# Patient Record
Sex: Female | Born: 1980 | Race: White | Hispanic: No | Smoking: Never smoker
Health system: Southern US, Community
[De-identification: ages and names within clinical notes are randomized; demographics above are authoritative.]

## PROBLEM LIST (undated history)

## (undated) DIAGNOSIS — M5136 Other intervertebral disc degeneration, lumbar region: Secondary | ICD-10-CM

## (undated) DIAGNOSIS — M51369 Other intervertebral disc degeneration, lumbar region without mention of lumbar back pain or lower extremity pain: Secondary | ICD-10-CM

## (undated) DIAGNOSIS — C539 Malignant neoplasm of cervix uteri, unspecified: Secondary | ICD-10-CM

## (undated) DIAGNOSIS — Z90711 Acquired absence of uterus with remaining cervical stump: Secondary | ICD-10-CM

## (undated) DIAGNOSIS — F319 Bipolar disorder, unspecified: Secondary | ICD-10-CM

## (undated) DIAGNOSIS — J449 Chronic obstructive pulmonary disease, unspecified: Secondary | ICD-10-CM

## (undated) DIAGNOSIS — N83299 Other ovarian cyst, unspecified side: Secondary | ICD-10-CM

## (undated) DIAGNOSIS — G5601 Carpal tunnel syndrome, right upper limb: Secondary | ICD-10-CM

## (undated) DIAGNOSIS — F4310 Post-traumatic stress disorder, unspecified: Secondary | ICD-10-CM

## (undated) DIAGNOSIS — T7840XA Allergy, unspecified, initial encounter: Secondary | ICD-10-CM

## (undated) DIAGNOSIS — N189 Chronic kidney disease, unspecified: Secondary | ICD-10-CM

## (undated) DIAGNOSIS — F329 Major depressive disorder, single episode, unspecified: Secondary | ICD-10-CM

## (undated) DIAGNOSIS — F419 Anxiety disorder, unspecified: Secondary | ICD-10-CM

## (undated) DIAGNOSIS — M722 Plantar fascial fibromatosis: Secondary | ICD-10-CM

## (undated) DIAGNOSIS — F32A Depression, unspecified: Secondary | ICD-10-CM

## (undated) DIAGNOSIS — G35 Multiple sclerosis: Secondary | ICD-10-CM

## (undated) DIAGNOSIS — M069 Rheumatoid arthritis, unspecified: Secondary | ICD-10-CM

## (undated) DIAGNOSIS — G8929 Other chronic pain: Secondary | ICD-10-CM

## (undated) DIAGNOSIS — M5126 Other intervertebral disc displacement, lumbar region: Secondary | ICD-10-CM

## (undated) DIAGNOSIS — I10 Essential (primary) hypertension: Secondary | ICD-10-CM

## (undated) DIAGNOSIS — M549 Dorsalgia, unspecified: Secondary | ICD-10-CM

## (undated) DIAGNOSIS — G43909 Migraine, unspecified, not intractable, without status migrainosus: Secondary | ICD-10-CM

## (undated) DIAGNOSIS — N289 Disorder of kidney and ureter, unspecified: Secondary | ICD-10-CM

## (undated) DIAGNOSIS — R87629 Unspecified abnormal cytological findings in specimens from vagina: Secondary | ICD-10-CM

## (undated) DIAGNOSIS — N760 Acute vaginitis: Principal | ICD-10-CM

## (undated) DIAGNOSIS — B9689 Other specified bacterial agents as the cause of diseases classified elsewhere: Secondary | ICD-10-CM

## (undated) HISTORY — DX: Allergy, unspecified, initial encounter: T78.40XA

## (undated) HISTORY — PX: EYE SURGERY: SHX253

## (undated) HISTORY — DX: Unspecified abnormal cytological findings in specimens from vagina: R87.629

## (undated) HISTORY — DX: Chronic kidney disease, unspecified: N18.9

## (undated) HISTORY — DX: Multiple sclerosis: G35

## (undated) HISTORY — DX: Other intervertebral disc degeneration, lumbar region without mention of lumbar back pain or lower extremity pain: M51.369

## (undated) HISTORY — DX: Acute vaginitis: N76.0

## (undated) HISTORY — DX: Disorder of kidney and ureter, unspecified: N28.9

## (undated) HISTORY — DX: Carpal tunnel syndrome, right upper limb: G56.01

## (undated) HISTORY — PX: ABDOMINAL HYSTERECTOMY: SHX81

## (undated) HISTORY — DX: Other intervertebral disc displacement, lumbar region: M51.26

## (undated) HISTORY — PX: OTHER SURGICAL HISTORY: SHX169

## (undated) HISTORY — DX: Other specified bacterial agents as the cause of diseases classified elsewhere: B96.89

## (undated) HISTORY — DX: Other intervertebral disc degeneration, lumbar region: M51.36

---

## 2001-03-02 ENCOUNTER — Emergency Department (HOSPITAL_COMMUNITY): Admission: EM | Admit: 2001-03-02 | Discharge: 2001-03-02 | Payer: Self-pay | Admitting: Emergency Medicine

## 2001-09-11 ENCOUNTER — Emergency Department (HOSPITAL_COMMUNITY): Admission: EM | Admit: 2001-09-11 | Discharge: 2001-09-12 | Payer: Self-pay | Admitting: Internal Medicine

## 2002-01-09 ENCOUNTER — Emergency Department (HOSPITAL_COMMUNITY): Admission: EM | Admit: 2002-01-09 | Discharge: 2002-01-09 | Payer: Self-pay | Admitting: Emergency Medicine

## 2002-07-24 ENCOUNTER — Emergency Department (HOSPITAL_COMMUNITY): Admission: EM | Admit: 2002-07-24 | Discharge: 2002-07-24 | Payer: Self-pay | Admitting: Emergency Medicine

## 2002-08-28 ENCOUNTER — Ambulatory Visit (HOSPITAL_COMMUNITY): Admission: AD | Admit: 2002-08-28 | Discharge: 2002-08-28 | Payer: Self-pay | Admitting: Obstetrics and Gynecology

## 2002-09-07 ENCOUNTER — Ambulatory Visit (HOSPITAL_COMMUNITY): Admission: AD | Admit: 2002-09-07 | Discharge: 2002-09-07 | Payer: Self-pay | Admitting: Obstetrics and Gynecology

## 2002-09-08 ENCOUNTER — Ambulatory Visit (HOSPITAL_COMMUNITY): Admission: AD | Admit: 2002-09-08 | Discharge: 2002-09-08 | Payer: Self-pay | Admitting: Obstetrics and Gynecology

## 2002-09-11 ENCOUNTER — Ambulatory Visit (HOSPITAL_COMMUNITY): Admission: AD | Admit: 2002-09-11 | Discharge: 2002-09-11 | Payer: Self-pay | Admitting: Obstetrics and Gynecology

## 2002-09-17 ENCOUNTER — Ambulatory Visit (HOSPITAL_COMMUNITY): Admission: RE | Admit: 2002-09-17 | Discharge: 2002-09-17 | Payer: Self-pay | Admitting: Obstetrics and Gynecology

## 2002-09-23 ENCOUNTER — Ambulatory Visit (HOSPITAL_COMMUNITY): Admission: AD | Admit: 2002-09-23 | Discharge: 2002-09-23 | Payer: Self-pay | Admitting: Obstetrics and Gynecology

## 2002-09-26 ENCOUNTER — Ambulatory Visit (HOSPITAL_COMMUNITY): Admission: AD | Admit: 2002-09-26 | Discharge: 2002-09-26 | Payer: Self-pay | Admitting: Obstetrics and Gynecology

## 2002-09-30 ENCOUNTER — Ambulatory Visit (HOSPITAL_COMMUNITY): Admission: AD | Admit: 2002-09-30 | Discharge: 2002-09-30 | Payer: Self-pay | Admitting: Obstetrics and Gynecology

## 2002-10-13 ENCOUNTER — Observation Stay (HOSPITAL_COMMUNITY): Admission: AD | Admit: 2002-10-13 | Discharge: 2002-10-14 | Payer: Self-pay | Admitting: Obstetrics and Gynecology

## 2002-10-14 ENCOUNTER — Ambulatory Visit (HOSPITAL_COMMUNITY): Admission: AD | Admit: 2002-10-14 | Discharge: 2002-10-15 | Payer: Self-pay | Admitting: Obstetrics and Gynecology

## 2002-10-17 ENCOUNTER — Ambulatory Visit (HOSPITAL_COMMUNITY): Admission: AD | Admit: 2002-10-17 | Discharge: 2002-10-17 | Payer: Self-pay | Admitting: Obstetrics and Gynecology

## 2002-10-20 ENCOUNTER — Inpatient Hospital Stay (HOSPITAL_COMMUNITY): Admission: AD | Admit: 2002-10-20 | Discharge: 2002-10-22 | Payer: Self-pay | Admitting: Obstetrics and Gynecology

## 2003-06-17 ENCOUNTER — Emergency Department (HOSPITAL_COMMUNITY): Admission: EM | Admit: 2003-06-17 | Discharge: 2003-06-17 | Payer: Self-pay | Admitting: Emergency Medicine

## 2003-07-18 ENCOUNTER — Emergency Department (HOSPITAL_COMMUNITY): Admission: EM | Admit: 2003-07-18 | Discharge: 2003-07-18 | Payer: Self-pay | Admitting: *Deleted

## 2003-11-17 ENCOUNTER — Emergency Department (HOSPITAL_COMMUNITY): Admission: EM | Admit: 2003-11-17 | Discharge: 2003-11-17 | Payer: Self-pay | Admitting: *Deleted

## 2004-01-31 ENCOUNTER — Observation Stay (HOSPITAL_COMMUNITY): Admission: AD | Admit: 2004-01-31 | Discharge: 2004-02-01 | Payer: Self-pay | Admitting: Obstetrics and Gynecology

## 2004-02-15 ENCOUNTER — Inpatient Hospital Stay (HOSPITAL_COMMUNITY): Admission: AD | Admit: 2004-02-15 | Discharge: 2004-02-17 | Payer: Self-pay | Admitting: Obstetrics and Gynecology

## 2004-03-28 ENCOUNTER — Ambulatory Visit (HOSPITAL_COMMUNITY): Admission: RE | Admit: 2004-03-28 | Discharge: 2004-03-28 | Payer: Self-pay | Admitting: Obstetrics and Gynecology

## 2004-03-28 ENCOUNTER — Emergency Department (HOSPITAL_COMMUNITY): Admission: EM | Admit: 2004-03-28 | Discharge: 2004-03-28 | Payer: Self-pay | Admitting: Emergency Medicine

## 2004-05-05 ENCOUNTER — Emergency Department (HOSPITAL_COMMUNITY): Admission: EM | Admit: 2004-05-05 | Discharge: 2004-05-05 | Payer: Self-pay | Admitting: Emergency Medicine

## 2005-06-06 ENCOUNTER — Other Ambulatory Visit: Admission: RE | Admit: 2005-06-06 | Discharge: 2005-06-06 | Payer: Self-pay

## 2006-10-03 ENCOUNTER — Ambulatory Visit (HOSPITAL_COMMUNITY): Admission: RE | Admit: 2006-10-03 | Discharge: 2006-10-03 | Payer: Self-pay | Admitting: Family Medicine

## 2006-10-31 ENCOUNTER — Emergency Department (HOSPITAL_COMMUNITY): Admission: EM | Admit: 2006-10-31 | Discharge: 2006-10-31 | Payer: Self-pay | Admitting: Emergency Medicine

## 2008-07-15 ENCOUNTER — Emergency Department (HOSPITAL_COMMUNITY): Admission: EM | Admit: 2008-07-15 | Discharge: 2008-07-15 | Payer: Self-pay | Admitting: Emergency Medicine

## 2008-09-16 ENCOUNTER — Emergency Department (HOSPITAL_COMMUNITY): Admission: EM | Admit: 2008-09-16 | Discharge: 2008-09-16 | Payer: Self-pay | Admitting: Emergency Medicine

## 2009-10-11 ENCOUNTER — Emergency Department (HOSPITAL_COMMUNITY): Admission: EM | Admit: 2009-10-11 | Discharge: 2009-10-12 | Payer: Self-pay | Admitting: Emergency Medicine

## 2009-11-15 ENCOUNTER — Ambulatory Visit: Admission: RE | Admit: 2009-11-15 | Discharge: 2009-11-15 | Payer: Self-pay | Admitting: Family Medicine

## 2009-11-23 DIAGNOSIS — J45909 Unspecified asthma, uncomplicated: Secondary | ICD-10-CM | POA: Insufficient documentation

## 2009-11-26 ENCOUNTER — Ambulatory Visit: Payer: Self-pay | Admitting: Cardiovascular Disease

## 2009-11-26 DIAGNOSIS — R0602 Shortness of breath: Secondary | ICD-10-CM | POA: Insufficient documentation

## 2009-11-26 DIAGNOSIS — R0609 Other forms of dyspnea: Secondary | ICD-10-CM | POA: Insufficient documentation

## 2009-11-30 ENCOUNTER — Encounter: Payer: Self-pay | Admitting: Cardiology

## 2009-12-06 ENCOUNTER — Ambulatory Visit (HOSPITAL_COMMUNITY): Admission: RE | Admit: 2009-12-06 | Discharge: 2009-12-06 | Payer: Self-pay | Admitting: Cardiovascular Disease

## 2009-12-06 ENCOUNTER — Telehealth (INDEPENDENT_AMBULATORY_CARE_PROVIDER_SITE_OTHER): Payer: Self-pay | Admitting: *Deleted

## 2009-12-09 ENCOUNTER — Ambulatory Visit: Payer: Self-pay | Admitting: Cardiology

## 2009-12-09 ENCOUNTER — Encounter (HOSPITAL_COMMUNITY): Admission: RE | Admit: 2009-12-09 | Discharge: 2010-01-08 | Payer: Self-pay | Admitting: Cardiovascular Disease

## 2009-12-13 ENCOUNTER — Encounter: Payer: Self-pay | Admitting: Cardiovascular Disease

## 2010-09-20 NOTE — Letter (Signed)
Summary: [facility]  [facility]   Imported By: Faythe Ghee 11/30/2009 13:20:37  _____________________________________________________________________  External Attachment:    Type:   Image     Comment:   External Document

## 2010-09-20 NOTE — Letter (Signed)
Summary: [facility] PROGRESS NOTE  [facility] PROGRESS NOTE   Imported By: Faythe Ghee 11/30/2009 13:19:27  _____________________________________________________________________  External Attachment:    Type:   Image     Comment:   External Document

## 2010-09-20 NOTE — Letter (Signed)
Summary: [place] Results Engineer, agricultural at [facility]  [address] [place] [address]   Phone: [phone]  Fax: [phone]      December 13, 2009 MRN: [id number]   Mary [name] [address] [place], [place]  [address]   Dear [name],  Your test ordered by Selena Batten has been reviewed by your physician (or physician assistant) and was found to be normal or stable. Your physician (or physician assistant) felt no changes were needed at this time.  ____ Echocardiogram  __X__ Cardiac Stress Test  ____ Lab Work  ____ Peripheral vascular study of arms, legs or neck  ____ CT scan or X-ray  ____ Lung or Breathing test  ____ Other: Please continue on current medical treatment.  Thank you.   Charlton Haws, MD, F.A.C.C

## 2010-09-20 NOTE — Progress Notes (Signed)
Summary: Test results  Phone Note Call from Patient   Caller: Patient Reason for Call: Talk to Nurse Action Taken: Phone Call Completed Details for Reason: calling to get results from test this morning. Details of Action Taken: sent note to [facility], LPN Summary of Call: Mary [name] called in to get resuts from test that was completed this morning. Initial call taken by: Alexis Goodell     Appended Document: Test results    Phone Note Outgoing Call   Call placed by: Larita Fife Via LPN,  December 07, 2009 9:12 AM Summary of Call: Pt. advised and test results sent to Dr. Christell Constant.

## 2010-09-20 NOTE — Letter (Signed)
Summary: [place] [facility] (Nuc Med Stress)  [facility]  [address] [place] [address]   Phone: [phone]  Fax: [phone]    Nuclear Medicine 1-Day Stress Test Information Sheet  Re:     Mary [name]   DOB:     [birth year] MRN:     [id number] Weight:  Appointment Date: Register at: Appointment Time: Referring MD:  _X__Exercise Stress  __Adenosine   __Dobutamine  __Lexiscan  __Persantine   __Thallium  Urgency: ____1 (next day)   ____2 (one week)    ____3 (PRN)  Patient will receive Follow Up call with results: Patient needs follow-up appointment:  Instructions regarding medication:  How to prepare for your stress test: 1. DO NOT eat or dring 6 hours prior to your arrival time. This includes no caffeine (coffee, tea, sodas, chocolate) if you were instructed to take your medications, drink water with it. 2. DO NOT use any tobacco products for at leaset 8 hours prior to arrival. 3. DO NOT wear dresses or any clothing that may have metal clasps or buttons. 4. Wear short sleeve shirts, loose clothing, and comfortalbe walking shoes. 5. DO NOT use lotions, oils or powder on your chest before the test. 6. The test will take approximately 3-4 hours from the time you arrive until completion. 7. To register the day of the test, go to the Short Stay entrance at [facility]. 8. If you must cancel your test, call [phone] as soon as you are aware.  After you arrive for test:   When you arrive at [facility], you will go to Short Stay to be registered. They will then send you to Radiology to check in. The Nuclear Medicine Tech will get you and start an IV in your arm or hand. A small amount of a radioactive tracer will then be injected into your IV. This tracer will then have to circulate for 30-45 minutes. During this time you will wait in the waiting room and you will be able to drink something without caffeine. A series of pictures will be  taken of your heart follwoing this waiting period. After the 1st set of pictures you will go to the stress lab to get ready for your stress test. During the stress test, another small amount of a radioactive tracer will be injected through your IV. When the stress test is complete, there is a short rest period while your heart rate and blood pressure will be monitored. When this monitoring period is complete you will have another set of pictrues taken. (The same as the 1st set of pictures). These pictures are taken between 15 minutes and 1 hour after the stress test. The time depends on the type of stress test you had. Your doctor will inform you of your test results within 7 days after test.    The possibilities of certain changes are possible during the test. They include abnormal blood pressure and disorders of the heart. Side effects of persantine or adenosine can include flushing, chest pain, shortness of breath, stomach tightness, headache and light-headedness. These side effects usually do not last long and are self-resolving. Every effort will be made to keep you comfortable and to minimize complications by obtaining a medical history and by close observation during the test. Emergency equipment, medications, and trained personnel are available to deal with any unusual situation which may arise.  Please notify office at least 48 hours in advance if you are unable to keep  this appt.

## 2010-09-20 NOTE — Assessment & Plan Note (Signed)
Summary: **np3 chest pain   Visit Type:  Initial Consult Primary Provider:  Dr.donald moore  CC:  chest pain.  History of Present Illness: Mary [name] is seen today at the request of Dr Buel Ream office.  She has had atypical SSCP for a few months.  The pain is sharp in center of her chest, occurs at rest and not necessarily walking.  She had the pain prior to her hysterectomy 4 weeks ago at [facility].  It is persistant since surgery except the last 3 days.  She has longstanding asthma which is not necessarily worse with the chest pain.  She indicates that her ECG was abnormal at Dr Buel Ream office but she had her surgery anyway.  ECG in the office today shows NSR with nonspecific ST/T wave changes.    ECG:  NSR 65 Nonspecific ST/T wave changes Abnormal ECG  Current Problems (verified): 1)  Dyspnea  (ICD-786.05) 2)  R/O Chest Pain Unspecified  (ICD-786.50) 3)  Asthma  (ICD-493.90)  Current Medications (verified): 1)  Advair Diskus 250-50 Mcg/dose Aepb (Fluticasone-Salmeterol) .Marland Kitchen.. 1 Puff Two Times A Day 2)  Ventolin Hfa 108 (90 Base) Mcg/act Aers (Albuterol Sulfate) .... 2 Puffs Qid 3)  Singulair 10 Mg Tabs (Montelukast Sodium) .... Take 1 Tab Daily 4)  Alprazolam 1 Mg Tabs (Alprazolam) .... Take 1/2 Tab Two Times A Day 5)  Vitamin D3 400 Unit Tabs (Cholecalciferol) .... Take 1 Tab Daily 6)  Bupropion Hcl 150 Mg Xr12h-Tab (Bupropion Hcl) .... Take 1 Tab Two Times A Day 7)  Clarinex 5 Mg Tabs (Desloratadine) .... Take 1 Tab Daily 8)  Proair Hfa 108 (90 Base) Mcg/act Aers (Albuterol Sulfate) .... Prn  Allergies (verified): 1)  ! Penicillin  Past History:  Past Medical History: Last updated: 11/23/2009 Current Problems:  ASTHMA (ICD-493.90)  Past Surgical History: Last updated: 11/23/2009 Artificial Right Eye 2 normal deliveries 1 boy 1 girl  Family History: Last updated: 11/26/2009 non-contributory  Social History: Last updated:  11/26/2009 Sedentary Nonsmoker Nondrinker Single  Family History: non-contributory  Social History: Sedentary Nonsmoker Nondrinker Single  Review of Systems       Denies fever, malais, weight loss, blurry vision, decreased visual acuity, cough, sputum, SOB, hemoptysis, pleuritic pain, palpitaitons, heartburn, abdominal pain, melena, lower extremity edema, claudication, or rash.   Vital Signs:  Patient profile:   30 year old female Height:      67 inches Weight:      274 pounds BMI:     43.07 Pulse rate:   66 / minute BP sitting:   126 / 87  (right arm)  Vitals Entered By: Dreama Saa, CNA (November 26, 2009 3:52 PM)  Physical Exam  General:  Affect appropriate Healthy:  appears stated age HEENT: normal Neck supple with no adenopathy JVP normal no bruits no thyromegaly Lungs clear with no wheezing and good diaphragmatic motion Heart:  S1/S2 no murmur,rub, gallop or click PMI normal Abdomen: benighn, BS positve, no tenderness, no AAA no bruit.  No HSM or HJR Distal pulses intact with no bruits No edema Neuro non-focal Skin warm and dry Right eye enucleatoin with prosthesis   Impression & Recommendations:  Problem # 1:  DYSPNEA (ICD-786.05)  Obesity with asthma.  Check CT R/O PE post Ob/Gyn surgery with SSCP and SOB  Orders: Nuclear Stress Test (Nuc Stress Test)  Problem # 2:  R/O CHEST PAIN UNSPECIFIED (ICD-786.50)  Atypical with abnormal ECG.  Stess Myovue  Orders: Nuclear Stress Test (Nuc Stress Test)  Other Orders:  CT Scan  (CT Scan)  Patient Instructions: 1)  Your physician recommends that you schedule a follow-up appointment in: post test 2)  Your physician has requested that you have an exercise stress myoview.  For further information please visit [email or web address]  Please follow instruction sheet, as given. 3)  Your physician has requested that you have a cardiac CT.  Cardiac computed tomography (CT) is a painless test that uses an x-ray  machine to take clear, detailed pictures of your heart.  For further information please visit [email or web address]  Please follow instruction sheet as given.

## 2010-09-20 NOTE — Letter (Signed)
Summary: [place] Results Engineer, agricultural at [facility]  [address], [place] [address]   Phone: [phone]  Fax: [phone]      December 13, 2009 MRN: [id number]   Mary [name] [address] [place], [place]  [address]   Dear Ms. Moultry,  Your test ordered by Selena Batten has been reviewed by your physician (or physician assistant) and was found to be normal or stable. Your physician (or physician assistant) felt no changes were needed at this time.  ____ Echocardiogram  __X__ Cardiac Stress Test  ____ Lab Work  ____ Peripheral vascular study of arms, legs or neck  ____ CT scan or X-ray  ____ Lung or Breathing test  ____ Other: Please continue with current medical treatment.   Thank you.   Charlton Haws, MD, F.A.C.C

## 2010-09-20 NOTE — Letter (Signed)
Summary: [facility]S [facility].11 [facility]  [facility]   Imported By: Faythe Ghee 11/30/2009 13:20:08  _____________________________________________________________________  External Attachment:    Type:   Image     Comment:   External Document

## 2010-10-25 ENCOUNTER — Other Ambulatory Visit (HOSPITAL_COMMUNITY): Payer: Self-pay | Admitting: *Deleted

## 2010-10-25 DIAGNOSIS — N6452 Nipple discharge: Secondary | ICD-10-CM

## 2010-10-25 DIAGNOSIS — R52 Pain, unspecified: Secondary | ICD-10-CM

## 2010-10-26 ENCOUNTER — Ambulatory Visit (HOSPITAL_COMMUNITY)
Admission: RE | Admit: 2010-10-26 | Discharge: 2010-10-26 | Disposition: A | Discharge status: Home / Self Care | Payer: PRIVATE HEALTH INSURANCE | Source: Ambulatory Visit | Attending: Family Medicine | Admitting: Family Medicine

## 2010-10-26 ENCOUNTER — Encounter (HOSPITAL_COMMUNITY): Payer: PRIVATE HEALTH INSURANCE

## 2010-10-26 ENCOUNTER — Other Ambulatory Visit (HOSPITAL_COMMUNITY): Payer: Self-pay | Admitting: *Deleted

## 2010-10-26 ENCOUNTER — Ambulatory Visit (HOSPITAL_COMMUNITY)
Admission: RE | Admit: 2010-10-26 | Discharge: 2010-10-26 | Disposition: A | Discharge status: Home / Self Care | Payer: PRIVATE HEALTH INSURANCE | Source: Ambulatory Visit | Attending: *Deleted | Admitting: *Deleted

## 2010-10-26 DIAGNOSIS — R52 Pain, unspecified: Secondary | ICD-10-CM

## 2010-10-26 DIAGNOSIS — N644 Mastodynia: Secondary | ICD-10-CM | POA: Insufficient documentation

## 2010-10-26 DIAGNOSIS — N6459 Other signs and symptoms in breast: Secondary | ICD-10-CM | POA: Insufficient documentation

## 2010-10-26 DIAGNOSIS — N6452 Nipple discharge: Secondary | ICD-10-CM

## 2010-10-31 ENCOUNTER — Other Ambulatory Visit: Payer: Self-pay

## 2010-10-31 DIAGNOSIS — E049 Nontoxic goiter, unspecified: Secondary | ICD-10-CM

## 2010-11-03 ENCOUNTER — Ambulatory Visit (HOSPITAL_COMMUNITY)
Admission: RE | Admit: 2010-11-03 | Discharge: 2010-11-03 | Disposition: A | Discharge status: Home / Self Care | Payer: PRIVATE HEALTH INSURANCE | Source: Ambulatory Visit | Attending: Family Medicine | Admitting: Family Medicine

## 2010-11-03 DIAGNOSIS — Z808 Family history of malignant neoplasm of other organs or systems: Secondary | ICD-10-CM | POA: Insufficient documentation

## 2010-11-03 DIAGNOSIS — R599 Enlarged lymph nodes, unspecified: Secondary | ICD-10-CM | POA: Insufficient documentation

## 2010-11-03 DIAGNOSIS — E049 Nontoxic goiter, unspecified: Secondary | ICD-10-CM | POA: Insufficient documentation

## 2011-01-06 NOTE — Op Note (Signed)
NAME:  Mary [name], Mary [name]                      ACCOUNT NO.:  [id number]   MEDICAL RECORD NO.:  [id number]                   PATIENT TYPE:  INP   LOCATION:  LDR1                                 FACILITY:  [facility]   PHYSICIAN:  Tilda Burrow, M.D.              DATE OF BIRTH:  1980-09-20   DATE OF PROCEDURE:  DATE OF DISCHARGE:                                 OPERATIVE REPORT   LABOR COURSE:  Mary [name] had approximately six minutes of bradycardia with a  fetal heart rate down in the 60s which responded slowly to hands, knee-chest  position.  She had O2 via face mask and IV bolus running plus an  amnioinfusion that had been placed a few minutes prior by Dr. Despina Hidden.  The  fetal heart rate responded to vigorous scalp stimulation and manipulation of  the baby externally.  However, she did continue to have deep repetitive  decelerations.  She was progressing very rapidly with fast descent, and the  cervix was pushed over the baby's head when she was 8 cm dilated probably  less than 5 minutes after arrival to the labor and delivery unit.  Over two  contractions in a knee-chest position, she delivered a viable female infant  at 10:26.  There was a nuchal cord which was reduced.  Apgars were 9 and 9.  Weight 6 pounds 3 ounces.  Pitocin, 20 units, diluted in 1000 cc of lactated  Ringer's was infused rapidly IV.  The placenta separated spontaneously and  was delivered via controlled cord traction at 10:35.  It was inspected and  appeared to be intact with a three-vessel cord.  I forgot to draw blood from  the cord; therefore, cord ABGs will not be available.  The vagina was then  inspected, and no lacerations were found.  Estimated blood loss was 500 cc.  The patient tolerated the delivery very well.     ________________________________________  ___________________________________________  Jacklyn Shell, C.N.M.           Tilda Burrow, M.D.   FC/MEDQ  D:  02/16/2004  T:  02/16/2004   Job:  [id number]   cc:   [facility]

## 2011-01-06 NOTE — H&P (Signed)
NAME:  Mary [name], Mary [name]                      ACCOUNT NO.:  [id number]   MEDICAL RECORD NO.:  [id number]                  PATIENT TYPE:   LOCATION:                                       FACILITY:  [facility]   PHYSICIAN:  Tilda Burrow, M.D.              DATE OF BIRTH:  1981/07/12   DATE OF ADMISSION:  02/15/2004  DATE OF DISCHARGE:                                HISTORY & PHYSICAL   CHIEF COMPLAINT:  Elective induction of labor at term due to extreme  maternal discomfort and fatigue.   HISTORY OF PRESENT ILLNESS:  Mary [name] is a 30 year old gravida 3, para 2 with  an EDC of February 22, 2004, based on her last menstrual period and June 28 and  26, based on first and second trimester ultrasounds, placing her at 61-[redacted]  weeks gestational age.  She began prenatal care early in the first trimester  and has had regular visits.  Total weight gain has been a loss of 2 pounds;  however, she has had appropriate fundal height growth, and she has always  lost some weight during her pregnancies.  Weight today was 243.   Blood pressures 100s-120s/60s-70s.  Blood type A-.  Rubella immune.  HSB,  SAG, HIV, RPR, gonorrhea and Chlamydia are all negative.  GBS was also  negative.  She had a normal one-hour GTT at 74.   Prenatal course has basically been uneventful.  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  We will recheck that three months postpartum.   PAST MEDICAL HISTORY:  1. Has an artificial right eye.  2. Asthma.  Uses inhaler occasionally.   Negative surgical history.   ALLERGIES:  PENICILLIN.   FAMILY HISTORY:  Positive for diabetes, Bell's palsy.   OB HISTORY:  Had a vaginal delivery in 1995 at 40 weeks with an 8 pound, 1  ounce female with an epidural.  In March, 2004 had a vaginal delivery without  an epidural of an 8 pound, 1 ounce female.   PHYSICAL EXAMINATION:  HEENT:  Artificial right eye.  LUNGS:  Clear respirations.  HEART:  Regular rate and rhythm.  ABDOMEN:  Soft and  nontender.  Fundal height 37 cm.  Estimated fetal weight  7-1/2 to 8 pounds.  CERVICAL:  Cervix is 1-2.  Soft.  A -2 station with vertex presentation.  EXTREMITIES:  Legs are trace edema.   IMPRESSION:  Intrauterine pregnancy at 39-40 weeks.  Selective induction of  labor secondary to extreme maternal discomfort.   PLAN:  We will admit to the birthing center on Monday, June 27, for Foley,  preinduction, and planned artificial rupture of membranes of Pitocin in the  morning.  The patient does not plan on epidural at this time.     _____________________________________  ___________________________________________  Jacklyn Shell, C.N.M.        Tilda Burrow, M.D.   FC/MEDQ  D:  02/12/2004  T:  02/12/2004  Job:  [id number]

## 2011-01-06 NOTE — Op Note (Signed)
NAME:  Mary [name], Mary [name]                      ACCOUNT NO.:  [id number]   MEDICAL RECORD NO.:  [id number]                   PATIENT TYPE:  AMB   LOCATION:  DAY                                  FACILITY:  [facility]   PHYSICIAN:  Tilda Burrow, M.D.              DATE OF BIRTH:  Jul 06, 1981   DATE OF PROCEDURE:  03/28/2004  DATE OF DISCHARGE:  03/28/2004                                 OPERATIVE REPORT   PREOPERATIVE DIAGNOSIS:  Elective sterilization.   POSTOPERATIVE DIAGNOSIS:  Elective sterilization.   PROCEDURE:  Laparoscopic tubal sterilization with Falope rings.   SURGEON:  Christin Bach, M.D.   ASSISTANT:  None.   ANESTHESIA:  General.   COMPLICATIONS:  None.   ESTIMATED BLOOD LOSS:  Minimal.   DETAILS OF PROCEDURE:  The patient was taken to the operating room, prepped  and draped in the usual standard fashion with legs in low lithotomy leg  supports after general anesthesia was introduced without difficulty.  The  bladder was in-and-out catheterized and Hulka tenaculum attached to the  cervix for uterine  manipulation.  An infraumbilical, vertical, 1-cm, skin  incision was made as well as a transverse suprapubic 1-cm incision. A Veress  needle was used to achieve pneumoperitoneum through the umbilical incision  while being careful to orient the needle toward the pelvis while elevating  the abdominal wall by manual elevation. Water droplet test was used to  confirm intraperitoneal placement.   Pneumoperitoneum was achieved easily under 8-to-10 mm of intra-abdominal  pressure; and the laparoscopic trocar was introduced, a 5-mm blunt tipped  trocar, under direct visualization using the video camera.  Peritoneal  cavity was entered without difficulty.  Inspection of the anterior surfaces  of the abdominal contents showed no evidence of injury or bleeding.  Attention was directed to the pelvis.  Findings were as described above.   Attention was first directed to the left  fallopian tube which was elevated,  identified to its fimbriated end and grasped in its midportion with Falope  ring applier.  Falope ring applied and then the tube infiltrated with  Marcaine solution 0.25% using a 22-gauge spinal needle percutaneously  applied.   Attention was then directed to the right fallopian tube where a similar  procedure was performed.  Photo documentation of the ring placements was  performed; 120 cc of saline was instilled into the abdomen; deflation of  CO2 performed; instruments removed and subcuticular 4-0 Dexon closure of  skin incisions performed.  The rest of the surgical instruments were  removed; Steri-Strips placed.   The patient was allowed to awaken and go to recovery room in standard  fashion.      ___________________________________________  Tilda Burrow, M.D.   JVF/MEDQ  D:  04/14/2004  T:  04/15/2004  Job:  [id number]

## 2011-01-06 NOTE — H&P (Signed)
[name], [name]                        ACCOUNT NO.:  [id number]   MEDICAL RECORD NO.:  [id number]                  PATIENT TYPE:   LOCATION:                                       FACILITY:   PHYSICIAN:  Jacklyn Shell, C.N.M.    DATE OF BIRTH:   DATE OF ADMISSION:  10/20/2002  DATE OF DISCHARGE:                                HISTORY & PHYSICAL   CHIEF COMPLAINT:  Induction of labor secondary to extreme maternal  discomfort.   HISTORY OF PRESENT ILLNESS:  The patient is a 30 year old gravida 2, para 1,  with an EDC of 10/18/2002 based on first and second trimester ultrasound,  placing her at 40 weeks and 1 day gestational age.  She began prenatal care  at approximately 13 weeks and has have very regular visits throughout.  Blood pressure has ranged 100 to 130s/60s to 80s.  Total weight gain 20  pounds with appropriate fundal height growth.   PRENATAL LABORATORY DATA:  Blood type A negative.  Rubella is immune. RPR,  HBsAg, HIV are all negative. Group B strep is negative.  One-hour GTT was  normal at 67.   The prenatal course has been essentially uneventful except for multiple  visits with complaints of aches, pains, occasional contractions beginning at  approximately 27 weeks without cervical change or need for tocolysis.   HISTORY OF PRESENT ILLNESS:  Positive for headaches, positive for right eye  enucleated as a child with a prosthesis.   PAST SURGICAL HISTORY:  No history of blood transfusion or anesthesia  complications.   PAST OBSTETRICAL HISTORY:  Spontaneous vaginal delivery of 8 pound 11 ounce  female in 1999 at [facility] by Dr. Emelda Fear.   FAMILY HISTORY:  Positive for hypertension, coronary artery disease, and  diabetes.   SOCIAL HISTORY:  Denies smoking, drugs, or alcohol use.  The patient is  unemployed.  The father of the baby is not involved at this time.   PHYSICAL EXAMINATION:  HEENT:  Right eye as stated before.  HEART:  Regular rate  and rhythm.  LUNGS:  Clear.  ABDOMEN:  Soft, nontender.  Fundal height approximately 40 cm.  PELVIC:  Vertex presentation.  Cervix is a type 2 posterior, 60% effaced, -2  station.  EXTREMITIES:  There is 2 to 3+ pretibial edema with 1+ DTRs.   IMPRESSION:  1. Intrauterine pregnancy at 40 weeks.  2. Elective induction of labor due to maternal request because of extreme     maternal discomfort.   Risks and benefits of induction have been explained at length to the patient  including the same risks involved in induction as in spontaneous labor.  She  accepts these risks.   PLAN:  Follow on 10/20/2002 with Pitocin augmentation in the morning.  Jacklyn Shell, C.N.M.    FC/MEDQ  D:  10/20/2002  T:  10/20/2002  Job:  [id number]   cc:   [facility]

## 2011-01-06 NOTE — Op Note (Signed)
   NAME:  CAMBRYN, CHARTERS                      ACCOUNT NO.:  [id number]   MEDICAL RECORD NO.:  [id number]                   PATIENT TYPE:  INP   LOCATION:  LDR2                                 FACILITY:  [facility]   PHYSICIAN:  Jacklyn Shell, C.N.M.    DATE OF BIRTH:  1981-01-31   DATE OF PROCEDURE:  DATE OF DISCHARGE:                                 OPERATIVE REPORT   PROCEDURE:  Delivery.   DESCRIPTION OF PROCEDURE:  Clarissia progressed nicely through labor and had a  sudden irresistible urge to push at approximately 9 cm. As I arrived on  Labor and Delivery, she had an anterior lip which was easily reduced over  one push. After a five minute second stage, she delivered a viable female  infant at 15:38.  A nuchal cord was reduced, the shoulders were somewhat  tight coming out so some axilla traction on the left (anterior) arm was used  rotating the baby into an oblique position which expedited delivery. The  baby was very limp and apneic at delivery so the cord was doubly clamped and  cut in a rapid fashion and taken to the warmer. Apgar are 4 & 9. Please see  resuscitation note on the infants chart. Weight 8 pounds, 2 ounces. 20 units  of Pitocin dilated in 1000 mL of lactated Ringer's was then infused rapidly  IV. The placenta separated spontaneously and was delivered via maternal  pushing effort and controlled cord traction at 15:50. It was inspected and  appears to be intact with a 3 vessel cord. The fundus was firm and bleeding  was minimalized after some fundal massage. Estimated blood loss 400 mL. The  vagina was then inspected and is intact. The patient bonded well and did a  very nice job.                                               Jacklyn Shell, C.N.M.    FC/MEDQ  D:  10/21/2002  T:  10/21/2002  Job:  [id number]

## 2011-02-20 ENCOUNTER — Emergency Department (HOSPITAL_COMMUNITY): Payer: Self-pay

## 2011-02-20 ENCOUNTER — Emergency Department (HOSPITAL_COMMUNITY)
Admission: EM | Admit: 2011-02-20 | Discharge: 2011-02-20 | Disposition: A | Discharge status: Home / Self Care | Payer: Self-pay | Attending: Emergency Medicine | Admitting: Emergency Medicine

## 2011-02-20 DIAGNOSIS — G43909 Migraine, unspecified, not intractable, without status migrainosus: Secondary | ICD-10-CM | POA: Insufficient documentation

## 2011-02-20 DIAGNOSIS — Z79899 Other long term (current) drug therapy: Secondary | ICD-10-CM | POA: Insufficient documentation

## 2011-02-20 DIAGNOSIS — Z970 Presence of artificial eye: Secondary | ICD-10-CM | POA: Insufficient documentation

## 2011-02-20 DIAGNOSIS — F3289 Other specified depressive episodes: Secondary | ICD-10-CM | POA: Insufficient documentation

## 2011-02-20 DIAGNOSIS — Z8541 Personal history of malignant neoplasm of cervix uteri: Secondary | ICD-10-CM | POA: Insufficient documentation

## 2011-02-20 DIAGNOSIS — F329 Major depressive disorder, single episode, unspecified: Secondary | ICD-10-CM | POA: Insufficient documentation

## 2011-02-20 DIAGNOSIS — J4489 Other specified chronic obstructive pulmonary disease: Secondary | ICD-10-CM | POA: Insufficient documentation

## 2011-02-20 DIAGNOSIS — J449 Chronic obstructive pulmonary disease, unspecified: Secondary | ICD-10-CM | POA: Insufficient documentation

## 2011-05-10 ENCOUNTER — Emergency Department (HOSPITAL_COMMUNITY): Payer: PRIVATE HEALTH INSURANCE

## 2011-05-10 ENCOUNTER — Encounter: Payer: Self-pay | Admitting: *Deleted

## 2011-05-10 ENCOUNTER — Emergency Department (HOSPITAL_COMMUNITY)
Admission: EM | Admit: 2011-05-10 | Discharge: 2011-05-11 | Disposition: A | Discharge status: Home / Self Care | Payer: PRIVATE HEALTH INSURANCE | Attending: Emergency Medicine | Admitting: Emergency Medicine

## 2011-05-10 DIAGNOSIS — Z880 Allergy status to penicillin: Secondary | ICD-10-CM | POA: Insufficient documentation

## 2011-05-10 DIAGNOSIS — R109 Unspecified abdominal pain: Secondary | ICD-10-CM | POA: Insufficient documentation

## 2011-05-10 DIAGNOSIS — R10811 Right upper quadrant abdominal tenderness: Secondary | ICD-10-CM | POA: Insufficient documentation

## 2011-05-10 DIAGNOSIS — Z8541 Personal history of malignant neoplasm of cervix uteri: Secondary | ICD-10-CM | POA: Insufficient documentation

## 2011-05-10 HISTORY — DX: Acquired absence of uterus with remaining cervical stump: Z90.711

## 2011-05-10 HISTORY — DX: Malignant neoplasm of cervix uteri, unspecified: C53.9

## 2011-05-10 LAB — BASIC METABOLIC PANEL
BUN: 8 mg/dL (ref 6–23)
CO2: 31 mEq/L (ref 19–32)
Calcium: 9.9 mg/dL (ref 8.4–10.5)
Chloride: 102 mEq/L (ref 96–112)
Creatinine, Ser: 0.78 mg/dL (ref 0.50–1.10)
GFR calc Af Amer: >60 mL/min (ref >60)
GFR calc non Af Amer: >60 mL/min (ref >60)
Glucose, Bld: 88 mg/dL (ref 70–99)
Potassium: 3.9 mEq/L (ref 3.5–5.1)
Sodium: 139 mEq/L (ref 135–145)

## 2011-05-10 LAB — CBC
HCT: 37.8 % (ref 36.0–46.0)
Hemoglobin: 12.9 g/dL (ref 12.0–15.0)
MCH: 30.1 pg (ref 26.0–34.0)
MCHC: 34.1 g/dL (ref 30.0–36.0)
MCV: 88.1 fL (ref 78.0–100.0)
Platelets: 205 10*3/uL (ref 150–400)
RBC: 4.29 MIL/uL (ref 3.87–5.11)
RDW: 13.4 % (ref 11.5–15.5)
WBC: 10 10*3/uL (ref 4.0–10.5)

## 2011-05-10 LAB — HEPATIC FUNCTION PANEL
ALT: 16 U/L (ref 0–35)
AST: 15 U/L (ref 0–37)
Albumin: 4.2 g/dL (ref 3.5–5.2)
Alkaline Phosphatase: 99 U/L (ref 39–117)
Bilirubin, Direct: 0.2 mg/dL (ref 0.0–0.3)
Indirect Bilirubin: 0.5 mg/dL (ref 0.3–0.9)
Total Bilirubin: 0.7 mg/dL (ref 0.3–1.2)
Total Protein: 7.5 g/dL (ref 6.0–8.3)

## 2011-05-10 LAB — DIFFERENTIAL
Basophils Absolute: 0 10*3/uL (ref 0.0–0.1)
Basophils Relative: 0 % (ref 0–1)
Eosinophils Absolute: 0.1 10*3/uL (ref 0.0–0.7)
Eosinophils Relative: 1 % (ref 0–5)
Lymphocytes Relative: 23 % (ref 12–46)
Lymphs Abs: 2.3 10*3/uL (ref 0.7–4.0)
Monocytes Absolute: 0.5 10*3/uL (ref 0.1–1.0)
Monocytes Relative: 5 % (ref 3–12)
Neutro Abs: 7 10*3/uL (ref 1.7–7.7)
Neutrophils Relative %: 70 % (ref 43–77)

## 2011-05-10 LAB — URINALYSIS, ROUTINE W REFLEX MICROSCOPIC
Bilirubin Urine: NEGATIVE
Glucose, UA: NEGATIVE mg/dL
Hgb urine dipstick: NEGATIVE
Ketones, ur: NEGATIVE mg/dL
Leukocytes, UA: NEGATIVE
Nitrite: NEGATIVE
Protein, ur: NEGATIVE mg/dL
Specific Gravity, Urine: 1.02 (ref 1.005–1.030)
Urobilinogen, UA: 0.2 mg/dL (ref 0.0–1.0)
pH: 7 (ref 5.0–8.0)

## 2011-05-10 LAB — LIPASE, BLOOD: Lipase: 55 U/L (ref 11–59)

## 2011-05-10 MED ORDER — DIPHENHYDRAMINE HCL 25 MG PO CAPS
25.0000 mg | ORAL_CAPSULE | Freq: Once | ORAL | Status: AC
  Administered 2011-05-10: 25 mg via ORAL

## 2011-05-10 MED ORDER — HYDROCODONE-ACETAMINOPHEN 5-500 MG PO TABS: 1.0000 | ORAL_TABLET | Freq: Four times a day (QID) | ORAL | Status: AC | PRN

## 2011-05-10 MED ORDER — MORPHINE SULFATE 10 MG/ML IJ SOLN
8.0000 mg | Freq: Once | INTRAMUSCULAR | Status: AC
  Administered 2011-05-10: 8 mg via INTRAMUSCULAR
  Filled 2011-05-10: qty 1

## 2011-05-10 MED ORDER — DIPHENHYDRAMINE HCL 25 MG PO CAPS
ORAL_CAPSULE | ORAL | Status: AC
  Administered 2011-05-10: 25 mg via ORAL
  Filled 2011-05-10: qty 1

## 2011-05-10 NOTE — ED Provider Notes (Signed)
History     CSN: [id number] Arrival date & time: 05/10/2011  8:56 PM Scribed for Geoffery Lyons, MD, the patient was seen in room APA19/APA19. This chart was scribed by Katha Cabal. This patient's care was started at 10:24 PM.      Chief Complaint  Patient presents with  . Flank Pain      HPI Mary [name] is a 30 y.o. female who presents to the Emergency Department complaining of gradual worsening of intermittent stabbing sharp right flank and right groin pain that radiates up right back since partial hysterectomy in March 2010.  Patient states in last three weeks pain has been constant.  Denies dysuria, urinary and bowel incontinence.   Pain is aggravated by sitting upright and deep inspiration.  Patient has history of COPD, anxiety, and depression.    PCP- [facility]    PAST MEDICAL HISTORY:  Past Medical History  Diagnosis Date  . S/P partial hysterectomy   . Cancer of cervix     PAST SURGICAL HISTORY:  Past Surgical History  Procedure Date  . Abdominal hysterectomy     FAMILY HISTORY:  History reviewed. No pertinent family history.   SOCIAL HISTORY: History   Social History  . Marital Status: Divorced    Spouse Name: N/A    Number of Children: N/A  . Years of Education: N/A   Social History Main Topics  . Smoking status: Never Smoker   . Smokeless tobacco: None  . Alcohol Use: No  . Drug Use: No  . Sexually Active:    Other Topics Concern  . None   Social History Narrative  . None    Review of Systems 10 Systems reviewed and are negative for acute change except as noted in the HPI.  Allergies  Penicillins  Home Medications   Current Outpatient Rx  Name Route Sig Dispense Refill  . VITAMIN D 1000 UNITS PO TABS Oral Take 4,000 Units by mouth daily.      Marland Kitchen FLUTICASONE-SALMETEROL 250-50 MCG/DOSE IN AEPB Inhalation Inhale 1 puff into the lungs every 12 (twelve) hours.      . IBUPROFEN 200 MG PO TABS Oral Take 200 mg by mouth as  needed. For pain       Physical Exam    BP 124/70  Pulse 67  Temp(Src) 98.7 F (37.1 C) (Oral)  Resp 16  Ht 5\' 9"  (1.753 m)  Wt 256 lb 4.8 oz (116.257 kg)  BMI 37.85 kg/m2  SpO2 100%  Physical Exam  Nursing note and vitals reviewed. Constitutional: She is oriented to person, place, and time. She appears well-developed and well-nourished.  HENT:  Head: Normocephalic and atraumatic.  Eyes: Pupils are equal, round, and reactive to light.  Neck: Neck supple.  Cardiovascular: Normal rate, regular rhythm and normal heart sounds.   No murmur heard. Pulmonary/Chest: Effort normal and breath sounds normal. No respiratory distress. She has no wheezes. She has no rales.  Abdominal: Soft. Bowel sounds are normal. There is tenderness. There is no rebound and no guarding.       RUQ tenderness   Musculoskeletal: Normal range of motion.       Right flank tenderness with palpation, right proximal thigh tenderness with palpation   Neurological: She is alert and oriented to person, place, and time. No sensory deficit.  Skin: Skin is warm and dry. No rash noted.  Psychiatric: She has a normal mood and affect. Her behavior is normal.    ED Course  Procedures  OTHER DATA REVIEWED: Nursing notes, vital signs, and past medical records reviewed.   DIAGNOSTIC STUDIES: Oxygen Saturation is 100% on room air, normal by my interpretation.     LABS / RADIOLOGY:  Results for orders placed during the hospital encounter of 05/10/11  URINALYSIS, ROUTINE W REFLEX MICROSCOPIC      Component Value Range   Color, Urine YELLOW  YELLOW    Appearance CLEAR  CLEAR    Specific Gravity, Urine 1.020  1.005 - 1.030    pH 7.0  5.0 - 8.0    Glucose, UA NEGATIVE  NEGATIVE (mg/dL)   Hgb urine dipstick NEGATIVE  NEGATIVE    Bilirubin Urine NEGATIVE  NEGATIVE    Ketones, ur NEGATIVE  NEGATIVE (mg/dL)   Protein, ur NEGATIVE  NEGATIVE (mg/dL)   Urobilinogen, UA 0.2  0.0 - 1.0 (mg/dL)   Nitrite NEGATIVE  NEGATIVE     Leukocytes, UA NEGATIVE  NEGATIVE   CBC      Component Value Range   WBC 10.0  4.0 - 10.5 (K/uL)   RBC 4.29  3.87 - 5.11 (MIL/uL)   Hemoglobin 12.9  12.0 - 15.0 (g/dL)   HCT 16.1  09.6 - 04.5 (%)   MCV 88.1  78.0 - 100.0 (fL)   MCH 30.1  26.0 - 34.0 (pg)   MCHC 34.1  30.0 - 36.0 (g/dL)   RDW 40.9  81.1 - 91.4 (%)   Platelets 205  150 - 400 (K/uL)  DIFFERENTIAL      Component Value Range   Neutrophils Relative 70  43 - 77 (%)   Neutro Abs 7.0  1.7 - 7.7 (K/uL)   Lymphocytes Relative 23  12 - 46 (%)   Lymphs Abs 2.3  0.7 - 4.0 (K/uL)   Monocytes Relative 5  3 - 12 (%)   Monocytes Absolute 0.5  0.1 - 1.0 (K/uL)   Eosinophils Relative 1  0 - 5 (%)   Eosinophils Absolute 0.1  0.0 - 0.7 (K/uL)   Basophils Relative 0  0 - 1 (%)   Basophils Absolute 0.0  0.0 - 0.1 (K/uL)  BASIC METABOLIC PANEL      Component Value Range   Sodium 139  135 - 145 (mEq/L)   Potassium 3.9  3.5 - 5.1 (mEq/L)   Chloride 102  96 - 112 (mEq/L)   CO2 31  19 - 32 (mEq/L)   Glucose, Bld 88  70 - 99 (mg/dL)   BUN 8  6 - 23 (mg/dL)   Creatinine, Ser 7.82  0.50 - 1.10 (mg/dL)   Calcium 9.9  8.4 - 95.6 (mg/dL)   GFR calc non Af Amer >60  >60 (mL/min)   GFR calc Af Amer >60  >60 (mL/min)  HEPATIC FUNCTION PANEL      Component Value Range   Total Protein 7.5  6.0 - 8.3 (g/dL)   Albumin 4.2  3.5 - 5.2 (g/dL)   AST 15  0 - 37 (U/L)   ALT 16  0 - 35 (U/L)   Alkaline Phosphatase 99  39 - 117 (U/L)   Total Bilirubin 0.7  0.3 - 1.2 (mg/dL)   Bilirubin, Direct 0.2  0.0 - 0.3 (mg/dL)   Indirect Bilirubin 0.5  0.3 - 0.9 (mg/dL)  LIPASE, BLOOD      Component Value Range   Lipase 55  11 - 59 (U/L)     No results found.    ED COURSE / COORDINATION OF CARE: 10:30 PM  Physical Exam complete.  Will order CT Pelvis.   Discussed UA results with patient.  UA did not indicate infection.    Orders Placed This Encounter  Procedures  . CT Abdomen Pelvis Wo Contrast  . Urinalysis with microscopic  . CBC  .  Differential  . Basic metabolic panel  . Hepatic function panel  . Lipase, blood    MDM: CT, labs all normal.  I am unsure as to why patient has this pain.  It appears superficial as she is exquisitely ttp over the right flank with light palpation.  I will treat with pain medication and follow up with pcp.     IMPRESSION: Diagnoses that have been ruled out:  Diagnoses that are still under consideration:  Final diagnoses:     MEDICATIONS GIVEN IN THE E.D. Scheduled Meds:    . diphenhydrAMINE  25 mg Oral Once  . morphine  8 mg Intramuscular Once   Continuous Infusions:     DISCHARGE MEDICATIONS: New Prescriptions   No medications on file     I personally performed the services described in this documentation, which was scribed in my presence. The recorded information has been reviewed and considered. Geoffery Lyons, MD           Geoffery Lyons, MD 05/10/11 [phone]

## 2011-05-10 NOTE — ED Notes (Signed)
Pt reassessed.  Reports that itching and burning at morphine injection site has improved.  No respiratory distress noted.  Pt resting comfortably. Denies needs at present.

## 2011-05-10 NOTE — ED Notes (Signed)
Patient with right flank pain for 3 weeks, denies N/v, denies vaginal discharge, denies burning on urination

## 2011-05-10 NOTE — ED Notes (Signed)
2255  Called to room by family.  Pt reports that the injection site of the morphine is "burning and itching real bad".  Pt had previously denied allergy to morphine when drug was given.  However, per pt family, pt does have an allergy and pt wasn't aware of it. Per family, the reaction pt has to morphine is itching and burning.   No redness, rash or airway compromise noted.  Discussed with Dr Judd Lien and order received for p.o benadryl.  Allergies updated on chart.

## 2011-08-02 ENCOUNTER — Encounter: Payer: Self-pay | Admitting: Cardiovascular Disease

## 2013-09-15 ENCOUNTER — Emergency Department: Payer: Self-pay | Admitting: Emergency Medicine

## 2013-09-15 LAB — URINALYSIS, COMPLETE
Bilirubin,UR: NEGATIVE
Blood: NEGATIVE
Glucose,UR: NEGATIVE mg/dL (ref 0–75)
Leukocyte Esterase: NEGATIVE
Nitrite: NEGATIVE
Ph: 7 (ref 4.5–8.0)
Protein: NEGATIVE
RBC,UR: 3 /HPF (ref 0–5)
Specific Gravity: 1.005 (ref 1.003–1.030)
Squamous Epithelial: 5
WBC UR: 1 /HPF (ref 0–5)

## 2013-09-15 LAB — COMPREHENSIVE METABOLIC PANEL
Albumin: 4.4 g/dL (ref 3.4–5.0)
Alkaline Phosphatase: 97 U/L
Anion Gap: 3 — ABNORMAL LOW (ref 7–16)
BUN: 7 mg/dL (ref 7–18)
Bilirubin,Total: 0.7 mg/dL (ref 0.2–1.0)
Calcium, Total: 9.6 mg/dL (ref 8.5–10.1)
Chloride: 103 mmol/L (ref 98–107)
Co2: 29 mmol/L (ref 21–32)
Creatinine: 0.85 mg/dL (ref 0.60–1.30)
EGFR (African American): >60
EGFR (Non-African Amer.): >60
Glucose: 80 mg/dL (ref 65–99)
Osmolality: 267 (ref 275–301)
Potassium: 4.1 mmol/L (ref 3.5–5.1)
SGOT(AST): 28 U/L (ref 15–37)
SGPT (ALT): 26 U/L (ref 12–78)
Sodium: 135 mmol/L — ABNORMAL LOW (ref 136–145)
Total Protein: 8.2 g/dL (ref 6.4–8.2)

## 2013-09-15 LAB — CBC WITH DIFFERENTIAL/PLATELET
Basophil #: 0 10*3/uL (ref 0.0–0.1)
Basophil %: 0.5 %
Eosinophil #: 0.1 10*3/uL (ref 0.0–0.7)
Eosinophil %: 0.8 %
HCT: 38.1 % (ref 35.0–47.0)
HGB: 13.3 g/dL (ref 12.0–16.0)
Lymphocyte #: 1.3 10*3/uL (ref 1.0–3.6)
Lymphocyte %: 14.1 %
MCH: 30.7 pg (ref 26.0–34.0)
MCHC: 34.8 g/dL (ref 32.0–36.0)
MCV: 88 fL (ref 80–100)
Monocyte #: 0.4 x10 3/mm (ref 0.2–0.9)
Monocyte %: 4.1 %
Neutrophil #: 7.2 10*3/uL — ABNORMAL HIGH (ref 1.4–6.5)
Neutrophil %: 80.5 %
Platelet: 211 10*3/uL (ref 150–440)
RBC: 4.32 10*6/uL (ref 3.80–5.20)
RDW: 13.4 % (ref 11.5–14.5)
WBC: 8.9 10*3/uL (ref 3.6–11.0)

## 2013-09-15 LAB — LIPASE, BLOOD: Lipase: 192 U/L (ref 73–393)

## 2014-01-02 ENCOUNTER — Emergency Department (HOSPITAL_COMMUNITY)
Admission: EM | Admit: 2014-01-02 | Discharge: 2014-01-02 | Disposition: A | Discharge status: Home / Self Care | Payer: Medicaid Other | Attending: Emergency Medicine | Admitting: Emergency Medicine

## 2014-01-02 ENCOUNTER — Encounter (HOSPITAL_COMMUNITY): Payer: Self-pay | Admitting: Emergency Medicine

## 2014-01-02 DIAGNOSIS — M79609 Pain in unspecified limb: Secondary | ICD-10-CM | POA: Insufficient documentation

## 2014-01-02 DIAGNOSIS — M549 Dorsalgia, unspecified: Secondary | ICD-10-CM

## 2014-01-02 DIAGNOSIS — M545 Low back pain, unspecified: Secondary | ICD-10-CM | POA: Insufficient documentation

## 2014-01-02 DIAGNOSIS — R5383 Other fatigue: Secondary | ICD-10-CM

## 2014-01-02 DIAGNOSIS — R5381 Other malaise: Secondary | ICD-10-CM | POA: Insufficient documentation

## 2014-01-02 DIAGNOSIS — E669 Obesity, unspecified: Secondary | ICD-10-CM | POA: Insufficient documentation

## 2014-01-02 DIAGNOSIS — Z90711 Acquired absence of uterus with remaining cervical stump: Secondary | ICD-10-CM | POA: Insufficient documentation

## 2014-01-02 DIAGNOSIS — Z79899 Other long term (current) drug therapy: Secondary | ICD-10-CM | POA: Insufficient documentation

## 2014-01-02 DIAGNOSIS — Z880 Allergy status to penicillin: Secondary | ICD-10-CM | POA: Insufficient documentation

## 2014-01-02 DIAGNOSIS — Z8541 Personal history of malignant neoplasm of cervix uteri: Secondary | ICD-10-CM | POA: Insufficient documentation

## 2014-01-02 DIAGNOSIS — J45909 Unspecified asthma, uncomplicated: Secondary | ICD-10-CM | POA: Insufficient documentation

## 2014-01-02 MED ORDER — KETOROLAC TROMETHAMINE 30 MG/ML IJ SOLN: 30.0000 mg | Freq: Once | INTRAMUSCULAR | Status: DC

## 2014-01-02 MED ORDER — IBUPROFEN 800 MG PO TABS
800.0000 mg | ORAL_TABLET | Freq: Three times a day (TID) | ORAL | Status: DC
Start: 2014-01-02 — End: 2014-12-06

## 2014-01-02 MED ORDER — DIAZEPAM 5 MG PO TABS: 5.0000 mg | ORAL_TABLET | Freq: Two times a day (BID) | ORAL | Status: DC

## 2014-01-02 MED ORDER — PREDNISONE 20 MG PO TABS
60.0000 mg | ORAL_TABLET | ORAL | Status: AC
  Administered 2014-01-02: 60 mg via ORAL
  Filled 2014-01-02: qty 3

## 2014-01-02 MED ORDER — DIAZEPAM 5 MG PO TABS
5.0000 mg | ORAL_TABLET | Freq: Once | ORAL | Status: AC
  Administered 2014-01-02: 5 mg via ORAL
  Filled 2014-01-02: qty 1

## 2014-01-02 MED ORDER — PREDNISONE 20 MG PO TABS: 60.0000 mg | ORAL_TABLET | ORAL | Status: DC

## 2014-01-02 MED ORDER — OXYCODONE-ACETAMINOPHEN 5-325 MG PO TABS
2.0000 | ORAL_TABLET | Freq: Once | ORAL | Status: AC
  Administered 2014-01-02: 2 via ORAL
  Filled 2014-01-02: qty 2

## 2014-01-02 MED ORDER — KETOROLAC TROMETHAMINE 60 MG/2ML IM SOLN
60.0000 mg | Freq: Once | INTRAMUSCULAR | Status: AC
  Administered 2014-01-02: 60 mg via INTRAMUSCULAR
  Filled 2014-01-02: qty 2

## 2014-01-02 MED ORDER — OXYCODONE-ACETAMINOPHEN 10-325 MG PO TABS: 1.0000 | ORAL_TABLET | Freq: Three times a day (TID) | ORAL | Status: DC | PRN

## 2014-01-02 MED ORDER — PREDNISONE 20 MG PO TABS: 60.0000 mg | ORAL_TABLET | Freq: Every day | ORAL | Status: DC

## 2014-01-02 NOTE — Discharge Instructions (Signed)
As discussed, your evaluation here is largely reassuring, but with your low back pain, and concern for sciatica it is important to follow up with primary care physician and an orthopedic specialist to consider physical therapy and additional therapeutic options.   Return here for concerning changes in your condition.

## 2014-01-02 NOTE — ED Notes (Signed)
Dr. Vanita Panda at [facility], family at [facility]. Pt alert, NAD, calm, interactive, skin W&D, resps e/u, speaking in clear complete sentences.  C/o back pain, BLE numbness and weakness (legs gave out), pt participatory and cooperative.

## 2014-01-02 NOTE — ED Provider Notes (Signed)
CSN: [id number]     Arrival date & time 01/02/14  1651 History   First MD Initiated Contact with Patient 01/02/14 1711     Chief Complaint  Patient presents with  . Numbness     (Consider location/radiation/quality/duration/timing/severity/associated sxs/prior Treatment) HPI Patient presents with back pain and weakness. Patient has long history of low back pain with radiation down both legs. She has intermittent weakness, severe pain.  Today the pain became worse, and she felt weak.  She fell.  Pain is worse with walking.  The pain is minimally improved with Advil.  There was no incontinence either of bowel or bladder continence. Patient denies head trauma, loss of consciousness, fever, chills, vomiting, diarrhea.   Patient has not seen ortho or PT throughout her illness. Past Medical History  Diagnosis Date  . S/P partial hysterectomy   . Cancer of cervix   . Asthma    Past Surgical History  Procedure Laterality Date  . Abdominal hysterectomy    . Eye surgery      Artificial right eye   History reviewed. No pertinent family history. History  Substance Use Topics  . Smoking status: Never Smoker   . Smokeless tobacco: Not on file  . Alcohol Use: No   OB History   Grav Para Term Preterm Abortions TAB SAB Ect Mult Living                 Review of Systems  Constitutional:       Per HPI, otherwise negative  HENT:       Per HPI, otherwise negative  Respiratory:       Per HPI, otherwise negative  Cardiovascular:       Per HPI, otherwise negative  Gastrointestinal: Negative for vomiting.  Endocrine:       Negative aside from HPI  Genitourinary:       Neg aside from HPI   Musculoskeletal:       Per HPI, otherwise negative  Skin: Negative for wound.  Neurological: Positive for weakness. Negative for dizziness and syncope.      Allergies  Morphine and related; Penicillins; and Vicodin  Home Medications   Prior to Admission medications   Medication Sig Start  Date End Date Taking? Authorizing Provider  albuterol (PROVENTIL HFA;VENTOLIN HFA) 108 (90 BASE) MCG/ACT inhaler Inhale 2 puffs into the lungs as needed.     Yes Historical Provider, MD  ibuprofen (ADVIL,MOTRIN) 200 MG tablet Take 800 mg by mouth every 8 (eight) hours as needed for moderate pain. For pain   Yes Historical Provider, MD   BP 138/78  Pulse 55  Temp(Src) 98.2 F (36.8 C) (Oral)  Resp 22  SpO2 99% Physical Exam  Nursing note and vitals reviewed. Constitutional: She is oriented to person, place, and time. She appears well-developed and well-nourished. No distress.  Obese young F, resting in bed, moving both legs spontaneously.   HENT:  Head: Normocephalic and atraumatic.  Eyes: Conjunctivae and EOM are normal.  Cardiovascular: Normal rate and regular rhythm.   Pulmonary/Chest: Effort normal and breath sounds normal. No stridor. No respiratory distress.  Abdominal: She exhibits no distension.  Musculoskeletal: She exhibits no edema.  Though the patient describes numbness in both legs, she moves both legs independently, sensation throughout both legs  Neurological: She is alert and oriented to person, place, and time. No cranial nerve deficit.  Skin: Skin is warm and dry. She is not diaphoretic.  Psychiatric: She has a normal mood and affect.  ED Course  Procedures (including critical care time)   9:48 PM Patient states that she feels substantially better, has no ongoing complaints.  She'll follow up with orthopedics and physical therapy.   MDM  Patient presents with progressive lower back pain, numbness and pain extending both lower extremities.  Patient is awake, alert, neurologically intact and hemodynamically stable, and has diminished pain while here.  With no evidence of neurologic compromise she was discharged to follow up with orthopedics, physical therapy.   [name], MD 01/02/14 2149

## 2014-01-02 NOTE — ED Notes (Signed)
She states her sciatic pain is worse today. She states it hurts to walk. She denies bowel/bladder changes

## 2014-01-08 ENCOUNTER — Other Ambulatory Visit: Payer: Self-pay | Admitting: Orthopaedic Surgery

## 2014-01-08 DIAGNOSIS — M545 Low back pain, unspecified: Secondary | ICD-10-CM

## 2014-01-09 ENCOUNTER — Ambulatory Visit
Admission: RE | Admit: 2014-01-09 | Discharge: 2014-01-09 | Disposition: A | Discharge status: Home / Self Care | Payer: Medicaid Other | Source: Ambulatory Visit | Attending: Orthopaedic Surgery | Admitting: Orthopaedic Surgery

## 2014-01-09 DIAGNOSIS — M545 Low back pain, unspecified: Secondary | ICD-10-CM

## 2014-01-17 ENCOUNTER — Other Ambulatory Visit: Payer: Medicaid Other

## 2014-02-03 DIAGNOSIS — M545 Low back pain, unspecified: Secondary | ICD-10-CM | POA: Insufficient documentation

## 2014-02-03 DIAGNOSIS — G8929 Other chronic pain: Secondary | ICD-10-CM | POA: Insufficient documentation

## 2014-02-03 DIAGNOSIS — H5440 Blindness, one eye, unspecified eye: Secondary | ICD-10-CM | POA: Insufficient documentation

## 2014-02-03 DIAGNOSIS — J449 Chronic obstructive pulmonary disease, unspecified: Secondary | ICD-10-CM | POA: Insufficient documentation

## 2014-02-03 DIAGNOSIS — F4310 Post-traumatic stress disorder, unspecified: Secondary | ICD-10-CM | POA: Insufficient documentation

## 2014-02-03 DIAGNOSIS — F411 Generalized anxiety disorder: Secondary | ICD-10-CM | POA: Insufficient documentation

## 2014-03-15 ENCOUNTER — Emergency Department (HOSPITAL_COMMUNITY)
Admission: EM | Admit: 2014-03-15 | Discharge: 2014-03-15 | Discharge status: Against Medical Advice | Payer: Medicaid Other | Attending: Emergency Medicine | Admitting: Emergency Medicine

## 2014-03-15 ENCOUNTER — Encounter (HOSPITAL_COMMUNITY): Payer: Self-pay | Admitting: Emergency Medicine

## 2014-03-15 DIAGNOSIS — R1031 Right lower quadrant pain: Secondary | ICD-10-CM | POA: Insufficient documentation

## 2014-03-15 DIAGNOSIS — J449 Chronic obstructive pulmonary disease, unspecified: Secondary | ICD-10-CM | POA: Diagnosis not present

## 2014-03-15 DIAGNOSIS — R112 Nausea with vomiting, unspecified: Secondary | ICD-10-CM | POA: Diagnosis not present

## 2014-03-15 DIAGNOSIS — J4489 Other specified chronic obstructive pulmonary disease: Secondary | ICD-10-CM | POA: Insufficient documentation

## 2014-03-15 HISTORY — DX: Chronic obstructive pulmonary disease, unspecified: J44.9

## 2014-03-15 HISTORY — DX: Other ovarian cyst, unspecified side: N83.299

## 2014-03-15 NOTE — ED Notes (Addendum)
Pain in R lower quadrant abdomen began end of last week.  Seen at [facility] and dx w/UTI and R ovarian cyst via CT scan.  Was given antibx and Percocet 5/325 which has not been helping today.  States she began having more severe pain with burning sensation in RLQ.  Hx of cervical CA and L ovarian cyst for which had partial hysterctomy and L oophrectomy.  Nausea and vomiting today.  Has appointment w/pain management this coming week for spinal issues.

## 2014-03-15 NOTE — ED Notes (Signed)
Unable to locate pt in all waiting areas x 3 

## 2014-03-17 DIAGNOSIS — M5432 Sciatica, left side: Secondary | ICD-10-CM | POA: Insufficient documentation

## 2014-03-17 DIAGNOSIS — M5431 Sciatica, right side: Secondary | ICD-10-CM | POA: Insufficient documentation

## 2014-03-17 DIAGNOSIS — R29898 Other symptoms and signs involving the musculoskeletal system: Secondary | ICD-10-CM | POA: Insufficient documentation

## 2014-03-18 ENCOUNTER — Emergency Department (HOSPITAL_COMMUNITY): Payer: Medicaid Other

## 2014-03-18 ENCOUNTER — Encounter (HOSPITAL_COMMUNITY): Payer: Self-pay | Admitting: Emergency Medicine

## 2014-03-18 ENCOUNTER — Emergency Department (HOSPITAL_COMMUNITY)
Admission: EM | Admit: 2014-03-18 | Discharge: 2014-03-18 | Disposition: A | Discharge status: Home / Self Care | Payer: Medicaid Other | Attending: Emergency Medicine | Admitting: Emergency Medicine

## 2014-03-18 DIAGNOSIS — R4701 Aphasia: Secondary | ICD-10-CM | POA: Insufficient documentation

## 2014-03-18 DIAGNOSIS — R5381 Other malaise: Secondary | ICD-10-CM | POA: Insufficient documentation

## 2014-03-18 DIAGNOSIS — Z8742 Personal history of other diseases of the female genital tract: Secondary | ICD-10-CM | POA: Insufficient documentation

## 2014-03-18 DIAGNOSIS — R42 Dizziness and giddiness: Secondary | ICD-10-CM | POA: Diagnosis not present

## 2014-03-18 DIAGNOSIS — Z880 Allergy status to penicillin: Secondary | ICD-10-CM | POA: Insufficient documentation

## 2014-03-18 DIAGNOSIS — J4489 Other specified chronic obstructive pulmonary disease: Secondary | ICD-10-CM | POA: Insufficient documentation

## 2014-03-18 DIAGNOSIS — R5383 Other fatigue: Principal | ICD-10-CM

## 2014-03-18 DIAGNOSIS — J449 Chronic obstructive pulmonary disease, unspecified: Secondary | ICD-10-CM | POA: Diagnosis not present

## 2014-03-18 DIAGNOSIS — E663 Overweight: Secondary | ICD-10-CM | POA: Insufficient documentation

## 2014-03-18 DIAGNOSIS — Z8541 Personal history of malignant neoplasm of cervix uteri: Secondary | ICD-10-CM | POA: Insufficient documentation

## 2014-03-18 DIAGNOSIS — R531 Weakness: Secondary | ICD-10-CM

## 2014-03-18 DIAGNOSIS — Z90711 Acquired absence of uterus with remaining cervical stump: Secondary | ICD-10-CM | POA: Diagnosis not present

## 2014-03-18 DIAGNOSIS — Z79899 Other long term (current) drug therapy: Secondary | ICD-10-CM | POA: Insufficient documentation

## 2014-03-18 LAB — CBC WITH DIFFERENTIAL/PLATELET
Basophils Absolute: 0 10*3/uL (ref 0.0–0.1)
Basophils Relative: 0 % (ref 0–1)
Eosinophils Absolute: 0.1 10*3/uL (ref 0.0–0.7)
Eosinophils Relative: 1 % (ref 0–5)
HCT: 35.4 % — ABNORMAL LOW (ref 36.0–46.0)
Hemoglobin: 12 g/dL (ref 12.0–15.0)
Lymphocytes Relative: 21 % (ref 12–46)
Lymphs Abs: 1.2 10*3/uL (ref 0.7–4.0)
MCH: 29.4 pg (ref 26.0–34.0)
MCHC: 33.9 g/dL (ref 30.0–36.0)
MCV: 86.8 fL (ref 78.0–100.0)
Monocytes Absolute: 0.3 10*3/uL (ref 0.1–1.0)
Monocytes Relative: 5 % (ref 3–12)
Neutro Abs: 4.5 10*3/uL (ref 1.7–7.7)
Neutrophils Relative %: 73 % (ref 43–77)
Platelets: 175 10*3/uL (ref 150–400)
RBC: 4.08 MIL/uL (ref 3.87–5.11)
RDW: 13.2 % (ref 11.5–15.5)
WBC: 6.1 10*3/uL (ref 4.0–10.5)

## 2014-03-18 LAB — URINALYSIS, ROUTINE W REFLEX MICROSCOPIC
Bilirubin Urine: NEGATIVE
Glucose, UA: NEGATIVE mg/dL
Hgb urine dipstick: NEGATIVE
Ketones, ur: NEGATIVE mg/dL
Nitrite: NEGATIVE
Protein, ur: NEGATIVE mg/dL
Specific Gravity, Urine: 1.005 (ref 1.005–1.030)
Urobilinogen, UA: 0.2 mg/dL (ref 0.0–1.0)
pH: 7.5 (ref 5.0–8.0)

## 2014-03-18 LAB — BASIC METABOLIC PANEL
Anion gap: 12 (ref 5–15)
BUN: 10 mg/dL (ref 6–23)
CO2: 26 mEq/L (ref 19–32)
Calcium: 9.2 mg/dL (ref 8.4–10.5)
Chloride: 103 mEq/L (ref 96–112)
Creatinine, Ser: 0.88 mg/dL (ref 0.50–1.10)
GFR calc Af Amer: >90 mL/min (ref >90)
GFR calc non Af Amer: 85 mL/min — ABNORMAL LOW (ref >90)
Glucose, Bld: 92 mg/dL (ref 70–99)
Potassium: 3.9 mEq/L (ref 3.7–5.3)
Sodium: 141 mEq/L (ref 137–147)

## 2014-03-18 LAB — URINE MICROSCOPIC-ADD ON

## 2014-03-18 LAB — TROPONIN I: Troponin I: <0.3 ng/mL (ref <0.30)

## 2014-03-18 NOTE — ED Notes (Signed)
MD at bedside. 

## 2014-03-18 NOTE — ED Notes (Signed)
33 yo female via [place] c/o aphasia around 0730 this morning and Right Arm weakness yesterday around 0800. Pt reports weakness beginning post pain mg clinic appt. No hx of TIA but states "my mom had them so I know what they are." Pt is able to move all extremities and ambulates w/o difficulty. Speech is clear. Vitals stable A/O X3.

## 2014-03-18 NOTE — ED Notes (Signed)
Pt has returned from being out of the department; pt placed back on monitor, continuous pulse oximetry and blood pressure cuff; husband at bedside

## 2014-03-18 NOTE — ED Notes (Signed)
Reports dx of bladder infection days ago and rx antibiotics.

## 2014-03-18 NOTE — ED Notes (Signed)
Brandi, RN undressed pt, placed on monitor, continuous pulse oximetry and blood pressure cuff

## 2014-03-18 NOTE — ED Notes (Signed)
Horton, MD at bedside.  

## 2014-03-18 NOTE — ED Provider Notes (Signed)
CSN: [id number]     Arrival date & time 03/18/14  1022 History   First MD Initiated Contact with Patient 03/18/14 1024     Chief Complaint  Patient presents with  . Aphasia  . Extremity Weakness    right sided     (Consider location/radiation/quality/duration/timing/severity/associated sxs/prior Treatment) HPI  This is a 33 year old female with history of partial hysterectomy, asthma, COPD who presents with reported speech difficulties and weakness. Patient states that she was driving this morning and did not feel well. She states that she felt dizzy. She states she felt off balance but there was no room spinning dizziness. She went home and laid down thinking that it was likely related to her blood pressure. When she woke up she felt that she had difficulty speaking.  She also reports that she felt like her right hand was weak. Symptoms lasted <10 min.  There were no family members or friends to observe these. Patient currently states that she is back to her baseline. She denies any headache, chest pain, shortness of breath, syncope. She does report recent diagnosis of UTI and is currently on antibiotics. No history of stroke, hypertension, hyperlipidemia. She's not a smoker. No history of atrial fibrillation.  Past Medical History  Diagnosis Date  . S/P partial hysterectomy   . Cancer of cervix   . Asthma   . COPD (chronic obstructive pulmonary disease)   . Physiological ovarian cysts    Past Surgical History  Procedure Laterality Date  . Abdominal hysterectomy    . Eye surgery      Artificial right eye  . L ovarian removal Left    No family history on file. History  Substance Use Topics  . Smoking status: Never Smoker   . Smokeless tobacco: Not on file  . Alcohol Use: No   OB History   Grav Para Term Preterm Abortions TAB SAB Ect Mult Living                 Review of Systems  Constitutional: Negative for fever.  Respiratory: Negative for chest tightness and shortness  of breath.   Cardiovascular: Negative for chest pain.  Gastrointestinal: Negative for nausea, vomiting and abdominal pain.  Genitourinary: Negative for dysuria.  Neurological: Positive for dizziness, speech difficulty, weakness and light-headedness. Negative for syncope, numbness and headaches.  Psychiatric/Behavioral: Negative for confusion.  All other systems reviewed and are negative.     Allergies  Morphine and related; Penicillins; and Vicodin  Home Medications   Prior to Admission medications   Medication Sig Start Date End Date Taking? Authorizing Provider  albuterol (PROVENTIL HFA;VENTOLIN HFA) 108 (90 BASE) MCG/ACT inhaler Inhale 2 puffs into the lungs as needed for wheezing or shortness of breath.    Yes Historical Provider, MD  gabapentin (NEURONTIN) 300 MG capsule Take 300 mg by mouth 3 (three) times daily.   Yes Historical Provider, MD  ibuprofen (ADVIL,MOTRIN) 800 MG tablet Take 1 tablet (800 mg total) by mouth 3 (three) times daily. For pain 01/02/14  Yes [name], MD  oxyCODONE-acetaminophen (PERCOCET/ROXICET) 5-325 MG per tablet Take 1-2 tablets by mouth every 4 (four) hours as needed for moderate pain or severe pain.   Yes Historical Provider, MD   BP 139/86  Pulse 112  Temp(Src) 98.6 F (37 C) (Oral)  Resp 17  SpO2 100% Physical Exam  Nursing note and vitals reviewed. Constitutional: She is oriented to person, place, and time. No distress.  Overweight, no acute distress  HENT:  Head: Normocephalic and atraumatic.  Eyes:  Left pupil 5 mm and reactive, glass eye on the right  Neck: Neck supple.  Cardiovascular: Normal rate, regular rhythm and normal heart sounds.   No murmur heard. Pulmonary/Chest: Effort normal and breath sounds normal. No respiratory distress. She has no wheezes.  Abdominal: Soft. Bowel sounds are normal. There is no tenderness. There is no rebound and no guarding.  Musculoskeletal: She exhibits no edema.  Neurological: She is alert  and oriented to person, place, and time.  Cranial nerves II through XII intact, no dysmetria to finger-nose-finger, 5 out of 5 strength in all 4 extremities, normal gait  Skin: Skin is warm and dry.  Psychiatric: She has a normal mood and affect.    ED Course  Procedures (including critical care time) Labs Review Labs Reviewed  CBC WITH DIFFERENTIAL - Abnormal; Notable for the following:    HCT 35.4 (*)    All other components within normal limits  BASIC METABOLIC PANEL - Abnormal; Notable for the following:    GFR calc non Af Amer 85 (*)    All other components within normal limits  URINALYSIS, ROUTINE W REFLEX MICROSCOPIC - Abnormal; Notable for the following:    Leukocytes, UA TRACE (*)    All other components within normal limits  URINE MICROSCOPIC-ADD ON - Abnormal; Notable for the following:    Squamous Epithelial / LPF FEW (*)    All other components within normal limits  TROPONIN I    Imaging Review Mr Brain Wo Contrast  03/18/2014   CLINICAL DATA:  Aphasia.  Right arm weakness  EXAM: MRI HEAD WITHOUT CONTRAST  TECHNIQUE: Multiplanar, multiecho pulse sequences of the brain and surrounding structures were obtained without intravenous contrast.  COMPARISON:  CT head 02/20/2011  FINDINGS: Ventricle size is normal. Negative for Chiari malformation. Pituitary normal in size.  Negative for acute infarct.  No significant chronic ischemia.  Negative for demyelinating disease. Cerebral white matter is normal. Brainstem and basal ganglia normal.  Negative for hemorrhage or mass.  Right eye prosthesis.  Mild mucosal edema in the paranasal sinuses.  IMPRESSION: Normal MRI of the brain without contrast.   Electronically Signed   By: Franchot Gallo M.D.   On: 03/18/2014 13:23     EKG Interpretation   Date/Time:  Wednesday March 18 2014 10:47:27 EDT Ventricular Rate:  61 PR Interval:  155 QRS Duration: 95 QT Interval:  442 QTC Calculation: 445 R Axis:   26 Text Interpretation:  Sinus  rhythm Low voltage, precordial leads Artifact  Confirmed by Stacey Maura  MD, Mattalyn Anderegg ([id number]) on 03/18/2014 11:18:03 AM      MDM   Final diagnoses:  Weakness    Patient reports weakness and difficulty speaking earlier today associated with dizziness.  She is currently at her baseline. Exam is reassuring and she is nonfocal. Basic labwork shows no evidence of electrolyte abnormality. Regarding patient's dizziness she is not orthostatic and EKG is without arrhythmia. Troponin is negative. Patient is concerned that she may be having "a TIA because my mother had them."  Patient is low risk for stroke given her medical history; however, given reported symptoms earlier today we'll obtain MRI. MRI is negative.  ABCD2 score is 2, low risk.  Patient to followup with PCP.  7:35 AM Patient noted to have Trichomonas in urine after discharge. Will call patient in [place].    Merryl Hacker, MD 03/19/14 [phone]

## 2014-03-18 NOTE — ED Notes (Signed)
Pt up ambulatory to the bathroom at this time with no assistance, distress or difficulty; husband at bedside

## 2014-03-18 NOTE — ED Notes (Signed)
Cup of coffee given to pt's husband with another cup of creamer, sugars, sweetners and a spoon

## 2014-03-18 NOTE — Discharge Instructions (Signed)

## 2014-03-19 ENCOUNTER — Ambulatory Visit (INDEPENDENT_AMBULATORY_CARE_PROVIDER_SITE_OTHER): Payer: Medicaid Other | Admitting: Advanced Practice Midwife

## 2014-03-19 ENCOUNTER — Encounter: Payer: Self-pay | Admitting: Advanced Practice Midwife

## 2014-03-19 DIAGNOSIS — N83209 Unspecified ovarian cyst, unspecified side: Secondary | ICD-10-CM

## 2014-03-19 DIAGNOSIS — N83201 Unspecified ovarian cyst, right side: Secondary | ICD-10-CM | POA: Insufficient documentation

## 2014-03-19 DIAGNOSIS — A599 Trichomoniasis, unspecified: Secondary | ICD-10-CM

## 2014-03-19 MED ORDER — METRONIDAZOLE 500 MG PO TABS: 2000.0000 mg | ORAL_TABLET | Freq: Once | ORAL | Status: DC

## 2014-03-19 NOTE — Progress Notes (Signed)
[facility] Visit  Patient name: Mary [name] MRN [id number]  Date of birth: 1981/06/14  CC & HPI:  Mary [name] is a 33 y.o. Caucasian female presenting today for Right sided pain.  She was seen at [facility] yesterday for right sided weakness/dizziness/aphasia.  Her MRI was normal, sx resolved but still has slurred speech. She was noted to have trich in her urine and Flagyl was called in.  Seen at [facility] 4 days ago and dx with right simple ovarian cyst.  She has had a hysterectomy and L ooperectomy. Here today for f/u for ovarian cyst   Pertinent History Reviewed:  Medical & Surgical Hx:   Past Medical History  Diagnosis Date  . S/P partial hysterectomy   . Cancer of cervix   . Asthma   . COPD (chronic obstructive pulmonary disease)   . Physiological ovarian cysts    Past Surgical History  Procedure Laterality Date  . Abdominal hysterectomy    . Eye surgery      Artificial right eye  . L ovarian removal Left    Medications: Reviewed & Updated - see associated section Social History: Reviewed -  reports that she has never smoked. She has never used smokeless tobacco.  Objective Findings:  Vitals: There were no vitals taken for this visit.  Physical Examination: General appearance - alert, well appearing, and in no distress Mental status - alert, oriented to person, place, and time, speech slightly slurred Abdomen:  Tender in RLQ Pelvic - examination not indicated;  US shows simple cyst. Also says "L ovary normal" but pt states her L ovary has been removed. Urine + trich.  Not treated. Extremities - no weakness  Results for orders placed during the hospital encounter of 03/18/14 (from the past 24 hour(s))  URINALYSIS, ROUTINE W REFLEX MICROSCOPIC   Collection Time    03/18/14 10:58 AM      Result Value Ref Range   Color, Urine YELLOW  YELLOW   APPearance CLEAR  CLEAR   Specific Gravity, Urine 1.005  1.005 - 1.030   pH 7.5  5.0 - 8.0   Glucose, UA NEGATIVE   NEGATIVE mg/dL   Hgb urine dipstick NEGATIVE  NEGATIVE   Bilirubin Urine NEGATIVE  NEGATIVE   Ketones, ur NEGATIVE  NEGATIVE mg/dL   Protein, ur NEGATIVE  NEGATIVE mg/dL   Urobilinogen, UA 0.2  0.0 - 1.0 mg/dL   Nitrite NEGATIVE  NEGATIVE   Leukocytes, UA TRACE (*) NEGATIVE  URINE MICROSCOPIC-ADD ON   Collection Time    03/18/14 10:58 AM      Result Value Ref Range   Squamous Epithelial / LPF FEW (*) RARE   WBC, UA 3-6  <3 WBC/hpf   RBC / HPF 0-2  <3 RBC/hpf   Urine-Other TRICHOMONAS PRESENT    CBC WITH DIFFERENTIAL   Collection Time    03/18/14 11:30 AM      Result Value Ref Range   WBC 6.1  4.0 - 10.5 K/uL   RBC 4.08  3.87 - 5.11 MIL/uL   Hemoglobin 12.0  12.0 - 15.0 g/dL   HCT 35.4 (*) 36.0 - 46.0 %   MCV 86.8  78.0 - 100.0 fL   MCH 29.4  26.0 - 34.0 pg   MCHC 33.9  30.0 - 36.0 g/dL   RDW 13.2  11.5 - 15.5 %   Platelets 175  150 - 400 K/uL   Neutrophils Relative % 73  43 - 77 %  Neutro Abs 4.5  1.7 - 7.7 K/uL   Lymphocytes Relative 21  12 - 46 %   Lymphs Abs 1.2  0.7 - 4.0 K/uL   Monocytes Relative 5  3 - 12 %   Monocytes Absolute 0.3  0.1 - 1.0 K/uL   Eosinophils Relative 1  0 - 5 %   Eosinophils Absolute 0.1  0.0 - 0.7 K/uL   Basophils Relative 0  0 - 1 %   Basophils Absolute 0.0  0.0 - 0.1 K/uL  BASIC METABOLIC PANEL   Collection Time    03/18/14 11:30 AM      Result Value Ref Range   Sodium 141  137 - 147 mEq/L   Potassium 3.9  3.7 - 5.3 mEq/L   Chloride 103  96 - 112 mEq/L   CO2 26  19 - 32 mEq/L   Glucose, Bld 92  70 - 99 mg/dL   BUN 10  6 - 23 mg/dL   Creatinine, Ser 0.88  0.50 - 1.10 mg/dL   Calcium 9.2  8.4 - 10.5 mg/dL   GFR calc non Af Amer 85 (*) >90 mL/min   GFR calc Af Amer >90  >90 mL/min   Anion gap 12  5 - 15  TROPONIN I   Collection Time    03/18/14 11:30 AM      Result Value Ref Range   Troponin I <0.30  <0.30 ng/mL     Assessment & Plan:  A:   Right simple ovarian cyst  Trichimoniasis P:  Treat pt and partner   F/U 4 weeks for  POC trich and 8 weeks recheck cyst/look for left ovary.  Pt seeing her PCP in [place], [place] today for continued slurred speech.  Copy of MD report and MRI given to her   CRESENZO-DISHMAN,Mary [name] CNM 03/19/2014 9:41 AM

## 2014-05-18 ENCOUNTER — Other Ambulatory Visit: Payer: Self-pay | Admitting: Advanced Practice Midwife

## 2014-05-18 DIAGNOSIS — N83209 Unspecified ovarian cyst, unspecified side: Secondary | ICD-10-CM

## 2014-05-20 ENCOUNTER — Encounter: Payer: Self-pay | Admitting: *Deleted

## 2014-05-20 ENCOUNTER — Ambulatory Visit: Payer: Medicaid Other | Admitting: Obstetrics and Gynecology

## 2014-05-20 ENCOUNTER — Other Ambulatory Visit: Payer: Medicaid Other

## 2014-09-03 ENCOUNTER — Emergency Department (HOSPITAL_COMMUNITY)
Admission: EM | Admit: 2014-09-03 | Discharge: 2014-09-03 | Disposition: A | Discharge status: Home / Self Care | Payer: Medicaid Other | Attending: Emergency Medicine | Admitting: Emergency Medicine

## 2014-09-03 ENCOUNTER — Emergency Department (HOSPITAL_COMMUNITY): Payer: Medicaid Other

## 2014-09-03 ENCOUNTER — Encounter (HOSPITAL_COMMUNITY): Payer: Self-pay | Admitting: Emergency Medicine

## 2014-09-03 DIAGNOSIS — Z90710 Acquired absence of both cervix and uterus: Secondary | ICD-10-CM | POA: Diagnosis not present

## 2014-09-03 DIAGNOSIS — J449 Chronic obstructive pulmonary disease, unspecified: Secondary | ICD-10-CM | POA: Diagnosis not present

## 2014-09-03 DIAGNOSIS — Z79899 Other long term (current) drug therapy: Secondary | ICD-10-CM | POA: Insufficient documentation

## 2014-09-03 DIAGNOSIS — R1031 Right lower quadrant pain: Secondary | ICD-10-CM | POA: Diagnosis present

## 2014-09-03 DIAGNOSIS — N83201 Unspecified ovarian cyst, right side: Secondary | ICD-10-CM

## 2014-09-03 DIAGNOSIS — Z7951 Long term (current) use of inhaled steroids: Secondary | ICD-10-CM | POA: Diagnosis not present

## 2014-09-03 DIAGNOSIS — Z8541 Personal history of malignant neoplasm of cervix uteri: Secondary | ICD-10-CM | POA: Insufficient documentation

## 2014-09-03 DIAGNOSIS — R109 Unspecified abdominal pain: Secondary | ICD-10-CM

## 2014-09-03 DIAGNOSIS — N830 Follicular cyst of ovary: Secondary | ICD-10-CM | POA: Diagnosis not present

## 2014-09-03 DIAGNOSIS — R1084 Generalized abdominal pain: Secondary | ICD-10-CM

## 2014-09-03 DIAGNOSIS — Z9889 Other specified postprocedural states: Secondary | ICD-10-CM | POA: Insufficient documentation

## 2014-09-03 DIAGNOSIS — Z880 Allergy status to penicillin: Secondary | ICD-10-CM | POA: Diagnosis not present

## 2014-09-03 LAB — CBC WITH DIFFERENTIAL/PLATELET
Basophils Absolute: 0 10*3/uL (ref 0.0–0.1)
Basophils Relative: 0 % (ref 0–1)
Eosinophils Absolute: 0.1 10*3/uL (ref 0.0–0.7)
Eosinophils Relative: 1 % (ref 0–5)
HCT: 35.7 % — ABNORMAL LOW (ref 36.0–46.0)
Hemoglobin: 12.2 g/dL (ref 12.0–15.0)
Lymphocytes Relative: 25 % (ref 12–46)
Lymphs Abs: 2 10*3/uL (ref 0.7–4.0)
MCH: 30 pg (ref 26.0–34.0)
MCHC: 34.2 g/dL (ref 30.0–36.0)
MCV: 87.7 fL (ref 78.0–100.0)
Monocytes Absolute: 0.5 10*3/uL (ref 0.1–1.0)
Monocytes Relative: 7 % (ref 3–12)
Neutro Abs: 5.3 10*3/uL (ref 1.7–7.7)
Neutrophils Relative %: 67 % (ref 43–77)
Platelets: 188 10*3/uL (ref 150–400)
RBC: 4.07 MIL/uL (ref 3.87–5.11)
RDW: 12.9 % (ref 11.5–15.5)
WBC: 7.9 10*3/uL (ref 4.0–10.5)

## 2014-09-03 LAB — COMPREHENSIVE METABOLIC PANEL
ALT: 22 U/L (ref 0–35)
AST: 20 U/L (ref 0–37)
Albumin: 4.4 g/dL (ref 3.5–5.2)
Alkaline Phosphatase: 85 U/L (ref 39–117)
Anion gap: 5 (ref 5–15)
BUN: 10 mg/dL (ref 6–23)
CO2: 29 mmol/L (ref 19–32)
Calcium: 9.2 mg/dL (ref 8.4–10.5)
Chloride: 106 mEq/L (ref 96–112)
Creatinine, Ser: 0.94 mg/dL (ref 0.50–1.10)
GFR calc Af Amer: >90 mL/min (ref >90)
GFR calc non Af Amer: 78 mL/min — ABNORMAL LOW (ref >90)
Glucose, Bld: 91 mg/dL (ref 70–99)
Potassium: 3.6 mmol/L (ref 3.5–5.1)
Sodium: 140 mmol/L (ref 135–145)
Total Bilirubin: 0.7 mg/dL (ref 0.3–1.2)
Total Protein: 7.5 g/dL (ref 6.0–8.3)

## 2014-09-03 LAB — URINALYSIS, ROUTINE W REFLEX MICROSCOPIC
Bilirubin Urine: NEGATIVE
Glucose, UA: NEGATIVE mg/dL
Hgb urine dipstick: NEGATIVE
Ketones, ur: NEGATIVE mg/dL
Leukocytes, UA: NEGATIVE
Nitrite: NEGATIVE
Protein, ur: NEGATIVE mg/dL
Specific Gravity, Urine: 1.015 (ref 1.005–1.030)
Urobilinogen, UA: 0.2 mg/dL (ref 0.0–1.0)
pH: 6 (ref 5.0–8.0)

## 2014-09-03 MED ORDER — ONDANSETRON HCL 4 MG/2ML IJ SOLN
4.0000 mg | Freq: Once | INTRAMUSCULAR | Status: AC
  Administered 2014-09-03: 4 mg via INTRAVENOUS

## 2014-09-03 MED ORDER — ONDANSETRON HCL 4 MG/2ML IJ SOLN
4.0000 mg | Freq: Once | INTRAMUSCULAR | Status: DC
  Filled 2014-09-03: qty 2

## 2014-09-03 MED ORDER — IOHEXOL 300 MG/ML  SOLN
50.0000 mL | Freq: Once | INTRAMUSCULAR | Status: AC | PRN
  Administered 2014-09-03: 50 mL via ORAL

## 2014-09-03 MED ORDER — IOHEXOL 300 MG/ML  SOLN
100.0000 mL | Freq: Once | INTRAMUSCULAR | Status: AC | PRN
  Administered 2014-09-03: 100 mL via INTRAVENOUS

## 2014-09-03 NOTE — ED Notes (Signed)
Myer Peer, RN attempted x4 for IV access without success. Ruthy Dick, RN at bedside attempting IV access.

## 2014-09-03 NOTE — ED Notes (Signed)
Attempted to gain IV access x1 without success. Myer Peer, RN at bedside attempting to gain access.

## 2014-09-03 NOTE — Discharge Instructions (Signed)
Abdominal Pain  Many things can cause abdominal pain. Usually, abdominal pain is not caused by a disease and will improve without treatment. It can often be observed and treated at home. Your health care provider will do a physical exam and possibly order blood tests and X-rays to help determine the seriousness of your pain. However, in many cases, more time must pass before a clear cause of the pain can be found. Before that point, your health care provider may not know if you need more testing or further treatment.  HOME CARE INSTRUCTIONS   Monitor your abdominal pain for any changes. The following actions may help to alleviate any discomfort you are experiencing:  · Only take over-the-counter or prescription medicines as directed by your health care provider.  · Do not take laxatives unless directed to do so by your health care provider.  · Try a clear liquid diet (broth, tea, or water) as directed by your health care provider. Slowly move to a bland diet as tolerated.  SEEK MEDICAL CARE IF:  · You have unexplained abdominal pain.  · You have abdominal pain associated with nausea or diarrhea.  · You have pain when you urinate or have a bowel movement.  · You experience abdominal pain that wakes you in the night.  · You have abdominal pain that is worsened or improved by eating food.  · You have abdominal pain that is worsened with eating fatty foods.  · You have a fever.  SEEK IMMEDIATE MEDICAL CARE IF:   · Your pain does not go away within 2 hours.  · You keep throwing up (vomiting).  · Your pain is felt only in portions of the abdomen, such as the right side or the left lower portion of the abdomen.  · You pass bloody or black tarry stools.  MAKE SURE YOU:  · Understand these instructions.    · Will watch your condition.    · Will get help right away if you are not doing well or get worse.    Document Released: 05/17/2005 Document Revised: 08/12/2013 Document Reviewed: 04/16/2013  ExitCare® Patient Information  ©2015 [employer]. This information is not intended to replace advice given to you by your health care provider. Make sure you discuss any questions you have with your health care provider.        Ovarian Cyst  An ovarian cyst is a fluid-filled sac that forms on an ovary. The ovaries are small organs that produce eggs in women. Various types of cysts can form on the ovaries. Most are not cancerous. Many do not cause problems, and they often go away on their own. Some may cause symptoms and require treatment. Common types of ovarian cysts include:  · Functional cysts--These cysts may occur every month during the menstrual cycle. This is normal. The cysts usually go away with the next menstrual cycle if the woman does not get pregnant. Usually, there are no symptoms with a functional cyst.  · Endometrioma cysts--These cysts form from the tissue that lines the uterus. They are also called "chocolate cysts" because they become filled with blood that turns brown. This type of cyst can cause pain in the lower abdomen during intercourse and with your menstrual period.  · Cystadenoma cysts--This type develops from the cells on the outside of the ovary. These cysts can get very big and cause lower abdomen pain and pain with intercourse. This type of cyst can twist on itself, cut off its blood supply, and cause severe   pain. It can also easily rupture and cause a lot of pain.  · Dermoid cysts--This type of cyst is sometimes found in both ovaries. These cysts may contain different kinds of body tissue, such as skin, teeth, hair, or cartilage. They usually do not cause symptoms unless they get very big.  · Theca lutein cysts--These cysts occur when too much of a certain hormone (human chorionic gonadotropin) is produced and overstimulates the ovaries to produce an egg. This is most common after procedures used to assist with the conception of a baby (in vitro fertilization).  CAUSES   · Fertility drugs can cause a condition in  which multiple large cysts are formed on the ovaries. This is called ovarian hyperstimulation syndrome.  · A condition called polycystic ovary syndrome can cause hormonal imbalances that can lead to nonfunctional ovarian cysts.  SIGNS AND SYMPTOMS   Many ovarian cysts do not cause symptoms. If symptoms are present, they may include:  · Pelvic pain or pressure.  · Pain in the lower abdomen.  · Pain during sexual intercourse.  · Increasing girth (swelling) of the abdomen.  · Abnormal menstrual periods.  · Increasing pain with menstrual periods.  · Stopping having menstrual periods without being pregnant.  DIAGNOSIS   These cysts are commonly found during a routine or annual pelvic exam. Tests may be ordered to find out more about the cyst. These tests may include:  · Ultrasound.  · X-ray of the pelvis.  · CT scan.  · MRI.  · Blood tests.  TREATMENT   Many ovarian cysts go away on their own without treatment. Your health care provider may want to check your cyst regularly for 2-3 months to see if it changes. For women in menopause, it is particularly important to monitor a cyst closely because of the higher rate of ovarian cancer in menopausal women. When treatment is needed, it may include any of the following:  · A procedure to drain the cyst (aspiration). This may be done using a long needle and ultrasound. It can also be done through a laparoscopic procedure. This involves using a thin, lighted tube with a tiny camera on the end (laparoscope) inserted through a small incision.  · Surgery to remove the whole cyst. This may be done using laparoscopic surgery or an open surgery involving a larger incision in the lower abdomen.  · Hormone treatment or birth control pills. These methods are sometimes used to help dissolve a cyst.  HOME CARE INSTRUCTIONS   · Only take over-the-counter or prescription medicines as directed by your health care provider.  · Follow up with your health care provider as directed.  · Get  regular pelvic exams and Pap tests.  SEEK MEDICAL CARE IF:   · Your periods are late, irregular, or painful, or they stop.  · Your pelvic pain or abdominal pain does not go away.  · Your abdomen becomes larger or swollen.  · You have pressure on your bladder or trouble emptying your bladder completely.  · You have pain during sexual intercourse.  · You have feelings of fullness, pressure, or discomfort in your stomach.  · You lose weight for no apparent reason.  · You feel generally ill.  · You become constipated.  · You lose your appetite.  · You develop acne.  · You have an increase in body and facial hair.  · You are gaining weight, without changing your exercise and eating habits.  · You think you are pregnant.    SEEK IMMEDIATE MEDICAL CARE IF:   · You have increasing abdominal pain.  · You feel sick to your stomach (nauseous), and you throw up (vomit).  · You develop a fever that comes on suddenly.  · You have abdominal pain during a bowel movement.  · Your menstrual periods become heavier than usual.  MAKE SURE YOU:  · Understand these instructions.  · Will watch your condition.  · Will get help right away if you are not doing well or get worse.  Document Released: 08/07/2005 Document Revised: 08/12/2013 Document Reviewed: 04/14/2013  ExitCare® Patient Information ©2015 [employer]. This information is not intended to replace advice given to you by your health care provider. Make sure you discuss any questions you have with your health care provider.
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
## 2014-09-03 NOTE — ED Notes (Addendum)
Pt reports RLQ pain x2 days. Pt denies n/v/d. Pt reports pain increased with bending and moving.

## 2014-12-06 ENCOUNTER — Emergency Department (HOSPITAL_COMMUNITY)
Admission: EM | Admit: 2014-12-06 | Discharge: 2014-12-06 | Disposition: A | Discharge status: Home / Self Care | Payer: PRIVATE HEALTH INSURANCE | Attending: Emergency Medicine | Admitting: Emergency Medicine

## 2014-12-06 ENCOUNTER — Encounter (HOSPITAL_COMMUNITY): Payer: Self-pay | Admitting: Emergency Medicine

## 2014-12-06 DIAGNOSIS — Z79899 Other long term (current) drug therapy: Secondary | ICD-10-CM | POA: Insufficient documentation

## 2014-12-06 DIAGNOSIS — J449 Chronic obstructive pulmonary disease, unspecified: Secondary | ICD-10-CM | POA: Insufficient documentation

## 2014-12-06 DIAGNOSIS — Z7952 Long term (current) use of systemic steroids: Secondary | ICD-10-CM | POA: Insufficient documentation

## 2014-12-06 DIAGNOSIS — Z8541 Personal history of malignant neoplasm of cervix uteri: Secondary | ICD-10-CM | POA: Insufficient documentation

## 2014-12-06 DIAGNOSIS — Z880 Allergy status to penicillin: Secondary | ICD-10-CM | POA: Insufficient documentation

## 2014-12-06 DIAGNOSIS — L03012 Cellulitis of left finger: Secondary | ICD-10-CM

## 2014-12-06 MED ORDER — IBUPROFEN 800 MG PO TABS
800.0000 mg | ORAL_TABLET | Freq: Three times a day (TID) | ORAL | Status: DC
Start: 2014-12-06 — End: 2015-05-22

## 2014-12-06 MED ORDER — CLINDAMYCIN HCL 150 MG PO CAPS: 300.0000 mg | ORAL_CAPSULE | Freq: Three times a day (TID) | ORAL | Status: DC

## 2014-12-06 MED ORDER — BUPIVACAINE HCL (PF) 0.5 % IJ SOLN
10.0000 mL | Freq: Once | INTRAMUSCULAR | Status: AC
  Administered 2014-12-06: 10 mL
  Filled 2014-12-06: qty 30

## 2014-12-06 NOTE — ED Notes (Signed)
Pt has infection to left thumb with swelling and redness present.

## 2014-12-06 NOTE — Discharge Instructions (Signed)

## 2014-12-06 NOTE — ED Provider Notes (Signed)
CSN: [id number]     Arrival date & time 12/06/14  1438 History  This chart was scribed for a non-physician practitioner, Charlann Lange, PA-C working with Francine Graven, DO by Martinique Peace, ED Scribe. The patient was seen in APFT24/APFT24. The patient's care was started at 2:51 PM.    Chief Complaint  Patient presents with  . Hand Pain      Patient is a 34 y.o. female presenting with hand pain. The history is provided by the patient. No language interpreter was used.  Hand Pain  HPI Comments: Mary [name] is a 34 y.o. female who presents to the Emergency Department complaining of infection to left hand x 2-3 days. Pt notes affected area is painful, reddened, swollen, and numb. She adds that area is also itchy and and she is not able to flex finger fully due to pain. Pt denies any draining from site.    Past Medical History  Diagnosis Date  . S/P partial hysterectomy   . Cancer of cervix   . Asthma   . COPD (chronic obstructive pulmonary disease)   . Physiological ovarian cysts    Past Surgical History  Procedure Laterality Date  . Abdominal hysterectomy    . Eye surgery      Artificial right eye  . L ovarian removal Left    Family History  Problem Relation Age of Onset  . Stroke Mother     TIA  . Stroke Father   . Hypertension Brother   . Asthma Daughter   . Sleep apnea Son   . Diabetes Maternal Grandmother   . Cancer Paternal Grandmother   . Cancer Brother     lung and thyroid   History  Substance Use Topics  . Smoking status: Never Smoker   . Smokeless tobacco: Never Used  . Alcohol Use: No   OB History    No data available     Review of Systems  Skin: Positive for color change.       Infection to left thumb. Swelling noted.   Neurological: Positive for numbness (left hand).      Allergies  Buprenorphine hcl; Morphine and related; Penicillins; and Vicodin  Home Medications   Prior to Admission medications   Medication Sig Start Date End  Date Taking? Authorizing Provider  albuterol (PROVENTIL HFA;VENTOLIN HFA) 108 (90 BASE) MCG/ACT inhaler Inhale 2 puffs into the lungs as needed for wheezing or shortness of breath.     Historical Provider, MD  benzonatate (TESSALON) 100 MG capsule Take 100 mg by mouth 3 (three) times daily as needed for cough.    Historical Provider, MD  cyclobenzaprine (FLEXERIL) 5 MG tablet Take 5 mg by mouth 3 (three) times daily as needed for muscle spasms.    Historical Provider, MD  DULoxetine (CYMBALTA) 60 MG capsule Take 60 mg by mouth daily.    Historical Provider, MD  Fluticasone-Salmeterol (ADVAIR) 250-50 MCG/DOSE AEPB Inhale 1 puff into the lungs 2 (two) times daily as needed (for shortness of breath).    Historical Provider, MD  ibuprofen (ADVIL,MOTRIN) 800 MG tablet Take 1 tablet (800 mg total) by mouth 3 (three) times daily. For pain 01/02/14   [name], MD  lisinopril (PRINIVIL,ZESTRIL) 20 MG tablet Take 20 mg by mouth daily.    Historical Provider, MD  metroNIDAZOLE (FLAGYL) 500 MG tablet Take 4 tablets (2,000 mg total) by mouth once. Patient not taking: Reported on 09/03/2014 03/19/14   [name], CNM  ondansetron (ZOFRAN) 4 MG tablet  Take 4 mg by mouth every 8 (eight) hours as needed for nausea or vomiting.    Historical Provider, MD  oxycodone (OXY-IR) 5 MG capsule Take 2.5-5 mg by mouth 3 (three) times daily as needed for pain.    Historical Provider, MD  predniSONE (DELTASONE) 10 MG tablet Take 10 mg by mouth 2 (two) times daily.    Historical Provider, MD   BP 131/85 mmHg  Pulse 69  Temp(Src) 97.7 F (36.5 C) (Oral)  Resp 18  Ht 5\' 9"  (1.753 m)  Wt 286 lb (129.729 kg)  BMI 42.22 kg/m2  SpO2 97% Physical Exam  Constitutional: She is oriented to person, place, and time. She appears well-developed and well-nourished. No distress.  HENT:  Head: Normocephalic and atraumatic.  Eyes: Conjunctivae and EOM are normal.  Neck: Neck supple. No tracheal deviation present.   Cardiovascular: Normal rate.   Pulmonary/Chest: Effort normal.  Musculoskeletal: Normal range of motion. She exhibits tenderness.  Left thumb- reddened and mildly swollen around lateral cuticle. Consistent with perionychia. No subungual hematoma. Pad of finger is tender but soft.   Neurological: She is alert and oriented to person, place, and time.  Skin: Skin is warm and dry.  Psychiatric: She has a normal mood and affect. Her behavior is normal.  Nursing note and vitals reviewed.   ED Course  Procedures (including critical care time) Labs Review Labs Reviewed - No data to display  Imaging Review No results found.   EKG Interpretation None     Medications - No data to display  2:54 PM- Treatment plan was discussed with patient who verbalizes understanding and agrees.   Digital block performed using 0.5% Marcaine, to left thumb, with good anesthesia. #11 blade used to open paronychia with moderate purulent discharge.  MDM   Final diagnoses:  None    1. Paronychia, left thumb  Uncomplicated cuticle infection opened via above procedure note.   I personally performed the services described in this documentation, which was scribed in my presence. The recorded information has been reviewed and is accurate.     Charlann Lange, PA-C 12/06/14 [place], DO 12/07/14 2057

## 2015-05-22 ENCOUNTER — Encounter (HOSPITAL_COMMUNITY): Payer: Self-pay | Admitting: *Deleted

## 2015-05-22 ENCOUNTER — Emergency Department (HOSPITAL_COMMUNITY)
Admission: EM | Admit: 2015-05-22 | Discharge: 2015-05-22 | Disposition: A | Discharge status: Home / Self Care | Payer: PRIVATE HEALTH INSURANCE | Attending: Emergency Medicine | Admitting: Emergency Medicine

## 2015-05-22 ENCOUNTER — Emergency Department (HOSPITAL_COMMUNITY): Payer: PRIVATE HEALTH INSURANCE

## 2015-05-22 DIAGNOSIS — Z91040 Latex allergy status: Secondary | ICD-10-CM | POA: Insufficient documentation

## 2015-05-22 DIAGNOSIS — Z791 Long term (current) use of non-steroidal anti-inflammatories (NSAID): Secondary | ICD-10-CM | POA: Insufficient documentation

## 2015-05-22 DIAGNOSIS — Z8541 Personal history of malignant neoplasm of cervix uteri: Secondary | ICD-10-CM | POA: Insufficient documentation

## 2015-05-22 DIAGNOSIS — Y9289 Other specified places as the place of occurrence of the external cause: Secondary | ICD-10-CM | POA: Insufficient documentation

## 2015-05-22 DIAGNOSIS — Z792 Long term (current) use of antibiotics: Secondary | ICD-10-CM | POA: Insufficient documentation

## 2015-05-22 DIAGNOSIS — Y998 Other external cause status: Secondary | ICD-10-CM | POA: Insufficient documentation

## 2015-05-22 DIAGNOSIS — W208XXA Other cause of strike by thrown, projected or falling object, initial encounter: Secondary | ICD-10-CM | POA: Insufficient documentation

## 2015-05-22 DIAGNOSIS — Z880 Allergy status to penicillin: Secondary | ICD-10-CM | POA: Insufficient documentation

## 2015-05-22 DIAGNOSIS — Z79899 Other long term (current) drug therapy: Secondary | ICD-10-CM | POA: Insufficient documentation

## 2015-05-22 DIAGNOSIS — Z8742 Personal history of other diseases of the female genital tract: Secondary | ICD-10-CM | POA: Insufficient documentation

## 2015-05-22 DIAGNOSIS — S8011XA Contusion of right lower leg, initial encounter: Secondary | ICD-10-CM | POA: Insufficient documentation

## 2015-05-22 DIAGNOSIS — J449 Chronic obstructive pulmonary disease, unspecified: Secondary | ICD-10-CM | POA: Insufficient documentation

## 2015-05-22 DIAGNOSIS — Y9389 Activity, other specified: Secondary | ICD-10-CM | POA: Insufficient documentation

## 2015-05-22 MED ORDER — IBUPROFEN 800 MG PO TABS: 800.0000 mg | ORAL_TABLET | Freq: Three times a day (TID) | ORAL | Status: DC

## 2015-05-22 MED ORDER — TRAMADOL HCL 50 MG PO TABS: 50.0000 mg | ORAL_TABLET | Freq: Four times a day (QID) | ORAL | Status: DC | PRN

## 2015-05-22 MED ORDER — TRAMADOL HCL 50 MG PO TABS
50.0000 mg | ORAL_TABLET | Freq: Once | ORAL | Status: AC
  Administered 2015-05-22: 50 mg via ORAL
  Filled 2015-05-22: qty 1

## 2015-05-22 MED ORDER — IBUPROFEN 800 MG PO TABS
800.0000 mg | ORAL_TABLET | Freq: Once | ORAL | Status: AC
  Administered 2015-05-22: 800 mg via ORAL
  Filled 2015-05-22: qty 1
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
## 2015-05-22 NOTE — Discharge Instructions (Signed)
Contusion °A contusion is a deep bruise. Contusions happen when an injury causes bleeding under the skin. Signs of bruising include pain, puffiness (swelling), and discolored skin. The contusion may turn blue, purple, or yellow. °HOME CARE  °· Put ice on the injured area. °¨ Put ice in a plastic bag. °¨ Place a towel between your skin and the bag. °¨ Leave the ice on for 15-20 minutes, 03-04 times a day. °· Only take medicine as told by your doctor. °· Rest the injured area. °· If possible, raise (elevate) the injured area to lessen puffiness. °GET HELP RIGHT AWAY IF:  °· You have more bruising or puffiness. °· You have pain that is getting worse. °· Your puffiness or pain is not helped by medicine. °MAKE SURE YOU:  °· Understand these instructions. °· Will watch your condition. °· Will get help right away if you are not doing well or get worse. °Document Released: 01/24/2008 Document Revised: 10/30/2011 Document Reviewed: 06/12/2011 °ExitCare® Patient Information ©2015 [employer]. This information is not intended to replace advice given to you by your health care provider. Make sure you discuss any questions you have with your health care provider. ° °
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
## 2015-05-22 NOTE — ED Notes (Signed)
Discharge instructions and prescriptions reviewed with pt . Instructed to apply ice pack off and on too leg to assist with pain relief, verbalized understanding and ambulated off unit with ice pack in hand

## 2015-07-19 ENCOUNTER — Telehealth: Payer: Self-pay | Admitting: *Deleted

## 2015-07-19 NOTE — Telephone Encounter (Signed)
Pt c/o vaginal bleeding x 2 days, had a partial hysterectomy in 2005. Pt has [employer]. Pt informed [employer] would not cover this visit would need to pay out of pocket. Call transferred to front desk to discuss cost.

## 2015-07-26 DIAGNOSIS — Z792 Long term (current) use of antibiotics: Secondary | ICD-10-CM | POA: Insufficient documentation

## 2015-07-26 DIAGNOSIS — Z91040 Latex allergy status: Secondary | ICD-10-CM | POA: Insufficient documentation

## 2015-07-26 DIAGNOSIS — W208XXD Other cause of strike by thrown, projected or falling object, subsequent encounter: Secondary | ICD-10-CM | POA: Insufficient documentation

## 2015-07-26 DIAGNOSIS — Z8742 Personal history of other diseases of the female genital tract: Secondary | ICD-10-CM | POA: Insufficient documentation

## 2015-07-26 DIAGNOSIS — Z8541 Personal history of malignant neoplasm of cervix uteri: Secondary | ICD-10-CM | POA: Insufficient documentation

## 2015-07-26 DIAGNOSIS — J449 Chronic obstructive pulmonary disease, unspecified: Secondary | ICD-10-CM | POA: Insufficient documentation

## 2015-07-26 DIAGNOSIS — S8011XD Contusion of right lower leg, subsequent encounter: Secondary | ICD-10-CM | POA: Insufficient documentation

## 2015-07-26 DIAGNOSIS — Z880 Allergy status to penicillin: Secondary | ICD-10-CM | POA: Insufficient documentation

## 2015-07-26 DIAGNOSIS — Z791 Long term (current) use of non-steroidal anti-inflammatories (NSAID): Secondary | ICD-10-CM | POA: Insufficient documentation

## 2015-07-26 DIAGNOSIS — Z79899 Other long term (current) drug therapy: Secondary | ICD-10-CM | POA: Insufficient documentation

## 2015-07-27 ENCOUNTER — Encounter (HOSPITAL_COMMUNITY): Payer: Self-pay | Admitting: Emergency Medicine

## 2015-07-27 ENCOUNTER — Emergency Department (HOSPITAL_COMMUNITY)
Admission: EM | Admit: 2015-07-27 | Discharge: 2015-07-27 | Disposition: A | Discharge status: Home / Self Care | Payer: PRIVATE HEALTH INSURANCE | Attending: Emergency Medicine | Admitting: Emergency Medicine

## 2015-07-27 ENCOUNTER — Emergency Department (HOSPITAL_COMMUNITY): Payer: PRIVATE HEALTH INSURANCE

## 2015-07-27 DIAGNOSIS — S8011XD Contusion of right lower leg, subsequent encounter: Secondary | ICD-10-CM

## 2015-07-27 NOTE — Discharge Instructions (Signed)
Wear the ace wrap for comfort and elevate the leg when possible. Continue to take ibuprofen. If symptoms persist follow up with Dr. Luna Glasgow. Return here as needed.
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
## 2015-08-18 ENCOUNTER — Encounter (HOSPITAL_COMMUNITY): Payer: Self-pay | Admitting: Emergency Medicine

## 2015-08-18 ENCOUNTER — Emergency Department (HOSPITAL_COMMUNITY)
Admission: EM | Admit: 2015-08-18 | Discharge: 2015-08-18 | Disposition: A | Discharge status: Home / Self Care | Payer: PRIVATE HEALTH INSURANCE | Attending: Emergency Medicine | Admitting: Emergency Medicine

## 2015-08-18 DIAGNOSIS — Z880 Allergy status to penicillin: Secondary | ICD-10-CM | POA: Insufficient documentation

## 2015-08-18 DIAGNOSIS — M25512 Pain in left shoulder: Secondary | ICD-10-CM | POA: Insufficient documentation

## 2015-08-18 DIAGNOSIS — J449 Chronic obstructive pulmonary disease, unspecified: Secondary | ICD-10-CM | POA: Insufficient documentation

## 2015-08-18 DIAGNOSIS — M25511 Pain in right shoulder: Secondary | ICD-10-CM | POA: Insufficient documentation

## 2015-08-18 DIAGNOSIS — E663 Overweight: Secondary | ICD-10-CM | POA: Insufficient documentation

## 2015-08-18 DIAGNOSIS — Z79899 Other long term (current) drug therapy: Secondary | ICD-10-CM | POA: Insufficient documentation

## 2015-08-18 DIAGNOSIS — Z91040 Latex allergy status: Secondary | ICD-10-CM | POA: Insufficient documentation

## 2015-08-18 DIAGNOSIS — M542 Cervicalgia: Secondary | ICD-10-CM | POA: Insufficient documentation

## 2015-08-18 DIAGNOSIS — Z8541 Personal history of malignant neoplasm of cervix uteri: Secondary | ICD-10-CM | POA: Insufficient documentation

## 2015-08-18 MED ORDER — OXYCODONE-ACETAMINOPHEN 5-325 MG PO TABS
1.0000 | ORAL_TABLET | Freq: Once | ORAL | Status: AC
  Administered 2015-08-18: 1 via ORAL
  Filled 2015-08-18: qty 1

## 2015-08-18 MED ORDER — DIAZEPAM 10 MG PO TABS: 10.0000 mg | ORAL_TABLET | Freq: Three times a day (TID) | ORAL | Status: DC | PRN

## 2015-08-18 MED ORDER — OXYCODONE-ACETAMINOPHEN 5-325 MG PO TABS
1.0000 | ORAL_TABLET | ORAL | Status: DC | PRN
Start: 2015-08-18 — End: 2016-09-05

## 2015-08-18 MED ORDER — PREDNISONE 20 MG PO TABS: 20.0000 mg | ORAL_TABLET | Freq: Two times a day (BID) | ORAL | Status: DC

## 2015-08-18 NOTE — ED Notes (Signed)
Pt c/o head pressure to the back of head that radiates to neck x 2 months.

## 2015-08-18 NOTE — Discharge Instructions (Signed)

## 2015-08-18 NOTE — ED Notes (Signed)
Dr Wentz at bedside,  

## 2015-08-18 NOTE — ED Provider Notes (Signed)
CSN: [id number]     Arrival date & time 08/18/15  [id number] History   First MD Initiated Contact with Patient 08/18/15 0451     Chief Complaint  Patient presents with  . Headache     (Consider location/radiation/quality/duration/timing/severity/associated sxs/prior Treatment) HPI   She presents for evaluation of neck pain, bilateral lower, which is worse with movement, for 2 months. She states it is related to "fluid in my brain". She denies fever, chills, nausea or vomiting. She is not currently taking her chronic pain medicine, for the last 3 months. She denies injury to head or neck recently. 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 There are no other known modifying factors.  Past Medical History  Diagnosis Date  . S/P partial hysterectomy   . Cancer of cervix ([place])   . Asthma   . COPD (chronic obstructive pulmonary disease) ([place])   . Physiological ovarian cysts    Past Surgical History  Procedure Laterality Date  . Abdominal hysterectomy    . Eye surgery      Artificial right eye  . L ovarian removal Left    Family History  Problem Relation Age of Onset  . Stroke Mother     TIA  . Stroke Father   . Hypertension Brother   . Asthma Daughter   . Sleep apnea Son   . Diabetes Maternal Grandmother   . Cancer Paternal Grandmother   . Cancer Brother     lung and thyroid   Social History  Substance Use Topics  . Smoking status: Never Smoker   . Smokeless tobacco: Never Used  . Alcohol Use: No   OB History    No data available     Review of Systems  All other systems reviewed and are negative.     Allergies  Buprenorphine hcl; Morphine and related; Penicillins; Vicodin; and Latex  Home Medications   Prior to Admission medications   Medication Sig Start Date End Date Taking? Authorizing Provider  albuterol (PROVENTIL HFA;VENTOLIN HFA) 108 (90 BASE) MCG/ACT inhaler Inhale 2 puffs into the lungs as needed for wheezing or  shortness of breath.    Yes Historical Provider, MD  Fluticasone-Salmeterol (ADVAIR) 250-50 MCG/DOSE AEPB Inhale 1 puff into the lungs 2 (two) times daily as needed (for shortness of breath).   Yes Historical Provider, MD  lisinopril (PRINIVIL,ZESTRIL) 20 MG tablet Take 20 mg by mouth daily.   Yes Historical Provider, MD  diazepam (VALIUM) 10 MG tablet Take 1 tablet (10 mg total) by mouth every 8 (eight) hours as needed (Muscle spasm). 08/18/15   Daleen Bo, MD  oxyCODONE-acetaminophen (PERCOCET) 5-325 MG tablet Take 1 tablet by mouth every 4 (four) hours as needed for severe pain. 08/18/15   Daleen Bo, MD  predniSONE (DELTASONE) 20 MG tablet Take 1 tablet (20 mg total) by mouth 2 (two) times daily. 08/18/15   Daleen Bo, MD   BP 140/101 mmHg  Pulse 72  Temp(Src) 97.6 F (36.4 C)  Resp 18  Ht 5\' 9"  (1.753 m)  Wt 275 lb (124.739 kg)  BMI 40.59 kg/m2  SpO2 100% Physical Exam  Constitutional: She is oriented to person, place, and time. She appears well-developed and well-nourished. No distress.  She is overweight  HENT:  Head: Normocephalic and atraumatic.  Right Ear: External ear normal.  Left Ear: External ear normal.  Eyes: Conjunctivae are normal.  Prosthetic right eye  Neck: Normal range of motion and phonation normal. Neck  supple.  There is no meningismus  Cardiovascular: Normal rate, regular rhythm and normal heart sounds.   Pulmonary/Chest: Effort normal and breath sounds normal. She exhibits no bony tenderness.  Abdominal: Soft. There is no tenderness.  Musculoskeletal: Normal range of motion.  Tender bilateral trapezius muscles.  Neurological: She is alert and oriented to person, place, and time. No cranial nerve deficit or sensory deficit. She exhibits normal muscle tone. Coordination normal.  Skin: Skin is warm, dry and intact.  Psychiatric: She has a normal mood and affect. Her behavior is normal. Judgment and thought content normal.  Nursing note and vitals  reviewed.   ED Course  Procedures (including critical care time) Medications  oxyCODONE-acetaminophen (PERCOCET/ROXICET) 5-325 MG per tablet 1 tablet (not administered)    Patient Vitals for the past 24 hrs:  BP Temp Pulse Resp SpO2 Height Weight  08/18/15 0425 (!) 140/101 mmHg 97.6 F (36.4 C) 72 18 100 % 5\' 9"  (1.753 m) 275 lb (124.739 kg)    5:39 AM Reevaluation with update and discussion. After initial assessment and treatment, an updated evaluation reveals no additional complains. Findings discussed with patient and family members, all questions were answered. [place] Review Labs Reviewed - No data to display  Imaging Review No results found. I have personally reviewed and evaluated these images and lab results as part of my medical decision-making.   EKG Interpretation None      MDM   Final diagnoses:  Neck pain    Nonspecific neck pain, likely musculoskeletal with spasm. Doubt spinal myelopathy, herniated disc or fracture. This is unlikely to be related to a intracranial process.  Nursing Notes Reviewed/ Care Coordinated Applicable Imaging Reviewed Interpretation of Laboratory Data incorporated into ED treatment  The patient appears reasonably screened and/or stabilized for discharge and I doubt any other medical condition or other [facility] requiring further screening, evaluation, or treatment in the ED at this time prior to discharge.  Plan: Home Medications- Percocet, Prednisone, Valium; Home Treatments- rest, heat; return here if the recommended treatment, does not improve the symptoms; Recommended follow up- PCP prn   Daleen Bo, MD 08/18/15 [phone]

## 2015-12-13 ENCOUNTER — Emergency Department (HOSPITAL_COMMUNITY): Payer: Self-pay

## 2015-12-13 ENCOUNTER — Emergency Department (HOSPITAL_COMMUNITY)
Admission: EM | Admit: 2015-12-13 | Discharge: 2015-12-13 | Disposition: A | Discharge status: Home / Self Care | Payer: Self-pay | Attending: Emergency Medicine | Admitting: Emergency Medicine

## 2015-12-13 ENCOUNTER — Encounter (HOSPITAL_COMMUNITY): Payer: Self-pay | Admitting: *Deleted

## 2015-12-13 DIAGNOSIS — J45909 Unspecified asthma, uncomplicated: Secondary | ICD-10-CM | POA: Insufficient documentation

## 2015-12-13 DIAGNOSIS — R1031 Right lower quadrant pain: Secondary | ICD-10-CM | POA: Insufficient documentation

## 2015-12-13 DIAGNOSIS — J449 Chronic obstructive pulmonary disease, unspecified: Secondary | ICD-10-CM | POA: Insufficient documentation

## 2015-12-13 DIAGNOSIS — Z8541 Personal history of malignant neoplasm of cervix uteri: Secondary | ICD-10-CM | POA: Insufficient documentation

## 2015-12-13 DIAGNOSIS — Z79899 Other long term (current) drug therapy: Secondary | ICD-10-CM | POA: Insufficient documentation

## 2015-12-13 LAB — URINE MICROSCOPIC-ADD ON

## 2015-12-13 LAB — CBC WITH DIFFERENTIAL/PLATELET
Basophils Absolute: 0 10*3/uL (ref 0.0–0.1)
Basophils Relative: 0 %
Eosinophils Absolute: 0.1 10*3/uL (ref 0.0–0.7)
Eosinophils Relative: 1 %
HCT: 36.6 % (ref 36.0–46.0)
Hemoglobin: 12.5 g/dL (ref 12.0–15.0)
Lymphocytes Relative: 25 %
Lymphs Abs: 2.1 10*3/uL (ref 0.7–4.0)
MCH: 29.6 pg (ref 26.0–34.0)
MCHC: 34.2 g/dL (ref 30.0–36.0)
MCV: 86.7 fL (ref 78.0–100.0)
Monocytes Absolute: 0.5 10*3/uL (ref 0.1–1.0)
Monocytes Relative: 6 %
Neutro Abs: 5.6 10*3/uL (ref 1.7–7.7)
Neutrophils Relative %: 68 %
Platelets: 199 10*3/uL (ref 150–400)
RBC: 4.22 MIL/uL (ref 3.87–5.11)
RDW: 13.2 % (ref 11.5–15.5)
WBC: 8.3 10*3/uL (ref 4.0–10.5)

## 2015-12-13 LAB — URINALYSIS, ROUTINE W REFLEX MICROSCOPIC
Bilirubin Urine: NEGATIVE
Glucose, UA: NEGATIVE mg/dL
Hgb urine dipstick: NEGATIVE
Ketones, ur: NEGATIVE mg/dL
Nitrite: NEGATIVE
Protein, ur: NEGATIVE mg/dL
Specific Gravity, Urine: >1.03 — ABNORMAL HIGH (ref 1.005–1.030)
pH: 5.5 (ref 5.0–8.0)

## 2015-12-13 LAB — BASIC METABOLIC PANEL
Anion gap: 9 (ref 5–15)
BUN: 9 mg/dL (ref 6–20)
CO2: 25 mmol/L (ref 22–32)
Calcium: 9.1 mg/dL (ref 8.9–10.3)
Chloride: 105 mmol/L (ref 101–111)
Creatinine, Ser: 0.98 mg/dL (ref 0.44–1.00)
GFR calc Af Amer: >60 mL/min (ref >60)
GFR calc non Af Amer: >60 mL/min (ref >60)
Glucose, Bld: 101 mg/dL — ABNORMAL HIGH (ref 65–99)
Potassium: 3.6 mmol/L (ref 3.5–5.1)
Sodium: 139 mmol/L (ref 135–145)

## 2015-12-13 MED ORDER — SODIUM CHLORIDE 0.9 % IV BOLUS (SEPSIS)
1000.0000 mL | Freq: Once | INTRAVENOUS | Status: AC
  Administered 2015-12-13: 1000 mL via INTRAVENOUS

## 2015-12-13 MED ORDER — KETOROLAC TROMETHAMINE 30 MG/ML IJ SOLN
30.0000 mg | Freq: Once | INTRAMUSCULAR | Status: AC
  Administered 2015-12-13: 30 mg via INTRAVENOUS
  Filled 2015-12-13: qty 1

## 2015-12-13 MED ORDER — ONDANSETRON HCL 4 MG/2ML IJ SOLN
4.0000 mg | Freq: Once | INTRAMUSCULAR | Status: AC
  Administered 2015-12-13: 4 mg via INTRAVENOUS
  Filled 2015-12-13: qty 2

## 2015-12-13 NOTE — ED Notes (Signed)
Patient given discharge instruction, verbalized understand. IV removed, band aid applied. Patient ambulatory out of the department.  

## 2015-12-13 NOTE — ED Notes (Signed)
Pt c/o sore throat and right side abdominal pain that started x 1 day ago; pt has had nausea, denies any vomiting or diarrhea or fevers

## 2015-12-13 NOTE — ED Notes (Signed)
Took more urine to lab for urinalysis, per lab needs more
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
## 2015-12-13 NOTE — Discharge Instructions (Signed)
Ibuprofen 600 mg every 6 hours as needed for pain.  Return to the emergency department if your symptoms significantly worsen or change, and follow-up with your primary Dr. if you're not improving in the next few days.   Abdominal Pain, Adult Many things can cause abdominal pain. Usually, abdominal pain is not caused by a disease and will improve without treatment. It can often be observed and treated at home. Your health care provider will do a physical exam and possibly order blood tests and X-rays to help determine the seriousness of your pain. However, in many cases, more time must pass before a clear cause of the pain can be found. Before that point, your health care provider may not know if you need more testing or further treatment. HOME CARE INSTRUCTIONS Monitor your abdominal pain for any changes. The following actions may help to alleviate any discomfort you are experiencing:  Only take over-the-counter or prescription medicines as directed by your health care provider.  Do not take laxatives unless directed to do so by your health care provider.  Try a clear liquid diet (broth, tea, or water) as directed by your health care provider. Slowly move to a bland diet as tolerated. SEEK MEDICAL CARE IF:  You have unexplained abdominal pain.  You have abdominal pain associated with nausea or diarrhea.  You have pain when you urinate or have a bowel movement.  You experience abdominal pain that wakes you in the night.  You have abdominal pain that is worsened or improved by eating food.  You have abdominal pain that is worsened with eating fatty foods.  You have a fever. SEEK IMMEDIATE MEDICAL CARE IF:  Your pain does not go away within 2 hours.  You keep throwing up (vomiting).  Your pain is felt only in portions of the abdomen, such as the right side or the left lower portion of the abdomen.  You pass bloody or black tarry stools. MAKE SURE YOU:  Understand these  instructions.  Will watch your condition.  Will get help right away if you are not doing well or get worse.   This information is not intended to replace advice given to you by your health care provider. Make sure you discuss any questions you have with your health care provider.   Document Released: 05/17/2005 Document Revised: 04/28/2015 Document Reviewed: 04/16/2013 Elsevier Interactive Patient Education [employer].

## 2016-05-24 ENCOUNTER — Ambulatory Visit: Payer: Medicaid Other | Admitting: Family Medicine

## 2016-05-29 ENCOUNTER — Encounter: Payer: Self-pay | Admitting: Family Medicine

## 2016-07-03 ENCOUNTER — Encounter (HOSPITAL_COMMUNITY): Payer: Self-pay

## 2016-07-03 ENCOUNTER — Emergency Department (HOSPITAL_COMMUNITY)
Admission: EM | Admit: 2016-07-03 | Discharge: 2016-07-03 | Disposition: A | Discharge status: Home / Self Care | Payer: PRIVATE HEALTH INSURANCE | Attending: Emergency Medicine | Admitting: Emergency Medicine

## 2016-07-03 DIAGNOSIS — J449 Chronic obstructive pulmonary disease, unspecified: Secondary | ICD-10-CM | POA: Insufficient documentation

## 2016-07-03 DIAGNOSIS — J45909 Unspecified asthma, uncomplicated: Secondary | ICD-10-CM | POA: Insufficient documentation

## 2016-07-03 DIAGNOSIS — J069 Acute upper respiratory infection, unspecified: Secondary | ICD-10-CM | POA: Insufficient documentation

## 2016-07-03 DIAGNOSIS — Z79899 Other long term (current) drug therapy: Secondary | ICD-10-CM | POA: Insufficient documentation

## 2016-07-03 MED ORDER — AZITHROMYCIN 250 MG PO TABS: ORAL_TABLET | ORAL | 0 refills | Status: DC

## 2016-07-03 NOTE — Discharge Instructions (Signed)
Begin taking Zithromax if your symptoms are not improving in the next 2 days.  Continue over-the-counter medications as needed for symptom relief.  Return to the emergency department if you develop chest pain, worsening breathing, or other new and concerning symptoms.

## 2016-07-03 NOTE — ED Provider Notes (Signed)
[facility] DEPT Provider Note   CSN: [id number] Arrival date & time: 07/03/16  [id number]  By signing my name below, I, Higinio Plan, attest that this documentation has been prepared under the direction and in the presence of Veryl Speak, MD . Electronically Signed: Higinio Plan, Scribe. 07/03/2016. 9:41 AM.  History   Chief Complaint Chief Complaint  Patient presents with  . Cough    congestion   The history is provided by the patient. No language interpreter was used.   HPI Comments: Mary [name] is a 35 y.o. female with PMHx of COPD and asthma, who presents to the Emergency Department complaining of gradually worsening, productive cough and congestion that began 4 days ago. Pt reports associated sore throat, chills and chest pain secondary to her cough. She denies fever, sick contacts and hx of smoking or EtOH use.   Past Medical History:  Diagnosis Date  . Asthma   . Cancer of cervix ([place])   . COPD (chronic obstructive pulmonary disease) ([place])   . Physiological ovarian cysts   . S/P partial hysterectomy     Patient Active Problem List   Diagnosis Date Noted  . Right ovarian cyst 03/19/2014  . Trichimoniasis 03/19/2014  . DYSPNEA 11/26/2009  . ASTHMA 11/23/2009    Past Surgical History:  Procedure Laterality Date  . ABDOMINAL HYSTERECTOMY    . EYE SURGERY     Artificial right eye  . L ovarian removal Left     OB History    No data available     Home Medications    Prior to Admission medications   Medication Sig Start Date End Date Taking? Authorizing Provider  albuterol (PROVENTIL HFA;VENTOLIN HFA) 108 (90 BASE) MCG/ACT inhaler Inhale 2 puffs into the lungs as needed for wheezing or shortness of breath.     Historical Provider, MD  diazepam (VALIUM) 10 MG tablet Take 1 tablet (10 mg total) by mouth every 8 (eight) hours as needed (Muscle spasm). 08/18/15   Daleen Bo, MD  Fluticasone-Salmeterol (ADVAIR) 250-50 MCG/DOSE AEPB Inhale 1 puff into the lungs 2  (two) times daily as needed (for shortness of breath).    Historical Provider, MD  lisinopril (PRINIVIL,ZESTRIL) 20 MG tablet Take 20 mg by mouth daily.    Historical Provider, MD  oxyCODONE-acetaminophen (PERCOCET) 5-325 MG tablet Take 1 tablet by mouth every 4 (four) hours as needed for severe pain. 08/18/15   Daleen Bo, MD    Family History Family History  Problem Relation Age of Onset  . Stroke Mother     TIA  . Stroke Father   . Hypertension Brother   . Asthma Daughter   . Sleep apnea Son   . Diabetes Maternal Grandmother   . Cancer Paternal Grandmother   . Cancer Brother     lung and thyroid    Social History Social History  Substance Use Topics  . Smoking status: Never Smoker  . Smokeless tobacco: Never Used  . Alcohol use No     Allergies   Buprenorphine hcl; Morphine and related; Penicillins; Vicodin [hydrocodone-acetaminophen]; and Latex   Review of Systems Review of Systems  Constitutional: Positive for chills. Negative for fever.  HENT: Positive for congestion and sore throat.   Respiratory: Positive for cough.   Cardiovascular: Positive for chest pain (secondary to cough).   Physical Exam Updated Vital Signs BP 124/97 (BP Location: Left Arm)   Pulse (!) 57   Temp 97.6 F (36.4 C) (Oral)   Resp 20  Ht 5\' 9"  (1.753 m)   Wt 283 lb (128.4 kg)   SpO2 97%   BMI 41.79 kg/m   Physical Exam  Constitutional: She is oriented to person, place, and time. She appears well-developed and well-nourished. No distress.  HENT:  Head: Normocephalic and atraumatic.  Eyes: EOM are normal.  Neck: Normal range of motion.  Cardiovascular: Normal rate, regular rhythm and normal heart sounds.   Pulmonary/Chest: Effort normal and breath sounds normal.  Abdominal: Soft. She exhibits no distension. There is no tenderness.  Musculoskeletal: Normal range of motion.  Neurological: She is alert and oriented to person, place, and time.  Skin: Skin is warm and dry.    Psychiatric: She has a normal mood and affect. Judgment normal.  Nursing note and vitals reviewed.  ED Treatments / Results  Labs (all labs ordered are listed, but only abnormal results are displayed) Labs Reviewed - No data to display  EKG  EKG Interpretation None       Radiology No results found.  Procedures Procedures (including critical care time)  Medications Ordered in ED Medications - No data to display  DIAGNOSTIC STUDIES:  Oxygen Saturation is 97% on RA, normal by my interpretation.    COORDINATION OF CARE:  9:40 AM Discussed treatment plan with pt at bedside and pt agreed to plan.  Initial Impression / Assessment and Plan / ED Course  I have reviewed the triage vital signs and the nursing notes.  Pertinent labs & imaging results that were available during my care of the patient were reviewed by me and considered in my medical decision making (see chart for details).  Clinical Course     Symptoms are most likely viral in nature, however since symptoms are persisting greater than one week, she will be provided a prescription for Zithromax your she is to start this if she is not improving in the next 1-2 days.  Final Clinical Impressions(s) / ED Diagnoses   Final diagnoses:  None    New Prescriptions New Prescriptions   No medications on file     Veryl Speak, MD 07/03/16 1503

## 2016-07-03 NOTE — ED Triage Notes (Signed)
Complain of cough and congestion. States OTC medications are not working.

## 2016-07-14 ENCOUNTER — Emergency Department (HOSPITAL_COMMUNITY): Payer: PRIVATE HEALTH INSURANCE

## 2016-07-14 ENCOUNTER — Encounter (HOSPITAL_COMMUNITY): Payer: Self-pay | Admitting: Emergency Medicine

## 2016-07-14 ENCOUNTER — Emergency Department (HOSPITAL_COMMUNITY)
Admission: EM | Admit: 2016-07-14 | Discharge: 2016-07-14 | Disposition: A | Discharge status: Home / Self Care | Payer: PRIVATE HEALTH INSURANCE | Attending: Emergency Medicine | Admitting: Emergency Medicine

## 2016-07-14 DIAGNOSIS — Y92009 Unspecified place in unspecified non-institutional (private) residence as the place of occurrence of the external cause: Secondary | ICD-10-CM | POA: Insufficient documentation

## 2016-07-14 DIAGNOSIS — Z91040 Latex allergy status: Secondary | ICD-10-CM | POA: Insufficient documentation

## 2016-07-14 DIAGNOSIS — Z8541 Personal history of malignant neoplasm of cervix uteri: Secondary | ICD-10-CM | POA: Insufficient documentation

## 2016-07-14 DIAGNOSIS — X501XXA Overexertion from prolonged static or awkward postures, initial encounter: Secondary | ICD-10-CM | POA: Insufficient documentation

## 2016-07-14 DIAGNOSIS — J449 Chronic obstructive pulmonary disease, unspecified: Secondary | ICD-10-CM | POA: Insufficient documentation

## 2016-07-14 DIAGNOSIS — Z79899 Other long term (current) drug therapy: Secondary | ICD-10-CM | POA: Insufficient documentation

## 2016-07-14 DIAGNOSIS — J45909 Unspecified asthma, uncomplicated: Secondary | ICD-10-CM | POA: Insufficient documentation

## 2016-07-14 DIAGNOSIS — Y999 Unspecified external cause status: Secondary | ICD-10-CM | POA: Insufficient documentation

## 2016-07-14 DIAGNOSIS — Y939 Activity, unspecified: Secondary | ICD-10-CM | POA: Insufficient documentation

## 2016-07-14 DIAGNOSIS — S93492A Sprain of other ligament of left ankle, initial encounter: Secondary | ICD-10-CM | POA: Insufficient documentation

## 2016-07-14 MED ORDER — DICLOFENAC SODIUM 50 MG PO TBEC: 50.0000 mg | DELAYED_RELEASE_TABLET | Freq: Two times a day (BID) | ORAL | 0 refills | Status: DC

## 2016-07-14 NOTE — ED Triage Notes (Signed)
Pt states she has been having problems with her L knee. Pt reports stepping down today and her L knee giving way. Pt c/o injury to her L ankle.

## 2016-07-14 NOTE — ED Notes (Signed)
ASO not big enough for patients leg. Ace wrap applied

## 2016-07-14 NOTE — ED Provider Notes (Signed)
[facility] DEPT Provider Note   CSN: [id number] Arrival date & time: 07/14/16  1821     History   Chief Complaint Chief Complaint  Patient presents with  . Ankle Pain    HPI Mary [name] is a 35 y.o. female.  The history is provided by the patient. No language interpreter was used.  Ankle Pain   The incident occurred 1 to 2 hours ago. The incident occurred at home. The injury mechanism was torsion. The pain is present in the left ankle. The quality of the pain is described as aching. The pain is moderate. The pain has been constant since onset. She reports no foreign bodies present. Nothing aggravates the symptoms. She has tried nothing for the symptoms. The treatment provided no relief.  Pt reports she turned her ankle.  Pt complains of pain in her foot and ankle  Past Medical History:  Diagnosis Date  . Asthma   . Cancer of cervix ([place])   . COPD (chronic obstructive pulmonary disease) ([place])   . Physiological ovarian cysts   . S/P partial hysterectomy     Patient Active Problem List   Diagnosis Date Noted  . Right ovarian cyst 03/19/2014  . Trichimoniasis 03/19/2014  . DYSPNEA 11/26/2009  . ASTHMA 11/23/2009    Past Surgical History:  Procedure Laterality Date  . ABDOMINAL HYSTERECTOMY    . EYE SURGERY     Artificial right eye  . L ovarian removal Left     OB History    Gravida Para Term Preterm AB Living   3 3 3          SAB TAB Ectopic Multiple Live Births                   Home Medications    Prior to Admission medications   Medication Sig Start Date End Date Taking? Authorizing Provider  albuterol (PROVENTIL HFA;VENTOLIN HFA) 108 (90 BASE) MCG/ACT inhaler Inhale 2 puffs into the lungs as needed for wheezing or shortness of breath.     Historical Provider, MD  azithromycin (ZITHROMAX Z-PAK) 250 MG tablet 2 po day one, then 1 daily x 4 days 07/03/16   Veryl Speak, MD  diazepam (VALIUM) 10 MG tablet Take 1 tablet (10 mg total) by mouth every 8  (eight) hours as needed (Muscle spasm). 08/18/15   Daleen Bo, MD  Fluticasone-Salmeterol (ADVAIR) 250-50 MCG/DOSE AEPB Inhale 1 puff into the lungs 2 (two) times daily as needed (for shortness of breath).    Historical Provider, MD  lisinopril (PRINIVIL,ZESTRIL) 20 MG tablet Take 20 mg by mouth daily.    Historical Provider, MD  oxyCODONE-acetaminophen (PERCOCET) 5-325 MG tablet Take 1 tablet by mouth every 4 (four) hours as needed for severe pain. 08/18/15   Daleen Bo, MD    Family History Family History  Problem Relation Age of Onset  . Stroke Mother     TIA  . Stroke Father   . Hypertension Brother   . Asthma Daughter   . Sleep apnea Son   . Diabetes Maternal Grandmother   . Cancer Paternal Grandmother   . Cancer Brother     lung and thyroid    Social History Social History  Substance Use Topics  . Smoking status: Never Smoker  . Smokeless tobacco: Never Used  . Alcohol use No     Allergies   Buprenorphine hcl; Morphine and related; Penicillins; Vicodin [hydrocodone-acetaminophen]; and Latex   Review of Systems Review of Systems  All other  systems reviewed and are negative.    Physical Exam Updated Vital Signs BP 135/95   Pulse 66   Temp 98.4 F (36.9 C) (Oral)   Resp 16   Ht 5\' 9"  (1.753 m)   Wt 128.4 kg   SpO2 100%   BMI 41.79 kg/m   Physical Exam  Musculoskeletal: She exhibits tenderness. She exhibits no deformity.  Tender left ankle, pain with movement,  nv and ns intact  Neurological: She is alert.  Skin: Skin is warm.  Psychiatric: She has a normal mood and affect.  Nursing note and vitals reviewed.    ED Treatments / Results  Labs (all labs ordered are listed, but only abnormal results are displayed) Labs Reviewed - No data to display  EKG  EKG Interpretation None       Radiology Dg Ankle Complete Left  Result Date: 07/14/2016 CLINICAL DATA:  Twisted ankle with pain EXAM: LEFT ANKLE COMPLETE - 3+ VIEW COMPARISON:  None.  FINDINGS: No fracture or malalignment. Ankle mortise grossly symmetric. Mild soft tissue swelling. Small plantar and posterior calcaneal spurs. IMPRESSION: Soft tissue swelling.  No definite acute osseous abnormality. Electronically Signed   By: Donavan Foil M.D.   On: 07/14/2016 19:41    Procedures Procedures (including critical care time)  Medications Ordered in ED Medications - No data to display   Initial Impression / Assessment and Plan / ED Course  I have reviewed the triage vital signs and the nursing notes.  Pertinent labs & imaging results that were available during my care of the patient were reviewed by me and considered in my medical decision making (see chart for details).  Clinical Course       Final Clinical Impressions(s) / ED Diagnoses   Final diagnoses:  Sprain of other ligament of left ankle, initial encounter    New Prescriptions New Prescriptions   DICLOFENAC (VOLTAREN) 50 MG EC TABLET    Take 1 tablet (50 mg total) by mouth 2 (two) times daily.     Hollace Kinnier [place], PA-C 07/14/16 2018    Sherwood Gambler, MD 07/20/16 [phone]

## 2016-07-14 NOTE — Discharge Instructions (Signed)
See your Physician for recheck in 4-5 days.  Rest, Elevate and apply ice to area of swelling

## 2016-08-30 ENCOUNTER — Telehealth: Payer: Self-pay | Admitting: *Deleted

## 2016-08-30 NOTE — Telephone Encounter (Signed)
Pt states she went ahead and made an appointment to see Manus Gunning on 1/24.

## 2016-09-05 ENCOUNTER — Emergency Department (HOSPITAL_COMMUNITY)
Admission: EM | Admit: 2016-09-05 | Discharge: 2016-09-05 | Disposition: A | Discharge status: Home / Self Care | Payer: PRIVATE HEALTH INSURANCE | Attending: Emergency Medicine | Admitting: Emergency Medicine

## 2016-09-05 ENCOUNTER — Encounter (HOSPITAL_COMMUNITY): Payer: Self-pay | Admitting: Emergency Medicine

## 2016-09-05 DIAGNOSIS — Z79899 Other long term (current) drug therapy: Secondary | ICD-10-CM | POA: Insufficient documentation

## 2016-09-05 DIAGNOSIS — J45909 Unspecified asthma, uncomplicated: Secondary | ICD-10-CM | POA: Insufficient documentation

## 2016-09-05 DIAGNOSIS — Z8541 Personal history of malignant neoplasm of cervix uteri: Secondary | ICD-10-CM | POA: Insufficient documentation

## 2016-09-05 DIAGNOSIS — Z791 Long term (current) use of non-steroidal anti-inflammatories (NSAID): Secondary | ICD-10-CM | POA: Insufficient documentation

## 2016-09-05 DIAGNOSIS — J449 Chronic obstructive pulmonary disease, unspecified: Secondary | ICD-10-CM | POA: Insufficient documentation

## 2016-09-05 DIAGNOSIS — L259 Unspecified contact dermatitis, unspecified cause: Secondary | ICD-10-CM | POA: Insufficient documentation

## 2016-09-05 MED ORDER — NAPROXEN 250 MG PO TABS
500.0000 mg | ORAL_TABLET | Freq: Once | ORAL | Status: AC
  Administered 2016-09-05: 500 mg via ORAL
  Filled 2016-09-05: qty 2

## 2016-09-05 MED ORDER — HYDROXYZINE HCL 25 MG PO TABS: 25.0000 mg | ORAL_TABLET | Freq: Four times a day (QID) | ORAL | 0 refills | Status: DC

## 2016-09-05 MED ORDER — PREDNISONE 10 MG PO TABS
ORAL_TABLET | ORAL | 0 refills | Status: DC
Start: 2016-09-05 — End: 2016-09-13

## 2016-09-05 MED ORDER — DEXAMETHASONE SODIUM PHOSPHATE 4 MG/ML IJ SOLN
10.0000 mg | Freq: Once | INTRAMUSCULAR | Status: AC
  Administered 2016-09-05: 10 mg via INTRAMUSCULAR
  Filled 2016-09-05: qty 3

## 2016-09-05 NOTE — ED Triage Notes (Signed)
Pt reports rash on abdomen, back and legs for 2 weeks with itching.

## 2016-09-05 NOTE — ED Provider Notes (Signed)
[facility] DEPT Provider Note   CSN: [id number] Arrival date & time: 09/05/16  0831   By signing my name below, I, Collene Leyden, attest that this documentation has been prepared under the direction and in the presence of Chyanne Kohut PA-C Electronically Signed: Collene Leyden, Scribe. 09/05/16. 9:09 AM.   History   Chief Complaint Chief Complaint  Patient presents with  . Rash    HPI Comments: Mary [name] is a 36 y.o. female who presents to the Emergency Department complaining of a gradually worsening rash that appeared 2 weeks ago. Patient states the rash began on her hands and now spread to the abdomen, legs, back.  She describes associated burning, and itching. Patient states taking a shower makes the pain and itching better. Patient reports taking Advil, benadryl and using cortisone cream with no improvement. She denies any new product use, difficulty breathing, difficulty swallowing, numbness, tingling, or sore throat.    The history is provided by the patient. No language interpreter was used.    Past Medical History:  Diagnosis Date  . Asthma   . Cancer of cervix ([place])   . COPD (chronic obstructive pulmonary disease) ([place])   . Physiological ovarian cysts   . S/P partial hysterectomy     Patient Active Problem List   Diagnosis Date Noted  . Right ovarian cyst 03/19/2014  . Trichimoniasis 03/19/2014  . DYSPNEA 11/26/2009  . ASTHMA 11/23/2009    Past Surgical History:  Procedure Laterality Date  . ABDOMINAL HYSTERECTOMY    . EYE SURGERY     Artificial right eye  . L ovarian removal Left     OB History    Gravida Para Term Preterm AB Living   3 3 3          SAB TAB Ectopic Multiple Live Births                   Home Medications    Prior to Admission medications   Medication Sig Start Date End Date Taking? Authorizing Provider  albuterol (PROVENTIL HFA;VENTOLIN HFA) 108 (90 BASE) MCG/ACT inhaler Inhale 2 puffs into the lungs as needed for  wheezing or shortness of breath.     Historical Provider, MD  azithromycin (ZITHROMAX Z-PAK) 250 MG tablet 2 po day one, then 1 daily x 4 days 07/03/16   Veryl Speak, MD  diazepam (VALIUM) 10 MG tablet Take 1 tablet (10 mg total) by mouth every 8 (eight) hours as needed (Muscle spasm). 08/18/15   Daleen Bo, MD  diclofenac (VOLTAREN) 50 MG EC tablet Take 1 tablet (50 mg total) by mouth 2 (two) times daily. 07/14/16   Fransico Meadow, PA-C  Fluticasone-Salmeterol (ADVAIR) 250-50 MCG/DOSE AEPB Inhale 1 puff into the lungs 2 (two) times daily as needed (for shortness of breath).    Historical Provider, MD  lisinopril (PRINIVIL,ZESTRIL) 20 MG tablet Take 20 mg by mouth daily.    Historical Provider, MD  oxyCODONE-acetaminophen (PERCOCET) 5-325 MG tablet Take 1 tablet by mouth every 4 (four) hours as needed for severe pain. 08/18/15   Daleen Bo, MD    Family History Family History  Problem Relation Age of Onset  . Stroke Mother     TIA  . Stroke Father   . Hypertension Brother   . Asthma Daughter   . Sleep apnea Son   . Diabetes Maternal Grandmother   . Cancer Paternal Grandmother   . Cancer Brother     lung and thyroid    Social  History Social History  Substance Use Topics  . Smoking status: Never Smoker  . Smokeless tobacco: Never Used  . Alcohol use No     Allergies   Buprenorphine hcl; Morphine and related; Penicillins; Vicodin [hydrocodone-acetaminophen]; and Latex   Review of Systems Review of Systems  Constitutional: Negative for activity change, appetite change, chills and fever.  HENT: Negative for facial swelling, sore throat and trouble swallowing.   Respiratory: Negative for chest tightness, shortness of breath and wheezing.   Cardiovascular: Negative for chest pain.  Musculoskeletal: Negative for neck pain and neck stiffness.  Skin: Positive for rash (abdomen, legs, and back ). Negative for wound.  Neurological: Negative for dizziness, weakness, numbness  and headaches.  All other systems reviewed and are negative.    Physical Exam Updated Vital Signs BP 143/80 (BP Location: Right Arm)   Pulse 63   Temp 97.7 F (36.5 C) (Oral)   Resp 18   Ht 5\' 9"  (1.753 m)   Wt 210 lb (95.3 kg)   SpO2 99%   BMI 31.01 kg/m   Physical Exam  Constitutional: She is oriented to person, place, and time. She appears well-developed. No distress.  HENT:  Head: Normocephalic and atraumatic.  Mouth/Throat: Oropharynx is clear and moist.  Eyes: Conjunctivae and EOM are normal. Pupils are equal, round, and reactive to light.  Neck: Normal range of motion. Neck supple.  Cardiovascular: Normal rate, regular rhythm and intact distal pulses.   Pulmonary/Chest: Effort normal and breath sounds normal.  Abdominal: Soft. Bowel sounds are normal.  Musculoskeletal: Normal range of motion.  Neurological: She is alert and oriented to person, place, and time.  Skin: Skin is warm and dry. Rash noted.  erythematous macular papular rash to the lower torso and bilateral upper and lower extremities. No edema.      ED Treatments / Results  DIAGNOSTIC STUDIES: Oxygen Saturation is 99% on RA, normal by my interpretation.    COORDINATION OF CARE: 9:02 AM Discussed treatment plan with pt at bedside and pt agreed to plan.  Labs (all labs ordered are listed, but only abnormal results are displayed) Labs Reviewed - No data to display  EKG  EKG Interpretation None       Radiology No results found.  Procedures Procedures (including critical care time)  Medications Ordered in ED Medications  dexamethasone (DECADRON) injection 10 mg (10 mg Intramuscular Given 09/05/16 0938)  naproxen (NAPROSYN) tablet 500 mg (500 mg Oral Given 09/05/16 [id number])     Initial Impression / Assessment and Plan / ED Course  I have reviewed the triage vital signs and the nursing notes.  Pertinent labs & imaging results that were available during my care of the patient were reviewed by  me and considered in my medical decision making (see chart for details).  Clinical Course     Pt is well appearing, vitals stable.  No edema, no airway compromise.  Rash appears c/w contact dermatitis.  Pt agrees to close PMD f/u, Rx for vistaril, prednisone.    The patient appears reasonably screened and/or stabilized for discharge and I doubt any other medical condition or other [facility] requiring further screening, evaluation, or treatment in the ED at this time prior to discharge.   Final Clinical Impressions(s) / ED Diagnoses   Final diagnoses:  Contact dermatitis, unspecified contact dermatitis type, unspecified trigger    New Prescriptions New Prescriptions   No medications on file   I personally performed the services described in this documentation, which was  scribed in my presence. The recorded information has been reviewed and is accurate.     Kem Parkinson, PA-C 09/05/16 [place], MD 09/05/16 1537

## 2016-09-05 NOTE — Discharge Instructions (Signed)
Follow-up with your doctor for recheck if needed °

## 2016-09-13 ENCOUNTER — Other Ambulatory Visit (HOSPITAL_COMMUNITY)
Admission: RE | Admit: 2016-09-13 | Discharge: 2016-09-13 | Disposition: A | Discharge status: Home / Self Care | Payer: Medicaid Other | Source: Ambulatory Visit | Attending: Advanced Practice Midwife | Admitting: Advanced Practice Midwife

## 2016-09-13 ENCOUNTER — Encounter: Payer: Self-pay | Admitting: Advanced Practice Midwife

## 2016-09-13 ENCOUNTER — Ambulatory Visit (INDEPENDENT_AMBULATORY_CARE_PROVIDER_SITE_OTHER): Payer: Medicaid Other | Admitting: Advanced Practice Midwife

## 2016-09-13 VITALS — BP 120/90 | HR 74 | Ht 69.0 in | Wt 278.0 lb

## 2016-09-13 DIAGNOSIS — Z01419 Encounter for gynecological examination (general) (routine) without abnormal findings: Secondary | ICD-10-CM | POA: Insufficient documentation

## 2016-09-13 DIAGNOSIS — Z1151 Encounter for screening for human papillomavirus (HPV): Secondary | ICD-10-CM | POA: Insufficient documentation

## 2016-09-13 DIAGNOSIS — Z113 Encounter for screening for infections with a predominantly sexual mode of transmission: Secondary | ICD-10-CM | POA: Diagnosis present

## 2016-09-13 DIAGNOSIS — C539 Malignant neoplasm of cervix uteri, unspecified: Secondary | ICD-10-CM | POA: Insufficient documentation

## 2016-09-13 DIAGNOSIS — Z308 Encounter for other contraceptive management: Secondary | ICD-10-CM

## 2016-09-13 MED ORDER — METRONIDAZOLE 500 MG PO TABS: 500.0000 mg | ORAL_TABLET | Freq: Two times a day (BID) | ORAL | 0 refills | Status: DC

## 2016-09-13 NOTE — Progress Notes (Signed)
Mary [name] 36 y.o.  Vitals:   09/13/16 1415  BP: 120/90  Pulse: 74     Filed Weights   09/13/16 1415  Weight: 278 lb (126.1 kg)    Past Medical History: Past Medical History:  Diagnosis Date  . Asthma   . Cancer of cervix ([place])   . COPD (chronic obstructive pulmonary disease) ([place])   . Physiological ovarian cysts   . S/P partial hysterectomy     Past Surgical History: Past Surgical History:  Procedure Laterality Date  . ABDOMINAL HYSTERECTOMY    . EYE SURGERY     Artificial right eye  . L ovarian removal Left     Family History: Family History  Problem Relation Age of Onset  . Stroke Mother     TIA  . Stroke Father   . Hypertension Brother   . Asthma Daughter   . Sleep apnea Son   . Diabetes Maternal Grandmother   . Cancer Paternal Grandmother   . Cancer Brother     lung and thyroid    Social History: Social History  Substance Use Topics  . Smoking status: Never Smoker  . Smokeless tobacco: Never Used  . Alcohol use No    Allergies:  Allergies  Allergen Reactions  . Buprenorphine Hcl Anaphylaxis    "it stops my heart"  . Morphine Anaphylaxis    i quit breathing   . Morphine And Related Anaphylaxis    "it stops my heart"  . Penicillins Anaphylaxis and Other (See Comments)    I quit breathing  "it stops my heart"  . Gabapentin Other (See Comments)    Per patient "hallucination"  . Latex Rash      Current Outpatient Prescriptions:  .  ALPRAZolam (XANAX) 0.5 MG tablet, Take 0.5 mg by mouth 4 (four) times daily., Disp: , Rfl:  .  diazepam (VALIUM) 10 MG tablet, Take 1 tablet (10 mg total) by mouth every 8 (eight) hours as needed (Muscle spasm)., Disp: 15 tablet, Rfl: 0 .  hydrOXYzine (ATARAX/VISTARIL) 25 MG tablet, Take 1 tablet (25 mg total) by mouth every 6 (six) hours. Prn itching, Disp: 12 tablet, Rfl: 0 .  lisinopril (PRINIVIL,ZESTRIL) 20 MG tablet, Take 20 mg by mouth daily., Disp: , Rfl:  .  Oxycodone HCl 10 MG TABS, Take 10 mg  by mouth every 4 (four) hours., Disp: , Rfl:   History of Present Illness: here for pap.  Had Hyst 2012 d/t "cervical cancer".  Records not available, but pt was referred to [facility] so it sounds like she still needs paps yearly. Says sex hurts. But then she says she hasn't had sex in 3 years.  C/O intermittent fould vaginal smell.  Still has pain at CS site. Working on [employer] pl[employer], has FP only.  Going to [facility] tomorrow to get back on anxiety meds (has been off 8 years).  Plans on getting paperwork done for [facility] discount so she can see a PCP about BP, etc.   Review of Systems   Patient denies any  blurred vision, shortness of breath, chest pain, abdominal pain, problems with bowel movements, urination,  Physical Exam: General:  Well developed, well nourished, no acute distress Skin:  Warm and dry.  Hives on both flanks Neck:  Midline trachea, normal thyroid Lungs; Clear to auscultation bilaterally Breast:  No dominant palpable mass, retraction, or nipple discharge Cardiovascular: Regular rate and rhythm. ? Heart murmer Abdomen:  Soft, non tender, no hepatosplenomegaly Pelvic:  External genitalia  is normal in appearance.  The vagina is normal in appearance.   No adnexal masses or tenderness noted. Exam limited by habitus.  Tender superficially over CS scar Extremities:  No swelling or varicosities noted Psych: feels depressed/anxious  Impression: normal gyn exam     Plan: paps q year Applying for financial assistance.  Encouraged to seek care at [facility] and get back in w/a PCP

## 2016-09-18 LAB — CYTOLOGY - PAP
Adequacy: ABSENT
Chlamydia: NEGATIVE
Diagnosis: NEGATIVE
HPV: NOT DETECTED
Neisseria Gonorrhea: NEGATIVE

## 2017-01-01 ENCOUNTER — Emergency Department (HOSPITAL_COMMUNITY)
Admission: EM | Admit: 2017-01-01 | Discharge: 2017-01-01 | Disposition: A | Discharge status: Home / Self Care | Payer: No Typology Code available for payment source | Attending: Emergency Medicine | Admitting: Emergency Medicine

## 2017-01-01 ENCOUNTER — Encounter (HOSPITAL_COMMUNITY): Payer: Self-pay | Admitting: Emergency Medicine

## 2017-01-01 ENCOUNTER — Emergency Department (HOSPITAL_COMMUNITY): Payer: No Typology Code available for payment source

## 2017-01-01 DIAGNOSIS — J45909 Unspecified asthma, uncomplicated: Secondary | ICD-10-CM | POA: Diagnosis not present

## 2017-01-01 DIAGNOSIS — Y999 Unspecified external cause status: Secondary | ICD-10-CM | POA: Insufficient documentation

## 2017-01-01 DIAGNOSIS — S3992XA Unspecified injury of lower back, initial encounter: Secondary | ICD-10-CM | POA: Diagnosis present

## 2017-01-01 DIAGNOSIS — Z79899 Other long term (current) drug therapy: Secondary | ICD-10-CM | POA: Insufficient documentation

## 2017-01-01 DIAGNOSIS — Y939 Activity, unspecified: Secondary | ICD-10-CM | POA: Diagnosis not present

## 2017-01-01 DIAGNOSIS — J449 Chronic obstructive pulmonary disease, unspecified: Secondary | ICD-10-CM | POA: Insufficient documentation

## 2017-01-01 DIAGNOSIS — Y92410 Unspecified street and highway as the place of occurrence of the external cause: Secondary | ICD-10-CM | POA: Diagnosis not present

## 2017-01-01 DIAGNOSIS — S39012A Strain of muscle, fascia and tendon of lower back, initial encounter: Secondary | ICD-10-CM

## 2017-01-01 DIAGNOSIS — Z8541 Personal history of malignant neoplasm of cervix uteri: Secondary | ICD-10-CM | POA: Diagnosis not present

## 2017-01-01 HISTORY — DX: Malignant neoplasm of cervix uteri, unspecified: C53.9

## 2017-01-01 MED ORDER — METHOCARBAMOL 500 MG PO TABS
500.0000 mg | ORAL_TABLET | Freq: Once | ORAL | Status: AC
  Administered 2017-01-01: 500 mg via ORAL
  Filled 2017-01-01: qty 1

## 2017-01-01 MED ORDER — NAPROXEN 500 MG PO TABS: 500.0000 mg | ORAL_TABLET | Freq: Two times a day (BID) | ORAL | 0 refills | Status: DC

## 2017-01-01 MED ORDER — METHOCARBAMOL 500 MG PO TABS: 500.0000 mg | ORAL_TABLET | Freq: Three times a day (TID) | ORAL | 0 refills | Status: DC
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
## 2017-01-01 NOTE — Discharge Instructions (Signed)
Apply ice packs on/off to your back.  Follow-up with your orthopedic doctor tomorrow.
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
## 2017-01-08 DIAGNOSIS — C539 Malignant neoplasm of cervix uteri, unspecified: Secondary | ICD-10-CM | POA: Insufficient documentation

## 2017-05-02 ENCOUNTER — Encounter (HOSPITAL_COMMUNITY): Payer: Self-pay | Admitting: Emergency Medicine

## 2017-05-02 ENCOUNTER — Emergency Department (HOSPITAL_COMMUNITY)
Admission: EM | Admit: 2017-05-02 | Discharge: 2017-05-03 | Disposition: A | Discharge status: Home / Self Care | Payer: PRIVATE HEALTH INSURANCE | Attending: Emergency Medicine | Admitting: Emergency Medicine

## 2017-05-02 DIAGNOSIS — R55 Syncope and collapse: Secondary | ICD-10-CM | POA: Insufficient documentation

## 2017-05-02 DIAGNOSIS — Z79899 Other long term (current) drug therapy: Secondary | ICD-10-CM | POA: Insufficient documentation

## 2017-05-02 DIAGNOSIS — Z91040 Latex allergy status: Secondary | ICD-10-CM | POA: Insufficient documentation

## 2017-05-02 DIAGNOSIS — R51 Headache: Secondary | ICD-10-CM | POA: Insufficient documentation

## 2017-05-02 DIAGNOSIS — J449 Chronic obstructive pulmonary disease, unspecified: Secondary | ICD-10-CM | POA: Insufficient documentation

## 2017-05-02 DIAGNOSIS — R519 Headache, unspecified: Secondary | ICD-10-CM

## 2017-05-02 DIAGNOSIS — J45909 Unspecified asthma, uncomplicated: Secondary | ICD-10-CM | POA: Insufficient documentation

## 2017-05-02 MED ORDER — KETOROLAC TROMETHAMINE 30 MG/ML IJ SOLN
15.0000 mg | Freq: Once | INTRAMUSCULAR | Status: AC
  Administered 2017-05-02: 15 mg via INTRAVENOUS
  Filled 2017-05-02: qty 1

## 2017-05-02 MED ORDER — DIPHENHYDRAMINE HCL 50 MG/ML IJ SOLN
25.0000 mg | Freq: Once | INTRAMUSCULAR | Status: AC
  Administered 2017-05-02: 25 mg via INTRAVENOUS
  Filled 2017-05-02: qty 1

## 2017-05-02 MED ORDER — DEXAMETHASONE SODIUM PHOSPHATE 4 MG/ML IJ SOLN
10.0000 mg | Freq: Once | INTRAMUSCULAR | Status: AC
  Administered 2017-05-02: 10 mg via INTRAVENOUS
  Filled 2017-05-02: qty 3

## 2017-05-02 MED ORDER — SODIUM CHLORIDE 0.9 % IV BOLUS (SEPSIS)
500.0000 mL | Freq: Once | INTRAVENOUS | Status: AC
  Administered 2017-05-02: 500 mL via INTRAVENOUS

## 2017-05-02 MED ORDER — METOCLOPRAMIDE HCL 5 MG/ML IJ SOLN
10.0000 mg | Freq: Once | INTRAMUSCULAR | Status: AC
  Administered 2017-05-02: 10 mg via INTRAVENOUS
  Filled 2017-05-02: qty 2

## 2017-05-02 NOTE — ED Provider Notes (Signed)
[facility] DEPT Provider Note   CSN: [id number] Arrival date & time: 05/02/17  1938     History   Chief Complaint Chief Complaint  Patient presents with  . Headache    HPI Mary [name] is a 36 y.o. female who presents with a headache, syncope, and a sore throat. PMH significant for migraines, chronic pain, hypertension, obesity. She states that she has a gradually worsening headache for the past 2-3 weeks. It is likely prior migraines before but "worse". It goes from the left posterior side of her head and wraps around to the front. She has a "knot" on the left side of her scalp and questions whether or not this is why she gets headaches. She has been under a lot of stress and has had a death in the family and her father had a major trauma. Today she was watching her niece and was lightheaded so she went to sit down on the couch. She had a syncopal episode and woke up with her family around her so EMS was called. She has not had a syncopal episode before. She reports constant nausea without vomiting. Sitting in a dark room makes her symptoms better. She states she has not eaten since this morning.   HPI  Past Medical History:  Diagnosis Date  . Asthma   . Cancer of cervix ([place])   . Cervical cancer ([place])   . COPD (chronic obstructive pulmonary disease) ([place])   . Physiological ovarian cysts   . S/P partial hysterectomy     Patient Active Problem List   Diagnosis Date Noted  . Cancer of cervix ([place])   . Right ovarian cyst 03/19/2014  . Trichimoniasis 03/19/2014  . DYSPNEA 11/26/2009  . ASTHMA 11/23/2009    Past Surgical History:  Procedure Laterality Date  . ABDOMINAL HYSTERECTOMY    . EYE SURGERY     Artificial right eye  . L ovarian removal Left     OB History    Gravida Para Term Preterm AB Living   3 3 3          SAB TAB Ectopic Multiple Live Births                   Home Medications    Prior to Admission medications   Medication Sig Start Date End  Date Taking? Authorizing Provider  ALPRAZolam Duanne Moron) 0.5 MG tablet Take 0.5 mg by mouth 4 (four) times daily.    [provider]  diazepam (VALIUM) 10 MG tablet Take 1 tablet (10 mg total) by mouth every 8 (eight) hours as needed (Muscle spasm). 08/18/15   Daleen Bo, MD  hydrOXYzine (ATARAX/VISTARIL) 25 MG tablet Take 1 tablet (25 mg total) by mouth every 6 (six) hours. Prn itching 09/05/16   Triplett, Tammy, PA-C  lisinopril (PRINIVIL,ZESTRIL) 20 MG tablet Take 20 mg by mouth daily.    [provider]  methocarbamol (ROBAXIN) 500 MG tablet Take 1 tablet (500 mg total) by mouth 3 (three) times daily. 01/01/17   Triplett, Tammy, PA-C  metroNIDAZOLE (FLAGYL) 500 MG tablet Take 1 tablet (500 mg total) by mouth 2 (two) times daily. 09/13/16   Cresenzo-Dishmon, Joaquim Lai, CNM  naproxen (NAPROSYN) 500 MG tablet Take 1 tablet (500 mg total) by mouth 2 (two) times daily with a meal. 01/01/17   Triplett, Tammy, PA-C  Oxycodone HCl 10 MG TABS Take 10 mg by mouth every 4 (four) hours.    [provider]    Family History Family History  Problem Relation Age of Onset  . Stroke Mother        TIA  . Stroke Father   . Hypertension Brother   . Asthma Daughter   . Sleep apnea Son   . Diabetes Maternal Grandmother   . Cancer Paternal Grandmother   . Cancer Brother        lung and thyroid    Social History Social History  Substance Use Topics  . Smoking status: Never Smoker  . Smokeless tobacco: Never Used  . Alcohol use No     Allergies   Buprenorphine hcl; Morphine; Morphine and related; Penicillins; Gabapentin; and Latex   Review of Systems Review of Systems  Constitutional: Negative for appetite change, chills and fever.  Eyes: Positive for visual disturbance.  Gastrointestinal: Positive for nausea. Negative for abdominal pain and vomiting.  Musculoskeletal: Positive for back pain (chronic).  Neurological: Positive for syncope, light-headedness and headaches.  Negative for dizziness, weakness and numbness.  All other systems reviewed and are negative.   Physical Exam Updated Vital Signs BP (!) 114/57 (BP Location: Right Arm)   Pulse 65   Temp 98.6 F (37 C) (Oral)   Resp 18   Ht 5\' 9"  (1.753 m)   Wt 111.1 kg (245 lb)   SpO2 98%   BMI 36.18 kg/m   Physical Exam  Constitutional: She is oriented to person, place, and time. She appears well-developed and well-nourished. No distress.  Asking for her [employer] meal  HENT:  Head: Normocephalic and atraumatic.  Right Ear: Hearing, tympanic membrane, external ear and ear canal normal.  Left Ear: Hearing, tympanic membrane and ear canal normal.  1cm nodule on left lateral side of head. Likely a cyst  Eyes: Pupils are equal, round, and reactive to light. Conjunctivae are normal. Right eye exhibits no discharge. Left eye exhibits no discharge. No scleral icterus.  Prosthetic right eye  Neck: Normal range of motion.  Cardiovascular: Normal rate and regular rhythm.  Exam reveals no gallop and no friction rub.   No murmur heard. Pulmonary/Chest: Effort normal and breath sounds normal. No respiratory distress. She has no wheezes. She has no rales. She exhibits no tenderness.  Abdominal: She exhibits no distension.  Neurological: She is alert and oriented to person, place, and time.  Lying on stretcher in NAD. GCS 15. Speaks in a clear voice. Cranial nerves II through XII grossly intact. 5/5 strength in all extremities. Sensation fully intact.  Bilateral finger-to-nose intact. Ambulatory   Skin: Skin is warm and dry.  Psychiatric: She has a normal mood and affect. Her behavior is normal.  Nursing note and vitals reviewed.    ED Treatments / Results  Labs (all labs ordered are listed, but only abnormal results are displayed) Labs Reviewed - No data to display  EKG  EKG Interpretation None       Radiology No results found.  Procedures Procedures (including critical care  time)  Medications Ordered in ED Medications  sodium chloride 0.9 % bolus 500 mL (0 mLs Intravenous Stopped 05/03/17 0104)  ketorolac (TORADOL) 30 MG/ML injection 15 mg (15 mg Intravenous Given 05/02/17 2247)  metoCLOPramide (REGLAN) injection 10 mg (10 mg Intravenous Given 05/02/17 2250)  diphenhydrAMINE (BENADRYL) injection 25 mg (25 mg Intravenous Given 05/02/17 2249)  dexamethasone (DECADRON) injection 10 mg (10 mg Intravenous Given 05/02/17 2245)     Initial Impression / Assessment and Plan / ED Course  I have reviewed the triage vital signs and the nursing notes.  Pertinent labs &  imaging results that were available during my care of the patient were reviewed by me and considered in my medical decision making (see chart for details).  36 year old with headache and syncope. Vitals are normal. Neuro exam is unremarkable. Will defer any imaging at this time especially since she has chronic headaches and it has been persistent for 2-3 weeks. CBG is normal. EKG is normal. Suspect her syncopal episode was due to not eating all day. Migraine cocktail and fluids were given and she feels much better. Will d/c with return precautions.  Final Clinical Impressions(s) / ED Diagnoses   Final diagnoses:  Bad headache  Syncope, unspecified syncope type    New Prescriptions New Prescriptions   No medications on file     Iris Pert 05/03/17 Ethelle Lyon, MD 05/05/17 [phone]

## 2017-05-02 NOTE — ED Triage Notes (Signed)
Pt c/o headache to the left side of head x 2 days. Pt states this pain is different than normal headaches she has. Pt states she is under a lot stress.

## 2017-05-23 ENCOUNTER — Ambulatory Visit: Payer: Self-pay | Admitting: Advanced Practice Midwife

## 2017-05-23 ENCOUNTER — Encounter: Payer: Self-pay | Admitting: Obstetrics & Gynecology

## 2017-06-01 ENCOUNTER — Emergency Department (HOSPITAL_BASED_OUTPATIENT_CLINIC_OR_DEPARTMENT_OTHER)
Admission: EM | Admit: 2017-06-01 | Discharge: 2017-06-02 | Disposition: A | Discharge status: Home / Self Care | Payer: No Typology Code available for payment source | Attending: Emergency Medicine | Admitting: Emergency Medicine

## 2017-06-01 ENCOUNTER — Encounter (HOSPITAL_BASED_OUTPATIENT_CLINIC_OR_DEPARTMENT_OTHER): Payer: Self-pay | Admitting: *Deleted

## 2017-06-01 DIAGNOSIS — Z79899 Other long term (current) drug therapy: Secondary | ICD-10-CM | POA: Diagnosis not present

## 2017-06-01 DIAGNOSIS — Z8541 Personal history of malignant neoplasm of cervix uteri: Secondary | ICD-10-CM | POA: Diagnosis not present

## 2017-06-01 DIAGNOSIS — Z91040 Latex allergy status: Secondary | ICD-10-CM | POA: Diagnosis not present

## 2017-06-01 DIAGNOSIS — Y998 Other external cause status: Secondary | ICD-10-CM | POA: Diagnosis not present

## 2017-06-01 DIAGNOSIS — F419 Anxiety disorder, unspecified: Secondary | ICD-10-CM | POA: Insufficient documentation

## 2017-06-01 DIAGNOSIS — Y92410 Unspecified street and highway as the place of occurrence of the external cause: Secondary | ICD-10-CM | POA: Insufficient documentation

## 2017-06-01 DIAGNOSIS — Y9389 Activity, other specified: Secondary | ICD-10-CM | POA: Diagnosis not present

## 2017-06-01 DIAGNOSIS — J449 Chronic obstructive pulmonary disease, unspecified: Secondary | ICD-10-CM | POA: Insufficient documentation

## 2017-06-01 DIAGNOSIS — S39012A Strain of muscle, fascia and tendon of lower back, initial encounter: Secondary | ICD-10-CM | POA: Insufficient documentation

## 2017-06-01 DIAGNOSIS — F329 Major depressive disorder, single episode, unspecified: Secondary | ICD-10-CM | POA: Diagnosis not present

## 2017-06-01 DIAGNOSIS — S299XXA Unspecified injury of thorax, initial encounter: Secondary | ICD-10-CM | POA: Diagnosis present

## 2017-06-01 DIAGNOSIS — J45909 Unspecified asthma, uncomplicated: Secondary | ICD-10-CM | POA: Insufficient documentation

## 2017-06-01 HISTORY — DX: Dorsalgia, unspecified: M54.9

## 2017-06-01 HISTORY — DX: Other chronic pain: G89.29

## 2017-06-01 HISTORY — DX: Anxiety disorder, unspecified: F41.9

## 2017-06-01 HISTORY — DX: Major depressive disorder, single episode, unspecified: F32.9

## 2017-06-01 HISTORY — DX: Depression, unspecified: F32.A
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
## 2017-06-02 ENCOUNTER — Emergency Department (HOSPITAL_BASED_OUTPATIENT_CLINIC_OR_DEPARTMENT_OTHER): Payer: No Typology Code available for payment source

## 2017-06-02 MED ORDER — OXYCODONE-ACETAMINOPHEN 5-325 MG PO TABS
1.0000 | ORAL_TABLET | Freq: Once | ORAL | Status: AC
  Administered 2017-06-02: 1 via ORAL
  Filled 2017-06-02: qty 1
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
## 2017-10-08 ENCOUNTER — Other Ambulatory Visit: Payer: Self-pay

## 2017-10-08 ENCOUNTER — Emergency Department (HOSPITAL_COMMUNITY)
Admission: EM | Admit: 2017-10-08 | Discharge: 2017-10-08 | Disposition: A | Discharge status: Home / Self Care | Payer: Self-pay | Attending: Emergency Medicine | Admitting: Emergency Medicine

## 2017-10-08 ENCOUNTER — Encounter (HOSPITAL_COMMUNITY): Payer: Self-pay | Admitting: Emergency Medicine

## 2017-10-08 ENCOUNTER — Emergency Department (HOSPITAL_COMMUNITY): Payer: Self-pay

## 2017-10-08 DIAGNOSIS — Z91040 Latex allergy status: Secondary | ICD-10-CM | POA: Insufficient documentation

## 2017-10-08 DIAGNOSIS — Z79899 Other long term (current) drug therapy: Secondary | ICD-10-CM | POA: Insufficient documentation

## 2017-10-08 DIAGNOSIS — R05 Cough: Secondary | ICD-10-CM | POA: Insufficient documentation

## 2017-10-08 DIAGNOSIS — J449 Chronic obstructive pulmonary disease, unspecified: Secondary | ICD-10-CM | POA: Insufficient documentation

## 2017-10-08 DIAGNOSIS — R059 Cough, unspecified: Secondary | ICD-10-CM

## 2017-10-08 LAB — BASIC METABOLIC PANEL
Anion gap: 10 (ref 5–15)
BUN: 8 mg/dL (ref 6–20)
CO2: 29 mmol/L (ref 22–32)
Calcium: 9.6 mg/dL (ref 8.9–10.3)
Chloride: 106 mmol/L (ref 101–111)
Creatinine, Ser: 0.9 mg/dL (ref 0.44–1.00)
GFR calc Af Amer: >60 mL/min (ref >60)
GFR calc non Af Amer: >60 mL/min (ref >60)
Glucose, Bld: 84 mg/dL (ref 65–99)
Potassium: 3.7 mmol/L (ref 3.5–5.1)
Sodium: 145 mmol/L (ref 135–145)

## 2017-10-08 LAB — CBC WITH DIFFERENTIAL/PLATELET
Basophils Absolute: 0 10*3/uL (ref 0.0–0.1)
Basophils Relative: 0 %
Eosinophils Absolute: 0.2 10*3/uL (ref 0.0–0.7)
Eosinophils Relative: 3 %
HCT: 37.8 % (ref 36.0–46.0)
Hemoglobin: 12.4 g/dL (ref 12.0–15.0)
Lymphocytes Relative: 24 %
Lymphs Abs: 1.5 10*3/uL (ref 0.7–4.0)
MCH: 29 pg (ref 26.0–34.0)
MCHC: 32.8 g/dL (ref 30.0–36.0)
MCV: 88.3 fL (ref 78.0–100.0)
Monocytes Absolute: 0.3 10*3/uL (ref 0.1–1.0)
Monocytes Relative: 5 %
Neutro Abs: 4.1 10*3/uL (ref 1.7–7.7)
Neutrophils Relative %: 68 %
Platelets: 184 10*3/uL (ref 150–400)
RBC: 4.28 MIL/uL (ref 3.87–5.11)
RDW: 13.1 % (ref 11.5–15.5)
WBC: 6.1 10*3/uL (ref 4.0–10.5)

## 2017-10-08 LAB — TROPONIN I: Troponin I: <0.03 ng/mL (ref <0.03)

## 2017-10-08 LAB — D-DIMER, QUANTITATIVE: D-Dimer, Quant: 0.36 ug/mL-FEU (ref 0.00–0.50)

## 2017-10-08 MED ORDER — PROMETHAZINE-CODEINE 6.25-10 MG/5ML PO SYRP: 5.0000 mL | ORAL_SOLUTION | ORAL | 0 refills | Status: DC | PRN

## 2017-10-08 MED ORDER — BENZONATATE 100 MG PO CAPS: 200.0000 mg | ORAL_CAPSULE | Freq: Three times a day (TID) | ORAL | 0 refills | Status: DC | PRN

## 2017-10-08 MED ORDER — BENZONATATE 100 MG PO CAPS
200.0000 mg | ORAL_CAPSULE | Freq: Once | ORAL | Status: AC
  Administered 2017-10-08: 200 mg via ORAL
  Filled 2017-10-08: qty 2

## 2017-10-08 NOTE — Discharge Instructions (Signed)
Try the medicines prescribed.  In addition, as discussed,  I recommend trying an antacid, either pepcid or nexium to help rule out your acid reflux as contributing to your cough.
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
## 2017-10-08 NOTE — ED Triage Notes (Signed)
Cough and sorethroat since beginning of month. Pt states she was treated for strep throat beginning of month and has had residual cough and prescription for cough meds not helping. Unable to lay down at without discomfort. Pt states chest hurts when coughing.

## 2018-01-21 ENCOUNTER — Other Ambulatory Visit: Payer: Self-pay

## 2018-01-21 DIAGNOSIS — J45909 Unspecified asthma, uncomplicated: Secondary | ICD-10-CM | POA: Insufficient documentation

## 2018-01-21 DIAGNOSIS — J449 Chronic obstructive pulmonary disease, unspecified: Secondary | ICD-10-CM | POA: Insufficient documentation

## 2018-01-21 DIAGNOSIS — Z91040 Latex allergy status: Secondary | ICD-10-CM | POA: Insufficient documentation

## 2018-01-21 DIAGNOSIS — Z8541 Personal history of malignant neoplasm of cervix uteri: Secondary | ICD-10-CM | POA: Insufficient documentation

## 2018-01-21 DIAGNOSIS — Z79899 Other long term (current) drug therapy: Secondary | ICD-10-CM | POA: Insufficient documentation

## 2018-01-21 DIAGNOSIS — G43809 Other migraine, not intractable, without status migrainosus: Secondary | ICD-10-CM | POA: Insufficient documentation

## 2018-01-21 NOTE — ED Triage Notes (Signed)
Patient ambulatory to triage with steady gait, without difficulty or distress noted; pt reports generalized migraine since yesterday accomp by nausea; st hx of same

## 2018-01-22 ENCOUNTER — Encounter: Payer: Self-pay | Admitting: Emergency Medicine

## 2018-01-22 ENCOUNTER — Emergency Department
Admission: EM | Admit: 2018-01-22 | Discharge: 2018-01-22 | Disposition: A | Discharge status: Home / Self Care | Payer: Medicaid Other | Attending: Emergency Medicine | Admitting: Emergency Medicine

## 2018-01-22 ENCOUNTER — Other Ambulatory Visit: Payer: Self-pay | Admitting: Advanced Practice Midwife

## 2018-01-22 DIAGNOSIS — G43809 Other migraine, not intractable, without status migrainosus: Secondary | ICD-10-CM

## 2018-01-22 MED ORDER — ONDANSETRON 4 MG PO TBDP
ORAL_TABLET | ORAL | Status: AC
  Filled 2018-01-22: qty 1

## 2018-01-22 MED ORDER — SUMATRIPTAN SUCCINATE 6 MG/0.5ML ~~LOC~~ SOLN
6.0000 mg | Freq: Once | SUBCUTANEOUS | Status: AC
  Administered 2018-01-22: 6 mg via SUBCUTANEOUS
  Filled 2018-01-22: qty 0.5

## 2018-01-22 MED ORDER — ONDANSETRON 4 MG PO TBDP
4.0000 mg | ORAL_TABLET | Freq: Once | ORAL | Status: AC
  Administered 2018-01-22: 4 mg via ORAL

## 2018-01-22 NOTE — ED Provider Notes (Signed)
[facility] Emergency Department Provider Note    First MD Initiated Contact with Patient 01/22/18 [phone]     (approximate)  I have reviewed the triage vital signs and the nursing notes.   HISTORY  Chief Complaint Migraine    HPI Mary [name] is a 37 y.o. female with a list of chronic medical conditions including migraine headaches presents to the emergency department with headache consistent with previous migraines.  Patient states current pain score is 8 out of 10.  Patient scribes the pain is throbbing.  Patient denies any weakness numbness gait instability or visual changes.   Past Medical History:  Diagnosis Date  . Anxiety   . Asthma   . Cancer of cervix ([place])   . Cervical cancer ([place])   . Chronic back pain   . COPD (chronic obstructive pulmonary disease) ([place])   . Depression   . Physiological ovarian cysts   . S/P partial hysterectomy     Patient Active Problem List   Diagnosis Date Noted  . Cancer of cervix ([place])   . Right ovarian cyst 03/19/2014  . Trichimoniasis 03/19/2014  . DYSPNEA 11/26/2009  . ASTHMA 11/23/2009    Past Surgical History:  Procedure Laterality Date  . ABDOMINAL HYSTERECTOMY    . EYE SURGERY     Artificial right eye  . L ovarian removal Left     Prior to Admission medications   Medication Sig Start Date End Date Taking? Authorizing Provider  ALPRAZolam Duanne Moron) 0.5 MG tablet Take 0.5 mg by mouth at bedtime.     [provider]  benzonatate (TESSALON) 100 MG capsule Take 2 capsules (200 mg total) by mouth 3 (three) times daily as needed. 10/08/17   Evalee Jefferson, PA-C  diazepam (VALIUM) 10 MG tablet Take 1 tablet (10 mg total) by mouth every 8 (eight) hours as needed (Muscle spasm). Patient taking differently: Take 10 mg by mouth 2 (two) times daily.  08/18/15   Daleen Bo, MD  furosemide (LASIX) 20 MG tablet Take 20 mg by mouth daily.    [provider]  HYDROcodone-acetaminophen  (NORCO) 10-325 MG tablet Take 1 tablet by mouth 5 (five) times daily as needed for moderate pain or severe pain.    [provider]  lisinopril (PRINIVIL,ZESTRIL) 20 MG tablet Take 20 mg by mouth daily.    [provider]  naproxen (NAPROSYN) 500 MG tablet Take 1 tablet (500 mg total) by mouth 2 (two) times daily with a meal. 01/01/17   Triplett, Tammy, PA-C  promethazine-codeine (PHENERGAN WITH CODEINE) 6.25-10 MG/5ML syrup Take 5 mLs by mouth every 4 (four) hours as needed for cough. 10/08/17   Evalee Jefferson, PA-C    Allergies Buprenorphine hcl; Morphine; Morphine and related; Penicillins; Gabapentin; and Latex  Family History  Problem Relation Age of Onset  . Stroke Mother        TIA  . Stroke Father   . Hypertension Brother   . Asthma Daughter   . Sleep apnea Son   . Diabetes Maternal Grandmother   . Cancer Paternal Grandmother   . Cancer Brother        lung and thyroid    Social History Social History   Tobacco Use  . Smoking status: Never Smoker  . Smokeless tobacco: Never Used  Substance Use Topics  . Alcohol use: No  . Drug use: No    Review of Systems Constitutional: No fever/chills Eyes: No visual changes. ENT: No sore throat. Cardiovascular: Denies  chest pain. Respiratory: Denies shortness of breath. Gastrointestinal: No abdominal pain.  No nausea, no vomiting.  No diarrhea.  No constipation. Genitourinary: Negative for dysuria. Musculoskeletal: Negative for neck pain.  Negative for back pain. Integumentary: Negative for rash. Neurological: Positive for "migraine"  headaches, negative for focal weakness or numbness.  ____________________________________________   PHYSICAL EXAM:  VITAL SIGNS: ED Triage Vitals  Enc Vitals Group     BP 01/22/18 0000 (!) 159/92     Pulse Rate 01/22/18 0000 (!) 58     Resp 01/22/18 0000 18     Temp 01/22/18 0000 97.8 F (36.6 C)     Temp Source 01/22/18 0000 Oral     SpO2 01/22/18 0000 100 %     Weight  01/21/18 2359 (!) 138.8 kg (306 lb)     Height 01/21/18 2359 1.753 m (5\' 9" )     Head Circumference --      Peak Flow --      Pain Score 01/21/18 2359 8     Pain Loc --      Pain Edu? --      Excl. in [place]? --     Constitutional: Alert and oriented. Well appearing and in no acute distress. Eyes: Conjunctivae are normal. Head: Atraumatic. Mouth/Throat: Mucous membranes are moist.  Oropharynx non-erythematous. Neck: No stridor.  No meningeal signs.   Cardiovascular: Normal rate, regular rhythm. Good peripheral circulation. Grossly normal heart sounds. Respiratory: Normal respiratory effort.  No retractions. Lungs CTAB. Gastrointestinal: Soft and nontender. No distention.  Musculoskeletal: No lower extremity tenderness nor edema. No gross deformities of extremities. Neurologic:  Normal speech and language. No gross focal neurologic deficits are appreciated.  Skin:  Skin is warm, dry and intact. No rash noted. Psychiatric: Mood and affect are normal. Speech and behavior are normal.  ____________________________________________     Procedures   ____________________________________________   INITIAL IMPRESSION / ASSESSMENT AND PLAN / ED COURSE  As part of my medical decision making, I reviewed the following data within the electronic MEDICAL RECORD NUMBER   37 year old female presented with above-stated history and physical exam secondary to migraine headache which is consistent with previous migraine headache.  Patient given subcu Imitrex in the emergency department with resolution of pain. ____________________________________________  FINAL CLINICAL IMPRESSION(S) / ED DIAGNOSES  Final diagnoses:  Other migraine without status migrainosus, not intractable     MEDICATIONS GIVEN DURING THIS VISIT:  Medications  SUMAtriptan (IMITREX) injection 6 mg (6 mg Subcutaneous Given 01/22/18 0104)  ondansetron (ZOFRAN-ODT) disintegrating tablet 4 mg (4 mg Oral Given 01/22/18 0108)     ED  Discharge Orders    None       Note:  This document was prepared using Dragon voice recognition software and may include unintentional dictation errors.    Gregor Hams, MD 01/22/18 [phone]

## 2018-02-06 ENCOUNTER — Encounter: Payer: Self-pay | Admitting: Adult Health

## 2018-02-06 ENCOUNTER — Other Ambulatory Visit (HOSPITAL_COMMUNITY)
Admission: RE | Admit: 2018-02-06 | Discharge: 2018-02-06 | Disposition: A | Discharge status: Home / Self Care | Payer: Medicaid Other | Source: Ambulatory Visit | Attending: Adult Health | Admitting: Adult Health

## 2018-02-06 ENCOUNTER — Ambulatory Visit (INDEPENDENT_AMBULATORY_CARE_PROVIDER_SITE_OTHER): Payer: Medicaid Other | Admitting: Adult Health

## 2018-02-06 ENCOUNTER — Other Ambulatory Visit: Payer: Self-pay

## 2018-02-06 ENCOUNTER — Encounter (HOSPITAL_COMMUNITY): Payer: Self-pay

## 2018-02-06 VITALS — BP 120/89 | HR 76 | Ht 68.0 in | Wt 302.0 lb

## 2018-02-06 DIAGNOSIS — Z79899 Other long term (current) drug therapy: Secondary | ICD-10-CM | POA: Insufficient documentation

## 2018-02-06 DIAGNOSIS — J029 Acute pharyngitis, unspecified: Secondary | ICD-10-CM | POA: Insufficient documentation

## 2018-02-06 DIAGNOSIS — F419 Anxiety disorder, unspecified: Secondary | ICD-10-CM | POA: Insufficient documentation

## 2018-02-06 DIAGNOSIS — F329 Major depressive disorder, single episode, unspecified: Secondary | ICD-10-CM

## 2018-02-06 DIAGNOSIS — Z08 Encounter for follow-up examination after completed treatment for malignant neoplasm: Secondary | ICD-10-CM | POA: Diagnosis not present

## 2018-02-06 DIAGNOSIS — Z8541 Personal history of malignant neoplasm of cervix uteri: Secondary | ICD-10-CM | POA: Diagnosis present

## 2018-02-06 DIAGNOSIS — G35 Multiple sclerosis: Secondary | ICD-10-CM | POA: Insufficient documentation

## 2018-02-06 DIAGNOSIS — Z90710 Acquired absence of both cervix and uterus: Secondary | ICD-10-CM | POA: Diagnosis present

## 2018-02-06 DIAGNOSIS — Z91040 Latex allergy status: Secondary | ICD-10-CM | POA: Insufficient documentation

## 2018-02-06 DIAGNOSIS — Z309 Encounter for contraceptive management, unspecified: Secondary | ICD-10-CM

## 2018-02-06 DIAGNOSIS — Z01419 Encounter for gynecological examination (general) (routine) without abnormal findings: Secondary | ICD-10-CM | POA: Insufficient documentation

## 2018-02-06 DIAGNOSIS — J45909 Unspecified asthma, uncomplicated: Secondary | ICD-10-CM | POA: Insufficient documentation

## 2018-02-06 DIAGNOSIS — F339 Major depressive disorder, recurrent, unspecified: Secondary | ICD-10-CM | POA: Insufficient documentation

## 2018-02-06 DIAGNOSIS — Z3009 Encounter for other general counseling and advice on contraception: Secondary | ICD-10-CM | POA: Insufficient documentation

## 2018-02-06 DIAGNOSIS — R102 Pelvic and perineal pain: Secondary | ICD-10-CM | POA: Insufficient documentation

## 2018-02-06 DIAGNOSIS — F32A Depression, unspecified: Secondary | ICD-10-CM

## 2018-02-06 DIAGNOSIS — Z113 Encounter for screening for infections with a predominantly sexual mode of transmission: Secondary | ICD-10-CM | POA: Insufficient documentation

## 2018-02-06 NOTE — ED Triage Notes (Signed)
Pt c/o sore throat x 2 days, states had strep a couple of months ago and this feels the same.

## 2018-02-06 NOTE — Addendum Note (Signed)
Addended by: Linton Rump on: 02/06/2018 12:34 PM   Modules accepted: Orders

## 2018-02-06 NOTE — Progress Notes (Signed)
Patient ID: Mary [name], female   DOB: [birth year], 37 y.o.   MRN: [id number] History of Present Illness: Mary [name] is a 37 year old white female, G3P3, sp hysterectomy for cervical cancer in for a well woman gyn exam and pap.She works as PCA.  She has family planning medicaid. PCP is Dr Dorrene German, in [place].   Current Medications, Allergies, Past Medical History, Past Surgical History, Family History and Social History were reviewed in [employer] record.     Review of Systems: Patient denies any headaches, hearing loss, fatigue, blurred vision, shortness of breath, chest pain, problems with bowel movements, urination, or intercourse. No joint pain or mood swings.+weigh gain,she is interested in weight loss meds. +pain in abdomen and pelvic area and swelling +vaginal odor at times   Physical Exam:BP 120/89 (BP Location: Right Arm, Patient Position: Sitting, Cuff Size: Large)   Pulse 76   Ht 5\' 8"  (1.727 m)   Wt (!) 302 lb (137 kg)   BMI 45.92 kg/m  General:  Well developed, well nourished, no acute distress Skin:  Warm and dry Neck:  Midline trachea, normal thyroid, good ROM, no lymphadenopathy Lungs; Clear to auscultation bilaterally Breast:  No dominant palpable mass, retraction, or nipple discharge Cardiovascular: Regular rate and rhythm Abdomen:  Soft, +tender, no hepatosplenomegaly Pelvic:  External genitalia is normal in appearance, no lesions.  The vagina is normal in appearance. Urethra has no lesions or masses. The cervix and uterus are absent, pap with HPV and GC/CHL performed. No adnexal masses,+ tenderness noted,R>L.Bladder is non tender, no masses felt. Extremities/musculoskeletal:  No swelling or varicosities noted, no clubbing or cyanosis Psych:  No mood changes, alert and cooperative,seems happy PHQ 9 score 17, denies being suicidal, is taking xanax from PCP  Impression: 1. Encounter for well woman exam with routine gynecological exam    2. Vaginal Pap smear following hysterectomy for malignancy   3. History of cervical cancer   4. Family planning   5. Screening examination for STD (sexually transmitted disease)   6. Depression, unspecified depression type   7. Tenderness of female pelvic organs       Plan: Check HIV and RPR Will get vaginal [place] when she is ready,(she is self pay)She declines scheduling today, go to ER if pain increases F/U with PCP about labs and weight Pap and physical in 1 year Mammogram at 83

## 2018-02-07 ENCOUNTER — Other Ambulatory Visit: Payer: Self-pay

## 2018-02-07 ENCOUNTER — Emergency Department (HOSPITAL_COMMUNITY)
Admission: EM | Admit: 2018-02-07 | Discharge: 2018-02-07 | Disposition: A | Discharge status: Home / Self Care | Payer: Self-pay | Attending: Emergency Medicine | Admitting: Emergency Medicine

## 2018-02-07 DIAGNOSIS — J029 Acute pharyngitis, unspecified: Secondary | ICD-10-CM

## 2018-02-07 LAB — CYTOLOGY - PAP
Chlamydia: NEGATIVE
Diagnosis: NEGATIVE
HPV: NOT DETECTED
Neisseria Gonorrhea: NEGATIVE

## 2018-02-07 LAB — GROUP A STREP BY PCR: Group A Strep by PCR: NOT DETECTED

## 2018-02-07 LAB — RPR: RPR Ser Ql: NONREACTIVE

## 2018-02-07 LAB — HIV ANTIBODY (ROUTINE TESTING W REFLEX): HIV Screen 4th Generation wRfx: NONREACTIVE

## 2018-02-07 NOTE — ED Provider Notes (Signed)
[facility] EMERGENCY DEPARTMENT Provider Note   CSN: [id number] Arrival date & time: 02/06/18  2309     History   Chief Complaint Chief Complaint  Patient presents with  . Sore Throat    HPI Mary [name] is a 37 y.o. female.  The history is provided by the patient.  Sore Throat  This is a new problem. The current episode started 2 days ago. The problem occurs daily. The problem has been gradually worsening. The symptoms are aggravated by swallowing. The symptoms are relieved by rest.    Past Medical History:  Diagnosis Date  . Anxiety   . Asthma   . Bulging lumbar disc   . Cancer of cervix ([place])   . Cervical cancer ([place])   . Chronic back pain   . COPD (chronic obstructive pulmonary disease) ([place])   . Depression   . MS (multiple sclerosis) ([place])   . Physiological ovarian cysts   . S/P partial hysterectomy     Patient Active Problem List   Diagnosis Date Noted  . Screening examination for STD (sexually transmitted disease) 02/06/2018  . Family planning 02/06/2018  . History of cervical cancer 02/06/2018  . Vaginal Pap smear following hysterectomy for malignancy 02/06/2018  . Encounter for well woman exam with routine gynecological exam 02/06/2018  . Depression 02/06/2018  . Tenderness of female pelvic organs 02/06/2018  . Cancer of cervix ([place])   . Right ovarian cyst 03/19/2014  . Trichimoniasis 03/19/2014  . DYSPNEA 11/26/2009  . ASTHMA 11/23/2009    Past Surgical History:  Procedure Laterality Date  . ABDOMINAL HYSTERECTOMY    . EYE SURGERY     Artificial right eye  . L ovarian removal Left      OB History    Gravida  3   Para  3   Term  3   Preterm      AB      Living  3     SAB      TAB      Ectopic      Multiple      Live Births               Home Medications    Prior to Admission medications   Medication Sig Start Date End Date Taking? Authorizing Provider  ALPRAZolam Duanne Moron) 0.5 MG tablet Take 0.5 mg by mouth  at bedtime.     [provider]  diazepam (VALIUM) 10 MG tablet Take 1 tablet (10 mg total) by mouth every 8 (eight) hours as needed (Muscle spasm). Patient taking differently: Take 10 mg by mouth 2 (two) times daily.  08/18/15   Daleen Bo, MD  furosemide (LASIX) 20 MG tablet Take 40 mg by mouth daily.     [provider]  lisinopril (PRINIVIL,ZESTRIL) 20 MG tablet Take 20 mg by mouth daily.    [provider]  oxyCODONE (OXY IR/ROXICODONE) 5 MG immediate release tablet Take by mouth. 10/02/14   [provider]    Family History Family History  Problem Relation Age of Onset  . Stroke Mother        TIA  . Stroke Father   . Hypertension Brother   . Asthma Daughter   . Sleep apnea Son   . Diabetes Maternal Grandmother   . Cancer Paternal Grandmother   . Cancer Brother        lung and thyroid    Social History Social History   Tobacco Use  .  Smoking status: Never Smoker  . Smokeless tobacco: Never Used  Substance Use Topics  . Alcohol use: No  . Drug use: No     Allergies   Buprenorphine hcl; Morphine; Morphine and related; Penicillins; Gabapentin; and Latex   Review of Systems Review of Systems  Constitutional: Negative for fever.  HENT: Positive for sore throat. Negative for trouble swallowing.   Gastrointestinal: Negative for vomiting.     Physical Exam Updated Vital Signs BP (!) 143/95 (BP Location: Right Arm)   Pulse 73   Temp 98.5 F (36.9 C) (Oral)   Resp 17   Ht 1.753 m (5\' 9" )   Wt (!) 136.5 kg (301 lb)   SpO2 100%   BMI 44.45 kg/m   Physical Exam CONSTITUTIONAL: Well developed/well nourished HEAD: Normocephalic/atraumatic EYES: EOMI/PERRL ENMT: Mucous membranes moist, uvula midline, no edema, no exudates, no drooling, normal phonation NECK: supple no meningeal signs, no anterior neck edema or lymphadenopathy CV: S1/S2 noted, no murmurs/rubs/gallops noted LUNGS: Lungs are clear to auscultation bilaterally,  no apparent distress ABDOMEN: soft NEURO: Pt is awake/alert/appropriate, moves all extremitiesx4.   EXTREMITIES: pulses normal/equal SKIN: warm, color normal PSYCH: no abnormalities of mood noted, alert and oriented to situation   ED Treatments / Results  Labs (all labs ordered are listed, but only abnormal results are displayed) Labs Reviewed  GROUP A STREP BY PCR    EKG None  Radiology No results found.  Procedures Procedures (including critical care time)  Medications Ordered in ED Medications - No data to display   Initial Impression / Assessment and Plan / ED Course  I have reviewed the triage vital signs and the nursing notes.  Pertinent labs results that were available during my care of the patient were reviewed by me and considered in my medical decision making (see chart for details).     She is well-appearing, no distress.  Will discharge home  Final Clinical Impressions(s) / ED Diagnoses   Final diagnoses:  Pharyngitis, unspecified etiology    ED Discharge Orders    None       Ripley Fraise, MD 02/07/18 [phone]

## 2018-02-18 ENCOUNTER — Encounter: Payer: Self-pay | Admitting: Adult Health

## 2018-02-18 ENCOUNTER — Telehealth: Payer: Self-pay | Admitting: Adult Health

## 2018-02-18 DIAGNOSIS — N760 Acute vaginitis: Principal | ICD-10-CM

## 2018-02-18 DIAGNOSIS — B9689 Other specified bacterial agents as the cause of diseases classified elsewhere: Secondary | ICD-10-CM

## 2018-02-18 HISTORY — DX: Other specified bacterial agents as the cause of diseases classified elsewhere: B96.89

## 2018-02-18 MED ORDER — METRONIDAZOLE 500 MG PO TABS: 500.0000 mg | ORAL_TABLET | Freq: Three times a day (TID) | ORAL | 0 refills | Status: DC

## 2018-02-18 NOTE — Telephone Encounter (Signed)
Pt aware that pap was negative for malignancy, HPV,GC/CHL and + for trich and BV,wil treat with flagyl 500 mg tid for 7 days no sex or alcohol and do POC 7/11 at 3:30 pm. Her partner has been treated for trich at his doctor and had called her, to tell her he was +.

## 2018-02-28 ENCOUNTER — Ambulatory Visit: Payer: Medicaid Other | Admitting: Adult Health

## 2018-03-21 ENCOUNTER — Emergency Department (HOSPITAL_COMMUNITY)
Admission: EM | Admit: 2018-03-21 | Discharge: 2018-03-21 | Disposition: A | Discharge status: Home / Self Care | Payer: Medicaid Other | Attending: Emergency Medicine | Admitting: Emergency Medicine

## 2018-03-21 ENCOUNTER — Other Ambulatory Visit: Payer: Self-pay

## 2018-03-21 ENCOUNTER — Encounter (HOSPITAL_COMMUNITY): Payer: Self-pay | Admitting: Emergency Medicine

## 2018-03-21 ENCOUNTER — Emergency Department (HOSPITAL_COMMUNITY): Payer: Medicaid Other

## 2018-03-21 DIAGNOSIS — Z79899 Other long term (current) drug therapy: Secondary | ICD-10-CM | POA: Insufficient documentation

## 2018-03-21 DIAGNOSIS — R059 Cough, unspecified: Secondary | ICD-10-CM

## 2018-03-21 DIAGNOSIS — F419 Anxiety disorder, unspecified: Secondary | ICD-10-CM | POA: Insufficient documentation

## 2018-03-21 DIAGNOSIS — Z91040 Latex allergy status: Secondary | ICD-10-CM | POA: Insufficient documentation

## 2018-03-21 DIAGNOSIS — R05 Cough: Secondary | ICD-10-CM | POA: Insufficient documentation

## 2018-03-21 DIAGNOSIS — F329 Major depressive disorder, single episode, unspecified: Secondary | ICD-10-CM | POA: Insufficient documentation

## 2018-03-21 DIAGNOSIS — Z8541 Personal history of malignant neoplasm of cervix uteri: Secondary | ICD-10-CM | POA: Insufficient documentation

## 2018-03-21 DIAGNOSIS — J029 Acute pharyngitis, unspecified: Secondary | ICD-10-CM

## 2018-03-21 DIAGNOSIS — J45909 Unspecified asthma, uncomplicated: Secondary | ICD-10-CM | POA: Insufficient documentation

## 2018-03-21 LAB — GROUP A STREP BY PCR: Group A Strep by PCR: NOT DETECTED

## 2018-03-21 MED ORDER — PSEUDOEPHEDRINE HCL 60 MG PO TABS
60.0000 mg | ORAL_TABLET | Freq: Once | ORAL | Status: AC
  Administered 2018-03-21: 60 mg via ORAL
  Filled 2018-03-21: qty 1

## 2018-03-21 MED ORDER — IBUPROFEN 400 MG PO TABS
400.0000 mg | ORAL_TABLET | Freq: Once | ORAL | Status: AC
  Administered 2018-03-21: 400 mg via ORAL
  Filled 2018-03-21: qty 1

## 2018-03-21 NOTE — Discharge Instructions (Addendum)
Your blood pressure is elevated at 162/98, please have this rechecked soon.  The strep screen is negative.  Your chest x-ray is negative for acute problem.  I suspect that you have a viral sore throat and upper respiratory infection with cough.  Please use the Advil 400 mg every 6 hours as needed for fever and/or aching.  Please increase fluids.  Please use salt water gargles and Chloraseptic Spray for your comfort.  May use Delsym for assistance with your cough.  May use Sudafed for assistance with the congestion and postnasal drip.  Please see your primary physician for additional evaluation and management.

## 2018-03-21 NOTE — ED Triage Notes (Signed)
Pt states that she has had a sore throat and cough for about a week

## 2018-03-21 NOTE — ED Provider Notes (Signed)
[facility] Provider Note   CSN: [id number] Arrival date & time: 03/21/18  4098     History   Chief Complaint Chief Complaint  Patient presents with  . Sore Throat  . Cough    HPI Mary [name] is a 37 y.o. female.  Patient is a 37 year old female who presents to the emergency department with a complaint of sore throat and cough.  The patient states this is been going off and on for over a week.  She has tried some over-the-counter things and they have not been effective.  Patient also states that she has a cough.  She says when she had symptoms like this similarly she had strep as well as pneumonia.  She was concerned that this may be her situation on this occasion and she presents now for assistance with this issue.  It is of note that she has a history of chronic obstructive pulmonary disease, but states this does not bother her too often.  She denies being a smoker.  She is able to speak, and she is able to swallow, but she has pain with swallowing.  The history is provided by the patient.    Past Medical History:  Diagnosis Date  . Anxiety   . Asthma   . Bulging lumbar disc   . BV (bacterial vaginosis) 02/18/2018  . Cancer of cervix ([place])   . Cervical cancer ([place])   . Chronic back pain   . COPD (chronic obstructive pulmonary disease) ([place])   . Depression   . MS (multiple sclerosis) ([place])   . Physiological ovarian cysts   . S/P partial hysterectomy     Patient Active Problem List   Diagnosis Date Noted  . BV (bacterial vaginosis) 02/18/2018  . Screening examination for STD (sexually transmitted disease) 02/06/2018  . Family planning 02/06/2018  . History of cervical cancer 02/06/2018  . Vaginal Pap smear following hysterectomy for malignancy 02/06/2018  . Encounter for well woman exam with routine gynecological exam 02/06/2018  . Depression 02/06/2018  . Tenderness of female pelvic organs 02/06/2018  . Cancer of cervix ([place])   . Right  ovarian cyst 03/19/2014  . Trichimoniasis 03/19/2014  . DYSPNEA 11/26/2009  . ASTHMA 11/23/2009    Past Surgical History:  Procedure Laterality Date  . ABDOMINAL HYSTERECTOMY    . EYE SURGERY     Artificial right eye  . L ovarian removal Left      OB History    Gravida  3   Para  3   Term  3   Preterm      AB      Living  3     SAB      TAB      Ectopic      Multiple      Live Births               Home Medications    Prior to Admission medications   Medication Sig Start Date End Date Taking? Authorizing Provider  ALPRAZolam Duanne Moron) 0.5 MG tablet Take 0.5 mg by mouth at bedtime.    Yes [provider]  diazepam (VALIUM) 10 MG tablet Take 1 tablet (10 mg total) by mouth every 8 (eight) hours as needed (Muscle spasm). Patient taking differently: Take 10 mg by mouth 2 (two) times daily.  08/18/15  Yes Daleen Bo, MD  Fluticasone-Salmeterol (ADVAIR) 250-50 MCG/DOSE AEPB Inhale 1 puff into the lungs 2 (two) times daily.   Yes  [provider]  furosemide (LASIX) 20 MG tablet Take 40 mg by mouth daily.    Yes [provider]  Oxycodone HCl 10 MG TABS Take 10 mg by mouth 4 (four) times daily. 03/14/18  Yes [provider]  lisinopril (PRINIVIL,ZESTRIL) 20 MG tablet Take 20 mg by mouth daily.    [provider]  metroNIDAZOLE (FLAGYL) 500 MG tablet Take 1 tablet (500 mg total) by mouth 3 (three) times daily. Patient not taking: Reported on 03/21/2018 02/18/18   Estill Dooms, NP    Family History Family History  Problem Relation Age of Onset  . Stroke Mother        TIA  . Stroke Father   . Hypertension Brother   . Asthma Daughter   . Sleep apnea Son   . Diabetes Maternal Grandmother   . Cancer Paternal Grandmother   . Cancer Brother        lung and thyroid    Social History Social History   Tobacco Use  . Smoking status: Never Smoker  . Smokeless tobacco: Never Used  Substance Use Topics  .  Alcohol use: No  . Drug use: No     Allergies   Buprenorphine hcl; Morphine; Morphine and related; Penicillins; Gabapentin; and Latex   Review of Systems Review of Systems  Constitutional: Positive for appetite change. Negative for activity change, chills and fever.       All ROS Neg except as noted in HPI  HENT: Positive for congestion, postnasal drip and sore throat. Negative for nosebleeds.   Eyes: Negative for photophobia and discharge.  Respiratory: Negative for cough, shortness of breath and wheezing.   Cardiovascular: Negative for chest pain and palpitations.  Gastrointestinal: Negative for abdominal pain and blood in stool.  Genitourinary: Negative for dysuria, frequency and hematuria.  Musculoskeletal: Negative for arthralgias, back pain and neck pain.  Skin: Negative.   Neurological: Negative for dizziness, seizures and speech difficulty.  Psychiatric/Behavioral: Negative for confusion and hallucinations.     Physical Exam Updated Vital Signs BP (!) 162/98 (BP Location: Left Arm)   Pulse 70   Temp 98.1 F (36.7 C) (Oral)   Resp 14   Ht 5\' 9"  (1.753 m)   Wt 90.7 kg (200 lb)   SpO2 99%   BMI 29.53 kg/m   Physical Exam  Constitutional: She is oriented to person, place, and time. She appears well-developed and well-nourished.  Non-toxic appearance.  HENT:  Head: Normocephalic.  Right Ear: Tympanic membrane and external ear normal.  Left Ear: Tympanic membrane and external ear normal.  There is mild increased redness of the posterior pharynx.  The uvula is in the midline.  The airway is patent.  Eyes: Pupils are equal, round, and reactive to light. EOM and lids are normal.  Neck: Normal range of motion. Neck supple. Carotid bruit is not present.  Cardiovascular: Normal rate, regular rhythm, normal heart sounds, intact distal pulses and normal pulses.  Pulmonary/Chest: Breath sounds normal. No respiratory distress. She has no wheezes. She has no rhonchi. She has  no rales.  Abdominal: Soft. Bowel sounds are normal. There is no tenderness. There is no guarding.  Musculoskeletal: Normal range of motion.  Lymphadenopathy:       Head (right side): No submandibular adenopathy present.       Head (left side): No submandibular adenopathy present.    She has no cervical adenopathy.  Neurological: She is alert and oriented to person, place, and time. She has normal  strength. No cranial nerve deficit or sensory deficit.  Skin: Skin is warm and dry.  Psychiatric: She has a normal mood and affect. Her speech is normal.  Nursing note and vitals reviewed.    ED Treatments / Results  Labs (all labs ordered are listed, but only abnormal results are displayed) Labs Reviewed  GROUP A STREP BY PCR    EKG None  Radiology Dg Chest 2 View  Result Date: 03/21/2018 CLINICAL DATA:  Cough/right side chest pain/some sob/hx cervical cancer/copd EXAM: CHEST - 2 VIEW COMPARISON:  10/08/2017 FINDINGS: The heart size and mediastinal contours are within normal limits. Both lungs are clear. The visualized skeletal structures are unremarkable. IMPRESSION: No active cardiopulmonary disease. Electronically Signed   By: Nolon Nations M.D.   On: 03/21/2018 10:40    Procedures Procedures (including critical care time)  Medications Ordered in ED Medications - No data to display   Initial Impression / Assessment and Plan / ED Course  I have reviewed the triage vital signs and the nursing notes.  Pertinent labs & imaging results that were available during my care of the patient were reviewed by me and considered in my medical decision making (see chart for details).       Final Clinical Impressions(s) / ED Diagnoses MDM  Blood pressure is elevated at 162/98, otherwise vital signs within normal limits.  Pulse oximetry is 99% on room air.  Within normal limits by my interpretation.  The strep test is negative.  The chest x-ray is negative for acute problem.  I  suspect that the patient has a viral sore throat and upper respiratory infection with cough and postnasal drip.  I have asked the patient to use some Sudafed for the congestion and postnasal drip.  I have asked her to use salt water gargles and Chloraseptic spray for assistance with her throat.  She will use Advil for assistance with her pain or discomfort or any fever.  She is to follow-up with the primary physician concerning her blood pressure and also for any other changes in her condition.  Patient is in agreement with this plan.   Final diagnoses:  Viral pharyngitis  Cough    ED Discharge Orders    None       Lily Kocher, PA-C 03/21/18 1236    Pattricia Boss, MD 03/22/18 [phone]

## 2018-05-05 ENCOUNTER — Encounter (HOSPITAL_COMMUNITY): Payer: Self-pay | Admitting: Emergency Medicine

## 2018-05-05 ENCOUNTER — Other Ambulatory Visit: Payer: Self-pay

## 2018-05-05 ENCOUNTER — Emergency Department (HOSPITAL_COMMUNITY)
Admission: EM | Admit: 2018-05-05 | Discharge: 2018-05-05 | Disposition: A | Discharge status: Home / Self Care | Payer: Medicaid Other | Attending: Emergency Medicine | Admitting: Emergency Medicine

## 2018-05-05 DIAGNOSIS — Z79899 Other long term (current) drug therapy: Secondary | ICD-10-CM | POA: Insufficient documentation

## 2018-05-05 DIAGNOSIS — J45909 Unspecified asthma, uncomplicated: Secondary | ICD-10-CM | POA: Insufficient documentation

## 2018-05-05 DIAGNOSIS — R112 Nausea with vomiting, unspecified: Secondary | ICD-10-CM | POA: Insufficient documentation

## 2018-05-05 HISTORY — DX: Bipolar disorder, unspecified: F31.9

## 2018-05-05 LAB — RAPID URINE DRUG SCREEN, HOSP PERFORMED
Amphetamines: NOT DETECTED
Barbiturates: NOT DETECTED
Benzodiazepines: POSITIVE — AB
Cocaine: NOT DETECTED
Opiates: POSITIVE — AB
Tetrahydrocannabinol: NOT DETECTED

## 2018-05-05 LAB — URINALYSIS, ROUTINE W REFLEX MICROSCOPIC
Bilirubin Urine: NEGATIVE
Glucose, UA: NEGATIVE mg/dL
Hgb urine dipstick: NEGATIVE
Ketones, ur: NEGATIVE mg/dL
Leukocytes, UA: NEGATIVE
Nitrite: NEGATIVE
Protein, ur: NEGATIVE mg/dL
Specific Gravity, Urine: 1.017 (ref 1.005–1.030)
pH: 8 (ref 5.0–8.0)

## 2018-05-05 LAB — CBC WITH DIFFERENTIAL/PLATELET
Basophils Absolute: 0 10*3/uL (ref 0.0–0.1)
Basophils Relative: 0 %
Eosinophils Absolute: 0 10*3/uL (ref 0.0–0.7)
Eosinophils Relative: 0 %
HCT: 35.1 % — ABNORMAL LOW (ref 36.0–46.0)
Hemoglobin: 11.7 g/dL — ABNORMAL LOW (ref 12.0–15.0)
Lymphocytes Relative: 11 %
Lymphs Abs: 0.8 10*3/uL (ref 0.7–4.0)
MCH: 29.6 pg (ref 26.0–34.0)
MCHC: 33.3 g/dL (ref 30.0–36.0)
MCV: 88.9 fL (ref 78.0–100.0)
Monocytes Absolute: 0.3 10*3/uL (ref 0.1–1.0)
Monocytes Relative: 4 %
Neutro Abs: 6.4 10*3/uL (ref 1.7–7.7)
Neutrophils Relative %: 85 %
Platelets: 199 10*3/uL (ref 150–400)
RBC: 3.95 MIL/uL (ref 3.87–5.11)
RDW: 13 % (ref 11.5–15.5)
WBC: 7.5 10*3/uL (ref 4.0–10.5)

## 2018-05-05 LAB — COMPREHENSIVE METABOLIC PANEL
ALT: 21 U/L (ref 0–44)
AST: 18 U/L (ref 15–41)
Albumin: 4.3 g/dL (ref 3.5–5.0)
Alkaline Phosphatase: 86 U/L (ref 38–126)
Anion gap: 7 (ref 5–15)
BUN: 8 mg/dL (ref 6–20)
CO2: 29 mmol/L (ref 22–32)
Calcium: 9.3 mg/dL (ref 8.9–10.3)
Chloride: 103 mmol/L (ref 98–111)
Creatinine, Ser: 0.97 mg/dL (ref 0.44–1.00)
GFR calc Af Amer: >60 mL/min (ref >60)
GFR calc non Af Amer: >60 mL/min (ref >60)
Glucose, Bld: 103 mg/dL — ABNORMAL HIGH (ref 70–99)
Potassium: 3.9 mmol/L (ref 3.5–5.1)
Sodium: 139 mmol/L (ref 135–145)
Total Bilirubin: 0.9 mg/dL (ref 0.3–1.2)
Total Protein: 7.8 g/dL (ref 6.5–8.1)

## 2018-05-05 LAB — LIPASE, BLOOD: Lipase: 24 U/L (ref 11–51)

## 2018-05-05 MED ORDER — SODIUM CHLORIDE 0.9 % IV BOLUS
1000.0000 mL | Freq: Once | INTRAVENOUS | Status: AC
  Administered 2018-05-05: 1000 mL via INTRAVENOUS

## 2018-05-05 MED ORDER — ONDANSETRON HCL 4 MG/2ML IJ SOLN: 4.0000 mg | Freq: Once | INTRAMUSCULAR | Status: DC

## 2018-05-05 MED ORDER — ONDANSETRON HCL 4 MG PO TABS: 4.0000 mg | ORAL_TABLET | Freq: Three times a day (TID) | ORAL | 0 refills | Status: DC | PRN

## 2018-05-05 MED ORDER — ONDANSETRON 4 MG PO TBDP: 4.0000 mg | ORAL_TABLET | Freq: Once | ORAL | Status: DC

## 2018-05-05 MED ORDER — METOCLOPRAMIDE HCL 5 MG/ML IJ SOLN
10.0000 mg | Freq: Once | INTRAMUSCULAR | Status: AC
  Administered 2018-05-05: 10 mg via INTRAVENOUS
  Filled 2018-05-05: qty 2

## 2018-05-05 MED ORDER — DEXAMETHASONE SODIUM PHOSPHATE 10 MG/ML IJ SOLN
10.0000 mg | Freq: Once | INTRAMUSCULAR | Status: AC
  Administered 2018-05-05: 10 mg via INTRAVENOUS
  Filled 2018-05-05: qty 1
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
## 2018-05-05 NOTE — ED Notes (Signed)
Pt understands we need a urine sample. Unable to provide sample at this time. Pt will notify us when she can provide sample.
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
## 2018-05-06 MED FILL — Ondansetron HCl Tab 4 MG: ORAL | Qty: 4 | Status: AC
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
## 2018-08-29 ENCOUNTER — Emergency Department (HOSPITAL_COMMUNITY)
Admission: EM | Admit: 2018-08-29 | Discharge: 2018-08-29 | Disposition: A | Discharge status: Home / Self Care | Payer: Medicaid Other | Attending: Emergency Medicine | Admitting: Emergency Medicine

## 2018-08-29 ENCOUNTER — Other Ambulatory Visit: Payer: Self-pay

## 2018-08-29 ENCOUNTER — Encounter (HOSPITAL_COMMUNITY): Payer: Self-pay | Admitting: Emergency Medicine

## 2018-08-29 DIAGNOSIS — Y929 Unspecified place or not applicable: Secondary | ICD-10-CM | POA: Insufficient documentation

## 2018-08-29 DIAGNOSIS — G35 Multiple sclerosis: Secondary | ICD-10-CM | POA: Insufficient documentation

## 2018-08-29 DIAGNOSIS — J45909 Unspecified asthma, uncomplicated: Secondary | ICD-10-CM | POA: Insufficient documentation

## 2018-08-29 DIAGNOSIS — S471XXA Crushing injury of right shoulder and upper arm, initial encounter: Secondary | ICD-10-CM

## 2018-08-29 DIAGNOSIS — W231XXA Caught, crushed, jammed, or pinched between stationary objects, initial encounter: Secondary | ICD-10-CM | POA: Insufficient documentation

## 2018-08-29 DIAGNOSIS — F319 Bipolar disorder, unspecified: Secondary | ICD-10-CM | POA: Insufficient documentation

## 2018-08-29 DIAGNOSIS — Z8541 Personal history of malignant neoplasm of cervix uteri: Secondary | ICD-10-CM | POA: Insufficient documentation

## 2018-08-29 DIAGNOSIS — F419 Anxiety disorder, unspecified: Secondary | ICD-10-CM | POA: Insufficient documentation

## 2018-08-29 DIAGNOSIS — Z79899 Other long term (current) drug therapy: Secondary | ICD-10-CM | POA: Insufficient documentation

## 2018-08-29 DIAGNOSIS — Z91040 Latex allergy status: Secondary | ICD-10-CM | POA: Insufficient documentation

## 2018-08-29 DIAGNOSIS — Y998 Other external cause status: Secondary | ICD-10-CM | POA: Insufficient documentation

## 2018-08-29 DIAGNOSIS — J449 Chronic obstructive pulmonary disease, unspecified: Secondary | ICD-10-CM | POA: Insufficient documentation

## 2018-08-29 DIAGNOSIS — S5781XA Crushing injury of right forearm, initial encounter: Secondary | ICD-10-CM | POA: Insufficient documentation

## 2018-08-29 DIAGNOSIS — Y9389 Activity, other specified: Secondary | ICD-10-CM | POA: Insufficient documentation

## 2018-08-29 NOTE — Discharge Instructions (Addendum)
Please wear brace and elevated to help with swelling and pain Do range of motion exercises to prevent stiffness Follow up with your doctor Return if worsening

## 2018-08-29 NOTE — ED Notes (Signed)
Patient seen and assessed by EDPa for initial assessment.
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
## 2018-09-15 ENCOUNTER — Emergency Department (HOSPITAL_COMMUNITY)
Admission: EM | Admit: 2018-09-15 | Discharge: 2018-09-15 | Disposition: A | Discharge status: Home / Self Care | Payer: Medicaid Other | Attending: Emergency Medicine | Admitting: Emergency Medicine

## 2018-09-15 ENCOUNTER — Encounter (HOSPITAL_COMMUNITY): Payer: Self-pay

## 2018-09-15 ENCOUNTER — Other Ambulatory Visit: Payer: Self-pay

## 2018-09-15 DIAGNOSIS — Z79899 Other long term (current) drug therapy: Secondary | ICD-10-CM | POA: Insufficient documentation

## 2018-09-15 DIAGNOSIS — R21 Rash and other nonspecific skin eruption: Secondary | ICD-10-CM | POA: Insufficient documentation

## 2018-09-15 DIAGNOSIS — J449 Chronic obstructive pulmonary disease, unspecified: Secondary | ICD-10-CM | POA: Insufficient documentation

## 2018-09-15 DIAGNOSIS — Z91040 Latex allergy status: Secondary | ICD-10-CM | POA: Insufficient documentation

## 2018-09-15 DIAGNOSIS — J40 Bronchitis, not specified as acute or chronic: Secondary | ICD-10-CM

## 2018-09-15 MED ORDER — ALBUTEROL SULFATE (2.5 MG/3ML) 0.083% IN NEBU
5.0000 mg | INHALATION_SOLUTION | Freq: Once | RESPIRATORY_TRACT | Status: AC
  Administered 2018-09-15: 5 mg via RESPIRATORY_TRACT
  Filled 2018-09-15: qty 6

## 2018-09-15 MED ORDER — PREDNISONE 20 MG PO TABS: ORAL_TABLET | ORAL | 0 refills | Status: DC

## 2018-09-15 MED ORDER — PREDNISONE 50 MG PO TABS
60.0000 mg | ORAL_TABLET | Freq: Once | ORAL | Status: AC
  Administered 2018-09-15: 60 mg via ORAL
  Filled 2018-09-15: qty 1

## 2018-09-15 NOTE — ED Triage Notes (Signed)
Pt reports cough, nasal congestion, sore throat, and sneezing for a week. PT reports waking up this morning with blister to right lateral upper arm

## 2018-09-15 NOTE — ED Provider Notes (Signed)
[facility] EMERGENCY DEPARTMENT Provider Note   CSN: [id number] Arrival date & time: 09/15/18  0024  Time seen 1:30 AM   History   Chief Complaint Chief Complaint  Patient presents with  . cold symtoms    HPI Mary [name] is a 38 y.o. female.  HPI patient states January 21 she started getting clear rhinorrhea, sneezing, nonproductive cough.  She states her throat only hurts while she coughs.  She denies any vomiting or diarrhea.  She thought she may have had some wheezing in the morning on the 25th.  She denies feeling short of breath.  She states she is on Advair and albuterol and they are not helping.  She denies being around anybody else who is sick.  She states that about the same time she had a rash on her left forearm and she had a small area on her right proximal forearm that had a clear blister that ruptured.  She states it is pruritic.  She has a lot of environmental allergies and states that about the same time she went to visit her dad who brought a inside dog into the house.  He denies any change in soaps or detergents.  PCP Dorrene German, NP   Past Medical History:  Diagnosis Date  . Anxiety   . Asthma   . Bipolar 1 disorder ([place])   . Bulging lumbar disc   . BV (bacterial vaginosis) 02/18/2018  . Cancer of cervix ([place])   . Cervical cancer ([place])   . Chronic back pain   . COPD (chronic obstructive pulmonary disease) ([place])   . Depression   . MS (multiple sclerosis) ([place])   . Physiological ovarian cysts   . S/P partial hysterectomy     Patient Active Problem List   Diagnosis Date Noted  . BV (bacterial vaginosis) 02/18/2018  . Screening examination for STD (sexually transmitted disease) 02/06/2018  . Family planning 02/06/2018  . History of cervical cancer 02/06/2018  . Vaginal Pap smear following hysterectomy for malignancy 02/06/2018  . Encounter for well woman exam with routine gynecological exam 02/06/2018  . Depression 02/06/2018  . Tenderness of  female pelvic organs 02/06/2018  . Cancer of cervix ([place])   . Right ovarian cyst 03/19/2014  . Trichimoniasis 03/19/2014  . DYSPNEA 11/26/2009  . ASTHMA 11/23/2009    Past Surgical History:  Procedure Laterality Date  . ABDOMINAL HYSTERECTOMY    . EYE SURGERY     Artificial right eye  . L ovarian removal Left      OB History    Gravida  3   Para  3   Term  3   Preterm      AB      Living  3     SAB      TAB      Ectopic      Multiple      Live Births               Home Medications    Prior to Admission medications   Medication Sig Start Date End Date Taking? Authorizing Provider  ALPRAZolam Duanne Moron) 0.5 MG tablet Take 0.5 mg by mouth at bedtime.     [provider]  diazepam (VALIUM) 10 MG tablet Take 1 tablet (10 mg total) by mouth every 8 (eight) hours as needed (Muscle spasm). Patient taking differently: Take 10 mg by mouth 2 (two) times daily as needed for anxiety.  08/18/15   Daleen Bo, MD  Fluticasone-Salmeterol (ADVAIR) 250-50 MCG/DOSE AEPB Inhale 1 puff into the lungs 2 (two) times daily.    [provider]  furosemide (LASIX) 20 MG tablet Take 40 mg by mouth daily.     [provider]  lisinopril (PRINIVIL,ZESTRIL) 20 MG tablet Take 20 mg by mouth daily.    [provider]  ondansetron (ZOFRAN) 4 MG tablet Take 1 tablet (4 mg total) by mouth every 8 (eight) hours as needed for nausea or vomiting. 05/05/18   Idol, Almyra Free, PA-C  Oxycodone HCl 10 MG TABS Take 10 mg by mouth 4 (four) times daily. 03/14/18   [provider]  predniSONE (DELTASONE) 20 MG tablet Take 3 po QD x 3d , then 2 po QD x 3d then 1 po QD x 3d 09/15/18   Rolland Porter, MD    Family History Family History  Problem Relation Age of Onset  . Stroke Mother        TIA  . Stroke Father   . Hypertension Brother   . Asthma Daughter   . Sleep apnea Son   . Diabetes Maternal Grandmother   . Cancer Paternal Grandmother   . Cancer Brother          lung and thyroid    Social History Social History   Tobacco Use  . Smoking status: Never Smoker  . Smokeless tobacco: Never Used  Substance Use Topics  . Alcohol use: No  . Drug use: No     Allergies   Buprenorphine hcl; Morphine; Morphine and related; Penicillins; Gabapentin; and Latex   Review of Systems Review of Systems  All other systems reviewed and are negative.    Physical Exam Updated Vital Signs BP (!) 145/106 (BP Location: Left Arm)   Pulse 84   Temp 98.6 F (37 C) (Oral)   Resp 18   Ht 5\' 9"  (1.753 m)   Wt 90.7 kg   SpO2 99%   BMI 29.53 kg/m   Vital signs normal except diastolic hypertension   Physical Exam Vitals signs and nursing note reviewed.  Constitutional:      General: She is not in acute distress.    Appearance: Normal appearance. She is obese. She is not ill-appearing.  HENT:     Head: Normocephalic and atraumatic.     Right Ear: External ear normal.     Left Ear: External ear normal.     Nose: Nose normal.     Mouth/Throat:     Mouth: Mucous membranes are moist.     Pharynx: Oropharynx is clear. No oropharyngeal exudate or posterior oropharyngeal erythema.  Eyes:     Extraocular Movements: Extraocular movements intact.     Conjunctiva/sclera: Conjunctivae normal.     Comments: Patient has a right artificial eye  Neck:     Musculoskeletal: Normal range of motion and neck supple.  Cardiovascular:     Rate and Rhythm: Normal rate and regular rhythm.     Pulses: Normal pulses.  Pulmonary:     Effort: No tachypnea, accessory muscle usage, prolonged expiration or respiratory distress.     Breath sounds: Decreased air movement present.  Skin:    Comments: Patient has an area on her left forearm where there are some small red raised on a faintly red base.  She has an area on her proximal right forearm that has a superficial epidermal blister that appears to ruptured.  It is about the size of 2 cm with a blister being about 3 to  4  mm in size.  Neurological:     Mental Status: She is alert.    Left forearm     ED Treatments / Results  Labs (all labs ordered are listed, but only abnormal results are displayed) Labs Reviewed - No data to display  EKG None  Radiology No results found.  Procedures Procedures (including critical care time)  Medications Ordered in ED Medications  albuterol (PROVENTIL) (2.5 MG/3ML) 0.083% nebulizer solution 5 mg (5 mg Nebulization Given 09/15/18 0207)  predniSONE (DELTASONE) tablet 60 mg (60 mg Oral Given 09/15/18 0240)     Initial Impression / Assessment and Plan / ED Course  I have reviewed the triage vital signs and the nursing notes.  Pertinent labs & imaging results that were available during my care of the patient were reviewed by me and considered in my medical decision making (see chart for details).     Patient was given an albuterol nebulizer due to her diminished breath sounds.  Patient is a non-smoker, at this point her cough is nonproductive.  I do not feel like she is going to need antibiotics.  I will probably put her on steroids for her breathing and her rash.  Recheck at 3:20 AM patient is sleeping and snoring.  When awakened she states she feels better.  She is noted to be taking much bigger deeper breaths now.  She has some coarse breath sounds.  She does not want another nebulizer.  I am going to put her on prednisone which should help with her rash and her breathing.  Patient is a non-smoker so at this point I do not feel antibiotics are warranted.  Final Clinical Impressions(s) / ED Diagnoses   Final diagnoses:  Bronchitis  Rash and nonspecific skin eruption    ED Discharge Orders         Ordered    predniSONE (DELTASONE) 20 MG tablet  Status:  Discontinued     09/15/18 0342    predniSONE (DELTASONE) 20 MG tablet     09/15/18 0343        mucinex DM OTC  Plan discharge  Rolland Porter, MD, Barbette Or, MD 09/15/18 [phone]

## 2018-09-15 NOTE — Discharge Instructions (Signed)
Drink plenty of fluids.  Use your albuterol inhaler, sometimes called Proventil, 2 puffs every 4 hours as needed for wheezing and coughing.  You can take Mucinex DM over-the-counter for your cough.  Take the prednisone until gone which should also help with your rash.  For itching you can put Benadryl ointment on the rash or you can take Benadryl orally 50 mg every 6 hours as needed.  Recheck if you get a fever, start coughing up mucus, struggle to breathe, or seem worse.

## 2018-10-19 DIAGNOSIS — J449 Chronic obstructive pulmonary disease, unspecified: Secondary | ICD-10-CM | POA: Insufficient documentation

## 2018-10-19 DIAGNOSIS — N3001 Acute cystitis with hematuria: Secondary | ICD-10-CM | POA: Insufficient documentation

## 2018-10-19 DIAGNOSIS — Z79899 Other long term (current) drug therapy: Secondary | ICD-10-CM | POA: Insufficient documentation

## 2018-10-19 DIAGNOSIS — J45909 Unspecified asthma, uncomplicated: Secondary | ICD-10-CM | POA: Insufficient documentation

## 2018-10-20 ENCOUNTER — Encounter (HOSPITAL_COMMUNITY): Payer: Self-pay | Admitting: *Deleted

## 2018-10-20 ENCOUNTER — Other Ambulatory Visit: Payer: Self-pay

## 2018-10-20 ENCOUNTER — Emergency Department (HOSPITAL_COMMUNITY)
Admission: EM | Admit: 2018-10-20 | Discharge: 2018-10-20 | Disposition: A | Discharge status: Home / Self Care | Payer: Medicaid Other | Attending: Emergency Medicine | Admitting: Emergency Medicine

## 2018-10-20 DIAGNOSIS — N3001 Acute cystitis with hematuria: Secondary | ICD-10-CM

## 2018-10-20 LAB — URINALYSIS, ROUTINE W REFLEX MICROSCOPIC
Bilirubin Urine: NEGATIVE
Glucose, UA: NEGATIVE mg/dL
Hgb urine dipstick: NEGATIVE
Ketones, ur: NEGATIVE mg/dL
Nitrite: NEGATIVE
Protein, ur: NEGATIVE mg/dL
RBC / HPF: >50 RBC/hpf — ABNORMAL HIGH (ref 0–5)
Specific Gravity, Urine: 1.018 (ref 1.005–1.030)
WBC, UA: >50 WBC/hpf — ABNORMAL HIGH (ref 0–5)
pH: 6 (ref 5.0–8.0)

## 2018-10-20 LAB — PREGNANCY, URINE: Preg Test, Ur: NEGATIVE

## 2018-10-20 MED ORDER — SULFAMETHOXAZOLE-TRIMETHOPRIM 800-160 MG PO TABS
1.0000 | ORAL_TABLET | Freq: Once | ORAL | Status: AC
  Administered 2018-10-20: 1 via ORAL
  Filled 2018-10-20: qty 1

## 2018-10-20 MED ORDER — SULFAMETHOXAZOLE-TRIMETHOPRIM 800-160 MG PO TABS: 1.0000 | ORAL_TABLET | Freq: Two times a day (BID) | ORAL | 0 refills | Status: DC

## 2018-10-20 MED ORDER — PHENAZOPYRIDINE HCL 100 MG PO TABS
200.0000 mg | ORAL_TABLET | Freq: Once | ORAL | Status: AC
  Administered 2018-10-20: 200 mg via ORAL
  Filled 2018-10-20: qty 2

## 2018-10-20 MED ORDER — PHENAZOPYRIDINE HCL 200 MG PO TABS: 200.0000 mg | ORAL_TABLET | Freq: Three times a day (TID) | ORAL | 0 refills | Status: DC

## 2018-10-20 NOTE — ED Provider Notes (Signed)
[facility] EMERGENCY DEPARTMENT Provider Note   CSN: [id number] Arrival date & time: 10/19/18  2357  Time seen 12:46 AM  History   Chief Complaint Chief Complaint  Patient presents with  . Abdominal Pain    HPI Mary [name] is a 38 y.o. female.     HPI patient reports 2 days ago about 8:30 in the morning she was awakened with bilateral lower back pain and when she urinated it was extremely painful like "passing knives".  When she wiped she saw blood.  She complains of persistent dysuria and frequency and states she was just urinating small amounts now she is urinating more but she also is on fluid pills.  She denies nausea, vomiting, or fever.  She states she does have chills.  She states she has bilateral lower abdominal pain that radiates into her lower bilateral back.  She has chronic back pain and takes oxycodone for and states this back pain is different.  She is status post hysterectomy.  PCP Dorrene German, NP   Past Medical History:  Diagnosis Date  . Anxiety   . Asthma   . Bipolar 1 disorder ([place])   . Bulging lumbar disc   . BV (bacterial vaginosis) 02/18/2018  . Cancer of cervix ([place])   . Cervical cancer ([place])   . Chronic back pain   . COPD (chronic obstructive pulmonary disease) ([place])   . Depression   . MS (multiple sclerosis) ([place])   . Physiological ovarian cysts   . S/P partial hysterectomy     Patient Active Problem List   Diagnosis Date Noted  . BV (bacterial vaginosis) 02/18/2018  . Screening examination for STD (sexually transmitted disease) 02/06/2018  . Family planning 02/06/2018  . History of cervical cancer 02/06/2018  . Vaginal Pap smear following hysterectomy for malignancy 02/06/2018  . Encounter for well woman exam with routine gynecological exam 02/06/2018  . Depression 02/06/2018  . Tenderness of female pelvic organs 02/06/2018  . Cancer of cervix ([place])   . Right ovarian cyst 03/19/2014  . Trichimoniasis 03/19/2014  . DYSPNEA  11/26/2009  . ASTHMA 11/23/2009    Past Surgical History:  Procedure Laterality Date  . ABDOMINAL HYSTERECTOMY    . EYE SURGERY     Artificial right eye  . L ovarian removal Left      OB History    Gravida  3   Para  3   Term  3   Preterm      AB      Living  3     SAB      TAB      Ectopic      Multiple      Live Births               Home Medications    Prior to Admission medications   Medication Sig Start Date End Date Taking? Authorizing Provider  ALPRAZolam Duanne Moron) 0.5 MG tablet Take 0.5 mg by mouth at bedtime.    Yes [provider]  diazepam (VALIUM) 10 MG tablet Take 1 tablet (10 mg total) by mouth every 8 (eight) hours as needed (Muscle spasm). Patient taking differently: Take 10 mg by mouth 2 (two) times daily as needed for anxiety.  08/18/15  Yes Daleen Bo, MD  Fluticasone-Salmeterol (ADVAIR) 250-50 MCG/DOSE AEPB Inhale 1 puff into the lungs 2 (two) times daily.   Yes [provider]  furosemide (LASIX) 20 MG tablet Take 40 mg by mouth daily.  Yes [provider]  lisinopril (PRINIVIL,ZESTRIL) 20 MG tablet Take 20 mg by mouth daily.   Yes [provider]  ondansetron (ZOFRAN) 4 MG tablet Take 1 tablet (4 mg total) by mouth every 8 (eight) hours as needed for nausea or vomiting. 05/05/18  Yes Idol, Almyra Free, PA-C  Oxycodone HCl 10 MG TABS Take 10 mg by mouth 4 (four) times daily. 03/14/18  Yes [provider]  predniSONE (DELTASONE) 20 MG tablet Take 3 po QD x 3d , then 2 po QD x 3d then 1 po QD x 3d 09/15/18  Yes Rolland Porter, MD  sulfamethoxazole-trimethoprim (BACTRIM DS,SEPTRA DS) 800-160 MG tablet Take 1 tablet by mouth 2 (two) times daily. 10/20/18   Rolland Porter, MD    Family History Family History  Problem Relation Age of Onset  . Stroke Mother        TIA  . Stroke Father   . Hypertension Brother   . Asthma Daughter   . Sleep apnea Son   . Diabetes Maternal Grandmother   . Cancer Paternal  Grandmother   . Cancer Brother        lung and thyroid    Social History Social History   Tobacco Use  . Smoking status: Never Smoker  . Smokeless tobacco: Never Used  Substance Use Topics  . Alcohol use: No  . Drug use: No  Applying for disability for mental health   Allergies   Buprenorphine hcl; Morphine; Morphine and related; Penicillins; Gabapentin; and Latex   Review of Systems Review of Systems  All other systems reviewed and are negative.    Physical Exam Updated Vital Signs BP (!) 146/106 (BP Location: Left Arm)   Pulse 84   Temp 98 F (36.7 C) (Oral)   Resp 18   Ht 5\' 9"  (1.753 m)   Wt (!) 146.5 kg   SpO2 100%   BMI 47.70 kg/m   Physical Exam Vitals signs and nursing note reviewed.  Constitutional:      General: She is not in acute distress.    Appearance: She is well-developed. She is obese.     Comments: Calmly sitting on the stretcher in no distress  HENT:     Head: Normocephalic and atraumatic.     Right Ear: External ear normal.     Left Ear: External ear normal.     Nose: Nose normal.     Mouth/Throat:     Mouth: Mucous membranes are moist.  Eyes:     Conjunctiva/sclera: Conjunctivae normal.     Comments: Patient has artificial right eye, it does not move when her other eye moves but remains stationary  Neck:     Musculoskeletal: Normal range of motion and neck supple.  Cardiovascular:     Rate and Rhythm: Normal rate and regular rhythm.     Heart sounds: No murmur.  Pulmonary:     Effort: Pulmonary effort is normal. No respiratory distress.     Breath sounds: Normal breath sounds.  Abdominal:     Tenderness: There is abdominal tenderness.       Comments: She is tender over the suprapubic area.  She does not have any CVA tenderness.  Musculoskeletal: Normal range of motion.       Back:     Comments: Patient is tender diffusely over the sacral area bilaterally.  She states that hurts more than when I percuss over her flank area.    Skin:    General: Skin is warm and  dry.     Findings: No erythema.  Neurological:     General: No focal deficit present.     Mental Status: She is alert and oriented to person, place, and time.  Psychiatric:        Mood and Affect: Mood normal.        Behavior: Behavior normal.        Thought Content: Thought content normal.      ED Treatments / Results  Labs (all labs ordered are listed, but only abnormal results are displayed) Results for orders placed or performed during the hospital encounter of 10/20/18  Urinalysis, Routine w reflex microscopic  Result Value Ref Range   Color, Urine YELLOW YELLOW   APPearance HAZY (A) CLEAR   Specific Gravity, Urine 1.018 1.005 - 1.030   pH 6.0 5.0 - 8.0   Glucose, UA NEGATIVE NEGATIVE mg/dL   Hgb urine dipstick NEGATIVE NEGATIVE   Bilirubin Urine NEGATIVE NEGATIVE   Ketones, ur NEGATIVE NEGATIVE mg/dL   Protein, ur NEGATIVE NEGATIVE mg/dL   Nitrite NEGATIVE NEGATIVE   Leukocytes,Ua LARGE (A) NEGATIVE   RBC / HPF >50 (H) 0 - 5 RBC/hpf   WBC, UA >50 (H) 0 - 5 WBC/hpf   Bacteria, UA RARE (A) NONE SEEN   Squamous Epithelial / LPF 21-50 0 - 5   Mucus PRESENT   Pregnancy, urine  Result Value Ref Range   Preg Test, Ur NEGATIVE NEGATIVE   Laboratory interpretation all normal except possible UTI, however it is a contaminated specimen    EKG None  Radiology No results found.  Procedures Procedures (including critical care time)  Medications Ordered in ED Medications  phenazopyridine (PYRIDIUM) tablet 200 mg (200 mg Oral Given 10/20/18 0124)  sulfamethoxazole-trimethoprim (BACTRIM DS,SEPTRA DS) 800-160 MG per tablet 1 tablet (1 tablet Oral Given 10/20/18 0124)     Initial Impression / Assessment and Plan / ED Course  I have reviewed the triage vital signs and the nursing notes.  Pertinent labs & imaging results that were available during my care of the patient were reviewed by me and considered in my medical decision making  (see chart for details).    When I review patient's chart she had a renal stone CT done in April 2017 that was without any renal stones.  Patient keeps repeating over and over again "I have never had a kidney stone so I do not know what they feel like".  However her pain is bilateral, she is sitting calmly without any distress.  My index of suspicion for kidney stone is extremely low.  I explained to her her symptoms are more consistent with a urinary tract infection.  She is without vomiting or fever so she can be treated with oral antibiotics.  Urine culture was sent to the lab.    Patient states she only takes 1 or maybe 2 oxycodone a day.  When I review the [place] she gets #30 alprazolam 0.5 mg tablets filled on February 13, #60 Valium 5 mg tablets filled on February 13, and #90 oxycodone 5 mg tablets last filled January 16.  It appears she has probably run out of her oxycodone and it is not clear why she has not gotten it refilled.  Her overdose risk was 600.  Of interest she is here with her roommate who is also a patient being seen with similar complaints.  Final Clinical Impressions(s) / ED Diagnoses   Final diagnoses:  Acute cystitis with hematuria  ED Discharge Orders         Ordered    sulfamethoxazole-trimethoprim (BACTRIM DS,SEPTRA DS) 800-160 MG tablet  2 times daily     10/20/18 0127        OTC ibuprofen and acetaminophen  Plan discharge  Rolland Porter, MD, Barbette Or, MD 10/20/18 0130

## 2018-10-20 NOTE — ED Triage Notes (Signed)
Pt c/o lower back pain , abd pain, and noticing blood when pt urinated and wiped yesterday, denies any n/v

## 2018-10-20 NOTE — Discharge Instructions (Addendum)
Drink plenty of fluids.  Take ibuprofen 600 mg plus acetaminophen 650 mg every 6 hours as needed for pain.  You  also have your oxycodone that you can take for severe pain.  Take the antibiotics until gone.  Take the Pyridium for pain on urination.  Have NP Stone recheck you if you are improving in the next couple of days.

## 2018-10-21 LAB — URINE CULTURE: Special Requests: NORMAL

## 2018-11-09 ENCOUNTER — Emergency Department (HOSPITAL_COMMUNITY): Payer: Medicaid Other

## 2018-11-09 ENCOUNTER — Emergency Department (HOSPITAL_COMMUNITY)
Admission: EM | Admit: 2018-11-09 | Discharge: 2018-11-09 | Disposition: A | Discharge status: Home / Self Care | Payer: Medicaid Other | Attending: Emergency Medicine | Admitting: Emergency Medicine

## 2018-11-09 ENCOUNTER — Other Ambulatory Visit: Payer: Self-pay

## 2018-11-09 ENCOUNTER — Encounter (HOSPITAL_COMMUNITY): Payer: Self-pay | Admitting: Emergency Medicine

## 2018-11-09 DIAGNOSIS — Y929 Unspecified place or not applicable: Secondary | ICD-10-CM | POA: Insufficient documentation

## 2018-11-09 DIAGNOSIS — W2209XA Striking against other stationary object, initial encounter: Secondary | ICD-10-CM | POA: Insufficient documentation

## 2018-11-09 DIAGNOSIS — S60221A Contusion of right hand, initial encounter: Secondary | ICD-10-CM | POA: Insufficient documentation

## 2018-11-09 DIAGNOSIS — Z79899 Other long term (current) drug therapy: Secondary | ICD-10-CM | POA: Insufficient documentation

## 2018-11-09 DIAGNOSIS — J45909 Unspecified asthma, uncomplicated: Secondary | ICD-10-CM | POA: Insufficient documentation

## 2018-11-09 DIAGNOSIS — Y999 Unspecified external cause status: Secondary | ICD-10-CM | POA: Insufficient documentation

## 2018-11-09 DIAGNOSIS — J449 Chronic obstructive pulmonary disease, unspecified: Secondary | ICD-10-CM | POA: Insufficient documentation

## 2018-11-09 DIAGNOSIS — Y9389 Activity, other specified: Secondary | ICD-10-CM | POA: Insufficient documentation

## 2018-11-09 DIAGNOSIS — Z8541 Personal history of malignant neoplasm of cervix uteri: Secondary | ICD-10-CM | POA: Insufficient documentation

## 2018-11-09 DIAGNOSIS — Z91040 Latex allergy status: Secondary | ICD-10-CM | POA: Insufficient documentation

## 2018-11-09 MED ORDER — IBUPROFEN 800 MG PO TABS
800.0000 mg | ORAL_TABLET | Freq: Once | ORAL | Status: AC
  Administered 2018-11-09: 800 mg via ORAL
  Filled 2018-11-09: qty 1
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
## 2018-11-09 NOTE — ED Notes (Signed)
Patient transported to X-ray 

## 2019-02-03 DIAGNOSIS — Z029 Encounter for administrative examinations, unspecified: Secondary | ICD-10-CM

## 2019-02-18 ENCOUNTER — Telehealth: Payer: Self-pay | Admitting: Adult Health

## 2019-02-18 NOTE — Telephone Encounter (Signed)
Pt had [facility] and is having vaginal bleeding and worse after sex, feels bloated, swelling in feet and ankles and has nausea and  vomiting, had diarrhea 2 days ago.Feels like when she was pregnant. appt made with Dr Glo Herring on 7/7/ at 2:15 pm

## 2019-02-24 ENCOUNTER — Telehealth: Payer: Self-pay | Admitting: Obstetrics and Gynecology

## 2019-02-24 NOTE — Telephone Encounter (Signed)

## 2019-02-25 ENCOUNTER — Ambulatory Visit: Payer: Medicaid Other | Admitting: Obstetrics and Gynecology

## 2019-03-29 ENCOUNTER — Other Ambulatory Visit: Payer: Self-pay

## 2019-03-29 DIAGNOSIS — Z79899 Other long term (current) drug therapy: Secondary | ICD-10-CM | POA: Insufficient documentation

## 2019-03-29 DIAGNOSIS — I10 Essential (primary) hypertension: Secondary | ICD-10-CM | POA: Insufficient documentation

## 2019-03-29 DIAGNOSIS — W108XXA Fall (on) (from) other stairs and steps, initial encounter: Secondary | ICD-10-CM | POA: Insufficient documentation

## 2019-03-29 DIAGNOSIS — S8000XA Contusion of unspecified knee, initial encounter: Secondary | ICD-10-CM | POA: Insufficient documentation

## 2019-03-29 DIAGNOSIS — Z91040 Latex allergy status: Secondary | ICD-10-CM | POA: Insufficient documentation

## 2019-03-29 DIAGNOSIS — T63461A Toxic effect of venom of wasps, accidental (unintentional), initial encounter: Secondary | ICD-10-CM | POA: Insufficient documentation

## 2019-03-29 DIAGNOSIS — J449 Chronic obstructive pulmonary disease, unspecified: Secondary | ICD-10-CM | POA: Insufficient documentation

## 2019-03-29 DIAGNOSIS — Y929 Unspecified place or not applicable: Secondary | ICD-10-CM | POA: Insufficient documentation

## 2019-03-29 DIAGNOSIS — Y939 Activity, unspecified: Secondary | ICD-10-CM | POA: Insufficient documentation

## 2019-03-29 DIAGNOSIS — Y999 Unspecified external cause status: Secondary | ICD-10-CM | POA: Insufficient documentation

## 2019-03-30 ENCOUNTER — Other Ambulatory Visit: Payer: Self-pay

## 2019-03-30 ENCOUNTER — Emergency Department (HOSPITAL_COMMUNITY): Payer: Medicaid Other

## 2019-03-30 ENCOUNTER — Emergency Department (HOSPITAL_COMMUNITY)
Admission: EM | Admit: 2019-03-30 | Discharge: 2019-03-30 | Disposition: A | Discharge status: Home / Self Care | Payer: Medicaid Other | Attending: Emergency Medicine | Admitting: Emergency Medicine

## 2019-03-30 ENCOUNTER — Encounter (HOSPITAL_COMMUNITY): Payer: Self-pay | Admitting: *Deleted

## 2019-03-30 DIAGNOSIS — T63461A Toxic effect of venom of wasps, accidental (unintentional), initial encounter: Secondary | ICD-10-CM

## 2019-03-30 DIAGNOSIS — S8000XA Contusion of unspecified knee, initial encounter: Secondary | ICD-10-CM

## 2019-03-30 HISTORY — DX: Post-traumatic stress disorder, unspecified: F43.10

## 2019-03-30 HISTORY — DX: Essential (primary) hypertension: I10

## 2019-03-30 NOTE — ED Provider Notes (Signed)
[facility] EMERGENCY DEPARTMENT Provider Note   CSN: [id number] Arrival date & time: 03/29/19  2349    History   Chief Complaint Chief Complaint  Patient presents with   Fall    HPI Mary [name] is a 38 y.o. female.     Patient presents to the emergency department for 2 separate problems.  Patient reports that she was stung multiple times on the right leg earlier today.  She initially had pain and swelling of the leg but this has improved.  She did administer her EpiPen but did not have any signs of anaphylaxis.  She has taken Benadryl and there are currently no symptoms.  A separate complaint is bilateral knee pain after a fall.  She lost her balance and fell down steps at home today.  This was not related to the bee stings, there was no dizziness, passing out, etc.  She is able to ambulate but both of her knees hurt.  She reports that when she bends and then extends her knee both are clicking, but this was not present before the fall.  She did not hit her head, no loss of consciousness, no headache, no neck pain, no back pain.     Past Medical History:  Diagnosis Date   Anxiety    Asthma    Bipolar 1 disorder ([place])    Bulging lumbar disc    BV (bacterial vaginosis) 02/18/2018   Cancer of cervix (HCC)    Cervical cancer (HCC)    Chronic back pain    COPD (chronic obstructive pulmonary disease) (HCC)    Depression    Hypertension    MS (multiple sclerosis) (HCC)    Physiological ovarian cysts    PTSD (post-traumatic stress disorder)    S/P partial hysterectomy     Patient Active Problem List   Diagnosis Date Noted   BV (bacterial vaginosis) 02/18/2018   Screening examination for STD (sexually transmitted disease) 02/06/2018   Family planning 02/06/2018   History of cervical cancer 02/06/2018   Vaginal Pap smear following hysterectomy for malignancy 02/06/2018   Encounter for well woman exam with routine gynecological exam 02/06/2018    Depression 02/06/2018   Tenderness of female pelvic organs 02/06/2018   Cancer of cervix ([place])    Right ovarian cyst 03/19/2014   Trichimoniasis 03/19/2014   DYSPNEA 11/26/2009   ASTHMA 11/23/2009    Past Surgical History:  Procedure Laterality Date   ABDOMINAL HYSTERECTOMY     EYE SURGERY     Artificial right eye   L ovarian removal Left      OB History    Gravida  3   Para  3   Term  3   Preterm      AB      Living  3     SAB      TAB      Ectopic      Multiple      Live Births               Home Medications    Prior to Admission medications   Medication Sig Start Date End Date Taking? Authorizing Provider  ALPRAZolam Duanne Moron) 0.5 MG tablet Take 0.5 mg by mouth at bedtime.     [provider]  diazepam (VALIUM) 10 MG tablet Take 1 tablet (10 mg total) by mouth every 8 (eight) hours as needed (Muscle spasm). Patient taking differently: Take 10 mg by mouth 2 (two) times daily as needed for  anxiety.  08/18/15   Daleen Bo, MD  Fluticasone-Salmeterol (ADVAIR) 250-50 MCG/DOSE AEPB Inhale 1 puff into the lungs 2 (two) times daily.    [provider]  furosemide (LASIX) 20 MG tablet Take 40 mg by mouth daily.     [provider]  lisinopril (PRINIVIL,ZESTRIL) 20 MG tablet Take 20 mg by mouth daily.    [provider]  ondansetron (ZOFRAN) 4 MG tablet Take 1 tablet (4 mg total) by mouth every 8 (eight) hours as needed for nausea or vomiting. 05/05/18   Idol, Almyra Free, PA-C  Oxycodone HCl 10 MG TABS Take 10 mg by mouth 4 (four) times daily. 03/14/18   [provider]  phenazopyridine (PYRIDIUM) 200 MG tablet Take 1 tablet (200 mg total) by mouth 3 (three) times daily. 10/20/18   Rolland Porter, MD  predniSONE (DELTASONE) 20 MG tablet Take 3 po QD x 3d , then 2 po QD x 3d then 1 po QD x 3d 09/15/18   Rolland Porter, MD  sulfamethoxazole-trimethoprim (BACTRIM DS,SEPTRA DS) 800-160 MG tablet Take 1 tablet by mouth 2 (two)  times daily. 10/20/18   Rolland Porter, MD    Family History Family History  Problem Relation Age of Onset   Stroke Mother        TIA   Stroke Father    Hypertension Brother    Asthma Daughter    Sleep apnea Son    Diabetes Maternal Grandmother    Cancer Paternal Grandmother    Cancer Brother        lung and thyroid    Social History Social History   Tobacco Use   Smoking status: Never Smoker   Smokeless tobacco: Never Used  Substance Use Topics   Alcohol use: No   Drug use: No     Allergies   Buprenorphine hcl, Morphine, Morphine and related, Penicillins, Gabapentin, and Latex   Review of Systems Review of Systems  Musculoskeletal: Positive for arthralgias.  Neurological: Negative for syncope.     Physical Exam Updated Vital Signs BP (!) 150/98    Pulse 72    Temp 98.3 F (36.8 C) (Oral)    Resp 16    SpO2 98%   Physical Exam Vitals signs and nursing note reviewed.  Constitutional:      General: She is not in acute distress.    Appearance: Normal appearance. She is well-developed.  HENT:     Head: Normocephalic and atraumatic.     Right Ear: Hearing normal.     Left Ear: Hearing normal.     Nose: Nose normal.  Eyes:     Conjunctiva/sclera: Conjunctivae normal.     Pupils: Pupils are equal, round, and reactive to light.  Neck:     Musculoskeletal: Normal range of motion and neck supple.  Cardiovascular:     Rate and Rhythm: Regular rhythm.     Heart sounds: S1 normal and S2 normal. No murmur. No friction rub. No gallop.   Pulmonary:     Effort: Pulmonary effort is normal. No respiratory distress.     Breath sounds: Normal breath sounds.  Chest:     Chest wall: No tenderness.  Abdominal:     General: Bowel sounds are normal.     Palpations: Abdomen is soft.     Tenderness: There is no abdominal tenderness. There is no guarding or rebound. Negative signs include Murphy's sign and McBurney's sign.     Hernia: No hernia is present.    Musculoskeletal: Normal range  of motion.     Right knee: She exhibits normal range of motion (Does have full range of motion, there is a palpable click present when she fully extends the knee), no swelling, no effusion, no ecchymosis, no deformity, normal alignment, no LCL laxity, normal patellar mobility and no MCL laxity. Tenderness found.     Left knee: She exhibits normal range of motion (Does have full range of motion, there is a palpable click present when she fully extends the knee), no swelling, no effusion, no ecchymosis, no deformity, normal alignment, no LCL laxity, normal patellar mobility and no MCL laxity. Tenderness found.  Skin:    General: Skin is warm and dry.     Findings: No rash.     Comments: Several faint slightly erythematous, well-circumscribed areas on right lower leg at the site of wasp stings.  Neurological:     Mental Status: She is alert and oriented to person, place, and time.     GCS: GCS eye subscore is 4. GCS verbal subscore is 5. GCS motor subscore is 6.     Cranial Nerves: No cranial nerve deficit.     Sensory: No sensory deficit.     Coordination: Coordination normal.  Psychiatric:        Speech: Speech normal.        Behavior: Behavior normal.        Thought Content: Thought content normal.      ED Treatments / Results  Labs (all labs ordered are listed, but only abnormal results are displayed) Labs Reviewed - No data to display  EKG None  Radiology Dg Knee Complete 4 Views Left  Result Date: 03/30/2019 CLINICAL DATA:  Recent fall downstairs with left knee pain, initial encounter EXAM: LEFT KNEE - COMPLETE 4+ VIEW COMPARISON:  None. FINDINGS: No evidence of fracture, dislocation, or joint effusion. No evidence of arthropathy or other focal bone abnormality. Soft tissues are unremarkable. IMPRESSION: No acute abnormality noted. Electronically Signed   By: Inez Catalina M.D.   On: 03/30/2019 00:49   Dg Knee Complete 4 Views Right  Result Date:  03/30/2019 CLINICAL DATA:  Fall downstairs with right knee pain, initial encounter EXAM: RIGHT KNEE - COMPLETE 4+ VIEW COMPARISON:  None. FINDINGS: No evidence of fracture, dislocation, or joint effusion. No evidence of arthropathy or other focal bone abnormality. Soft tissues are unremarkable. IMPRESSION: No acute abnormality noted. Electronically Signed   By: Inez Catalina M.D.   On: 03/30/2019 00:50    Procedures Procedures (including critical care time)  Medications Ordered in ED Medications - No data to display   Initial Impression / Assessment and Plan / ED Course  I have reviewed the triage vital signs and the nursing notes.  Pertinent labs & imaging results that were available during my care of the patient were reviewed by me and considered in my medical decision making (see chart for details).        Patient had multiple stings on her right lower leg earlier today.  She self administered epinephrine, has taken Benadryl.  No signs of anaphylaxis developed.  She does not have any sign of systemic reaction at this time, has very faint, resolving areas of local reaction.  Continue Benadryl.  Patient had a fall today as well.  She is complaining of bilateral knee pain.  Examination reveals pain with full extension with a palpable click in both knees that she says was not present prior to the fall.  There is no joint effusion or  deformity.  No evidence of patella tendon injury, no evidence of Achilles tendon injury.  X-rays are negative.  Follow-up with orthopedics.  Final Clinical Impressions(s) / ED Diagnoses   Final diagnoses:  Wasp sting, accidental or unintentional, initial encounter  Contusion of knee, unspecified laterality, initial encounter    ED Discharge Orders    None       Tavon Corriher, Gwenyth Allegra, MD 03/30/19 [phone]

## 2019-03-30 NOTE — ED Triage Notes (Signed)
Pt states that she fell down her steps, c/o pain to bilateral knees,

## 2019-08-21 ENCOUNTER — Other Ambulatory Visit: Payer: Self-pay

## 2019-08-21 ENCOUNTER — Encounter (HOSPITAL_COMMUNITY): Payer: Self-pay | Admitting: Emergency Medicine

## 2019-08-21 ENCOUNTER — Emergency Department (HOSPITAL_COMMUNITY)
Admission: EM | Admit: 2019-08-21 | Discharge: 2019-08-21 | Disposition: A | Discharge status: Home / Self Care | Payer: Medicaid Other | Attending: Emergency Medicine | Admitting: Emergency Medicine

## 2019-08-21 DIAGNOSIS — Z91040 Latex allergy status: Secondary | ICD-10-CM | POA: Insufficient documentation

## 2019-08-21 DIAGNOSIS — K0510 Chronic gingivitis, plaque induced: Secondary | ICD-10-CM | POA: Insufficient documentation

## 2019-08-21 DIAGNOSIS — J449 Chronic obstructive pulmonary disease, unspecified: Secondary | ICD-10-CM | POA: Insufficient documentation

## 2019-08-21 DIAGNOSIS — I10 Essential (primary) hypertension: Secondary | ICD-10-CM | POA: Insufficient documentation

## 2019-08-21 DIAGNOSIS — Z79899 Other long term (current) drug therapy: Secondary | ICD-10-CM | POA: Insufficient documentation

## 2019-08-21 MED ORDER — CHLORHEXIDINE GLUCONATE 0.12 % MT SOLN: 15.0000 mL | Freq: Two times a day (BID) | OROMUCOSAL | 0 refills | Status: DC

## 2019-08-21 MED ORDER — CHLORHEXIDINE GLUCONATE 0.12 % MT SOLN
15.0000 mL | Freq: Once | OROMUCOSAL | Status: AC
  Administered 2019-08-21: 15 mL via OROMUCOSAL
  Filled 2019-08-21 (×2): qty 15

## 2019-08-21 NOTE — ED Provider Notes (Signed)
[facility] EMERGENCY DEPARTMENT Provider Note   CSN: [id number] Arrival date & time: 08/21/19  0449     History Chief Complaint  Patient presents with  . Dental Pain    Mary [name] is a 38 y.o. female.  Patient presents to the emergency department with mouth closed She reports that she has been having pain, swelling with blisters of the gums for couple of days.  No new medications.  No other rash.  She has no dental pain, recently saw the dentist and had no problems.        Past Medical History:  Diagnosis Date  . Anxiety   . Asthma   . Bipolar 1 disorder ([place])   . Bulging lumbar disc   . BV (bacterial vaginosis) 02/18/2018  . Cancer of cervix ([place])   . Cervical cancer ([place])   . Chronic back pain   . COPD (chronic obstructive pulmonary disease) ([place])   . Depression   . Hypertension   . MS (multiple sclerosis) ([place])   . Physiological ovarian cysts   . PTSD (post-traumatic stress disorder)   . S/P partial hysterectomy     Patient Active Problem List   Diagnosis Date Noted  . BV (bacterial vaginosis) 02/18/2018  . Screening examination for STD (sexually transmitted disease) 02/06/2018  . Family planning 02/06/2018  . History of cervical cancer 02/06/2018  . Vaginal Pap smear following hysterectomy for malignancy 02/06/2018  . Encounter for well woman exam with routine gynecological exam 02/06/2018  . Depression 02/06/2018  . Tenderness of female pelvic organs 02/06/2018  . Cancer of cervix ([place])   . Right ovarian cyst 03/19/2014  . Trichimoniasis 03/19/2014  . DYSPNEA 11/26/2009  . ASTHMA 11/23/2009    Past Surgical History:  Procedure Laterality Date  . ABDOMINAL HYSTERECTOMY    . EYE SURGERY     Artificial right eye  . L ovarian removal Left      OB History    Gravida  3   Para  3   Term  3   Preterm      AB      Living  3     SAB      TAB      Ectopic      Multiple      Live Births              Family History    Problem Relation Age of Onset  . Stroke Mother        TIA  . Stroke Father   . Hypertension Brother   . Asthma Daughter   . Sleep apnea Son   . Diabetes Maternal Grandmother   . Cancer Paternal Grandmother   . Cancer Brother        lung and thyroid    Social History   Tobacco Use  . Smoking status: Never Smoker  . Smokeless tobacco: Never Used  Substance Use Topics  . Alcohol use: No  . Drug use: No    Home Medications Prior to Admission medications   Medication Sig Start Date End Date Taking? Authorizing Provider  ALPRAZolam Duanne Moron) 0.5 MG tablet Take 0.5 mg by mouth at bedtime.     [provider]  chlorhexidine (PERIDEX) 0.12 % solution Use as directed 15 mLs in the mouth or throat 2 (two) times daily. 08/21/19   Orpah Greek, MD  diazepam (VALIUM) 10 MG tablet Take 1 tablet (10 mg total) by mouth every 8 (eight) hours as  needed (Muscle spasm). Patient taking differently: Take 10 mg by mouth 2 (two) times daily as needed for anxiety.  08/18/15   Daleen Bo, MD  Fluticasone-Salmeterol (ADVAIR) 250-50 MCG/DOSE AEPB Inhale 1 puff into the lungs 2 (two) times daily.    [provider]  furosemide (LASIX) 20 MG tablet Take 40 mg by mouth daily.     [provider]  lisinopril (PRINIVIL,ZESTRIL) 20 MG tablet Take 20 mg by mouth daily.    [provider]  ondansetron (ZOFRAN) 4 MG tablet Take 1 tablet (4 mg total) by mouth every 8 (eight) hours as needed for nausea or vomiting. 05/05/18   Idol, Almyra Free, PA-C  Oxycodone HCl 10 MG TABS Take 10 mg by mouth 4 (four) times daily. 03/14/18   [provider]  phenazopyridine (PYRIDIUM) 200 MG tablet Take 1 tablet (200 mg total) by mouth 3 (three) times daily. 10/20/18   Rolland Porter, MD  predniSONE (DELTASONE) 20 MG tablet Take 3 po QD x 3d , then 2 po QD x 3d then 1 po QD x 3d 09/15/18   Rolland Porter, MD  sulfamethoxazole-trimethoprim (BACTRIM DS,SEPTRA DS) 800-160 MG tablet Take 1 tablet  by mouth 2 (two) times daily. 10/20/18   Rolland Porter, MD    Allergies    Buprenorphine hcl, Morphine, Morphine and related, Penicillins, Gabapentin, and Latex  Review of Systems   Review of Systems  HENT: Positive for mouth sores.   All other systems reviewed and are negative.   Physical Exam Updated Vital Signs BP (!) 145/111 (BP Location: Left Arm)   Pulse 65   Temp 98 F (36.7 C) (Oral)   Resp 18   SpO2 98%   Physical Exam Vitals and nursing note reviewed.  Constitutional:      General: She is not in acute distress.    Appearance: Normal appearance. She is well-developed.  HENT:     Head: Normocephalic and atraumatic.     Right Ear: Hearing normal.     Left Ear: Hearing normal.     Nose: Nose normal.     Mouth/Throat:     Dentition: Gingival swelling (With mild maceration) present.  Eyes:     Conjunctiva/sclera: Conjunctivae normal.     Pupils: Pupils are equal, round, and reactive to light.  Cardiovascular:     Rate and Rhythm: Regular rhythm.     Heart sounds: S1 normal and S2 normal. No murmur. No friction rub. No gallop.   Pulmonary:     Effort: Pulmonary effort is normal. No respiratory distress.     Breath sounds: Normal breath sounds.  Chest:     Chest wall: No tenderness.  Abdominal:     General: Bowel sounds are normal.     Palpations: Abdomen is soft.     Tenderness: There is no abdominal tenderness. There is no guarding or rebound. Negative signs include Murphy's sign and McBurney's sign.     Hernia: No hernia is present.  Musculoskeletal:        General: Normal range of motion.     Cervical back: Normal range of motion and neck supple.  Skin:    General: Skin is warm and dry.     Findings: No rash.  Neurological:     Mental Status: She is alert and oriented to person, place, and time.     GCS: GCS eye subscore is 4. GCS verbal subscore is 5. GCS motor subscore is 6.     Cranial Nerves: No cranial nerve  deficit.     Sensory: No sensory deficit.      Coordination: Coordination normal.  Psychiatric:        Speech: Speech normal.        Behavior: Behavior normal.        Thought Content: Thought content normal.     ED Results / Procedures / Treatments   Labs (all labs ordered are listed, but only abnormal results are displayed) Labs Reviewed - No data to display  EKG None  Radiology No results found.  Procedures Procedures (including critical care time)  Medications Ordered in ED Medications  chlorhexidine (PERIDEX) 0.12 % solution 15 mL (has no administration in time range)    ED Course  I have reviewed the triage vital signs and the nursing notes.  Pertinent labs & imaging results that were available during my care of the patient were reviewed by me and considered in my medical decision making (see chart for details).    MDM Rules/Calculators/A&P                       Final Clinical Impression(s) / ED Diagnoses Final diagnoses:  Gingivitis    Rx / DC Orders ED Discharge Orders         Ordered    chlorhexidine (PERIDEX) 0.12 % solution  2 times daily     08/21/19 0534           Orpah Greek, MD 08/21/19 [phone]

## 2019-08-21 NOTE — ED Triage Notes (Signed)
Pt C/O blisters in the upper and lower parts of her mouth.

## 2019-09-20 ENCOUNTER — Emergency Department (HOSPITAL_COMMUNITY)
Admission: EM | Admit: 2019-09-20 | Discharge: 2019-09-20 | Disposition: A | Discharge status: Home / Self Care | Payer: Medicaid Other | Attending: Emergency Medicine | Admitting: Emergency Medicine

## 2019-09-20 ENCOUNTER — Encounter (HOSPITAL_COMMUNITY): Payer: Self-pay

## 2019-09-20 ENCOUNTER — Other Ambulatory Visit: Payer: Self-pay

## 2019-09-20 DIAGNOSIS — G8929 Other chronic pain: Secondary | ICD-10-CM

## 2019-09-20 DIAGNOSIS — Z91040 Latex allergy status: Secondary | ICD-10-CM | POA: Insufficient documentation

## 2019-09-20 DIAGNOSIS — I10 Essential (primary) hypertension: Secondary | ICD-10-CM | POA: Insufficient documentation

## 2019-09-20 DIAGNOSIS — Z79899 Other long term (current) drug therapy: Secondary | ICD-10-CM | POA: Insufficient documentation

## 2019-09-20 DIAGNOSIS — J449 Chronic obstructive pulmonary disease, unspecified: Secondary | ICD-10-CM | POA: Insufficient documentation

## 2019-09-20 DIAGNOSIS — G5601 Carpal tunnel syndrome, right upper limb: Secondary | ICD-10-CM

## 2019-09-20 DIAGNOSIS — M25511 Pain in right shoulder: Secondary | ICD-10-CM | POA: Insufficient documentation

## 2019-09-20 MED ORDER — NAPROXEN 250 MG PO TABS
500.0000 mg | ORAL_TABLET | Freq: Once | ORAL | Status: AC
  Administered 2019-09-20: 04:00:00 500 mg via ORAL
  Filled 2019-09-20: qty 2

## 2019-09-20 MED ORDER — NAPROXEN 500 MG PO TABS: 500.0000 mg | ORAL_TABLET | Freq: Two times a day (BID) | ORAL | 0 refills | Status: DC

## 2019-09-20 NOTE — Discharge Instructions (Addendum)
Although your exam is strongly suggestive of carpal tunnel syndrome, there are other things that may be influencing your symptoms.  Please wear the wrist brace at all times for the next week (except when bathing).  After that, you may continue wearing it during the daytime as needed, but make sure to wear it at night when you are sleeping.  Do not take ibuprofen while you are taking naproxen.  Continue to take oxycodone as prescribed by your primary care provider.  Please make an appointment with the orthopedic specialist for further evaluation of your carpal tunnel syndrome and of your shoulder pain.

## 2019-09-20 NOTE — ED Triage Notes (Signed)
Pt reports onset of tingling starting in her right fingertips and going up her arm.  This started on Monday, and gotten worse since then.  Tingling/numbness is worse with use of the hand/wrist.

## 2019-09-20 NOTE — ED Provider Notes (Signed)
[facility] Provider Note   CSN: [id number] Arrival date & time: 09/20/19  0205   History Chief Complaint  Patient presents with  . arm tingling    Mary [name] is a 39 y.o. female.  The history is provided by the patient.  She has history of chronic back pain, hypertension, multiple sclerosis and has been having problems with numbness and pain in her right hand and arm for approximately the last year, but it is getting worse.  She tends to wake up with it feeling there is no circulation in her hand.  Pain is mainly around the thumb and index finger but will radiate all the way up her arm to her shoulder.  She has been taking oxycodone 10mg  for chronic back pain and this does seem to give slight relief to her arm pain.  She has also been taking ibuprofen intermittently which does not seem to give much relief.  She has noticed that she cannot open jars with her right hand and sometimes drops things from her hand.  She had suffered a crush injury to her right arm about a year ago.  Past Medical History:  Diagnosis Date  . Anxiety   . Asthma   . Bipolar 1 disorder ([place])   . Bulging lumbar disc   . BV (bacterial vaginosis) 02/18/2018  . Cancer of cervix ([place])   . Cervical cancer ([place])   . Chronic back pain   . COPD (chronic obstructive pulmonary disease) ([place])   . Depression   . Hypertension   . MS (multiple sclerosis) ([place])   . Physiological ovarian cysts   . PTSD (post-traumatic stress disorder)   . S/P partial hysterectomy     Patient Active Problem List   Diagnosis Date Noted  . BV (bacterial vaginosis) 02/18/2018  . Screening examination for STD (sexually transmitted disease) 02/06/2018  . Family planning 02/06/2018  . History of cervical cancer 02/06/2018  . Vaginal Pap smear following hysterectomy for malignancy 02/06/2018  . Encounter for well woman exam with routine gynecological exam 02/06/2018  . Depression 02/06/2018  . Tenderness of  female pelvic organs 02/06/2018  . Cancer of cervix ([place])   . Right ovarian cyst 03/19/2014  . Trichimoniasis 03/19/2014  . DYSPNEA 11/26/2009  . ASTHMA 11/23/2009    Past Surgical History:  Procedure Laterality Date  . ABDOMINAL HYSTERECTOMY    . EYE SURGERY     Artificial right eye  . L ovarian removal Left      OB History    Gravida  3   Para  3   Term  3   Preterm      AB      Living  3     SAB      TAB      Ectopic      Multiple      Live Births              Family History  Problem Relation Age of Onset  . Stroke Mother        TIA  . Stroke Father   . Hypertension Brother   . Asthma Daughter   . Sleep apnea Son   . Diabetes Maternal Grandmother   . Cancer Paternal Grandmother   . Cancer Brother        lung and thyroid    Social History   Tobacco Use  . Smoking status: Never Smoker  . Smokeless tobacco: Never Used  Substance Use  Topics  . Alcohol use: No  . Drug use: No    Home Medications Prior to Admission medications   Medication Sig Start Date End Date Taking? Authorizing Provider  ALPRAZolam Duanne Moron) 0.5 MG tablet Take 0.5 mg by mouth at bedtime.     [provider]  chlorhexidine (PERIDEX) 0.12 % solution Use as directed 15 mLs in the mouth or throat 2 (two) times daily. 08/21/19   Orpah Greek, MD  diazepam (VALIUM) 10 MG tablet Take 1 tablet (10 mg total) by mouth every 8 (eight) hours as needed (Muscle spasm). Patient taking differently: Take 10 mg by mouth 2 (two) times daily as needed for anxiety.  08/18/15   Daleen Bo, MD  Fluticasone-Salmeterol (ADVAIR) 250-50 MCG/DOSE AEPB Inhale 1 puff into the lungs 2 (two) times daily.    [provider]  furosemide (LASIX) 20 MG tablet Take 40 mg by mouth daily.     [provider]  lisinopril (PRINIVIL,ZESTRIL) 20 MG tablet Take 20 mg by mouth daily.    [provider]  naproxen (NAPROSYN) 500 MG tablet Take 1 tablet (500 mg total)  by mouth 2 (two) times daily. [id number]   Delora Fuel, MD  ondansetron (ZOFRAN) 4 MG tablet Take 1 tablet (4 mg total) by mouth every 8 (eight) hours as needed for nausea or vomiting. 05/05/18   Idol, Almyra Free, PA-C  Oxycodone HCl 10 MG TABS Take 10 mg by mouth 4 (four) times daily. 03/14/18   [provider]    Allergies    Buprenorphine hcl, Morphine, Morphine and related, Penicillins, Gabapentin, and Latex  Review of Systems   Review of Systems  All other systems reviewed and are negative.   Physical Exam Updated Vital Signs BP 109/78 (BP Location: Left Arm)   Pulse 81   Temp 98.1 F (36.7 C) (Oral)   Resp 20   Ht 5\' 9"  (1.753 m)   Wt 90.7 kg   SpO2 99%   BMI 29.53 kg/m   Physical Exam Vitals and nursing note reviewed.   39 year old female, resting comfortably and in no acute distress. Vital signs are normal. Oxygen saturation is 99%, which is normal. Head is normocephalic and atraumatic. PERRLA, EOMI. Oropharynx is clear.  Her right eye does not blink. Neck is nontender and supple without adenopathy or JVD. Back is nontender and there is no CVA tenderness. Lungs are clear without rales, wheezes, or rhonchi. Chest is nontender. Heart has regular rate and rhythm without murmur. Abdomen is soft, flat, nontender without masses or hepatosplenomegaly and peristalsis is normoactive. Extremities: No swelling or deformity seen.  There is marked limitation in range of motion of the right shoulder.  Pain is elicited with passive extension of the right wrist.  There is slight wasting of the thenar muscles on the right. Skin is warm and dry without rash. Neurologic: Mental status is normal, cranial nerves are intact.  There is mild to moderate weakness of pincer grasp on the right there is decreased sensation over the right thumb, index finger middle finger, as well as diffusely throughout the right forearm..  ED Results / Procedures / Treatments     Procedures Procedures  Medications Ordered in ED Medications  naproxen (NAPROSYN) tablet 500 mg (has no administration in time range)    ED Course  I have reviewed the triage vital signs and the nursing notes.  MDM Rules/Calculators/A&P Right arm and hand numbness and tingling most consistent with carpal tunnel syndrome, although cannot rule  out lesions involving other nerves going to the right hand.  At this point, I do not see indication for imaging.  She is given a cock-up wrist splint to wear constantly for the next week, then when sleeping and as needed.  She is given a prescription for naproxen and advised to discontinue ibuprofen.  Continue the rest of her chronic pain medication is.  She is to follow-up with her PCP for chronic pain management and is referred to orthopedics for further evaluation.  Old records were reviewed confirming ED visit 1 year ago for crush injury of right arm.  Final Clinical Impression(s) / ED Diagnoses Final diagnoses:  Carpal tunnel syndrome on right  Chronic right shoulder pain    Rx / DC Orders ED Discharge Orders         Ordered    naproxen (NAPROSYN) 500 MG tablet  2 times daily     09/20/19 [id number]           Delora Fuel, MD [id number]

## 2019-10-13 ENCOUNTER — Ambulatory Visit: Payer: Self-pay | Admitting: Physician Assistant

## 2019-10-16 ENCOUNTER — Ambulatory Visit: Payer: Medicaid Other | Admitting: Physician Assistant

## 2019-10-16 ENCOUNTER — Encounter: Payer: Self-pay | Admitting: Physician Assistant

## 2019-10-16 ENCOUNTER — Other Ambulatory Visit: Payer: Self-pay

## 2019-10-16 VITALS — BP 110/74 | HR 55 | Temp 97.7°F | Wt 322.4 lb

## 2019-10-16 DIAGNOSIS — Z13220 Encounter for screening for lipoid disorders: Secondary | ICD-10-CM

## 2019-10-16 DIAGNOSIS — R9389 Abnormal findings on diagnostic imaging of other specified body structures: Secondary | ICD-10-CM

## 2019-10-16 DIAGNOSIS — G35 Multiple sclerosis: Secondary | ICD-10-CM

## 2019-10-16 DIAGNOSIS — Z7689 Persons encountering health services in other specified circumstances: Secondary | ICD-10-CM

## 2019-10-16 DIAGNOSIS — Z8669 Personal history of other diseases of the nervous system and sense organs: Secondary | ICD-10-CM

## 2019-10-16 DIAGNOSIS — Z131 Encounter for screening for diabetes mellitus: Secondary | ICD-10-CM

## 2019-10-16 DIAGNOSIS — I10 Essential (primary) hypertension: Secondary | ICD-10-CM

## 2019-10-16 DIAGNOSIS — R911 Solitary pulmonary nodule: Secondary | ICD-10-CM

## 2019-10-16 DIAGNOSIS — R531 Weakness: Secondary | ICD-10-CM

## 2019-10-16 DIAGNOSIS — F489 Nonpsychotic mental disorder, unspecified: Secondary | ICD-10-CM

## 2019-10-16 DIAGNOSIS — G8929 Other chronic pain: Secondary | ICD-10-CM

## 2019-10-16 DIAGNOSIS — R0602 Shortness of breath: Secondary | ICD-10-CM

## 2019-10-16 MED ORDER — LOSARTAN POTASSIUM 50 MG PO TABS: 50.0000 mg | ORAL_TABLET | Freq: Every day | ORAL | 1 refills | Status: DC

## 2019-10-16 MED ORDER — DULERA 100-5 MCG/ACT IN AERO: 2.0000 | INHALATION_SPRAY | Freq: Two times a day (BID) | RESPIRATORY_TRACT | 1 refills | Status: DC

## 2019-10-16 MED ORDER — ALBUTEROL SULFATE HFA 108 (90 BASE) MCG/ACT IN AERS: 2.0000 | INHALATION_SPRAY | Freq: Four times a day (QID) | RESPIRATORY_TRACT | 1 refills | Status: DC | PRN

## 2019-10-16 NOTE — Patient Instructions (Signed)
Obesity, Adult Obesity is the condition of having too much total body fat. Being overweight or obese means that your weight is greater than what is considered healthy for your body size. Obesity is determined by a measurement called BMI. BMI is an estimate of body fat and is calculated from height and weight. For adults, a BMI of 30 or higher is considered obese. Obesity can lead to other health concerns and major illnesses, including:  Stroke.  Coronary artery disease (CAD).  Type 2 diabetes.  Some types of cancer, including cancers of the colon, breast, uterus, and gallbladder.  Osteoarthritis.  High blood pressure (hypertension).  High cholesterol.  Sleep apnea.  Gallbladder stones.  Infertility problems. What are the causes? Common causes of this condition include:  Eating daily meals that are high in calories, sugar, and fat.  Being born with genes that may make you more likely to become obese.  Having a medical condition that causes obesity, including: ? Hypothyroidism. ? Polycystic ovarian syndrome (PCOS). ? Binge-eating disorder. ? Cushing syndrome.  Taking certain medicines, such as steroids, antidepressants, and seizure medicines.  Not being physically active (sedentary lifestyle).  Not getting enough sleep.  Drinking high amounts of sugar-sweetened beverages, such as soft drinks. What increases the risk? The following factors may make you more likely to develop this condition:  Having a family history of obesity.  Being a woman of African American descent.  Being a man of Hispanic descent.  Living in an area with limited access to: ? [place], recreation centers, or sidewalks. ? Healthy food choices, such as grocery stores and farmers' markets. What are the signs or symptoms? The main sign of this condition is having too much body fat. How is this diagnosed? This condition is diagnosed based on:  Your BMI. If you are an adult with a BMI of 30 or  higher, you are considered obese.  Your waist circumference. This measures the distance around your waistline.  Your skinfold thickness. Your health care provider may gently pinch a fold of your skin and measure it. You may have other tests to check for underlying conditions. How is this treated? Treatment for this condition often includes changing your lifestyle. Treatment may include some or all of the following:  Dietary changes. This may include developing a healthy meal plan.  Regular physical activity. This may include activity that causes your heart to beat faster (aerobic exercise) and strength training. Work with your health care provider to design an exercise program that works for you.  Medicine to help you lose weight if you are unable to lose 1 pound a week after 6 weeks of healthy eating and more physical activity.  Treating conditions that cause the obesity (underlying conditions).  Surgery. Surgical options may include gastric banding and gastric bypass. Surgery may be done if: ? Other treatments have not helped to improve your condition. ? You have a BMI of 40 or higher. ? You have life-threatening health problems related to obesity. Follow these instructions at home: Eating and drinking   Follow recommendations from your health care provider about what you eat and drink. Your health care provider may advise you to: ? Limit fast food, sweets, and processed snack foods. ? Choose low-fat options, such as low-fat milk instead of whole milk. ? Eat 5 or more servings of fruits or vegetables every day. ? Eat at home more often. This gives you more control over what you eat. ? Choose healthy foods when you eat out. ?  Learn to read food labels. This will help you understand how much food is considered 1 serving. ? Learn what a healthy serving size is. ? Keep low-fat snacks available. ? Limit sugary drinks, such as soda, fruit juice, sweetened iced tea, and flavored  milk.  Drink enough water to keep your urine pale yellow.  Do not follow a fad diet. Fad diets can be unhealthy and even dangerous. Physical activity  Exercise regularly, as told by your health care provider. ? Most adults should get up to 150 minutes of moderate-intensity exercise every week. ? Ask your health care provider what types of exercise are safe for you and how often you should exercise.  Warm up and stretch before being active.  Cool down and stretch after being active.  Rest between periods of activity. Lifestyle  Work with your health care provider and a dietitian to set a weight-loss goal that is healthy and reasonable for you.  Limit your screen time.  Find ways to reward yourself that do not involve food.  Do not drink alcohol if: ? Your health care provider tells you not to drink. ? You are pregnant, may be pregnant, or are planning to become pregnant.  If you drink alcohol: ? Limit how much you use to:  0-1 drink a day for women.  0-2 drinks a day for men. ? Be aware of how much alcohol is in your drink. In the [place] one drink equals one 12 oz bottle of beer (355 mL), one 5 oz glass of wine (148 mL), or one 1 oz glass of hard liquor (44 mL). General instructions  Keep a weight-loss journal to keep track of the food you eat and how much exercise you get.  Take over-the-counter and prescription medicines only as told by your health care provider.  Take vitamins and supplements only as told by your health care provider.  Consider joining a support group. Your health care provider may be able to recommend a support group.  Keep all follow-up visits as told by your health care provider. This is important. Contact a health care provider if:  You are unable to meet your weight loss goal after 6 weeks of dietary and lifestyle changes. Get help right away if you are having:  Trouble breathing.  Suicidal thoughts or behaviors. Summary  Obesity is the  condition of having too much total body fat.  Being overweight or obese means that your weight is greater than what is considered healthy for your body size.  Work with your health care provider and a dietitian to set a weight-loss goal that is healthy and reasonable for you.  Exercise regularly, as told by your health care provider. Ask your health care provider what types of exercise are safe for you and how often you should exercise. This information is not intended to replace advice given to you by your health care provider. Make sure you discuss any questions you have with your health care provider. Document Revised: 04/11/2018 Document Reviewed: 04/11/2018 Elsevier Patient Education  2020 Elsevier Inc.  
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
## 2019-10-17 ENCOUNTER — Other Ambulatory Visit (HOSPITAL_COMMUNITY)
Admission: RE | Admit: 2019-10-17 | Discharge: 2019-10-17 | Disposition: A | Discharge status: Home / Self Care | Payer: Medicaid Other | Source: Ambulatory Visit | Attending: Physician Assistant | Admitting: Physician Assistant

## 2019-10-17 DIAGNOSIS — R531 Weakness: Secondary | ICD-10-CM | POA: Insufficient documentation

## 2019-10-17 DIAGNOSIS — Z131 Encounter for screening for diabetes mellitus: Secondary | ICD-10-CM | POA: Insufficient documentation

## 2019-10-17 DIAGNOSIS — Z13220 Encounter for screening for lipoid disorders: Secondary | ICD-10-CM | POA: Insufficient documentation

## 2019-10-17 DIAGNOSIS — I10 Essential (primary) hypertension: Secondary | ICD-10-CM | POA: Insufficient documentation

## 2019-10-17 LAB — LIPID PANEL
Cholesterol: 145 mg/dL (ref 0–200)
HDL: 52 mg/dL (ref >40)
LDL Cholesterol: 83 mg/dL (ref 0–99)
Total CHOL/HDL Ratio: 2.8 RATIO
Triglycerides: 50 mg/dL (ref <150)
VLDL: 10 mg/dL (ref 0–40)

## 2019-10-17 LAB — CBC WITH DIFFERENTIAL/PLATELET
Abs Immature Granulocytes: 0.01 10*3/uL (ref 0.00–0.07)
Basophils Absolute: 0 10*3/uL (ref 0.0–0.1)
Basophils Relative: 0 %
Eosinophils Absolute: 0.1 10*3/uL (ref 0.0–0.5)
Eosinophils Relative: 1 %
HCT: 35.5 % — ABNORMAL LOW (ref 36.0–46.0)
Hemoglobin: 11.8 g/dL — ABNORMAL LOW (ref 12.0–15.0)
Immature Granulocytes: 0 %
Lymphocytes Relative: 29 %
Lymphs Abs: 1.8 10*3/uL (ref 0.7–4.0)
MCH: 29.6 pg (ref 26.0–34.0)
MCHC: 33.2 g/dL (ref 30.0–36.0)
MCV: 89.2 fL (ref 80.0–100.0)
Monocytes Absolute: 0.3 10*3/uL (ref 0.1–1.0)
Monocytes Relative: 5 %
Neutro Abs: 4 10*3/uL (ref 1.7–7.7)
Neutrophils Relative %: 65 %
Platelets: 212 10*3/uL (ref 150–400)
RBC: 3.98 MIL/uL (ref 3.87–5.11)
RDW: 13.2 % (ref 11.5–15.5)
WBC: 6.2 10*3/uL (ref 4.0–10.5)
nRBC: 0 % (ref 0.0–0.2)

## 2019-10-17 LAB — HEMOGLOBIN A1C
Hgb A1c MFr Bld: 4.7 % — ABNORMAL LOW (ref 4.8–5.6)
Mean Plasma Glucose: 88.19 mg/dL

## 2019-10-17 LAB — COMPREHENSIVE METABOLIC PANEL
ALT: 22 U/L (ref 0–44)
AST: 18 U/L (ref 15–41)
Albumin: 4 g/dL (ref 3.5–5.0)
Alkaline Phosphatase: 84 U/L (ref 38–126)
Anion gap: 9 (ref 5–15)
BUN: 14 mg/dL (ref 6–20)
CO2: 27 mmol/L (ref 22–32)
Calcium: 9 mg/dL (ref 8.9–10.3)
Chloride: 105 mmol/L (ref 98–111)
Creatinine, Ser: 1 mg/dL (ref 0.44–1.00)
GFR calc Af Amer: >60 mL/min (ref >60)
GFR calc non Af Amer: >60 mL/min (ref >60)
Glucose, Bld: 95 mg/dL (ref 70–99)
Potassium: 3.9 mmol/L (ref 3.5–5.1)
Sodium: 141 mmol/L (ref 135–145)
Total Bilirubin: 0.8 mg/dL (ref 0.3–1.2)
Total Protein: 7 g/dL (ref 6.5–8.1)

## 2019-10-17 LAB — TSH: TSH: 2.174 u[IU]/mL (ref 0.350–4.500)

## 2019-10-23 ENCOUNTER — Other Ambulatory Visit: Payer: Self-pay | Admitting: Student

## 2019-10-23 DIAGNOSIS — R9389 Abnormal findings on diagnostic imaging of other specified body structures: Secondary | ICD-10-CM

## 2019-10-23 DIAGNOSIS — R911 Solitary pulmonary nodule: Secondary | ICD-10-CM

## 2019-10-28 ENCOUNTER — Ambulatory Visit (HOSPITAL_COMMUNITY)
Admission: RE | Admit: 2019-10-28 | Discharge: 2019-10-28 | Disposition: A | Discharge status: Home / Self Care | Payer: Self-pay | Source: Ambulatory Visit | Attending: Physician Assistant | Admitting: Physician Assistant

## 2019-10-28 ENCOUNTER — Other Ambulatory Visit: Payer: Self-pay

## 2019-10-28 DIAGNOSIS — R911 Solitary pulmonary nodule: Secondary | ICD-10-CM | POA: Insufficient documentation

## 2019-10-28 DIAGNOSIS — R9389 Abnormal findings on diagnostic imaging of other specified body structures: Secondary | ICD-10-CM | POA: Insufficient documentation

## 2019-10-30 ENCOUNTER — Other Ambulatory Visit: Payer: Self-pay

## 2019-10-30 ENCOUNTER — Emergency Department (HOSPITAL_COMMUNITY)
Admission: EM | Admit: 2019-10-30 | Discharge: 2019-10-30 | Disposition: A | Discharge status: Home / Self Care | Payer: Self-pay | Attending: Emergency Medicine | Admitting: Emergency Medicine

## 2019-10-30 ENCOUNTER — Encounter (HOSPITAL_COMMUNITY): Payer: Self-pay | Admitting: Emergency Medicine

## 2019-10-30 ENCOUNTER — Emergency Department (HOSPITAL_COMMUNITY): Payer: Self-pay

## 2019-10-30 DIAGNOSIS — J449 Chronic obstructive pulmonary disease, unspecified: Secondary | ICD-10-CM | POA: Insufficient documentation

## 2019-10-30 DIAGNOSIS — Z7982 Long term (current) use of aspirin: Secondary | ICD-10-CM | POA: Insufficient documentation

## 2019-10-30 DIAGNOSIS — I10 Essential (primary) hypertension: Secondary | ICD-10-CM | POA: Insufficient documentation

## 2019-10-30 DIAGNOSIS — Z8541 Personal history of malignant neoplasm of cervix uteri: Secondary | ICD-10-CM | POA: Insufficient documentation

## 2019-10-30 DIAGNOSIS — Z79899 Other long term (current) drug therapy: Secondary | ICD-10-CM | POA: Insufficient documentation

## 2019-10-30 DIAGNOSIS — M5441 Lumbago with sciatica, right side: Secondary | ICD-10-CM | POA: Insufficient documentation

## 2019-10-30 DIAGNOSIS — Z91040 Latex allergy status: Secondary | ICD-10-CM | POA: Insufficient documentation

## 2019-10-30 MED ORDER — NAPROXEN 500 MG PO TABS: 500.0000 mg | ORAL_TABLET | Freq: Two times a day (BID) | ORAL | 0 refills | Status: DC

## 2019-10-30 MED ORDER — OXYCODONE-ACETAMINOPHEN 5-325 MG PO TABS
2.0000 | ORAL_TABLET | Freq: Once | ORAL | Status: AC
  Administered 2019-10-30: 2 via ORAL
  Filled 2019-10-30: qty 2

## 2019-10-30 MED ORDER — METHOCARBAMOL 500 MG PO TABS: 500.0000 mg | ORAL_TABLET | Freq: Three times a day (TID) | ORAL | 0 refills | Status: DC

## 2019-10-30 MED ORDER — CYCLOBENZAPRINE HCL 10 MG PO TABS
10.0000 mg | ORAL_TABLET | Freq: Once | ORAL | Status: AC
  Administered 2019-10-30: 10 mg via ORAL
  Filled 2019-10-30: qty 1

## 2019-10-30 NOTE — Discharge Instructions (Addendum)
Follow-up with your primary provider next week for recheck.
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
## 2019-10-30 NOTE — ED Triage Notes (Signed)
Pt c/o lower back pain since last night. Pt has hx of same which she takes pain medication for but it has not helped.

## 2019-11-15 ENCOUNTER — Emergency Department (HOSPITAL_COMMUNITY): Payer: Medicaid Other

## 2019-11-15 ENCOUNTER — Other Ambulatory Visit: Payer: Self-pay

## 2019-11-15 ENCOUNTER — Encounter (HOSPITAL_COMMUNITY): Payer: Self-pay

## 2019-11-15 ENCOUNTER — Emergency Department (HOSPITAL_COMMUNITY)
Admission: EM | Admit: 2019-11-15 | Discharge: 2019-11-15 | Disposition: A | Discharge status: Home / Self Care | Payer: Medicaid Other | Attending: Emergency Medicine | Admitting: Emergency Medicine

## 2019-11-15 DIAGNOSIS — G35 Multiple sclerosis: Secondary | ICD-10-CM | POA: Insufficient documentation

## 2019-11-15 DIAGNOSIS — Y92481 Parking lot as the place of occurrence of the external cause: Secondary | ICD-10-CM | POA: Insufficient documentation

## 2019-11-15 DIAGNOSIS — Z79899 Other long term (current) drug therapy: Secondary | ICD-10-CM | POA: Insufficient documentation

## 2019-11-15 DIAGNOSIS — J449 Chronic obstructive pulmonary disease, unspecified: Secondary | ICD-10-CM | POA: Insufficient documentation

## 2019-11-15 DIAGNOSIS — Z7982 Long term (current) use of aspirin: Secondary | ICD-10-CM | POA: Insufficient documentation

## 2019-11-15 DIAGNOSIS — S42452A Displaced fracture of lateral condyle of left humerus, initial encounter for closed fracture: Secondary | ICD-10-CM | POA: Insufficient documentation

## 2019-11-15 DIAGNOSIS — Y9301 Activity, walking, marching and hiking: Secondary | ICD-10-CM | POA: Insufficient documentation

## 2019-11-15 DIAGNOSIS — W101XXA Fall (on)(from) sidewalk curb, initial encounter: Secondary | ICD-10-CM | POA: Insufficient documentation

## 2019-11-15 DIAGNOSIS — Y999 Unspecified external cause status: Secondary | ICD-10-CM | POA: Insufficient documentation

## 2019-11-15 DIAGNOSIS — Z91040 Latex allergy status: Secondary | ICD-10-CM | POA: Insufficient documentation

## 2019-11-15 LAB — CBG MONITORING, ED: Glucose-Capillary: 330 mg/dL — ABNORMAL HIGH (ref 70–99)
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
## 2019-11-15 NOTE — ED Notes (Signed)
To radiology

## 2019-11-15 NOTE — Discharge Instructions (Addendum)
Tylenol or Ibuprofen for pain - ice and elevate Call the orthopedic doctor for follow up - Dr. Amedeo Plenty phone number is above  You should be seen early this week - you may need surgery to fix this  Take your home medicines as prescribed including the pain medicine you are already prescribed.

## 2019-11-15 NOTE — ED Notes (Signed)
Pt is discharged , will await C/T for actual departure.

## 2019-11-17 ENCOUNTER — Other Ambulatory Visit (HOSPITAL_COMMUNITY)
Admission: RE | Admit: 2019-11-17 | Discharge: 2019-11-17 | Disposition: A | Discharge status: Home / Self Care | Payer: Medicaid Other | Source: Ambulatory Visit | Attending: Orthopedic Surgery | Admitting: Orthopedic Surgery

## 2019-11-17 ENCOUNTER — Encounter (HOSPITAL_COMMUNITY): Payer: Self-pay | Admitting: Orthopedic Surgery

## 2019-11-17 ENCOUNTER — Other Ambulatory Visit: Payer: Self-pay

## 2019-11-17 DIAGNOSIS — Z01812 Encounter for preprocedural laboratory examination: Secondary | ICD-10-CM | POA: Insufficient documentation

## 2019-11-17 DIAGNOSIS — Z20822 Contact with and (suspected) exposure to covid-19: Secondary | ICD-10-CM | POA: Diagnosis not present

## 2019-11-17 LAB — SARS CORONAVIRUS 2 (TAT 6-24 HRS): SARS Coronavirus 2: NEGATIVE
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
## 2019-11-17 NOTE — Progress Notes (Addendum)
[id number] [place], [place] [address] [place] [address] Phone: [phone] Fax: [phone]  [facility] [address] - [place], [place] - [place]. [address] [place] [address] Phone: [phone] Fax: [phone]  [facility], [place], [place] [address], [place] [address] Phone: [phone] Fax: [phone] ---SDW---  PCP - Soyla Dryer [place] Cardiologist - denies - per patient PCP "just set up appointment to visit one due to enlarged heart and irregular heart rhythm"  PPM/ICD - denies  Chest x-ray - N/A EKG -  DOS Stress Test - 2011 ECHO - denies Cardiac Cath - denies  Sleep Study - denies CPAP - N/A  Blood Thinner Instructions: N/A Aspirin Instructions: per patient "none given" yesterday 11/16/2019 was last dose  COVID TEST-  Test results pending 11/17/2019  Anesthesia review: YES  Patient denies shortness of breath, fever, cough and chest pain on pre-op phone call  Patient also instructed to self quarantine after being tested for COVID-19. The opportunity to ask questions was provided.   Your procedure is scheduled on Tuesday, March 30th.  Report to [facility] Entrance "A" at 12:30 P.M., and check in at the Admitting office.  Call this number if you have problems the morning of surgery:  [phone]  Call [phone] if you have any questions prior to your surgery date Monday-Friday 8am-4pm   Remember:  Do not eat or drink after midnight the night before your surgery    Take these medicines the morning of surgery with A SIP OF WATER  methocarbamol (ROBAXIN) mometasone-formoterol (DULERA)   If needed - albuterol (VENTOLIN HFA),  ondansetron (ZOFRAN), oxycodone (OXY-IR), SUMAtriptan (IMITREX)       Follow your surgeon's instructions on when to stop Aspirin.   If no instructions were given by your surgeon then you will need to call the office to get those instructions.     As of today, stop taking all Aspirin (unless instructed by your doctor) and other Aspirin containing products, Vitamins, Fish Oils, and Herbal Medications. Also stop all NSAIDS i.e. Advil, Ibuprofen, Motrin, Aleve, Anaprox, Naproxen, BC, Goody Powders, and all Supplements.   No Smoking of any kind, Tobacco, or Alcohol products 24 hours prior to your procedure. If you use a CPAP at night, you may bring all equipment for your overnight stay.                        Do not wear jewelry, make up, or nail polish            Do not wear lotions, powders, perfumes, or deodorant.            Do not shave 48 hours prior to surgery.              Do not bring valuables to the hospital.            [facility] is not responsible for any belongings or valuables.   Contacts, glasses, dentures or bridgework may not be worn into surgery.      For patients admitted to the hospital, discharge time will be determined by your treatment team.   Patients discharged the day of surgery will not be allowed to drive home, and someone needs to stay with them for 24 hours.  Special instructions:  [place]- Preparing For Surgery  Before surgery, you can play an important role. Because skin is not sterile, your skin needs to be as free of germs as possible. You can reduce the number of germs on your skin by washing with CHG (chlorahexidine gluconate) Soap before surgery.  CHG is an antiseptic cleaner which kills germs and bonds with the skin to continue killing germs even after washing.    Oral Hygiene is also important to reduce your risk of infection.  Remember - BRUSH YOUR TEETH THE MORNING OF SURGERY WITH YOUR REGULAR TOOTHPASTE  Please do not use if you have an allergy to CHG or antibacterial soaps. If your skin becomes reddened/irritated stop using the CHG.  Do not shave (including legs and  underarms) for at least 48 hours prior to first CHG shower. It is OK to shave your face.  Please follow these instructions carefully.   1. Shower the NIGHT BEFORE SURGERY and the MORNING OF SURGERY with CHG Soap.   2. If you chose to wash your hair, wash your hair first as usual with your normal shampoo.  3. After you shampoo, rinse your hair and body thoroughly to remove the shampoo.  4. Use CHG as you would any other liquid soap. You can apply CHG directly to the skin and wash gently with a scrungie or a clean washcloth.   5. Apply the CHG Soap to your body ONLY FROM THE NECK DOWN.  Do not use on open wounds or open sores. Avoid contact with your eyes, ears, mouth and genitals (private parts). Wash Face and genitals (private parts)  with your normal soap.   6. Wash thoroughly, paying special attention to the area where your surgery will be performed.  7. Thoroughly rinse your body with warm water from the neck down.  8. DO NOT shower/wash with your normal soap after using and rinsing off the CHG Soap.  9. Pat yourself dry with a CLEAN TOWEL.  10. Wear CLEAN PAJAMAS to bed the night before surgery, wear comfortable clothes the morning of surgery  11. Place CLEAN SHEETS on your bed the night of your first shower and DO NOT SLEEP WITH PETS.  Day of Surgery: Do not apply any deodorants/lotions.  Please wear clean clothes to the hospital/surgery center.   Remember to brush your teeth WITH YOUR REGULAR TOOTHPASTE.   Please read over the following fact sheets that you were given.

## 2019-11-18 ENCOUNTER — Ambulatory Visit (HOSPITAL_COMMUNITY)
Admission: RE | Admit: 2019-11-18 | Discharge: 2019-11-18 | Disposition: A | Discharge status: Home / Self Care | Payer: Self-pay | Attending: Orthopedic Surgery | Admitting: Orthopedic Surgery

## 2019-11-18 ENCOUNTER — Other Ambulatory Visit: Payer: Self-pay

## 2019-11-18 ENCOUNTER — Ambulatory Visit (HOSPITAL_COMMUNITY): Payer: Self-pay | Admitting: Vascular Surgery

## 2019-11-18 ENCOUNTER — Ambulatory Visit (HOSPITAL_COMMUNITY): Payer: Self-pay

## 2019-11-18 ENCOUNTER — Encounter (HOSPITAL_COMMUNITY)
Admission: RE | Disposition: A | Discharge status: Home / Self Care | Payer: Self-pay | Source: Home / Self Care | Attending: Orthopedic Surgery

## 2019-11-18 ENCOUNTER — Encounter (HOSPITAL_COMMUNITY): Payer: Self-pay | Admitting: Orthopedic Surgery

## 2019-11-18 DIAGNOSIS — Z7982 Long term (current) use of aspirin: Secondary | ICD-10-CM | POA: Insufficient documentation

## 2019-11-18 DIAGNOSIS — Z8249 Family history of ischemic heart disease and other diseases of the circulatory system: Secondary | ICD-10-CM | POA: Insufficient documentation

## 2019-11-18 DIAGNOSIS — S53442A Ulnar collateral ligament sprain of left elbow, initial encounter: Secondary | ICD-10-CM | POA: Insufficient documentation

## 2019-11-18 DIAGNOSIS — G35 Multiple sclerosis: Secondary | ICD-10-CM | POA: Insufficient documentation

## 2019-11-18 DIAGNOSIS — Z886 Allergy status to analgesic agent status: Secondary | ICD-10-CM | POA: Insufficient documentation

## 2019-11-18 DIAGNOSIS — F319 Bipolar disorder, unspecified: Secondary | ICD-10-CM | POA: Insufficient documentation

## 2019-11-18 DIAGNOSIS — Z803 Family history of malignant neoplasm of breast: Secondary | ICD-10-CM | POA: Insufficient documentation

## 2019-11-18 DIAGNOSIS — W19XXXA Unspecified fall, initial encounter: Secondary | ICD-10-CM | POA: Insufficient documentation

## 2019-11-18 DIAGNOSIS — Z836 Family history of other diseases of the respiratory system: Secondary | ICD-10-CM | POA: Insufficient documentation

## 2019-11-18 DIAGNOSIS — Z91040 Latex allergy status: Secondary | ICD-10-CM | POA: Insufficient documentation

## 2019-11-18 DIAGNOSIS — Z833 Family history of diabetes mellitus: Secondary | ICD-10-CM | POA: Insufficient documentation

## 2019-11-18 DIAGNOSIS — F4310 Post-traumatic stress disorder, unspecified: Secondary | ICD-10-CM | POA: Insufficient documentation

## 2019-11-18 DIAGNOSIS — Z791 Long term (current) use of non-steroidal anti-inflammatories (NSAID): Secondary | ICD-10-CM | POA: Insufficient documentation

## 2019-11-18 DIAGNOSIS — Z8049 Family history of malignant neoplasm of other genital organs: Secondary | ICD-10-CM | POA: Insufficient documentation

## 2019-11-18 DIAGNOSIS — I10 Essential (primary) hypertension: Secondary | ICD-10-CM | POA: Insufficient documentation

## 2019-11-18 DIAGNOSIS — Z9001 Acquired absence of eye: Secondary | ICD-10-CM | POA: Insufficient documentation

## 2019-11-18 DIAGNOSIS — G8929 Other chronic pain: Secondary | ICD-10-CM | POA: Insufficient documentation

## 2019-11-18 DIAGNOSIS — M069 Rheumatoid arthritis, unspecified: Secondary | ICD-10-CM | POA: Insufficient documentation

## 2019-11-18 DIAGNOSIS — Z79899 Other long term (current) drug therapy: Secondary | ICD-10-CM | POA: Insufficient documentation

## 2019-11-18 DIAGNOSIS — F419 Anxiety disorder, unspecified: Secondary | ICD-10-CM | POA: Insufficient documentation

## 2019-11-18 DIAGNOSIS — J449 Chronic obstructive pulmonary disease, unspecified: Secondary | ICD-10-CM | POA: Insufficient documentation

## 2019-11-18 DIAGNOSIS — Z880 Allergy status to penicillin: Secondary | ICD-10-CM | POA: Insufficient documentation

## 2019-11-18 DIAGNOSIS — E669 Obesity, unspecified: Secondary | ICD-10-CM | POA: Insufficient documentation

## 2019-11-18 DIAGNOSIS — Z808 Family history of malignant neoplasm of other organs or systems: Secondary | ICD-10-CM | POA: Insufficient documentation

## 2019-11-18 DIAGNOSIS — Z820 Family history of epilepsy and other diseases of the nervous system: Secondary | ICD-10-CM | POA: Insufficient documentation

## 2019-11-18 DIAGNOSIS — Z90710 Acquired absence of both cervix and uterus: Secondary | ICD-10-CM | POA: Insufficient documentation

## 2019-11-18 DIAGNOSIS — Z888 Allergy status to other drugs, medicaments and biological substances status: Secondary | ICD-10-CM | POA: Insufficient documentation

## 2019-11-18 DIAGNOSIS — Z6831 Body mass index (BMI) 31.0-31.9, adult: Secondary | ICD-10-CM | POA: Insufficient documentation

## 2019-11-18 DIAGNOSIS — Z823 Family history of stroke: Secondary | ICD-10-CM | POA: Insufficient documentation

## 2019-11-18 DIAGNOSIS — M549 Dorsalgia, unspecified: Secondary | ICD-10-CM | POA: Insufficient documentation

## 2019-11-18 DIAGNOSIS — Z8541 Personal history of malignant neoplasm of cervix uteri: Secondary | ICD-10-CM | POA: Insufficient documentation

## 2019-11-18 DIAGNOSIS — S42452A Displaced fracture of lateral condyle of left humerus, initial encounter for closed fracture: Secondary | ICD-10-CM | POA: Insufficient documentation

## 2019-11-18 DIAGNOSIS — Z7951 Long term (current) use of inhaled steroids: Secondary | ICD-10-CM | POA: Insufficient documentation

## 2019-11-18 DIAGNOSIS — G43909 Migraine, unspecified, not intractable, without status migrainosus: Secondary | ICD-10-CM | POA: Insufficient documentation

## 2019-11-18 DIAGNOSIS — Z885 Allergy status to narcotic agent status: Secondary | ICD-10-CM | POA: Insufficient documentation

## 2019-11-18 HISTORY — DX: Rheumatoid arthritis, unspecified: M06.9

## 2019-11-18 HISTORY — PX: ORIF ELBOW FRACTURE: SHX5031

## 2019-11-18 HISTORY — DX: Migraine, unspecified, not intractable, without status migrainosus: G43.909

## 2019-11-18 LAB — CBC
HCT: 38.3 % (ref 36.0–46.0)
Hemoglobin: 12.3 g/dL (ref 12.0–15.0)
MCH: 29.6 pg (ref 26.0–34.0)
MCHC: 32.1 g/dL (ref 30.0–36.0)
MCV: 92.1 fL (ref 80.0–100.0)
Platelets: 210 10*3/uL (ref 150–400)
RBC: 4.16 MIL/uL (ref 3.87–5.11)
RDW: 13.3 % (ref 11.5–15.5)
WBC: 7.8 10*3/uL (ref 4.0–10.5)
nRBC: 0 % (ref 0.0–0.2)

## 2019-11-18 LAB — BASIC METABOLIC PANEL
Anion gap: 10 (ref 5–15)
BUN: 10 mg/dL (ref 6–20)
CO2: 25 mmol/L (ref 22–32)
Calcium: 8.9 mg/dL (ref 8.9–10.3)
Chloride: 103 mmol/L (ref 98–111)
Creatinine, Ser: 1 mg/dL (ref 0.44–1.00)
GFR calc Af Amer: >60 mL/min (ref >60)
GFR calc non Af Amer: >60 mL/min (ref >60)
Glucose, Bld: 85 mg/dL (ref 70–99)
Potassium: 4 mmol/L (ref 3.5–5.1)
Sodium: 138 mmol/L (ref 135–145)

## 2019-11-18 SURGERY — OPEN REDUCTION INTERNAL FIXATION (ORIF) ELBOW/OLECRANON FRACTURE
Anesthesia: General | Site: Elbow | Laterality: Left

## 2019-11-18 MED ORDER — MEPERIDINE HCL 25 MG/ML IJ SOLN: 6.2500 mg | INTRAMUSCULAR | Status: DC | PRN

## 2019-11-18 MED ORDER — MIDAZOLAM HCL 5 MG/5ML IJ SOLN
INTRAMUSCULAR | Status: DC | PRN
  Administered 2019-11-18: 2 mg via INTRAVENOUS

## 2019-11-18 MED ORDER — ROCURONIUM BROMIDE 50 MG/5ML IV SOSY
PREFILLED_SYRINGE | INTRAVENOUS | Status: DC | PRN
  Administered 2019-11-18: 20 mg via INTRAVENOUS
  Administered 2019-11-18: 40 mg via INTRAVENOUS

## 2019-11-18 MED ORDER — LIDOCAINE 2% (20 MG/ML) 5 ML SYRINGE
INTRAMUSCULAR | Status: AC
  Filled 2019-11-18: qty 5

## 2019-11-18 MED ORDER — SUCCINYLCHOLINE CHLORIDE 200 MG/10ML IV SOSY
PREFILLED_SYRINGE | INTRAVENOUS | Status: DC | PRN
  Administered 2019-11-18: 120 mg via INTRAVENOUS

## 2019-11-18 MED ORDER — OXYCODONE HCL 5 MG/5ML PO SOLN: 5.0000 mg | Freq: Once | ORAL | Status: AC | PRN

## 2019-11-18 MED ORDER — 0.9 % SODIUM CHLORIDE (POUR BTL) OPTIME
TOPICAL | Status: DC | PRN
  Administered 2019-11-18: 1000 mL

## 2019-11-18 MED ORDER — OXYCODONE HCL 5 MG PO TABS
5.0000 mg | ORAL_TABLET | Freq: Once | ORAL | Status: AC | PRN
  Administered 2019-11-18: 5 mg via ORAL

## 2019-11-18 MED ORDER — HYDROMORPHONE HCL 1 MG/ML IJ SOLN
INTRAMUSCULAR | Status: AC
  Filled 2019-11-18: qty 1

## 2019-11-18 MED ORDER — BUPIVACAINE HCL (PF) 0.25 % IJ SOLN
INTRAMUSCULAR | Status: AC
  Filled 2019-11-18: qty 30

## 2019-11-18 MED ORDER — FENTANYL CITRATE (PF) 100 MCG/2ML IJ SOLN
INTRAMUSCULAR | Status: AC
  Filled 2019-11-18: qty 2

## 2019-11-18 MED ORDER — PROMETHAZINE HCL 25 MG/ML IJ SOLN: 6.2500 mg | INTRAMUSCULAR | Status: DC | PRN

## 2019-11-18 MED ORDER — PROPOFOL 10 MG/ML IV BOLUS
INTRAVENOUS | Status: AC
  Filled 2019-11-18: qty 20

## 2019-11-18 MED ORDER — ROCURONIUM BROMIDE 10 MG/ML (PF) SYRINGE
PREFILLED_SYRINGE | INTRAVENOUS | Status: AC
  Filled 2019-11-18: qty 10

## 2019-11-18 MED ORDER — DEXAMETHASONE SODIUM PHOSPHATE 10 MG/ML IJ SOLN
INTRAMUSCULAR | Status: AC
  Filled 2019-11-18: qty 1

## 2019-11-18 MED ORDER — ONDANSETRON HCL 4 MG/2ML IJ SOLN
INTRAMUSCULAR | Status: DC | PRN
  Administered 2019-11-18: 4 mg via INTRAVENOUS

## 2019-11-18 MED ORDER — MIDAZOLAM HCL 2 MG/2ML IJ SOLN
INTRAMUSCULAR | Status: AC
  Filled 2019-11-18: qty 2

## 2019-11-18 MED ORDER — LIDOCAINE 2% (20 MG/ML) 5 ML SYRINGE
INTRAMUSCULAR | Status: DC | PRN
  Administered 2019-11-18: 80 mg via INTRAVENOUS

## 2019-11-18 MED ORDER — LACTATED RINGERS IV SOLN: INTRAVENOUS | Status: DC | PRN

## 2019-11-18 MED ORDER — SUGAMMADEX SODIUM 200 MG/2ML IV SOLN
INTRAVENOUS | Status: DC | PRN
  Administered 2019-11-18: 200 mg via INTRAVENOUS

## 2019-11-18 MED ORDER — PHENYLEPHRINE 40 MCG/ML (10ML) SYRINGE FOR IV PUSH (FOR BLOOD PRESSURE SUPPORT)
PREFILLED_SYRINGE | INTRAVENOUS | Status: AC
  Filled 2019-11-18: qty 10

## 2019-11-18 MED ORDER — ONDANSETRON HCL 4 MG/2ML IJ SOLN
INTRAMUSCULAR | Status: AC
  Filled 2019-11-18: qty 2

## 2019-11-18 MED ORDER — VANCOMYCIN HCL IN DEXTROSE 1-5 GM/200ML-% IV SOLN
1000.0000 mg | INTRAVENOUS | Status: AC
  Administered 2019-11-18: 12:00:00 1000 mg via INTRAVENOUS
  Filled 2019-11-18: qty 200

## 2019-11-18 MED ORDER — SUCCINYLCHOLINE CHLORIDE 200 MG/10ML IV SOSY
PREFILLED_SYRINGE | INTRAVENOUS | Status: AC
  Filled 2019-11-18: qty 10

## 2019-11-18 MED ORDER — DEXAMETHASONE SODIUM PHOSPHATE 10 MG/ML IJ SOLN
INTRAMUSCULAR | Status: DC | PRN
  Administered 2019-11-18: 5 mg via INTRAVENOUS

## 2019-11-18 MED ORDER — HYDROMORPHONE HCL 1 MG/ML IJ SOLN
0.2500 mg | INTRAMUSCULAR | Status: DC | PRN
  Administered 2019-11-18 (×2): 0.5 mg via INTRAVENOUS

## 2019-11-18 MED ORDER — PHENYLEPHRINE 40 MCG/ML (10ML) SYRINGE FOR IV PUSH (FOR BLOOD PRESSURE SUPPORT)
PREFILLED_SYRINGE | INTRAVENOUS | Status: DC | PRN
  Administered 2019-11-18 (×2): 80 ug via INTRAVENOUS
  Administered 2019-11-18: 120 ug via INTRAVENOUS

## 2019-11-18 MED ORDER — PROPOFOL 10 MG/ML IV BOLUS
INTRAVENOUS | Status: DC | PRN
  Administered 2019-11-18: 150 mg via INTRAVENOUS

## 2019-11-18 MED ORDER — FENTANYL CITRATE (PF) 100 MCG/2ML IJ SOLN
INTRAMUSCULAR | Status: DC | PRN
  Administered 2019-11-18: 50 ug via INTRAVENOUS

## 2019-11-18 MED ORDER — OXYCODONE HCL 5 MG PO TABS
ORAL_TABLET | ORAL | Status: AC
  Filled 2019-11-18: qty 1

## 2019-11-18 MED ORDER — LACTATED RINGERS IV SOLN: INTRAVENOUS | Status: DC

## 2019-11-18 MED ORDER — FENTANYL CITRATE (PF) 250 MCG/5ML IJ SOLN
INTRAMUSCULAR | Status: AC
  Filled 2019-11-18: qty 5

## 2019-11-18 SURGICAL SUPPLY — 64 items
BIT DRILL 1.8 CANN MAX VPC (BIT) ×2 IMPLANT
BIT DRILL CANN 2.4 (BIT) ×2
BIT DRILL CANN 2.4MM (BIT) ×1
BIT DRILL CANN MAX VPC 2.4 (BIT) IMPLANT
BLADE SURG 15 STRL LF DISP TIS (BLADE) IMPLANT
BLADE SURG 15 STRL SS (BLADE) ×3
BNDG CMPR 75X41 PLY HI ABS (GAUZE/BANDAGES/DRESSINGS) ×1
BNDG CMPR 9X4 STRL LF SNTH (GAUZE/BANDAGES/DRESSINGS) ×1
BNDG COHESIVE 4X5 TAN STRL (GAUZE/BANDAGES/DRESSINGS) ×1 IMPLANT
BNDG ELASTIC 3X5.8 VLCR STR LF (GAUZE/BANDAGES/DRESSINGS) ×3 IMPLANT
BNDG ELASTIC 4X5.8 VLCR STR LF (GAUZE/BANDAGES/DRESSINGS) ×6 IMPLANT
BNDG ESMARK 4X9 LF (GAUZE/BANDAGES/DRESSINGS) ×3 IMPLANT
BNDG GAUZE ELAST 4 BULKY (GAUZE/BANDAGES/DRESSINGS) ×9 IMPLANT
BNDG STRETCH 4X75 STRL LF (GAUZE/BANDAGES/DRESSINGS) ×3 IMPLANT
CORD BIPOLAR FORCEPS 12FT (ELECTRODE) ×3 IMPLANT
COVER MAYO STAND STRL (DRAPES) ×3 IMPLANT
COVER SURGICAL LIGHT HANDLE (MISCELLANEOUS) ×3 IMPLANT
COVER WAND RF STERILE (DRAPES) ×3 IMPLANT
CUFF TOURN SGL QUICK 24 (TOURNIQUET CUFF) ×3
CUFF TRNQT CYL 24X4X16.5-23 (TOURNIQUET CUFF) IMPLANT
DRAPE INCISE IOBAN 66X45 STRL (DRAPES) ×3 IMPLANT
DRAPE OEC MINIVIEW 54X84 (DRAPES) ×3 IMPLANT
DRSG ADAPTIC 3X8 NADH LF (GAUZE/BANDAGES/DRESSINGS) ×2 IMPLANT
GAUZE SPONGE 4X4 12PLY STRL (GAUZE/BANDAGES/DRESSINGS) ×3 IMPLANT
GAUZE XEROFORM 1X8 LF (GAUZE/BANDAGES/DRESSINGS) ×3 IMPLANT
GAUZE XEROFORM 5X9 LF (GAUZE/BANDAGES/DRESSINGS) ×2 IMPLANT
GLOVE BIOGEL PI IND STRL 8 (GLOVE) ×1 IMPLANT
GLOVE BIOGEL PI INDICATOR 8 (GLOVE) ×2
GOWN STRL REUS W/ TWL LRG LVL3 (GOWN DISPOSABLE) ×2 IMPLANT
GOWN STRL REUS W/ TWL XL LVL3 (GOWN DISPOSABLE) ×3 IMPLANT
GOWN STRL REUS W/TWL LRG LVL3 (GOWN DISPOSABLE) ×6
GOWN STRL REUS W/TWL XL LVL3 (GOWN DISPOSABLE) ×9
K-WIRE COCR 0.9X95 (WIRE) ×3
K-WIRE COCR 1.1X105 (WIRE) ×3
KIT BASIN OR (CUSTOM PROCEDURE TRAY) ×3 IMPLANT
KIT TURNOVER KIT B (KITS) ×3 IMPLANT
KWIRE COCR 0.9X95 (WIRE) ×1 IMPLANT
KWIRE COCR 1.1X105 (WIRE) ×1 IMPLANT
LOOP VESSEL MAXI BLUE (MISCELLANEOUS) ×2 IMPLANT
MANIFOLD NEPTUNE II (INSTRUMENTS) ×3 IMPLANT
NEEDLE HYPO 25GX1X1/2 BEV (NEEDLE) ×3 IMPLANT
NS IRRIG 1000ML POUR BTL (IV SOLUTION) ×3 IMPLANT
PACK ORTHO EXTREMITY (CUSTOM PROCEDURE TRAY) ×3 IMPLANT
PAD ARMBOARD 7.5X6 YLW CONV (MISCELLANEOUS) ×6 IMPLANT
PAD CAST 4YDX4 CTTN HI CHSV (CAST SUPPLIES) ×2 IMPLANT
PADDING CAST COTTON 4X4 STRL (CAST SUPPLIES) ×12
SCREW COMP HEADLESS 2.5X24 (Screw) ×2 IMPLANT
SCREW MAX VPC 3.4X24MM (Screw) ×3 IMPLANT
SOL PREP POV-IOD 4OZ 10% (MISCELLANEOUS) ×7 IMPLANT
SPECIMEN JAR SMALL (MISCELLANEOUS) ×3 IMPLANT
SPLINT FIBERGLASS 4X30 (CAST SUPPLIES) ×2 IMPLANT
SUT MAXBRAID #2 CVD NDL (SUTURE) ×2 IMPLANT
SUT PROLENE 3 0 PS 1 (SUTURE) ×6 IMPLANT
SUT PROLENE 3 0 PS 2 (SUTURE) IMPLANT
SUT VIC AB 0 CT1 36 (SUTURE) ×2 IMPLANT
SUT VIC AB 3-0 FS2 27 (SUTURE) ×1 IMPLANT
SYR CONTROL 10ML LL (SYRINGE) IMPLANT
TOWEL GREEN STERILE (TOWEL DISPOSABLE) ×6 IMPLANT
TOWEL GREEN STERILE FF (TOWEL DISPOSABLE) ×3 IMPLANT
TUBE CONNECTING 12'X1/4 (SUCTIONS)
TUBE CONNECTING 12X1/4 (SUCTIONS) IMPLANT
UNDERPAD 30X30 (UNDERPADS AND DIAPERS) ×3 IMPLANT
WATER STERILE IRR 1000ML POUR (IV SOLUTION) ×3 IMPLANT
YANKAUER SUCT BULB TIP NO VENT (SUCTIONS) ×3 IMPLANT

## 2019-11-18 NOTE — Progress Notes (Signed)
Patient requested her belongings be left with her dad.  I hand delivered patients purse, cell phone, and clothing to Patients father.

## 2019-11-18 NOTE — Discharge Instructions (Signed)
Please ice elevate and move your fingers frequently in your left arm.  We have put you in a splint so that you will keep your elbow still.  When we see you back in 14 days we will begin range of motion.  Please call for any problems.  Your pain medicines have been phoned into your pharmacy  Keep bandage clean and dry.  Call for any problems.  No smoking.  Criteria for driving a car: you should be off your pain medicine for 7-8 hours, able to drive one handed(confident), thinking clearly and feeling able in your judgement to drive. Continue elevation as it will decrease swelling.  If instructed by MD move your fingers within the confines of the bandage/splint.  Use ice if instructed by your MD. Call immediately for any sudden loss of feeling in your hand/arm or change in functional abilities of the extremity.We recommend that you to take vitamin C 1000 mg a day to promote healing. We also recommend that if you require  pain medicine that you take a stool softener to prevent constipation as most pain medicines will have constipation side effects. We recommend either Peri-Colace or Senokot and recommend that you also consider adding MiraLAX as well to prevent the constipation affects from pain medicine if you are required to use them. These medicines are over the counter and may be purchased at a local pharmacy. A cup of yogurt and a probiotic can also be helpful during the recovery process as the medicines can disrupt your intestinal environment.

## 2019-11-18 NOTE — H&P (Signed)
Mary [name] is an 39 y.o. female.   Chief Complaint: Left elbow fracture subluxation HPI: Patient presents for surgical evaluation and care for her left elbow.  She had a fall over the weekend has a capitellar shear fracture with ligamentous injury.  This is a fracture subluxation of the elbow.  I counseled her in regards to risk and benefits of surgery.  Patient and I discussed relevant issues and features to remainder upper extremity predicament.  I discussed with her risk of stiffness and neurovascular injury with surgical endeavors.  I discussed with her needed therapeutic endeavors postoperatively.  Patient presents for evaluation and treatment of the of their upper extremity predicament. The patient denies neck, back, chest or  abdominal pain. The patient notes that they have no lower extremity problems. The patients primary complaint is noted. We are planning surgical care pathway for the upper extremity.  Past Medical History:  Diagnosis Date  . Anxiety   . Asthma   . Bipolar 1 disorder ([place])   . Bulging lumbar disc   . BV (bacterial vaginosis) 02/18/2018  . Cancer of cervix ([place])   . Carpal tunnel syndrome on right   . Cervical cancer ([place])   . Chronic back pain   . COPD (chronic obstructive pulmonary disease) ([place])   . Depression   . Hypertension   . Migraines   . MS (multiple sclerosis) ([place])   . Physiological ovarian cysts   . PTSD (post-traumatic stress disorder)   . Rheumatoid arthritis ([place])   . S/P partial hysterectomy     Past Surgical History:  Procedure Laterality Date  . ABDOMINAL HYSTERECTOMY    . ABDOMINAL HYSTERECTOMY    . EYE SURGERY  age 56   Artificial right eye    Family History  Problem Relation Age of Onset  . Stroke Mother        TIA  . Asthma Mother   . Cancer Mother   . Heart attack Father   . Hypertension Father   . Cancer Father        skin cancer  . Asthma Daughter   . Sleep apnea Son   . Diabetes Maternal Grandmother   .  Heart Problems Maternal Grandmother   . Anuerysm Maternal Grandfather   . Seizures Maternal Grandfather   . Cancer Paternal Grandmother        breast  . Cancer Sister        cervical  . Cancer Sister        cervical  . Cancer Brother        thyroid cancer  . Cancer Maternal Aunt        breast cancer  . Cancer Paternal Aunt    Social History:  reports that she has never smoked. She has never used smokeless tobacco. She reports that she does not drink alcohol or use drugs.  Allergies:  Allergies  Allergen Reactions  . Buprenorphine Hcl Anaphylaxis    "it stops my heart"  . Morphine And Related Anaphylaxis    "it stops my heart"  . Penicillins Anaphylaxis and Other (See Comments)    Has patient had a PCN reaction causing immediate rash, facial/tongue/throat swelling, SOB or lightheadedness with hypotension: Yes Has patient had a PCN reaction causing severe rash involving mucus membranes or skin necrosis: Yes Has patient had a PCN reaction that required hospitalization: Yes Has patient had a PCN reaction occurring within the last 10 years: No If all of the above answers are "NO", then  may proceed with Cephalosporin use.   . Gabapentin Other (See Comments)    Per patient "hallucination"  . Ibuprofen & Acetaminophen Nausea And Vomiting  . Tylenol [Acetaminophen] Nausea And Vomiting  . Latex Rash    Medications Prior to Admission  Medication Sig Dispense Refill  . albuterol (VENTOLIN HFA) 108 (90 Base) MCG/ACT inhaler Inhale 2 puffs into the lungs every 6 (six) hours as needed for wheezing or shortness of breath. 18 g 1  . ALPRAZolam (XANAX) 0.5 MG tablet Take 0.5 mg by mouth at bedtime.     Marland Kitchen aspirin 81 MG chewable tablet Chew 81 mg by mouth in the morning.     . cholecalciferol (VITAMIN D3) 25 MCG (1000 UNIT) tablet Take 1,000 Units by mouth daily.    . furosemide (LASIX) 40 MG tablet Take 40 mg by mouth daily.    Marland Kitchen losartan (COZAAR) 50 MG tablet Take 1 tablet (50 mg total) by  mouth daily. 90 tablet 1  . methocarbamol (ROBAXIN) 500 MG tablet Take 1 tablet (500 mg total) by mouth 3 (three) times daily. 21 tablet 0  . mometasone-formoterol (DULERA) 100-5 MCG/ACT AERO Inhale 2 puffs into the lungs 2 (two) times daily. 8.8 g 1  . naproxen (NAPROSYN) 500 MG tablet Take 1 tablet (500 mg total) by mouth 2 (two) times daily with a meal. 20 tablet 0  . ondansetron (ZOFRAN) 4 MG tablet Take 1 tablet (4 mg total) by mouth every 8 (eight) hours as needed for nausea or vomiting. (Patient taking differently: Take 4 mg by mouth every 4 (four) hours as needed for nausea or vomiting. ) 4 tablet 0  . oxycodone (OXY-IR) 5 MG capsule Take 5-10 mg by mouth every 8 (eight) hours as needed for pain.     . SUMAtriptan (IMITREX) 50 MG tablet Take 50 mg by mouth as needed for migraine. May repeat in 2 hours if headache persists or recurs.    . vitamin B-12 (CYANOCOBALAMIN) 50 MCG tablet Take 50 mcg by mouth daily.      Results for orders placed or performed during the hospital encounter of 11/17/19 (from the past 48 hour(s))  SARS CORONAVIRUS 2 (TAT 6-24 HRS) Nasopharyngeal Nasopharyngeal Swab     Status: None   Collection Time: 11/17/19 12:33 PM   Specimen: Nasopharyngeal Swab  Result Value Ref Range   SARS Coronavirus 2 NEGATIVE NEGATIVE    Comment: (NOTE) SARS-CoV-2 target nucleic acids are NOT DETECTED. The SARS-CoV-2 RNA is generally detectable in upper and lower respiratory specimens during the acute phase of infection. Negative results do not preclude SARS-CoV-2 infection, do not rule out co-infections with other pathogens, and should not be used as the sole basis for treatment or other patient management decisions. Negative results must be combined with clinical observations, patient history, and epidemiological information. The expected result is Negative. Fact Sheet for Patients: SugarRoll.be Fact Sheet for Healthcare  Providers: https://www.woods-mathews.com/ This test is not yet approved or cleared by the [place] FDA and  has been authorized for detection and/or diagnosis of SARS-CoV-2 by FDA under an Emergency Use Authorization (EUA). This EUA will remain  in effect (meaning this test can be used) for the duration of the COVID-19 declaration under Section 56 4(b)(1) of the Act, 21 U.S.C. section 360bbb-3(b)(1), unless the authorization is terminated or revoked sooner. Performed at [facility], [place] [address], [place], [place] [address]    No results found.  Review of Systems  Respiratory: Negative.   Cardiovascular:  Negative.   Gastrointestinal: Negative.   Endocrine: Negative.   Genitourinary: Negative.     There were no vitals taken for this visit. Physical Exam  Fracture subluxation left elbow with capitellum shear fracture.  I reviewed her chart radiographs and exam in detail.  The patient is alert and oriented in no acute distress. The patient complains of pain in the affected upper extremity.  The patient is noted to have a normal HEENT exam. Lung fields show equal chest expansion and no shortness of breath. Abdomen exam is nontender without distention. Lower extremity examination does not show any fracture dislocation or blood clot symptoms. Pelvis is stable and the neck and back are stable and nontender. Assessment/Plan We will plan for surgical reconstruction left elbow fracture subluxation with capitellum shear fracture.  I discussed all issues with patient at length and the findings.  We are planning surgery for your upper extremity. The risk and benefits of surgery to include risk of bleeding, infection, anesthesia,  damage to normal structures and failure of the surgery to accomplish its intended goals of relieving symptoms and restoring function have been discussed in detail. With this in mind we plan to proceed. I have specifically discussed with the  patient the pre-and postoperative regime and the dos and don'ts and risk and benefits in great detail. Risk and benefits of surgery also include risk of dystrophy(CRPS), chronic nerve pain, failure of the healing process to go onto completion and other inherent risks of surgery The relavent the pathophysiology of the disease/injury process, as well as the alternatives for treatment and postoperative course of action has been discussed in great detail with the patient who desires to proceed.  We will do everything in our power to help you (the patient) restore function to the upper extremity. It is a pleasure to see this patient today.   Willa Frater III, MD 11/18/2019, 9:59 AM

## 2019-11-18 NOTE — Anesthesia Postprocedure Evaluation (Signed)
Anesthesia Post Note  Patient: Mary [name]  Procedure(s) Performed: OPEN REDUCTION WITH REPAIR VERSUS FRAGMENT EXCISION CAPITELLUM FRACTURE LEFT ELBOW AND LIGAMENTOUS RECONSTRUCTION AS NECESSARY (Left Elbow)     Patient location during evaluation: PACU Anesthesia Type: General Level of consciousness: awake and alert Pain management: pain level controlled Vital Signs Assessment: post-procedure vital signs reviewed and stable Respiratory status: spontaneous breathing, nonlabored ventilation and respiratory function stable Cardiovascular status: blood pressure returned to baseline and stable Postop Assessment: no apparent nausea or vomiting Anesthetic complications: no    Last Vitals:  Vitals:   11/18/19 1621 11/18/19 1630  BP: 128/89   Pulse: (!) 55 67  Resp: 12 14  Temp:  (!) 36.2 C  SpO2: 100% 99%    Last Pain:  Vitals:   11/18/19 1606  TempSrc:   PainSc: 8                  Catalina Gravel

## 2019-11-18 NOTE — Transfer of Care (Signed)
Immediate Anesthesia Transfer of Care Note  Patient: Mary [name]  Procedure(s) Performed: OPEN REDUCTION WITH REPAIR VERSUS FRAGMENT EXCISION CAPITELLUM FRACTURE LEFT ELBOW AND LIGAMENTOUS RECONSTRUCTION AS NECESSARY (Left Elbow)  Patient Location: PACU  Anesthesia Type:General  Level of Consciousness: lethargic and responds to stimulation  Airway & Oxygen Therapy: Patient Spontanous Breathing  Post-op Assessment: Report given to RN  Post vital signs: Reviewed and stable  Last Vitals:  Vitals Value Taken Time  BP    Temp    Pulse    Resp    SpO2      Last Pain:  Vitals:   11/18/19 1024  TempSrc:   PainSc: 8       Patients Stated Pain Goal: 3 ([id number])  Complications: No apparent anesthesia complications

## 2019-11-18 NOTE — Anesthesia Procedure Notes (Signed)
Procedure Name: Intubation Date/Time: 11/18/2019 1:27 PM Performed by: Barrington Ellison, CRNA Pre-anesthesia Checklist: Patient identified, Emergency Drugs available, Suction available and Patient being monitored Patient Re-evaluated:Patient Re-evaluated prior to induction Oxygen Delivery Method: Circle System Utilized Preoxygenation: Pre-oxygenation with 100% oxygen Induction Type: IV induction and Rapid sequence Ventilation: Mask ventilation without difficulty Laryngoscope Size: Mac and 3 Grade View: Grade I Tube type: Oral Tube size: 7.0 mm Number of attempts: 1 Airway Equipment and Method: Stylet and Oral airway Placement Confirmation: ETT inserted through vocal cords under direct vision,  positive ETCO2 and breath sounds checked- equal and bilateral Secured at: 21 cm Tube secured with: Tape Dental Injury: Teeth and Oropharynx as per pre-operative assessment

## 2019-11-18 NOTE — Op Note (Signed)
Operative note November 18, 2019  Mary Kaufman MD  Preoperative diagnosis fracture subluxation left elbow with capitellum shear fracture and ulnar collateral ligament/lateral ulnar collateral ligament injury  Postop diagnosis the same  Operative procedure #1 open reduction internal fixation distal humerus fracture intra-articular capitellum shear fracture #2 lateral ulnar collateral ligament reconstruction repair left elbow #3 5 view radiographic series and fluoroscopy performed examined and interpreted by myself left elbow  Surgeon Mary [name]  Anesthesia General  Tourniquet time less than 2 hours  Estimate blood loss minimal  Indications for the procedure: This patient fell of the weekend sustained a shear fracture she has large amounts of small bone fragments within the joint and a significant capitellum shear fracture which we hope to reconstruct.  I discussed with her the above-mentioned operative intervention and other issues germane to her predicament.  Operative procedure: Patient was seen by myself and anesthesia.  She was taken to the operative theater.  She was scrub with 2 Hibiclens scrubs followed by Betadine scrub and paint.  This was performed by myself and sterile field was secured.  Timeout was observed and the patient's body parts were well-padded with SCD compression hose on.  Preoperative vancomycin was given.  At this time we inflated the tourniquet and performed a utilitarian lateral approach to the elbow.  Interval between the fat and fascia was created nicely.  Following this the patient's dissection ensued about the lateral condyle and epicondyle I very carefully elevated off the extensor apparatus like to get into the joint and view the capitellum.  I took care to not take down the annular ligament.  The patient had multiple bone chips and fragments as well as a fairly large 2 x 1-1/2 cm capitellum shear fracture with some degree of cancellus bone on it.  I felt  this would be reconstruct we will thus we performed synovectomy removal of loose joint fragment pieces and following this performed irrigation of the joint.  Following the arthrotomy and synovectomy I then reassembled the capitellum piece I made sure it fit well and interdigitated as well as possible.  I then placed a headless screw from [employer].  This was performed utilizing guidewire followed by depth gauge measurement followed by placement of a cannulated compression screw.  This allowed coaptation of cancellus surfaces under compression and provided very good fixation.  I was quite pleased with this.  Given the small size only one screw was placed.  This was checked under image and looked to be excellent.  I then performed additional irrigation and removed any further bone chips that might become free floaters and then performed a lateral ulnar collateral ligament reconstruction with max braid suture.  The pants over vest technique ensuring technique was used to perform repair of the lateral ulnar collateral ligament.  The patient tolerated this well.  I then deflated the tourniquet and closed the fascia with Vicryl and tested stability.  All looked well I was pleased with the stability.  X-rays were taken during the course of the procedure and looked excellent in terms of the alignment.  I was quite pleased with this.  She had closure of the wound with 3-0 Prolene Adaptic Xeroform 4 x 4's and a long-arm splint with Luciana Axe bridge placed there were no complicating features.  She will be observed in the recovery room if looking well will allow her to go home.  I discussed with her all issues plans and concerns.  Should any problems arise she will notify me.  It was a pleasure to see her today anticipating her care.  We will begin well arm assisted range of motion at her first postop visit.  Etna Forquer MD

## 2019-11-19 ENCOUNTER — Encounter: Payer: Self-pay | Admitting: *Deleted

## 2019-11-24 ENCOUNTER — Ambulatory Visit: Payer: Medicaid Other | Admitting: Physician Assistant

## 2019-11-24 ENCOUNTER — Encounter: Payer: Self-pay | Admitting: Physician Assistant

## 2019-11-24 VITALS — BP 128/90 | HR 68 | Temp 98.1°F | Ht 69.0 in | Wt 321.0 lb

## 2019-11-24 DIAGNOSIS — G8929 Other chronic pain: Secondary | ICD-10-CM

## 2019-11-24 DIAGNOSIS — I10 Essential (primary) hypertension: Secondary | ICD-10-CM

## 2019-11-24 DIAGNOSIS — F489 Nonpsychotic mental disorder, unspecified: Secondary | ICD-10-CM

## 2019-11-24 NOTE — Progress Notes (Signed)
BP 128/90   Pulse 68   Temp 98.1 F (36.7 C)   Ht 5\' 9"  (1.753 m)   Wt (!) 321 lb (145.6 kg)   SpO2 96%   BMI 47.40 kg/m    Subjective:    Patient ID: Mary [name], female    DOB: October 23, 1980, 39 y.o.   MRN: [id number]  HPI: Mary [name] is a 39 y.o. female presenting on 11/24/2019 for No chief complaint on file.   HPI     Pt had a negative covid 19 screening questionnaire.    Pt is 5yoF who presented to office 1 month ago to establish care.   Pt fell and broke arm.  She had surgery.  She says she has f/u scheduled with orthopedist.   She has appt with neurology next week.    Pt did not make it to [facility] because she fell and broke her arm.  She is planning to go still  She submitted [facility] financial assistance application     Relevant past medical, surgical, family and social history reviewed and updated as indicated. Interim medical history since our last visit reviewed. Allergies and medications reviewed and updated.   Current Outpatient Medications:  .  albuterol (VENTOLIN HFA) 108 (90 Base) MCG/ACT inhaler, Inhale 2 puffs into the lungs every 6 (six) hours as needed for wheezing or shortness of breath., Disp: 18 g, Rfl: 1 .  ALPRAZolam (XANAX) 0.5 MG tablet, Take 0.5 mg by mouth at bedtime. , Disp: , Rfl:  .  losartan (COZAAR) 50 MG tablet, Take 1 tablet (50 mg total) by mouth daily., Disp: 90 tablet, Rfl: 1 .  methocarbamol (ROBAXIN) 500 MG tablet, Take 1 tablet (500 mg total) by mouth 3 (three) times daily., Disp: 21 tablet, Rfl: 0 .  mometasone-formoterol (DULERA) 100-5 MCG/ACT AERO, Inhale 2 puffs into the lungs 2 (two) times daily., Disp: 8.8 g, Rfl: 1 .  naproxen (NAPROSYN) 500 MG tablet, Take 1 tablet (500 mg total) by mouth 2 (two) times daily with a meal., Disp: 20 tablet, Rfl: 0 .  ondansetron (ZOFRAN) 4 MG tablet, Take 1 tablet (4 mg total) by mouth every 8 (eight) hours as needed for nausea or vomiting. (Patient taking differently:  Take 4 mg by mouth every 4 (four) hours as needed for nausea or vomiting. ), Disp: 4 tablet, Rfl: 0 .  oxycodone (OXY-IR) 5 MG capsule, Take 5-10 mg by mouth every 8 (eight) hours as needed for pain. , Disp: , Rfl:  .  SUMAtriptan (IMITREX) 50 MG tablet, Take 50 mg by mouth as needed for migraine. May repeat in 2 hours if headache persists or recurs., Disp: , Rfl:  .  aspirin 81 MG chewable tablet, Chew 81 mg by mouth in the morning. , Disp: , Rfl:  .  cholecalciferol (VITAMIN D3) 25 MCG (1000 UNIT) tablet, Take 1,000 Units by mouth daily., Disp: , Rfl:  .  furosemide (LASIX) 40 MG tablet, Take 40 mg by mouth daily., Disp: , Rfl:  .  vitamin B-12 (CYANOCOBALAMIN) 50 MCG tablet, Take 50 mcg by mouth daily., Disp: , Rfl:    Review of Systems  Per HPI unless specifically indicated above     Objective:    BP 128/90   Pulse 68   Temp 98.1 F (36.7 C)   Ht 5\' 9"  (1.753 m)   Wt (!) 321 lb (145.6 kg)   SpO2 96%   BMI 47.40 kg/m   Wt Readings from Last 3 Encounters:  11/24/19 (!) 321 lb (145.6 kg)  11/18/19 210 lb (95.3 kg)  11/15/19 210 lb (95.3 kg)    Physical Exam Vitals reviewed.  Constitutional:      General: She is not in acute distress.    Appearance: She is well-developed. She is obese. She is not ill-appearing.  HENT:     Head: Normocephalic and atraumatic.  Cardiovascular:     Rate and Rhythm: Normal rate and regular rhythm.  Pulmonary:     Effort: Pulmonary effort is normal.     Breath sounds: Normal breath sounds.  Abdominal:     General: Bowel sounds are normal.     Palpations: Abdomen is soft. There is no mass.     Tenderness: There is no abdominal tenderness.  Musculoskeletal:     Cervical back: Neck supple.     Right lower leg: No edema.     Left lower leg: No edema.  Lymphadenopathy:     Cervical: No cervical adenopathy.  Skin:    General: Skin is warm and dry.  Neurological:     Mental Status: She is alert and oriented to person, place, and time.   Psychiatric:        Behavior: Behavior normal.    LE no edema.  + adipose          Assessment & Plan:    Encounter Diagnoses  Name Primary?  . Essential hypertension Yes  . Morbid obesity ([place])   . Other chronic pain   . Mental health problem      -Reviewed labs with pt- all normal -Refer to RD for help with utritional counseling.  Pt encouraged to schedule appt for about 6 week out when her arm will feel better -pt to see Neurology as scheduled -pt to Go to [facility] for [place] issues -pt to continue with Orthopedics for arm follow up -covid vaccination scheduled -Reviewed ct chest with pt -pt to follow up 3 months.  She is to contact office sooner prn

## 2019-11-24 NOTE — Patient Instructions (Signed)
COVID VACCINATION APPOINTMENT   December 02, 2019-  2:00pm

## 2019-12-02 ENCOUNTER — Encounter: Payer: Self-pay | Admitting: Neurology

## 2019-12-02 ENCOUNTER — Telehealth: Payer: Self-pay | Admitting: Neurology

## 2019-12-02 ENCOUNTER — Ambulatory Visit (INDEPENDENT_AMBULATORY_CARE_PROVIDER_SITE_OTHER): Payer: Self-pay | Admitting: Neurology

## 2019-12-02 ENCOUNTER — Other Ambulatory Visit: Payer: Self-pay

## 2019-12-02 VITALS — BP 131/83 | HR 62 | Temp 97.6°F | Ht 69.0 in | Wt 316.5 lb

## 2019-12-02 DIAGNOSIS — R52 Pain, unspecified: Secondary | ICD-10-CM | POA: Insufficient documentation

## 2019-12-02 DIAGNOSIS — G43709 Chronic migraine without aura, not intractable, without status migrainosus: Secondary | ICD-10-CM

## 2019-12-02 DIAGNOSIS — IMO0002 Reserved for concepts with insufficient information to code with codable children: Secondary | ICD-10-CM

## 2019-12-02 DIAGNOSIS — G43009 Migraine without aura, not intractable, without status migrainosus: Secondary | ICD-10-CM | POA: Insufficient documentation

## 2019-12-02 MED ORDER — DULOXETINE HCL 60 MG PO CPEP: 60.0000 mg | ORAL_CAPSULE | Freq: Every day | ORAL | 11 refills | Status: DC

## 2019-12-02 MED ORDER — RIZATRIPTAN BENZOATE 10 MG PO TBDP: 10.0000 mg | ORAL_TABLET | ORAL | 6 refills | Status: DC | PRN

## 2019-12-02 NOTE — Progress Notes (Signed)
PATIENT: Mary [name] DOB: [birth year]  Chief Complaint  Patient presents with  . Migraine    She has some type of headache daily. She uses Imitrex for her most severe pain which helps if she catches it early. She has never been on a daily preventive medication.   . Right-sided weakness    States she has had right-sided weakness since 2011. Feels it has progressively become worse over the years and it limits her physical activities.   . Pain    States chronic pain is being managed by PCP. Once she gets Medicare, she plans to see pain management.  . 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 She was never referred to a neurologist. She has never seen documentation of a formal diagnosis. She had normal MRI brain in 2015.   Marland Kitchen PCP    Soyla Dryer, PA-C     HISTORICAL  Mary [name] is a 39 year old female, seen in request by her primary care PA Soyla Dryer for evaluation of migraine headaches, right side weakness.  Initial evaluation was on December 02, 2019.  I have reviewed and summarized the referring note from the referring physician.  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  Has history of chronic migraine headaches, and also chronic body achy pain, under pain management, was treated with Percocet for many years, now she is receiving oxycodone prescription from her primary care doctor.  She reported a history of migraine since age 32s, tends to stay at the right retro-orbital area, severe pounding headache with associated light, noise sensitivity, lasting for hours to days, she was having it couple times each week, over-the-counter NSAIDs, does not help, Imitrex provide partial relief, she has not tried other triptans in the past.  She also complains of intermittent right-sided weakness since 2011, felt like it has progressed since then, with some limitation in her daily activity.  She worried about possibility of multiple  sclerosis,  I personally reviewed MRI of the brain without contrast in July 2015 that was normal  Laboratory evaluations in 2021, normal CBC, CMP, lipid panel, TSH, A1c 4.7  REVIEW OF SYSTEMS: Full 14 system review of systems performed and notable only for as above All other review of systems were negative.  ALLERGIES: Allergies  Allergen Reactions  . Buprenorphine Hcl Anaphylaxis    "it stops my heart"  . Morphine And Related Anaphylaxis    "it stops my heart"  . Penicillins Anaphylaxis and Other (See Comments)    Has patient had a PCN reaction causing immediate rash, facial/tongue/throat swelling, SOB or lightheadedness with hypotension: Yes Has patient had a PCN reaction causing severe rash involving mucus membranes or skin necrosis: Yes Has patient had a PCN reaction that required hospitalization: Yes Has patient had a PCN reaction occurring within the last 10 years: No If all of the above answers are "NO", then may proceed with Cephalosporin use.   . Gabapentin Other (See Comments)    Per patient "hallucination"  . Ibuprofen & Acetaminophen Nausea And Vomiting  . Latex Rash  . Tylenol [Acetaminophen] Nausea And Vomiting    HOME MEDICATIONS: Current Outpatient Medications  Medication Sig Dispense Refill  . albuterol (VENTOLIN HFA) 108 (90 Base) MCG/ACT inhaler Inhale 2 puffs into the lungs every 6 (six) hours as needed for wheezing or shortness of breath. 18 g 1  . ALPRAZolam (XANAX) 0.5 MG tablet Take 0.5 mg by mouth  at bedtime.     Marland Kitchen aspirin 81 MG chewable tablet Chew 81 mg by mouth in the morning.     . cholecalciferol (VITAMIN D3) 25 MCG (1000 UNIT) tablet Take 1,000 Units by mouth daily.    . furosemide (LASIX) 40 MG tablet Take 40 mg by mouth daily.    Marland Kitchen losartan (COZAAR) 50 MG tablet Take 1 tablet (50 mg total) by mouth daily. 90 tablet 1  . methocarbamol (ROBAXIN) 500 MG tablet Take 1 tablet (500 mg total) by mouth 3 (three) times daily. 21 tablet 0  .  mometasone-formoterol (DULERA) 100-5 MCG/ACT AERO Inhale 2 puffs into the lungs 2 (two) times daily. 8.8 g 1  . naproxen (NAPROSYN) 500 MG tablet Take 1 tablet (500 mg total) by mouth 2 (two) times daily with a meal. 20 tablet 0  . ondansetron (ZOFRAN) 4 MG tablet Take 1 tablet (4 mg total) by mouth every 8 (eight) hours as needed for nausea or vomiting. (Patient taking differently: Take 4 mg by mouth every 4 (four) hours as needed for nausea or vomiting. ) 4 tablet 0  . oxycodone (OXY-IR) 5 MG capsule Take 5-10 mg by mouth every 8 (eight) hours as needed for pain.     . SUMAtriptan (IMITREX) 50 MG tablet Take 50 mg by mouth as needed for migraine. May repeat in 2 hours if headache persists or recurs.    . vitamin B-12 (CYANOCOBALAMIN) 50 MCG tablet Take 50 mcg by mouth daily.     No current facility-administered medications for this visit.    PAST MEDICAL HISTORY: Past Medical History:  Diagnosis Date  . Anxiety   . Asthma   . Bipolar 1 disorder ([place])   . Bulging lumbar disc   . BV (bacterial vaginosis) 02/18/2018  . Cancer of cervix ([place])   . Carpal tunnel syndrome on right   . Cervical cancer ([place])   . Chronic back pain   . COPD (chronic obstructive pulmonary disease) ([place])   . Depression   . Hypertension   . Migraines   . Physiological ovarian cysts   . PTSD (post-traumatic stress disorder)   . Rheumatoid arthritis ([place])   . S/P partial hysterectomy     PAST SURGICAL HISTORY: Past Surgical History:  Procedure Laterality Date  . ABDOMINAL HYSTERECTOMY    . ABDOMINAL HYSTERECTOMY    . 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 ORIF ELBOW FRACTURE Left 11/18/2019   Procedure: OPEN REDUCTION WITH REPAIR VERSUS FRAGMENT EXCISION CAPITELLUM FRACTURE LEFT ELBOW AND LIGAMENTOUS RECONSTRUCTION AS NECESSARY;  Surgeon: Roseanne Kaufman, MD;  Location: [facility];  Service: Orthopedics;  Laterality: Left;  2 HRS    FAMILY HISTORY: Family History  Problem Relation Age of Onset  . Stroke  Mother        TIA  . Asthma Mother   . Cancer Mother   . Heart attack Father   . Hypertension Father   . Cancer Father        skin cancer  . Asthma Daughter   . Sleep apnea Son   . Diabetes Maternal Grandmother   . Heart Problems Maternal Grandmother   . Anuerysm Maternal Grandfather   . Seizures Maternal Grandfather   . Cancer Paternal Grandmother        breast  . Cancer Sister        cervical  . Cancer Sister        cervical  . Cancer Brother  thyroid cancer  . Cancer Maternal Aunt        breast cancer  . Cancer Paternal Aunt     SOCIAL HISTORY: Social History   Socioeconomic History  . Marital status: Divorced    Spouse name: Not on file  . Number of children: 3  . Years of education: 24  . Highest education level: High school graduate  Occupational History  . Occupation: Seeking disability  Tobacco Use  . Smoking status: Never Smoker  . Smokeless tobacco: Never Used  Substance and Sexual Activity  . Alcohol use: No  . Drug use: No  . Sexual activity: Not Currently    Birth control/protection: Surgical    Comment: hyst  Other Topics Concern  . Not on file  Social History Narrative   Lives at home with father.   Right-handed.   No daily caffeine per day.   Social Determinants of Health   Financial Resource Strain:   . Difficulty of Paying Living Expenses:   Food Insecurity:   . Worried About Charity fundraiser in the Last Year:   . Arboriculturist in the Last Year:   Transportation Needs:   . Film/video editor (Medical):   Marland Kitchen Lack of Transportation (Non-Medical):   Physical Activity:   . Days of Exercise per Week:   . Minutes of Exercise per Session:   Stress:   . Feeling of Stress :   Social Connections:   . Frequency of Communication with Friends and Family:   . Frequency of Social Gatherings with Friends and Family:   . Attends Religious Services:   . Active Member of Clubs or Organizations:   . Attends Archivist  Meetings:   Marland Kitchen Marital Status:   Intimate Partner Violence:   . Fear of Current or Ex-Partner:   . Emotionally Abused:   Marland Kitchen Physically Abused:   . Sexually Abused:      PHYSICAL EXAM   Vitals:   12/02/19 1042  BP: 131/83  Pulse: 62  Temp: 97.6 F (36.4 C)  Weight: (!) 316 lb 8 oz (143.6 kg)  Height: 5\' 9"  (1.753 m)    Not recorded      Body mass index is 46.74 kg/m.  PHYSICAL EXAMNIATION:  Gen: NAD, conversant, well nourised, well groomed                     Cardiovascular: Regular rate rhythm, no peripheral edema, warm, nontender. Eyes: Conjunctivae clear without exudates or hemorrhage Neck: Supple, no carotid bruits. Pulmonary: Clear to auscultation bilaterally   NEUROLOGICAL EXAM:  MENTAL STATUS: Speech:    Speech is normal; fluent and spontaneous with normal comprehension.  Cognition:     Orientation to time, place and person     Normal recent and remote memory     Normal Attention span and concentration     Normal Language, naming, repeating,spontaneous speech     Fund of knowledge   CRANIAL NERVES: CN II: Visual fields are full to confrontation.  Right prosthetic eye, left pupil was reactive to light CN III, IV, VI: extraocular movement are normal. No ptosis. CN V: Facial sensation is intact to light touch CN VII: Face is symmetric with normal eye closure  CN VIII: Hearing is normal to causal conversation. CN IX, X: Phonation is normal. CN XI: Head turning and shoulder shrug are intact  MOTOR: Variable effort on examination, felt no significant weakness.  REFLEXES: Reflexes are 2+ and symmetric at  the biceps, triceps, knees, and ankles. Plantar responses are flexor.  SENSORY: Intact to light touch, pinprick and vibratory sensation are intact in fingers and toes.  COORDINATION: There is no trunk or limb dysmetria noted.  GAIT/STANCE: Posture is normal. Gait is steady with normal steps, base, arm swing, and turning. Heel and toe walking are  normal. Tandem gait is normal.  Romberg is absent.   DIAGNOSTIC DATA (LABS, IMAGING, TESTING) - I reviewed patient records, labs, notes, testing and imaging myself where available.   ASSESSMENT AND PLAN  Mary [name] is a 39 y.o. female   Chronic migraine headaches Diffuse body achy pain Intermittent right side weakness  Previous MRI was essentially normal, no evidence of demyelinating disease, patient insisted on further evaluation, will repeat MRI of the brain without contrast,   Laboratory evaluations  Suboptimal response to Imitrex, will try Maxalt as needed  Cymbalta 60 mg daily   Marcial Pacas, M.D. Ph.D.  [facility] [address], [place], [place] [address] Ph: [phone] Fax: [phone]  CC: Referring Provider

## 2019-12-02 NOTE — Telephone Encounter (Signed)
self pay order sent to GI. They will reach out to the patient to schedule.  °

## 2019-12-05 ENCOUNTER — Encounter: Payer: Self-pay | Admitting: Neurology

## 2019-12-15 ENCOUNTER — Ambulatory Visit: Payer: Medicaid Other | Admitting: Physician Assistant

## 2020-01-02 ENCOUNTER — Other Ambulatory Visit: Payer: Self-pay

## 2020-01-02 ENCOUNTER — Ambulatory Visit
Admission: RE | Admit: 2020-01-02 | Discharge: 2020-01-02 | Disposition: A | Discharge status: Home / Self Care | Payer: Medicaid Other | Source: Ambulatory Visit | Attending: Neurology | Admitting: Neurology

## 2020-01-02 DIAGNOSIS — R52 Pain, unspecified: Secondary | ICD-10-CM

## 2020-01-02 DIAGNOSIS — IMO0002 Reserved for concepts with insufficient information to code with codable children: Secondary | ICD-10-CM

## 2020-01-02 DIAGNOSIS — G43709 Chronic migraine without aura, not intractable, without status migrainosus: Secondary | ICD-10-CM

## 2020-01-05 ENCOUNTER — Other Ambulatory Visit: Payer: Self-pay

## 2020-01-05 ENCOUNTER — Telehealth: Payer: Self-pay | Admitting: Neurology

## 2020-01-05 ENCOUNTER — Encounter: Payer: Self-pay | Admitting: Emergency Medicine

## 2020-01-05 ENCOUNTER — Emergency Department: Payer: Medicaid Other

## 2020-01-05 ENCOUNTER — Emergency Department
Admission: EM | Admit: 2020-01-05 | Discharge: 2020-01-05 | Disposition: A | Discharge status: Home / Self Care | Payer: Medicaid Other | Attending: Emergency Medicine | Admitting: Emergency Medicine

## 2020-01-05 DIAGNOSIS — Z8541 Personal history of malignant neoplasm of cervix uteri: Secondary | ICD-10-CM | POA: Insufficient documentation

## 2020-01-05 DIAGNOSIS — R0981 Nasal congestion: Secondary | ICD-10-CM | POA: Insufficient documentation

## 2020-01-05 DIAGNOSIS — J45909 Unspecified asthma, uncomplicated: Secondary | ICD-10-CM | POA: Insufficient documentation

## 2020-01-05 DIAGNOSIS — Z91040 Latex allergy status: Secondary | ICD-10-CM | POA: Insufficient documentation

## 2020-01-05 DIAGNOSIS — I10 Essential (primary) hypertension: Secondary | ICD-10-CM | POA: Insufficient documentation

## 2020-01-05 DIAGNOSIS — Z20822 Contact with and (suspected) exposure to covid-19: Secondary | ICD-10-CM | POA: Insufficient documentation

## 2020-01-05 DIAGNOSIS — R059 Cough, unspecified: Secondary | ICD-10-CM

## 2020-01-05 DIAGNOSIS — J3489 Other specified disorders of nose and nasal sinuses: Secondary | ICD-10-CM | POA: Insufficient documentation

## 2020-01-05 DIAGNOSIS — R05 Cough: Secondary | ICD-10-CM | POA: Insufficient documentation

## 2020-01-05 DIAGNOSIS — R197 Diarrhea, unspecified: Secondary | ICD-10-CM | POA: Insufficient documentation

## 2020-01-05 DIAGNOSIS — Z7982 Long term (current) use of aspirin: Secondary | ICD-10-CM | POA: Insufficient documentation

## 2020-01-05 LAB — URINALYSIS, COMPLETE (UACMP) WITH MICROSCOPIC
Bilirubin Urine: NEGATIVE
Glucose, UA: NEGATIVE mg/dL
Hgb urine dipstick: NEGATIVE
Ketones, ur: NEGATIVE mg/dL
Nitrite: NEGATIVE
Protein, ur: NEGATIVE mg/dL
Specific Gravity, Urine: 1.02 (ref 1.005–1.030)
WBC, UA: >50 WBC/hpf (ref 0–5)
pH: 7 (ref 5.0–8.0)

## 2020-01-05 LAB — SARS CORONAVIRUS 2 BY RT PCR (HOSPITAL ORDER, PERFORMED IN ~~LOC~~ HOSPITAL LAB): SARS Coronavirus 2: NEGATIVE

## 2020-01-05 NOTE — ED Triage Notes (Addendum)
Patient presents to the ED with diarrhea, congestion and cough that began on Wednesday.  Patient states every day since then she has been waking up with symptoms at 3am.  Patient states she gets better throughout the day.  Patient states she has gotten both her covid19 vaccines but her last vaccine was on May 11th, the day before she began to be sick.  Patient reports history of copd but states this feels different.  Patient states she has not had diarrhea in the past 24 hours.  Patient is in no obvious distress at this time.  Speaking in full sentences with no obvious distress.  Patient states she has been able to drink water and keep it down.  Patient states she ate dinner last night but has not felt up to eating food today.  Patient states she is allergic to cats and has been visiting her cousin recently due to a death in the family and states her cousin has a lot of cats and she has had to be around them.

## 2020-01-05 NOTE — Telephone Encounter (Signed)
I spoke to the patient and her mother to review the MRI results below. They verbalized understanding. She already has an appt scheduled in two weeks with her ophthalmologist, Dr. Benjamine Mola at [facility].

## 2020-01-05 NOTE — ED Notes (Signed)
See triage note  Presents with cough,congestion and diarrhea  sxs' started last weds  Afebrile on arrival

## 2020-01-05 NOTE — Telephone Encounter (Signed)
I spoke to the patient but she was at another doctor's appt and will have to call me back.

## 2020-01-05 NOTE — Telephone Encounter (Signed)
Please call patient, MRI of the brain showed no significant abnormality, pituitary gland is mildly thin with enlarged sellar turcica.  Please advise patient to see her ophthalmologist to rule out pseudo- tumor cerebri   IMPRESSION: This MRI of the brain without contrast shows the following: 1.    Brain parenchyma appears normal. 2.    The pituitary gland is mildly thinned within an enlarged sella turcica.  Additionally the optic nerve sheaths are widened.  These could be incidental findings but could also be due to elevated intracranial pressure. 3.    Prosthetic right eye.

## 2020-01-05 NOTE — ED Provider Notes (Signed)
Emergency Department Provider Note  ____________________________________________  Time seen: Approximately 6:29 PM  I have reviewed the triage vital signs and the nursing notes.   HISTORY  Chief Complaint Diarrhea, Cough, and Nasal Congestion   Historian Patient    HPI Mary [name] is a 39 y.o. female presents to the emergency department with 2 to 3 days of nonproductive cough and diarrhea.  Patient has had some rhinorrhea and nasal congestion but states that she has similar allergies seasonally.  Patient states that she recently finished her own COVID-19 vaccination series.  She denies chest pain, chest tightness, shortness of breath, nausea, vomiting or fever.  No recent travel.   Past Medical History:  Diagnosis Date  . Anxiety   . Asthma   . Bipolar 1 disorder ([place])   . Bulging lumbar disc   . BV (bacterial vaginosis) 02/18/2018  . Cancer of cervix ([place])   . Carpal tunnel syndrome on right   . Cervical cancer ([place])   . Chronic back pain   . COPD (chronic obstructive pulmonary disease) ([place])   . Depression   . Hypertension   . Migraines   . Physiological ovarian cysts   . PTSD (post-traumatic stress disorder)   . Rheumatoid arthritis ([place])   . S/P partial hysterectomy      Immunizations up to date:  Yes.     Past Medical History:  Diagnosis Date  . Anxiety   . Asthma   . Bipolar 1 disorder ([place])   . Bulging lumbar disc   . BV (bacterial vaginosis) 02/18/2018  . Cancer of cervix ([place])   . Carpal tunnel syndrome on right   . Cervical cancer ([place])   . Chronic back pain   . COPD (chronic obstructive pulmonary disease) ([place])   . Depression   . Hypertension   . Migraines   . Physiological ovarian cysts   . PTSD (post-traumatic stress disorder)   . Rheumatoid arthritis ([place])   . S/P partial hysterectomy     Patient Active Problem List   Diagnosis Date Noted  . Pain of right side of body 12/02/2019  . Chronic migraine 12/02/2019  . BV (bacterial  vaginosis) 02/18/2018  . Screening examination for STD (sexually transmitted disease) 02/06/2018  . Family planning 02/06/2018  . History of cervical cancer 02/06/2018  . Vaginal Pap smear following hysterectomy for malignancy 02/06/2018  . Encounter for well woman exam with routine gynecological exam 02/06/2018  . Depression 02/06/2018  . Tenderness of female pelvic organs 02/06/2018  . Cancer of cervix ([place])   . Right ovarian cyst 03/19/2014  . Trichimoniasis 03/19/2014  . DYSPNEA 11/26/2009  . ASTHMA 11/23/2009    Past Surgical History:  Procedure Laterality Date  . ABDOMINAL HYSTERECTOMY    . ABDOMINAL HYSTERECTOMY    . EYE SURGERY  age 64   Artificial right eye  . ORIF ELBOW FRACTURE Left 11/18/2019   Procedure: OPEN REDUCTION WITH REPAIR VERSUS FRAGMENT EXCISION CAPITELLUM FRACTURE LEFT ELBOW AND LIGAMENTOUS RECONSTRUCTION AS NECESSARY;  Surgeon: Roseanne Kaufman, MD;  Location: [facility];  Service: Orthopedics;  Laterality: Left;  2 HRS    Prior to Admission medications   Medication Sig Start Date End Date Taking? Authorizing Provider  albuterol (VENTOLIN HFA) 108 (90 Base) MCG/ACT inhaler Inhale 2 puffs into the lungs every 6 (six) hours as needed for wheezing or shortness of breath. 10/16/19   Soyla Dryer, PA-C  ALPRAZolam Duanne Moron) 0.5 MG tablet Take 0.5 mg by mouth at bedtime.  [provider]  aspirin 81 MG chewable tablet Chew 81 mg by mouth in the morning.     [provider]  cholecalciferol (VITAMIN D3) 25 MCG (1000 UNIT) tablet Take 1,000 Units by mouth daily.    [provider]  DULoxetine (CYMBALTA) 60 MG capsule Take 1 capsule (60 mg total) by mouth daily. 12/02/19   Marcial Pacas, MD  furosemide (LASIX) 40 MG tablet Take 40 mg by mouth daily.    [provider]  losartan (COZAAR) 50 MG tablet Take 1 tablet (50 mg total) by mouth daily. 10/16/19   Soyla Dryer, PA-C  methocarbamol (ROBAXIN) 500 MG tablet Take 1 tablet (500 mg  total) by mouth 3 (three) times daily. 10/30/19   Triplett, Tammy, PA-C  mometasone-formoterol (DULERA) 100-5 MCG/ACT AERO Inhale 2 puffs into the lungs 2 (two) times daily. 10/16/19   Soyla Dryer, PA-C  naproxen (NAPROSYN) 500 MG tablet Take 1 tablet (500 mg total) by mouth 2 (two) times daily with a meal. 10/30/19   Triplett, Tammy, PA-C  ondansetron (ZOFRAN) 4 MG tablet Take 1 tablet (4 mg total) by mouth every 8 (eight) hours as needed for nausea or vomiting. Patient taking differently: Take 4 mg by mouth every 4 (four) hours as needed for nausea or vomiting.  05/05/18   Evalee Jefferson, PA-C  oxycodone (OXY-IR) 5 MG capsule Take 5-10 mg by mouth every 8 (eight) hours as needed for pain.     [provider]  rizatriptan (MAXALT-MLT) 10 MG disintegrating tablet Take 1 tablet (10 mg total) by mouth as needed. May repeat in 2 hours if needed 12/02/19   Marcial Pacas, MD  SUMAtriptan (IMITREX) 50 MG tablet Take 50 mg by mouth as needed for migraine. May repeat in 2 hours if headache persists or recurs.    [provider]  vitamin B-12 (CYANOCOBALAMIN) 50 MCG tablet Take 50 mcg by mouth daily.    [provider]    Allergies Buprenorphine hcl, Morphine and related, Penicillins, Gabapentin, Ibuprofen & acetaminophen, Latex, and Tylenol [acetaminophen]  Family History  Problem Relation Age of Onset  . Stroke Mother        TIA  . Asthma Mother   . Cancer Mother   . Heart attack Father   . Hypertension Father   . Cancer Father        skin cancer  . Asthma Daughter   . Sleep apnea Son   . Diabetes Maternal Grandmother   . Heart Problems Maternal Grandmother   . Anuerysm Maternal Grandfather   . Seizures Maternal Grandfather   . Cancer Paternal Grandmother        breast  . Cancer Sister        cervical  . Cancer Sister        cervical  . Cancer Brother        thyroid cancer  . Cancer Maternal Aunt        breast cancer  . Cancer Paternal Aunt     Social  History Social History   Tobacco Use  . Smoking status: Never Smoker  . Smokeless tobacco: Never Used  Substance Use Topics  . Alcohol use: No  . Drug use: No     Review of Systems  Constitutional: No fever/chills Eyes:  No discharge ENT: No upper respiratory complaints. Respiratory: Patient has cough.  Gastrointestinal: Patient has diarrhea.  Musculoskeletal: Negative for musculoskeletal pain. Skin: Negative for rash, abrasions, lacerations, ecchymosis.    ____________________________________________   PHYSICAL  EXAM:  VITAL SIGNS: ED Triage Vitals  Enc Vitals Group     BP 01/05/20 1335 (!) 144/92     Pulse Rate 01/05/20 1335 74     Resp 01/05/20 1335 16     Temp 01/05/20 1335 97.9 F (36.6 C)     Temp Source 01/05/20 1335 Oral     SpO2 01/05/20 1335 97 %     Weight 01/05/20 1337 280 lb (127 kg)     Height 01/05/20 1337 5\' 9"  (1.753 m)     Head Circumference --      Peak Flow --      Pain Score 01/05/20 1335 6     Pain Loc --      Pain Edu? --      Excl. in [place]? --      Constitutional: Alert and oriented. Well appearing and in no acute distress. Eyes: Conjunctivae are normal. PERRL. EOMI. Head: Atraumatic. ENT:      Ears:       Nose: Patient has nasal congestion and rhinorrhea.      Mouth/Throat: Mucous membranes are moist.  Neck: No stridor.  No cervical spine tenderness to palpation. Cardiovascular: Normal rate, regular rhythm. Normal S1 and S2.  Good peripheral circulation. Respiratory: Normal respiratory effort without tachypnea or retractions. Lungs CTAB. Good air entry to the bases with no decreased or absent breath sounds Gastrointestinal: Bowel sounds x 4 quadrants. Soft and nontender to palpation. No guarding or rigidity. No distention. Musculoskeletal: Full range of motion to all extremities. No obvious deformities noted Neurologic:  Normal for age. No gross focal neurologic deficits are appreciated.  Skin:  Skin is warm, dry and intact. No rash  noted. Psychiatric: Mood and affect are normal for age. Speech and behavior are normal.   ____________________________________________   LABS (all labs ordered are listed, but only abnormal results are displayed)  Labs Reviewed  URINALYSIS, COMPLETE (UACMP) WITH MICROSCOPIC - Abnormal; Notable for the following components:      Result Value   Color, Urine YELLOW (*)    APPearance CLOUDY (*)    Leukocytes,Ua LARGE (*)    Bacteria, UA RARE (*)    All other components within normal limits  SARS CORONAVIRUS 2 BY RT PCR (HOSPITAL ORDER, [facility])   ____________________________________________  EKG   ____________________________________________  RADIOLOGY Unk Pinto, personally viewed and evaluated these images (plain radiographs) as part of my medical decision making, as well as reviewing the written report by the radiologist.  DG Chest 1 View  Result Date: 01/05/2020 CLINICAL DATA:  Cough. Additional history provided: Patient reports cough with congestion diarrhea, symptoms started last Wednesday. Afebrile. History of asthma, COPD and hypertension. EXAM: CHEST  1 VIEW COMPARISON:  Chest CT 11/07/2019, chest radiograph 03/21/2018 FINDINGS: Heart size at the upper limits of normal, unchanged. No appreciable airspace consolidation within the lungs. No evidence of pleural effusion or pneumothorax. No acute bony abnormality identified. IMPRESSION: No evidence of acute cardiopulmonary abnormality. Electronically Signed   By: Kellie Simmering DO   On: 01/05/2020 15:42    ____________________________________________    PROCEDURES  Procedure(s) performed:     Procedures     Medications - No data to display   ____________________________________________   INITIAL IMPRESSION / ASSESSMENT AND PLAN / ED COURSE  Pertinent labs & imaging results that were available during my care of the patient were reviewed by me and considered in my medical  decision making (see chart for details).  Clinical Course as of Jan 05 1828  Mon Jan 05, 2020  1725 Leukocytes,Ua(!): LARGE [JW]    Clinical Course User Index [JW] Vallarie Mare M, [place]     Assessment and plan Congestion Diarrhea Cough 39 year old female presents to the emergency department with 2 to 3 days of nonproductive cough and diarrhea.  Chest x-ray revealed no consolidations, opacities or infiltrates.  COVID-19 testing was negative.  Unspecified viral infection is likely at this time.  Rest and hydration were encouraged at home.  Return precautions were given to return with new or worsening symptoms.   ____________________________________________  FINAL CLINICAL IMPRESSION(S) / ED DIAGNOSES  Final diagnoses:  Diarrhea, unspecified type  Cough      NEW MEDICATIONS STARTED DURING THIS VISIT:  ED Discharge Orders    None          This chart was dictated using voice recognition software/Dragon. Despite best efforts to proofread, errors can occur which can change the meaning. Any change was purely unintentional.     Lannie Fields, PA-C 01/05/20 1833    Carrie Mew, MD 01/05/20 2050

## 2020-01-29 DIAGNOSIS — Z0289 Encounter for other administrative examinations: Secondary | ICD-10-CM

## 2020-03-01 ENCOUNTER — Ambulatory Visit: Payer: Medicaid Other | Admitting: Physician Assistant

## 2020-06-02 DIAGNOSIS — Z20822 Contact with and (suspected) exposure to covid-19: Secondary | ICD-10-CM | POA: Diagnosis not present

## 2020-06-02 DIAGNOSIS — J029 Acute pharyngitis, unspecified: Secondary | ICD-10-CM | POA: Diagnosis not present

## 2020-06-02 DIAGNOSIS — R0981 Nasal congestion: Secondary | ICD-10-CM | POA: Diagnosis not present

## 2020-06-02 DIAGNOSIS — R519 Headache, unspecified: Secondary | ICD-10-CM | POA: Diagnosis not present

## 2020-06-07 ENCOUNTER — Ambulatory Visit: Payer: Medicaid Other | Admitting: Neurology

## 2020-06-07 ENCOUNTER — Encounter: Payer: Self-pay | Admitting: Neurology

## 2020-06-07 NOTE — Progress Notes (Deleted)
PATIENT: Mary [name] DOB: [birth year]  REASON FOR VISIT: follow up HISTORY FROM: patient  HISTORY OF PRESENT ILLNESS: Today 06/07/20  HISTORY Mary [name] is a 39 year old female, seen in request by her primary care PA Soyla Dryer for evaluation of migraine headaches, right side weakness.  Initial evaluation was on December 02, 2019.  I have reviewed and summarized the referring note from the referring physician.  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  Has history of chronic migraine headaches, and also chronic body achy pain, under pain management, was treated with Percocet for many years, now she is receiving oxycodone prescription from her primary care doctor.  She reported a history of migraine since age 57s, tends to stay at the right retro-orbital area, severe pounding headache with associated light, noise sensitivity, lasting for hours to days, she was having it couple times each week, over-the-counter NSAIDs, does not help, Imitrex provide partial relief, she has not tried other triptans in the past.  She also complains of intermittent right-sided weakness since 2011, felt like it has progressed since then, with some limitation in her daily activity.  She worried about possibility of multiple sclerosis,  I personally reviewed MRI of the brain without contrast in July 2015 that was normal  Laboratory evaluations in 2021, normal CBC, CMP, lipid panel, TSH, A1c 4.7  Update June 07, 2020 SS:   REVIEW OF SYSTEMS: Out of a complete 14 system review of symptoms, the patient complains only of the following symptoms, and all other reviewed systems are negative.  ALLERGIES: Allergies  Allergen Reactions  . Buprenorphine Hcl Anaphylaxis    "it stops my heart"  . Morphine And Related Anaphylaxis    "it stops my heart"  . Penicillins Anaphylaxis and Other (See Comments)    Has patient had a PCN reaction causing immediate rash,  facial/tongue/throat swelling, SOB or lightheadedness with hypotension: Yes Has patient had a PCN reaction causing severe rash involving mucus membranes or skin necrosis: Yes Has patient had a PCN reaction that required hospitalization: Yes Has patient had a PCN reaction occurring within the last 10 years: No If all of the above answers are "NO", then may proceed with Cephalosporin use.   . Gabapentin Other (See Comments)    Per patient "hallucination"  . Ibuprofen & Acetaminophen Nausea And Vomiting  . Latex Rash  . Tylenol [Acetaminophen] Nausea And Vomiting    HOME MEDICATIONS: Outpatient Medications Prior to Visit  Medication Sig Dispense Refill  . albuterol (VENTOLIN HFA) 108 (90 Base) MCG/ACT inhaler Inhale 2 puffs into the lungs every 6 (six) hours as needed for wheezing or shortness of breath. 18 g 1  . ALPRAZolam (XANAX) 0.5 MG tablet Take 0.5 mg by mouth at bedtime.     Marland Kitchen aspirin 81 MG chewable tablet Chew 81 mg by mouth in the morning.     . cholecalciferol (VITAMIN D3) 25 MCG (1000 UNIT) tablet Take 1,000 Units by mouth daily.    . DULoxetine (CYMBALTA) 60 MG capsule Take 1 capsule (60 mg total) by mouth daily. 30 capsule 11  . furosemide (LASIX) 40 MG tablet Take 40 mg by mouth daily.    Marland Kitchen losartan (COZAAR) 50 MG tablet Take 1 tablet (50 mg total) by mouth daily. 90 tablet 1  . methocarbamol (ROBAXIN) 500 MG tablet Take 1 tablet (500 mg total) by mouth 3 (three) times daily. 21 tablet 0  . mometasone-formoterol (DULERA) 100-5 MCG/ACT AERO Inhale 2 puffs  into the lungs 2 (two) times daily. 8.8 g 1  . naproxen (NAPROSYN) 500 MG tablet Take 1 tablet (500 mg total) by mouth 2 (two) times daily with a meal. 20 tablet 0  . ondansetron (ZOFRAN) 4 MG tablet Take 1 tablet (4 mg total) by mouth every 8 (eight) hours as needed for nausea or vomiting. (Patient taking differently: Take 4 mg by mouth every 4 (four) hours as needed for nausea or vomiting. ) 4 tablet 0  . oxycodone (OXY-IR) 5  MG capsule Take 5-10 mg by mouth every 8 (eight) hours as needed for pain.     . rizatriptan (MAXALT-MLT) 10 MG disintegrating tablet Take 1 tablet (10 mg total) by mouth as needed. May repeat in 2 hours if needed 12 tablet 6  . SUMAtriptan (IMITREX) 50 MG tablet Take 50 mg by mouth as needed for migraine. May repeat in 2 hours if headache persists or recurs.    . vitamin B-12 (CYANOCOBALAMIN) 50 MCG tablet Take 50 mcg by mouth daily.     No facility-administered medications prior to visit.    PAST MEDICAL HISTORY: Past Medical History:  Diagnosis Date  . Anxiety   . Asthma   . Bipolar 1 disorder ([place])   . Bulging lumbar disc   . BV (bacterial vaginosis) 02/18/2018  . Cancer of cervix ([place])   . Carpal tunnel syndrome on right   . Cervical cancer ([place])   . Chronic back pain   . COPD (chronic obstructive pulmonary disease) ([place])   . Depression   . Hypertension   . Migraines   . Physiological ovarian cysts   . PTSD (post-traumatic stress disorder)   . Rheumatoid arthritis ([place])   . S/P partial hysterectomy     PAST SURGICAL HISTORY: Past Surgical History:  Procedure Laterality Date  . ABDOMINAL HYSTERECTOMY    . ABDOMINAL HYSTERECTOMY    . EYE SURGERY  age 65   Artificial right eye  . ORIF ELBOW FRACTURE Left 11/18/2019   Procedure: OPEN REDUCTION WITH REPAIR VERSUS FRAGMENT EXCISION CAPITELLUM FRACTURE LEFT ELBOW AND LIGAMENTOUS RECONSTRUCTION AS NECESSARY;  Surgeon: Roseanne Kaufman, MD;  Location: [facility];  Service: Orthopedics;  Laterality: Left;  2 HRS    FAMILY HISTORY: Family History  Problem Relation Age of Onset  . Stroke Mother        TIA  . Asthma Mother   . Cancer Mother   . Heart attack Father   . Hypertension Father   . Cancer Father        skin cancer  . Asthma Daughter   . Sleep apnea Son   . Diabetes Maternal Grandmother   . Heart Problems Maternal Grandmother   . Anuerysm Maternal Grandfather   . Seizures Maternal Grandfather   . Cancer Paternal  Grandmother        breast  . Cancer Sister        cervical  . Cancer Sister        cervical  . Cancer Brother        thyroid cancer  . Cancer Maternal Aunt        breast cancer  . Cancer Paternal Aunt     SOCIAL HISTORY: Social History   Socioeconomic History  . Marital status: Divorced    Spouse name: Not on file  . Number of children: 3  . Years of education: 72  . Highest education level: High school graduate  Occupational History  . Occupation: Seeking disability  Tobacco Use  .  Smoking status: Never Smoker  . Smokeless tobacco: Never Used  Vaping Use  . Vaping Use: Never used  Substance and Sexual Activity  . Alcohol use: No  . Drug use: No  . Sexual activity: Not Currently    Birth control/protection: Surgical    Comment: hyst  Other Topics Concern  . Not on file  Social History Narrative   Lives at home with father.   Right-handed.   No daily caffeine per day.   Social Determinants of Health   Financial Resource Strain:   . Difficulty of Paying Living Expenses: Not on file  Food Insecurity:   . Worried About Charity fundraiser in the Last Year: Not on file  . Ran Out of Food in the Last Year: Not on file  Transportation Needs:   . Lack of Transportation (Medical): Not on file  . Lack of Transportation (Non-Medical): Not on file  Physical Activity:   . Days of Exercise per Week: Not on file  . Minutes of Exercise per Session: Not on file  Stress:   . Feeling of Stress : Not on file  Social Connections:   . Frequency of Communication with Friends and Family: Not on file  . Frequency of Social Gatherings with Friends and Family: Not on file  . Attends Religious Services: Not on file  . Active Member of Clubs or Organizations: Not on file  . Attends Archivist Meetings: Not on file  . Marital Status: Not on file  Intimate Partner Violence:   . Fear of Current or Ex-Partner: Not on file  . Emotionally Abused: Not on file  . Physically  Abused: Not on file  . Sexually Abused: Not on file      PHYSICAL EXAM  There were no vitals filed for this visit. There is no height or weight on file to calculate BMI.  Generalized: Well developed, in no acute distress   Neurological examination  Mentation: Alert oriented to time, place, history taking. Follows all commands speech and language fluent Cranial nerve II-XII: Pupils were equal round reactive to light. Extraocular movements were full, visual field were full on confrontational test. Facial sensation and strength were normal. Uvula tongue midline. Head turning and shoulder shrug  were normal and symmetric. Motor: The motor testing reveals 5 over 5 strength of all 4 extremities. Good symmetric motor tone is noted throughout.  Sensory: Sensory testing is intact to soft touch on all 4 extremities. No evidence of extinction is noted.  Coordination: Cerebellar testing reveals good finger-nose-finger and heel-to-shin bilaterally.  Gait and station: Gait is normal. Tandem gait is normal. Romberg is negative. No drift is seen.  Reflexes: Deep tendon reflexes are symmetric and normal bilaterally.   DIAGNOSTIC DATA (LABS, IMAGING, TESTING) - I reviewed patient records, labs, notes, testing and imaging myself where available.  Lab Results  Component Value Date   WBC 7.8 11/18/2019   HGB 12.3 11/18/2019   HCT 38.3 11/18/2019   MCV 92.1 11/18/2019   PLT 210 11/18/2019      Component Value Date/Time   NA 138 11/18/2019 0952   NA 135 (L) 09/15/2013 1913   K 4.0 11/18/2019 0952   K 4.1 09/15/2013 1913   CL 103 11/18/2019 0952   CL 103 09/15/2013 1913   CO2 25 11/18/2019 0952   CO2 29 09/15/2013 1913   GLUCOSE 85 11/18/2019 0952   GLUCOSE 80 09/15/2013 1913   BUN 10 11/18/2019 0952   BUN 7 09/15/2013 1913  CREATININE 1.00 11/18/2019 0952   CREATININE 0.85 09/15/2013 1913   CALCIUM 8.9 11/18/2019 0952   CALCIUM 9.6 09/15/2013 1913   PROT 7.0 10/17/2019 0842   PROT 8.2  09/15/2013 1913   ALBUMIN 4.0 10/17/2019 0842   ALBUMIN 4.4 09/15/2013 1913   AST 18 10/17/2019 0842   AST 28 09/15/2013 1913   ALT 22 10/17/2019 0842   ALT 26 09/15/2013 1913   ALKPHOS 84 10/17/2019 0842   ALKPHOS 97 09/15/2013 1913   BILITOT 0.8 10/17/2019 0842   BILITOT 0.7 09/15/2013 1913   GFRNONAA >60 11/18/2019 0952   GFRNONAA >60 09/15/2013 1913   GFRAA >60 11/18/2019 0952   GFRAA >60 09/15/2013 1913   Lab Results  Component Value Date   CHOL 145 10/17/2019   HDL 52 10/17/2019   LDLCALC 83 10/17/2019   TRIG 50 10/17/2019   CHOLHDL 2.8 10/17/2019   Lab Results  Component Value Date   HGBA1C 4.7 (L) 10/17/2019   No results found for: [id number] Lab Results  Component Value Date   TSH 2.174 10/17/2019      ASSESSMENT AND PLAN 39 y.o. year old female  has a past medical history of Anxiety, Asthma, Bipolar 1 disorder ([place]), Bulging lumbar disc, BV (bacterial vaginosis) (02/18/2018), Cancer of cervix ([place]), Carpal tunnel syndrome on right, Cervical cancer ([place]), Chronic back pain, COPD (chronic obstructive pulmonary disease) ([place]), Depression, Hypertension, Migraines, Physiological ovarian cysts, PTSD (post-traumatic stress disorder), Rheumatoid arthritis ([place]), and S/P partial hysterectomy. here with:  1.  Chronic migraine headaches 2.  Diffuse body achy pain 3.  Intermittent right-sided weakness -MRI of the brain without contrast showed no significant abnormality, the pituitary gland is mildly thin with enlarged sella turcica  I spent 15 minutes with the patient. 50% of this time was spent   Butler Denmark, Roy, DNP 06/07/2020, 5:50 AM [facility] [address], [place], [place] [address] [phone]

## 2020-06-11 ENCOUNTER — Encounter: Payer: Self-pay | Admitting: Adult Health

## 2020-06-11 ENCOUNTER — Ambulatory Visit (INDEPENDENT_AMBULATORY_CARE_PROVIDER_SITE_OTHER): Payer: Self-pay | Admitting: Adult Health

## 2020-06-11 ENCOUNTER — Other Ambulatory Visit (HOSPITAL_COMMUNITY)
Admission: RE | Admit: 2020-06-11 | Discharge: 2020-06-11 | Disposition: A | Discharge status: Home / Self Care | Payer: Medicaid Other | Source: Ambulatory Visit | Attending: Adult Health | Admitting: Adult Health

## 2020-06-11 ENCOUNTER — Other Ambulatory Visit: Payer: Self-pay

## 2020-06-11 VITALS — BP 146/95 | HR 66 | Ht 67.25 in | Wt 310.5 lb

## 2020-06-11 DIAGNOSIS — Z01419 Encounter for gynecological examination (general) (routine) without abnormal findings: Secondary | ICD-10-CM | POA: Diagnosis not present

## 2020-06-11 DIAGNOSIS — Z113 Encounter for screening for infections with a predominantly sexual mode of transmission: Secondary | ICD-10-CM

## 2020-06-11 DIAGNOSIS — Z08 Encounter for follow-up examination after completed treatment for malignant neoplasm: Secondary | ICD-10-CM

## 2020-06-11 DIAGNOSIS — R635 Abnormal weight gain: Secondary | ICD-10-CM

## 2020-06-11 DIAGNOSIS — Z90710 Acquired absence of both cervix and uterus: Secondary | ICD-10-CM

## 2020-06-11 DIAGNOSIS — Z1211 Encounter for screening for malignant neoplasm of colon: Secondary | ICD-10-CM

## 2020-06-11 DIAGNOSIS — F32A Depression, unspecified: Secondary | ICD-10-CM

## 2020-06-11 DIAGNOSIS — R1031 Right lower quadrant pain: Secondary | ICD-10-CM

## 2020-06-11 DIAGNOSIS — I10 Essential (primary) hypertension: Secondary | ICD-10-CM

## 2020-06-11 LAB — HEMOCCULT GUIAC POC 1CARD (OFFICE): Fecal Occult Blood, POC: NEGATIVE

## 2020-06-11 MED ORDER — LOSARTAN POTASSIUM 50 MG PO TABS: 50.0000 mg | ORAL_TABLET | Freq: Every day | ORAL | 1 refills | Status: DC

## 2020-06-11 NOTE — Progress Notes (Signed)
Patient ID: Mary [name], female   DOB: [birth year], 39 y.o.   MRN: [id number] History of Present Illness:  Mary [name] is a 39 year old white female,divorced, [id number] in for a well woman gyn exam and she wants a pap, she is sp hysterotomy for cervical cancer. She is complaining of RLQ and pain with sex, weight gain and is depressed. She has some constipation and urinary incontinence at times.She says if lifts heavy objects she will having rectal bleeding.  She says feet and legs hurt due to increased weight.  She has Family planning Medicaid and is aware it will not cover visit and she still wants to have it. Will be self pay.  She is trying to get disability. PCP is [facility].  Current Medications, Allergies, Past Medical History, Past Surgical History, Family History and Social History were reviewed in [employer] record.     Review of Systems: Patient denies any headaches, hearing loss, fatigue, blurred vision, shortness of breath,or chest pain. No joint pain. See HPI for positives.    Physical Exam:BP (!) 146/95 (BP Location: Right Arm, Patient Position: Sitting, Cuff Size: Large)   Pulse 66   Ht 5' 7.25" (1.708 m)   Wt (!) 310 lb 8 oz (140.8 kg)   BMI 48.27 kg/m  she is not taking BP meds. General:  Well developed, well nourished, no acute distress Skin:  Warm and dry Neck:  Midline trachea, normal thyroid, good ROM, no lymphadenopathy Lungs; Clear to auscultation bilaterally Breast:  No dominant palpable mass, retraction, or nipple discharge,but the right one was tender on exam Cardiovascular: Regular rate and rhythm Abdomen:  Soft, non tender, no hepatosplenomegaly.obese Pelvic:  External genitalia is normal in appearance, no lesions.  The vagina is normal in appearance. Urethra has no lesions or masses. The cervix and uterus are absent, vaginal pap with GC/CHL and high risk HPV performed.  No adnexal masses,+RLQ tenderness noted.Bladder is non tender, no  masses felt. Rectal: Good sphincter tone, no polyps, or hemorrhoids felt.  Hemoccult negative. Extremities/musculoskeletal:  No swelling or varicosities noted, no clubbing or cyanosis Psych:  No mood changes, alert and cooperative,seems happy AA is 0 Fall risk is moderate PHQ 9 score is 24, she had though she would be better off not here but denies a plan and would not do anything because of her kids.She has appt with [facility], she used to take Cymbalta but stopped about 4 months ago.   Impression and Plan: 1. Well woman exam with routine gynecological exam Pap sent Physical in 1 year Get mammogram now and yearly  Check CBC,CMP,TSH and lipids,   2. Vaginal Pap smear following hysterectomy for malignancy Pap sent  3. Encounter for screening fecal occult blood testing  4. RLQ abdominal pain Pelvic US 10/28 at 3:30 pm at [facility]  5. Depression, unspecified depression type Keep appt with [facility] 6. Weight gain  7. Morbid obesity ([place]) Discussed getting gastric by pass   8. Hypertension, unspecified type Refilled cozaar Meds ordered this encounter  Medications  . losartan (COZAAR) 50 MG tablet    Sig: Take 1 tablet (50 mg total) by mouth daily.    Dispense:  90 tablet    Refill:  1    Order Specific Question:   Supervising Provider    Answer:   [name], Mary H [[id number]]    9. Screening examination for STD (sexually transmitted disease) Check HIV at her request

## 2020-06-16 LAB — CYTOLOGY - PAP
Chlamydia: NEGATIVE
Comment: NEGATIVE
Comment: NEGATIVE
Comment: NORMAL
Diagnosis: NEGATIVE
High risk HPV: NEGATIVE
Neisseria Gonorrhea: NEGATIVE

## 2020-06-17 ENCOUNTER — Other Ambulatory Visit: Payer: Self-pay

## 2020-06-17 ENCOUNTER — Ambulatory Visit (HOSPITAL_COMMUNITY)
Admission: RE | Admit: 2020-06-17 | Discharge: 2020-06-17 | Disposition: A | Discharge status: Home / Self Care | Payer: Medicaid Other | Source: Ambulatory Visit | Attending: Adult Health | Admitting: Adult Health

## 2020-06-17 DIAGNOSIS — R1031 Right lower quadrant pain: Secondary | ICD-10-CM

## 2020-06-21 ENCOUNTER — Other Ambulatory Visit: Payer: Self-pay | Admitting: Women's Health

## 2020-06-21 ENCOUNTER — Telehealth: Payer: Self-pay | Admitting: *Deleted

## 2020-06-21 MED ORDER — METRONIDAZOLE 500 MG PO TABS: 500.0000 mg | ORAL_TABLET | Freq: Two times a day (BID) | ORAL | 0 refills | Status: DC

## 2020-06-21 NOTE — Telephone Encounter (Signed)
Patient informed pap negative but did show trichomonas.  Medication has been sent to pharmacy. No sex or alcohol while taking medication and will need POC visit in 3-4 weeks.  Pt verbalized understanding.

## 2020-06-21 NOTE — Telephone Encounter (Signed)
Attempted to call patient to inform of +trich on pap. Need to know partner info for treatment if desired. (or can go to pcp or health department). No answer left vm to call back.

## 2020-06-23 ENCOUNTER — Telehealth: Payer: Self-pay | Admitting: *Deleted

## 2020-06-23 NOTE — Telephone Encounter (Signed)
-----   Message from [name], [place] sent at 06/22/2020  5:03 PM EDT ----- Will you let her know Dr. Elonda Husky reviewed her pelvic u/s and is normal, no further f/u needed. Mary [name]

## 2020-06-23 NOTE — Telephone Encounter (Signed)
Attempted to call patient with [place] results. Left voicemail to call back for results.

## 2020-07-19 ENCOUNTER — Other Ambulatory Visit: Payer: Self-pay

## 2020-07-19 ENCOUNTER — Other Ambulatory Visit (INDEPENDENT_AMBULATORY_CARE_PROVIDER_SITE_OTHER): Payer: Medicaid Other | Admitting: *Deleted

## 2020-07-19 DIAGNOSIS — Z8619 Personal history of other infectious and parasitic diseases: Secondary | ICD-10-CM

## 2020-07-19 NOTE — Progress Notes (Addendum)
   NURSE VISIT- VAGINITIS/STD/POC  SUBJECTIVE:  Mary [name] is a 39 y.o. [phone] GYN patientfemale here for a vaginal swab for proof of cure after treatment for Trichomonas.  She reports the following symptoms: none for 0 days. Denies abnormal vaginal bleeding, significant pelvic pain, fever, or UTI symptoms.  OBJECTIVE:  There were no vitals taken for this visit.  Appears well, in no apparent distress  ASSESSMENT: Urine sent for proof of cure after treatment for trich  PLAN: Self-collected vaginal probe for Trichomonas sent to lab Treatment: to be determined once results are received Follow-up as needed if symptoms persist/worsen, or new symptoms develop  Janece Canterbury  07/19/2020 10:10 AM   Chart reviewed for nurse visit. Agree with plan of care.  [name], CNM 07/19/2020 10:19 AM

## 2020-07-23 LAB — SPECIMEN STATUS REPORT

## 2020-07-23 LAB — TRICHOMONAS VAGINALIS, PROBE AMP: Trich vag by NAA: POSITIVE — AB

## 2020-07-29 ENCOUNTER — Other Ambulatory Visit: Payer: Self-pay | Admitting: Advanced Practice Midwife

## 2020-07-29 MED ORDER — METRONIDAZOLE 500 MG PO TABS: 500.0000 mg | ORAL_TABLET | Freq: Two times a day (BID) | ORAL | 0 refills | Status: DC

## 2020-08-02 ENCOUNTER — Telehealth: Payer: Self-pay | Admitting: *Deleted

## 2020-08-02 NOTE — Telephone Encounter (Signed)
Pt informed of +trich. Scheduled for POC on 08/25/19 and also to discuss weight gain.

## 2020-08-24 ENCOUNTER — Ambulatory Visit: Payer: Medicaid Other | Admitting: Adult Health

## 2020-09-05 ENCOUNTER — Emergency Department (HOSPITAL_COMMUNITY): Payer: Medicaid Other

## 2020-09-05 ENCOUNTER — Other Ambulatory Visit: Payer: Self-pay

## 2020-09-05 ENCOUNTER — Emergency Department (HOSPITAL_COMMUNITY)
Admission: EM | Admit: 2020-09-05 | Discharge: 2020-09-05 | Disposition: A | Discharge status: Home / Self Care | Payer: Medicaid Other | Attending: Emergency Medicine | Admitting: Emergency Medicine

## 2020-09-05 ENCOUNTER — Encounter (HOSPITAL_COMMUNITY): Payer: Self-pay | Admitting: Emergency Medicine

## 2020-09-05 DIAGNOSIS — Z91040 Latex allergy status: Secondary | ICD-10-CM | POA: Diagnosis not present

## 2020-09-05 DIAGNOSIS — I10 Essential (primary) hypertension: Secondary | ICD-10-CM | POA: Diagnosis not present

## 2020-09-05 DIAGNOSIS — R079 Chest pain, unspecified: Secondary | ICD-10-CM | POA: Diagnosis not present

## 2020-09-05 DIAGNOSIS — Z7982 Long term (current) use of aspirin: Secondary | ICD-10-CM | POA: Insufficient documentation

## 2020-09-05 DIAGNOSIS — W1839XA Other fall on same level, initial encounter: Secondary | ICD-10-CM | POA: Insufficient documentation

## 2020-09-05 DIAGNOSIS — Z79899 Other long term (current) drug therapy: Secondary | ICD-10-CM | POA: Diagnosis not present

## 2020-09-05 DIAGNOSIS — J449 Chronic obstructive pulmonary disease, unspecified: Secondary | ICD-10-CM | POA: Insufficient documentation

## 2020-09-05 DIAGNOSIS — W182XXA Fall in (into) shower or empty bathtub, initial encounter: Secondary | ICD-10-CM

## 2020-09-05 DIAGNOSIS — Z8541 Personal history of malignant neoplasm of cervix uteri: Secondary | ICD-10-CM | POA: Insufficient documentation

## 2020-09-05 DIAGNOSIS — J45909 Unspecified asthma, uncomplicated: Secondary | ICD-10-CM | POA: Diagnosis not present

## 2020-09-05 DIAGNOSIS — M25512 Pain in left shoulder: Secondary | ICD-10-CM | POA: Insufficient documentation

## 2020-09-05 DIAGNOSIS — Y93E1 Activity, personal bathing and showering: Secondary | ICD-10-CM | POA: Diagnosis not present

## 2020-09-05 MED ORDER — ONDANSETRON HCL 4 MG PO TABS: 4.0000 mg | ORAL_TABLET | Freq: Three times a day (TID) | ORAL | 0 refills | Status: DC | PRN

## 2020-09-05 MED ORDER — NAPROXEN 500 MG PO TABS: ORAL_TABLET | ORAL | 0 refills | Status: DC

## 2020-09-05 MED ORDER — METHOCARBAMOL 500 MG PO TABS: ORAL_TABLET | ORAL | 0 refills | Status: DC

## 2020-09-05 MED ORDER — ONDANSETRON 4 MG PO TBDP
4.0000 mg | ORAL_TABLET | Freq: Once | ORAL | Status: AC
  Administered 2020-09-05: 4 mg via ORAL
  Filled 2020-09-05: qty 1

## 2020-09-05 MED ORDER — KETOROLAC TROMETHAMINE 30 MG/ML IJ SOLN
30.0000 mg | Freq: Once | INTRAMUSCULAR | Status: AC
  Administered 2020-09-05: 30 mg via INTRAMUSCULAR
  Filled 2020-09-05: qty 1

## 2020-09-05 NOTE — Discharge Instructions (Addendum)
Use ice and heat for comfort.  Take the medication as prescribed.  You may have a rotator cuff injury to your left shoulder.  Please call the orthopedic office to get a appointment to be evaluated if you continue to have pain in your left shoulder.

## 2020-09-05 NOTE — ED Provider Notes (Signed)
[facility] EMERGENCY DEPARTMENT Provider Note   CSN: [id number] Arrival date & time: 09/05/20  0113   Time seen 1:50 AM  History Chief Complaint  Patient presents with  . Shoulder Injury    Mary [name] is a 40 y.o. female.  HPI   Patient states she was taking a shower in the afternoon on January 15 and she got weak and lost her balance and fell onto her left side.  She now has pain in the central portion of her upper chest that radiates around into her left shoulder area and behind her left shoulder area.  She also is concerned because she had surgery on her left elbow in March 2021.  She states since then she has had a cold and numb feeling in the area of the elbow.  She has relayed this to the surgeon.  Patient is right-handed.  She denies hitting her head.    PCP [facility] Dr Amedeo Plenty  Past Medical History:  Diagnosis Date  . Anxiety   . Asthma   . Bipolar 1 disorder ([place])   . Bulging lumbar disc   . BV (bacterial vaginosis) 02/18/2018  . Cancer of cervix ([place])   . Carpal tunnel syndrome on right   . Cervical cancer ([place])   . Chronic back pain   . COPD (chronic obstructive pulmonary disease) ([place])   . Depression   . Hypertension   . Migraines   . Physiological ovarian cysts   . PTSD (post-traumatic stress disorder)   . Rheumatoid arthritis ([place])   . S/P partial hysterectomy   . Vaginal Pap smear, abnormal     Patient Active Problem List   Diagnosis Date Noted  . Morbid obesity ([place]) 06/11/2020  . Weight gain 06/11/2020  . RLQ abdominal pain 06/11/2020  . Encounter for screening fecal occult blood testing 06/11/2020  . Well woman exam with routine gynecological exam 06/11/2020  . Hypertension 06/11/2020  . Pain of right side of body 12/02/2019  . Chronic migraine 12/02/2019  . BV (bacterial vaginosis) 02/18/2018  . Screening examination for STD (sexually transmitted disease) 02/06/2018  . Family planning 02/06/2018  .  History of cervical cancer 02/06/2018  . Vaginal Pap smear following hysterectomy for malignancy 02/06/2018  . Encounter for well woman exam with routine gynecological exam 02/06/2018  . Depression 02/06/2018  . Tenderness of female pelvic organs 02/06/2018  . Cancer of cervix ([place])   . Right ovarian cyst 03/19/2014  . Trichimoniasis 03/19/2014  . DYSPNEA 11/26/2009  . ASTHMA 11/23/2009    Past Surgical History:  Procedure Laterality Date  . ABDOMINAL HYSTERECTOMY    . ABDOMINAL HYSTERECTOMY    . 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 ORIF ELBOW FRACTURE Left 11/18/2019   Procedure: OPEN REDUCTION WITH REPAIR VERSUS FRAGMENT EXCISION CAPITELLUM FRACTURE LEFT ELBOW AND LIGAMENTOUS RECONSTRUCTION AS NECESSARY;  Surgeon: Roseanne Kaufman, MD;  Location: [place];  Service: Orthopedics;  Laterality: Left;  2 HRS     OB History    Gravida  3   Para  3   Term  3   Preterm      AB      Living  3     SAB      IAB      Ectopic      Multiple      Live Births              Family History  Problem Relation Age of Onset  . Stroke Mother        TIA  . Asthma Mother   . Cancer Mother   . Other Mother        blood platelets are dropping  . Heart attack Father   . Hypertension Father   . Cancer Father        skin cancer  . Stroke Father   . Asthma Daughter   . Sleep apnea Son   . Diabetes Maternal Grandmother   . Heart Problems Maternal Grandmother   . Anuerysm Maternal Grandfather   . Seizures Maternal Grandfather   . Cancer Paternal Grandmother        breast  . Fibromyalgia Sister   . Cancer Sister        cervical  . Cancer Sister        cervical  . Cancer Brother        thyroid cancer  . Cancer Maternal Aunt        breast cancer  . Cancer Paternal Aunt     Social History   Tobacco Use  . Smoking status: Never Smoker  . Smokeless tobacco: Never Used  Vaping Use  . Vaping Use: Never used  Substance Use Topics  . Alcohol use: No  . Drug use:  No    Home Medications Prior to Admission medications   Medication Sig Start Date End Date Taking? Authorizing Provider  methocarbamol (ROBAXIN) 500 MG tablet Take 1 or 2 po Q 6hrs for muscle pain 09/05/20  Yes Rolland Porter, MD  naproxen (NAPROSYN) 500 MG tablet Take 1 po BID with food prn pain 09/05/20  Yes Rolland Porter, MD  ondansetron (ZOFRAN) 4 MG tablet Take 1 tablet (4 mg total) by mouth every 8 (eight) hours as needed. 09/05/20  Yes Rolland Porter, MD  albuterol (VENTOLIN HFA) 108 (90 Base) MCG/ACT inhaler Inhale 2 puffs into the lungs every 6 (six) hours as needed for wheezing or shortness of breath. 10/16/19   Soyla Dryer, PA-C  aspirin 81 MG chewable tablet Chew 81 mg by mouth in the morning.     [provider]  cholecalciferol (VITAMIN D3) 25 MCG (1000 UNIT) tablet Take 1,000 Units by mouth daily.    [provider]  furosemide (LASIX) 40 MG tablet Take 40 mg by mouth daily.    [provider]  losartan (COZAAR) 50 MG tablet Take 1 tablet (50 mg total) by mouth daily. 06/11/20   Estill Dooms, NP  metroNIDAZOLE (FLAGYL) 500 MG tablet Take 1 tablet (500 mg total) by mouth 2 (two) times daily. 07/29/20   Cresenzo-Dishmon, Joaquim Lai, CNM  mometasone-formoterol (DULERA) 100-5 MCG/ACT AERO Inhale 2 puffs into the lungs 2 (two) times daily. 10/16/19   Soyla Dryer, PA-C  rizatriptan (MAXALT-MLT) 10 MG disintegrating tablet Take 1 tablet (10 mg total) by mouth as needed. May repeat in 2 hours if needed Patient not taking: Reported on 06/11/2020 12/02/19   Marcial Pacas, MD  vitamin B-12 (CYANOCOBALAMIN) 50 MCG tablet Take 50 mcg by mouth daily.    [provider]    Allergies    Buprenorphine hcl, Morphine and related, Penicillins, Gabapentin, Ibuprofen & acetaminophen, Latex, and Tylenol [acetaminophen]  Review of Systems   Review of Systems  All other systems reviewed and are negative.   Physical Exam Updated Vital Signs BP (!) 150/89   Pulse 66    Temp 98.2 F (36.8 C) (Oral)   Resp 18   Ht 5'  9" (1.753 m)   Wt 94.3 kg   SpO2 99%   BMI 30.72 kg/m   Physical Exam Vitals and nursing note reviewed.  Constitutional:      General: She is not in acute distress.    Appearance: Normal appearance. She is obese. She is not ill-appearing or toxic-appearing.  HENT:     Head: Normocephalic and atraumatic.     Right Ear: External ear normal.     Left Ear: External ear normal.  Eyes:     Comments: Patient has artificial right eye  Cardiovascular:     Rate and Rhythm: Normal rate.  Pulmonary:     Effort: Pulmonary effort is normal. No respiratory distress.  Chest:     Chest wall: Tenderness present.  Musculoskeletal:        General: Tenderness present.       Arms:     Cervical back: Normal range of motion.     Comments: Patient has a well-healed large scar on the outer aspect of her left elbow.  She has normal range of motion in her left elbow.  She has good distal pulses and capillary refill.  She is not tender over the left clavicle.  She also is tender in the upper sternum without crepitance.  She has some tenderness in the left upper chest area below the clavicle.  When I abduct her left shoulder that is painful and she can get the shoulder to almost 90 degrees.  That causes a lot of pain in the left trapezius and behind her shoulder in the scapular area.  Skin:    General: Skin is warm and dry.  Neurological:     General: No focal deficit present.     Mental Status: She is alert and oriented to person, place, and time.     Cranial Nerves: No cranial nerve deficit.  Psychiatric:        Mood and Affect: Affect is flat.        Behavior: Behavior normal.        Thought Content: Thought content normal.     ED Results / Procedures / Treatments   Labs (all labs ordered are listed, but only abnormal results are displayed) Labs Reviewed - No data to display  EKG EKG Interpretation  Date/Time:  Sunday September 05 2020 01:30:57  EST Ventricular Rate:  73 PR Interval:    QRS Duration: 100 QT Interval:  417 QTC Calculation: 460 R Axis:   15 Text Interpretation: Sinus rhythm Low voltage, precordial leads Borderline T abnormalities, anterior leads No significant change since last tracing 18 Nov 2019 Confirmed by Rolland Porter [phone] on 09/05/2020 1:37:12 AM   Radiology DG Ribs Unilateral W/Chest Left  Result Date: 09/05/2020 CLINICAL DATA:  Fall EXAM: LEFT RIBS AND CHEST - 3+ VIEW COMPARISON:  None. FINDINGS: No fracture or other bone lesions are seen involving the ribs. There is no evidence of pneumothorax or pleural effusion. Both lungs are clear. Heart size and mediastinal contours are within normal limits. IMPRESSION: Negative. Electronically Signed   By: Ulyses Jarred M.D.   On: 09/05/2020 03:17   DG Sternum  Result Date: 09/05/2020 CLINICAL DATA:  Fall EXAM: STERNUM - 2+ VIEW COMPARISON:  None. FINDINGS: There is no evidence of fracture or other focal bone lesions. Lungs are clear. IMPRESSION: Negative. Electronically Signed   By: Ulyses Jarred M.D.   On: 09/05/2020 02:59   DG Shoulder Left  Result Date: 09/05/2020 CLINICAL DATA:  Fall EXAM: LEFT SHOULDER -  2+ VIEW COMPARISON:  None. FINDINGS: There is no evidence of fracture or dislocation. There is no evidence of arthropathy or other focal bone abnormality. Soft tissues are unremarkable. IMPRESSION: Negative. Electronically Signed   By: Ulyses Jarred M.D.   On: 09/05/2020 03:18    Procedures Procedures (including critical care time)  Medications Ordered in ED Medications  ketorolac (TORADOL) 30 MG/ML injection 30 mg (30 mg Intramuscular Given 09/05/20 0206)  ondansetron (ZOFRAN-ODT) disintegrating tablet 4 mg (4 mg Oral Given 09/05/20 0206)    ED Course  I have reviewed the triage vital signs and the nursing notes.  Pertinent labs & imaging results that were available during my care of the patient were reviewed by me and considered in my medical decision  making (see chart for details).    MDM Rules/Calculators/A&P                          Patient was given Toradol IM for her complaints of pain.  X-rays were done of the areas that were painful.  Her elbow has good range of motion and is without pain so that was not x-rayed.  3:20 AM patient's x-rays are all read as normal by the radiologist.  When I went to see the patient she states that Toradol has helped her pain.  We discussed her x-ray results which were without acute bony injury.  She may have a rotator cuff injury because of the difficulty abducting her left upper extremity.  She is agreeable to seeing a local orthopedist for this injury.  Final Clinical Impression(s) / ED Diagnoses Final diagnoses:  Fall in shower  Acute pain of left shoulder  Left-sided chest pain    Rx / DC Orders ED Discharge Orders         Ordered    naproxen (NAPROSYN) 500 MG tablet        09/05/20 0336    methocarbamol (ROBAXIN) 500 MG tablet        09/05/20 0336    ondansetron (ZOFRAN) 4 MG tablet  Every 8 hours PRN        09/05/20 0336         Plan discharge  Rolland Porter, MD, Barbette Or, MD 09/05/20 [phone]

## 2020-09-05 NOTE — ED Triage Notes (Signed)
Pt c/o left shoulder and arm pain after falling yesterday.

## 2020-09-21 ENCOUNTER — Ambulatory Visit: Payer: Medicaid Other | Admitting: Neurology

## 2020-09-21 ENCOUNTER — Encounter: Payer: Self-pay | Admitting: Neurology
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
## 2020-11-24 DIAGNOSIS — J45909 Unspecified asthma, uncomplicated: Secondary | ICD-10-CM | POA: Diagnosis not present

## 2020-11-24 DIAGNOSIS — M545 Low back pain, unspecified: Secondary | ICD-10-CM | POA: Diagnosis not present

## 2020-11-24 DIAGNOSIS — F418 Other specified anxiety disorders: Secondary | ICD-10-CM | POA: Diagnosis not present

## 2020-11-24 DIAGNOSIS — I10 Essential (primary) hypertension: Secondary | ICD-10-CM | POA: Diagnosis not present

## 2020-11-25 DIAGNOSIS — F418 Other specified anxiety disorders: Secondary | ICD-10-CM | POA: Diagnosis not present

## 2020-11-25 DIAGNOSIS — J45909 Unspecified asthma, uncomplicated: Secondary | ICD-10-CM | POA: Diagnosis not present

## 2020-11-25 DIAGNOSIS — M545 Low back pain, unspecified: Secondary | ICD-10-CM | POA: Diagnosis not present

## 2020-11-25 DIAGNOSIS — I10 Essential (primary) hypertension: Secondary | ICD-10-CM | POA: Diagnosis not present

## 2021-01-14 ENCOUNTER — Other Ambulatory Visit: Payer: Self-pay

## 2021-01-14 ENCOUNTER — Emergency Department (HOSPITAL_COMMUNITY): Payer: Medicaid Other

## 2021-01-14 ENCOUNTER — Encounter (HOSPITAL_COMMUNITY): Payer: Self-pay

## 2021-01-14 ENCOUNTER — Emergency Department (HOSPITAL_COMMUNITY)
Admission: EM | Admit: 2021-01-14 | Discharge: 2021-01-15 | Disposition: A | Discharge status: Home / Self Care | Payer: Medicaid Other | Attending: Emergency Medicine | Admitting: Emergency Medicine

## 2021-01-14 DIAGNOSIS — R519 Headache, unspecified: Secondary | ICD-10-CM | POA: Diagnosis not present

## 2021-01-14 DIAGNOSIS — J45909 Unspecified asthma, uncomplicated: Secondary | ICD-10-CM | POA: Diagnosis not present

## 2021-01-14 DIAGNOSIS — W57XXXA Bitten or stung by nonvenomous insect and other nonvenomous arthropods, initial encounter: Secondary | ICD-10-CM | POA: Diagnosis not present

## 2021-01-14 DIAGNOSIS — R059 Cough, unspecified: Secondary | ICD-10-CM | POA: Insufficient documentation

## 2021-01-14 DIAGNOSIS — I10 Essential (primary) hypertension: Secondary | ICD-10-CM | POA: Insufficient documentation

## 2021-01-14 DIAGNOSIS — Z20822 Contact with and (suspected) exposure to covid-19: Secondary | ICD-10-CM | POA: Insufficient documentation

## 2021-01-14 DIAGNOSIS — R079 Chest pain, unspecified: Secondary | ICD-10-CM | POA: Diagnosis not present

## 2021-01-14 DIAGNOSIS — R0789 Other chest pain: Secondary | ICD-10-CM | POA: Insufficient documentation

## 2021-01-14 DIAGNOSIS — Z91040 Latex allergy status: Secondary | ICD-10-CM | POA: Diagnosis not present

## 2021-01-14 DIAGNOSIS — Z8541 Personal history of malignant neoplasm of cervix uteri: Secondary | ICD-10-CM | POA: Insufficient documentation

## 2021-01-14 DIAGNOSIS — Z7982 Long term (current) use of aspirin: Secondary | ICD-10-CM | POA: Diagnosis not present

## 2021-01-14 DIAGNOSIS — Z7951 Long term (current) use of inhaled steroids: Secondary | ICD-10-CM | POA: Insufficient documentation

## 2021-01-14 DIAGNOSIS — R067 Sneezing: Secondary | ICD-10-CM | POA: Diagnosis not present

## 2021-01-14 DIAGNOSIS — J449 Chronic obstructive pulmonary disease, unspecified: Secondary | ICD-10-CM | POA: Diagnosis not present

## 2021-01-14 DIAGNOSIS — R0981 Nasal congestion: Secondary | ICD-10-CM | POA: Insufficient documentation

## 2021-01-14 DIAGNOSIS — Z79899 Other long term (current) drug therapy: Secondary | ICD-10-CM | POA: Diagnosis not present

## 2021-01-14 LAB — CBC WITH DIFFERENTIAL/PLATELET
Abs Immature Granulocytes: 0.03 10*3/uL (ref 0.00–0.07)
Basophils Absolute: 0.1 10*3/uL (ref 0.0–0.1)
Basophils Relative: 1 %
Eosinophils Absolute: 0.2 10*3/uL (ref 0.0–0.5)
Eosinophils Relative: 2 %
HCT: 38.9 % (ref 36.0–46.0)
Hemoglobin: 12.7 g/dL (ref 12.0–15.0)
Immature Granulocytes: 0 %
Lymphocytes Relative: 24 %
Lymphs Abs: 1.8 10*3/uL (ref 0.7–4.0)
MCH: 29.4 pg (ref 26.0–34.0)
MCHC: 32.6 g/dL (ref 30.0–36.0)
MCV: 90 fL (ref 80.0–100.0)
Monocytes Absolute: 0.5 10*3/uL (ref 0.1–1.0)
Monocytes Relative: 7 %
Neutro Abs: 5 10*3/uL (ref 1.7–7.7)
Neutrophils Relative %: 66 %
Platelets: 192 10*3/uL (ref 150–400)
RBC: 4.32 MIL/uL (ref 3.87–5.11)
RDW: 13.3 % (ref 11.5–15.5)
WBC: 7.5 10*3/uL (ref 4.0–10.5)
nRBC: 0 % (ref 0.0–0.2)

## 2021-01-14 NOTE — ED Provider Notes (Signed)
[facility] EMERGENCY DEPARTMENT Provider Note   CSN: [id number] Arrival date & time: 01/14/21  2240     History Chief Complaint  Patient presents with  . Nasal Congestion    Mary [name] is a 40 y.o. female.  Patient with history of asthma, bipolar disease, hypertension, PTSD presenting with nasal congestion, coughing, sneezing, headache.  Symptoms onset 2 days ago after she was bitten by a tick.  States her significant other removed a tick from her right upper back.  After that she developed congestion with coughing and runny nose.  Gradual onset diffuse headache.  Associate with some right-sided chest pain that last for several minutes to hours at a time.  Pain does not radiate to her arm, neck or back.  No shortness of breath.  No abdominal pain, nausea or vomiting.  No rash.  No pain with urination or blood in the urine.  She is taking Benadryl at home without relief.  States she has allergies constantly but is worse since she was bitten by the tick.  No fever.  No sick contacts or recent travel. No birth control use.   The history is provided by the patient.       Past Medical History:  Diagnosis Date  . Anxiety   . Asthma   . Bipolar 1 disorder ([place])   . Bulging lumbar disc   . BV (bacterial vaginosis) 02/18/2018  . Cancer of cervix ([place])   . Carpal tunnel syndrome on right   . Cervical cancer ([place])   . Chronic back pain   . COPD (chronic obstructive pulmonary disease) ([place])   . Depression   . Hypertension   . Migraines   . Physiological ovarian cysts   . PTSD (post-traumatic stress disorder)   . Rheumatoid arthritis ([place])   . S/P partial hysterectomy   . Vaginal Pap smear, abnormal     Patient Active Problem List   Diagnosis Date Noted  . Morbid obesity ([place]) 06/11/2020  . Weight gain 06/11/2020  . RLQ abdominal pain 06/11/2020  . Encounter for screening fecal occult blood testing 06/11/2020  . Well woman exam with routine gynecological exam 06/11/2020   . Hypertension 06/11/2020  . Pain of right side of body 12/02/2019  . Chronic migraine 12/02/2019  . BV (bacterial vaginosis) 02/18/2018  . Screening examination for STD (sexually transmitted disease) 02/06/2018  . Family planning 02/06/2018  . History of cervical cancer 02/06/2018  . Vaginal Pap smear following hysterectomy for malignancy 02/06/2018  . Encounter for well woman exam with routine gynecological exam 02/06/2018  . Depression 02/06/2018  . Tenderness of female pelvic organs 02/06/2018  . Cancer of cervix ([place])   . Right ovarian cyst 03/19/2014  . Trichimoniasis 03/19/2014  . DYSPNEA 11/26/2009  . ASTHMA 11/23/2009    Past Surgical History:  Procedure Laterality Date  . ABDOMINAL HYSTERECTOMY    . ABDOMINAL HYSTERECTOMY    . EYE SURGERY  age 29   Artificial right eye  . ORIF ELBOW FRACTURE Left 11/18/2019   Procedure: OPEN REDUCTION WITH REPAIR VERSUS FRAGMENT EXCISION CAPITELLUM FRACTURE LEFT ELBOW AND LIGAMENTOUS RECONSTRUCTION AS NECESSARY;  Surgeon: Roseanne Kaufman, MD;  Location: [facility];  Service: Orthopedics;  Laterality: Left;  2 HRS     OB History    Gravida  3   Para  3   Term  3   Preterm      AB      Living  3     SAB  IAB      Ectopic      Multiple      Live Births              Family History  Problem Relation Age of Onset  . Stroke Mother        TIA  . Asthma Mother   . Cancer Mother   . Other Mother        blood platelets are dropping  . Heart attack Father   . Hypertension Father   . Cancer Father        skin cancer  . Stroke Father   . Asthma Daughter   . Sleep apnea Son   . Diabetes Maternal Grandmother   . Heart Problems Maternal Grandmother   . Anuerysm Maternal Grandfather   . Seizures Maternal Grandfather   . Cancer Paternal Grandmother        breast  . Fibromyalgia Sister   . Cancer Sister        cervical  . Cancer Sister        cervical  . Cancer Brother        thyroid cancer  . Cancer Maternal  Aunt        breast cancer  . Cancer Paternal Aunt     Social History   Tobacco Use  . Smoking status: Never Smoker  . Smokeless tobacco: Never Used  Vaping Use  . Vaping Use: Never used  Substance Use Topics  . Alcohol use: No  . Drug use: No    Home Medications Prior to Admission medications   Medication Sig Start Date End Date Taking? Authorizing Provider  albuterol (VENTOLIN HFA) 108 (90 Base) MCG/ACT inhaler Inhale 2 puffs into the lungs every 6 (six) hours as needed for wheezing or shortness of breath. 10/16/19   Soyla Dryer, PA-C  aspirin 81 MG chewable tablet Chew 81 mg by mouth in the morning.     [provider]  cholecalciferol (VITAMIN D3) 25 MCG (1000 UNIT) tablet Take 1,000 Units by mouth daily.    [provider]  furosemide (LASIX) 40 MG tablet Take 40 mg by mouth daily.    [provider]  losartan (COZAAR) 50 MG tablet Take 1 tablet (50 mg total) by mouth daily. 06/11/20   Estill Dooms, NP  methocarbamol (ROBAXIN) 500 MG tablet Take 1 or 2 po Q 6hrs for muscle pain 09/05/20   Rolland Porter, MD  metroNIDAZOLE (FLAGYL) 500 MG tablet Take 1 tablet (500 mg total) by mouth 2 (two) times daily. 07/29/20   Cresenzo-Dishmon, Joaquim Lai, CNM  mometasone-formoterol (DULERA) 100-5 MCG/ACT AERO Inhale 2 puffs into the lungs 2 (two) times daily. 10/16/19   Soyla Dryer, PA-C  naproxen (NAPROSYN) 500 MG tablet Take 1 po BID with food prn pain 09/05/20   Rolland Porter, MD  ondansetron (ZOFRAN) 4 MG tablet Take 1 tablet (4 mg total) by mouth every 8 (eight) hours as needed. 09/05/20   Rolland Porter, MD  rizatriptan (MAXALT-MLT) 10 MG disintegrating tablet Take 1 tablet (10 mg total) by mouth as needed. May repeat in 2 hours if needed Patient not taking: Reported on 06/11/2020 12/02/19   Marcial Pacas, MD  vitamin B-12 (CYANOCOBALAMIN) 50 MCG tablet Take 50 mcg by mouth daily.    [provider]    Allergies    Buprenorphine hcl, Morphine and related,  Penicillins, Gabapentin, Ibuprofen & acetaminophen, Latex, and Tylenol [acetaminophen]  Review of Systems   Review of Systems  Constitutional:  Positive for fatigue. Negative for fever.  HENT: Positive for congestion and sore throat.   Respiratory: Positive for cough and chest tightness.   Cardiovascular: Positive for chest pain.  Gastrointestinal: Negative for abdominal pain and vomiting.  Genitourinary: Negative for dysuria and hematuria.  Musculoskeletal: Positive for arthralgias and myalgias.  Skin: Negative for rash.  Neurological: Positive for headaches. Negative for dizziness and weakness.   all other systems are negative except as noted in the HPI and PMH.    Physical Exam Updated Vital Signs BP (!) 152/100 (BP Location: Right Arm)   Pulse 70   Temp 98 F (36.7 C) (Oral)   Resp 18   Ht 5\' 9"  (1.753 m)   Wt 93 kg   SpO2 99%   BMI 30.27 kg/m   Physical Exam Vitals and nursing note reviewed.  Constitutional:      General: She is not in acute distress.    Appearance: She is well-developed.  HENT:     Head: Normocephalic and atraumatic.     Nose: Congestion and rhinorrhea present.     Mouth/Throat:     Pharynx: No oropharyngeal exudate.  Eyes:     Conjunctiva/sclera: Conjunctivae normal.     Pupils: Pupils are equal, round, and reactive to light.     Comments: R eye prosthesis  Neck:     Comments: No meningismus. Cardiovascular:     Rate and Rhythm: Normal rate and regular rhythm.     Heart sounds: Normal heart sounds. No murmur heard.   Pulmonary:     Effort: Pulmonary effort is normal. No respiratory distress.     Breath sounds: Normal breath sounds.     Comments: Reproducible R chest wall tenderness Chest:     Chest wall: Tenderness present.  Abdominal:     Palpations: Abdomen is soft.     Tenderness: There is no abdominal tenderness. There is no guarding or rebound.  Musculoskeletal:        General: No tenderness. Normal range of motion.     Cervical  back: Normal range of motion and neck supple.  Skin:    General: Skin is warm.  Neurological:     General: No focal deficit present.     Mental Status: She is alert and oriented to person, place, and time. Mental status is at baseline.     Cranial Nerves: No cranial nerve deficit.     Motor: No abnormal muscle tone.     Coordination: Coordination normal.     Comments:  5/5 strength throughout. CN 2-12 intact.Equal grip strength.   Psychiatric:        Behavior: Behavior normal.     ED Results / Procedures / Treatments   Labs (all labs ordered are listed, but only abnormal results are displayed) Labs Reviewed  BASIC METABOLIC PANEL - Abnormal; Notable for the following components:      Result Value   Potassium 3.4 (*)    Glucose, Bld 111 (*)    Creatinine, Ser 1.14 (*)    All other components within normal limits  RESP PANEL BY RT-PCR (FLU A&B, COVID) ARPGX2  CBC WITH DIFFERENTIAL/PLATELET  D-DIMER, QUANTITATIVE  ROCKY MTN SPOTTED FVR ABS PNL(IGG+IGM)  B. BURGDORFI ANTIBODIES  TROPONIN I (HIGH SENSITIVITY)  TROPONIN I (HIGH SENSITIVITY)    EKG EKG Interpretation  Date/Time:  Saturday Jan 15 2021 00:22:08 EDT Ventricular Rate:  60 PR Interval:  180 QRS Duration: 104 QT Interval:  439 QTC Calculation: 439 R Axis:   9  Text Interpretation: Sinus rhythm Low voltage, precordial leads Borderline T abnormalities, diffuse leads No significant change was found Confirmed by Ezequiel Essex [phone]) on 01/15/2021 12:41:41 AM   Radiology DG Chest 2 View  Result Date: 01/14/2021 CLINICAL DATA:  Sneezing, coughing, headache, nasal congestion EXAM: CHEST - 2 VIEW COMPARISON:  09/05/2020 FINDINGS: The heart size and mediastinal contours are within normal limits. Both lungs are clear. The visualized skeletal structures are unremarkable. IMPRESSION: No active cardiopulmonary disease. Electronically Signed   By: Randa Ngo M.D.   On: 01/14/2021 23:32    Procedures Procedures    Medications Ordered in ED Medications - No data to display  ED Course  I have reviewed the triage vital signs and the nursing notes.  Pertinent labs & imaging results that were available during my care of the patient were reviewed by me and considered in my medical decision making (see chart for details).    MDM Rules/Calculators/A&P                         Headache, congestion, cough, intermittent right-sided chest pain since tick bite 2 days ago. Headache is gradual in onset.  Low suspicion for subarachnoid hemorrhage, meningitis, temporal arteritis. Normal neuro exam.  Chest pain is atypical for ACS or PE.  Low suspicion for pulmonary embolism or aortic dissection. Labs reassuring.  EKG is sinus rhythm with nonspecific ST changes similar to previous.  Troponin and D-dimer are negative.  Negative chest x-ray. Coronavirus test is negative.  Will treat empirically with doxycycline for suspected tick bite exposure. Patient may be given nasal steroids for her congestion.  Discussed p.o. hydration at home, antipyretics, PCP follow-up.  Return precautions discussed.  Mary [name] was evaluated in Emergency Department on 01/15/2021 for the symptoms described in the history of present illness. She was evaluated in the context of the global COVID-19 pandemic, which necessitated consideration that the patient might be at risk for infection with the SARS-CoV-2 virus that causes COVID-19. Institutional protocols and algorithms that pertain to the evaluation of patients at risk for COVID-19 are in a state of rapid change based on information released by regulatory bodies including the CDC and federal and state organizations. These policies and algorithms were followed during the patient's care in the ED.  Final Clinical Impression(s) / ED Diagnoses Final diagnoses:  Nasal congestion  Tick bite, unspecified site, initial encounter    Rx / DC Orders ED Discharge Orders    None        Kodie Kishi, Annie Main, MD 01/15/21 [phone]

## 2021-01-14 NOTE — ED Triage Notes (Signed)
Pt arrives via POV c/o sneezing, coughing, headache and nasal congestion. Pt endorses annual allergies but also endorses being bit by a tick 2 days ago and symptoms flared up following this occurrence.

## 2021-01-15 LAB — BASIC METABOLIC PANEL
Anion gap: 7 (ref 5–15)
BUN: 12 mg/dL (ref 6–20)
CO2: 27 mmol/L (ref 22–32)
Calcium: 9.1 mg/dL (ref 8.9–10.3)
Chloride: 104 mmol/L (ref 98–111)
Creatinine, Ser: 1.14 mg/dL — ABNORMAL HIGH (ref 0.44–1.00)
GFR, Estimated: >60 mL/min (ref >60)
Glucose, Bld: 111 mg/dL — ABNORMAL HIGH (ref 70–99)
Potassium: 3.4 mmol/L — ABNORMAL LOW (ref 3.5–5.1)
Sodium: 138 mmol/L (ref 135–145)

## 2021-01-15 LAB — RESP PANEL BY RT-PCR (FLU A&B, COVID) ARPGX2
Influenza A by PCR: NEGATIVE
Influenza B by PCR: NEGATIVE
SARS Coronavirus 2 by RT PCR: NEGATIVE

## 2021-01-15 LAB — TROPONIN I (HIGH SENSITIVITY)
Troponin I (High Sensitivity): <2 ng/L
Troponin I (High Sensitivity): <2 ng/L

## 2021-01-15 LAB — D-DIMER, QUANTITATIVE: D-Dimer, Quant: <0.27 ug{FEU}/mL (ref 0.00–0.50)

## 2021-01-15 MED ORDER — DOXYCYCLINE HYCLATE 100 MG PO CAPS: 100.0000 mg | ORAL_CAPSULE | Freq: Two times a day (BID) | ORAL | 0 refills | Status: DC

## 2021-01-15 MED ORDER — FLUTICASONE PROPIONATE 50 MCG/ACT NA SUSP: 1.0000 | Freq: Every day | NASAL | 0 refills | Status: DC

## 2021-01-15 NOTE — Discharge Instructions (Signed)
Take the antibiotics as prescribed.  Use nasal steroids to help with your congestion.  There is no evidence of heart attack or blood clot in the lung.  Follow-up with your doctor.  Return to the ED if develop new or worsening symptoms.

## 2021-01-19 LAB — ROCKY MTN SPOTTED FVR ABS PNL(IGG+IGM)
RMSF IgG: NEGATIVE
RMSF IgM: 1.33 index — ABNORMAL HIGH (ref 0.00–0.89)

## 2021-03-21 DIAGNOSIS — I10 Essential (primary) hypertension: Secondary | ICD-10-CM | POA: Diagnosis not present

## 2021-03-21 DIAGNOSIS — Z8541 Personal history of malignant neoplasm of cervix uteri: Secondary | ICD-10-CM | POA: Diagnosis not present

## 2021-03-21 DIAGNOSIS — Z880 Allergy status to penicillin: Secondary | ICD-10-CM | POA: Diagnosis not present

## 2021-03-21 DIAGNOSIS — Z7951 Long term (current) use of inhaled steroids: Secondary | ICD-10-CM | POA: Diagnosis not present

## 2021-03-21 DIAGNOSIS — J3489 Other specified disorders of nose and nasal sinuses: Secondary | ICD-10-CM | POA: Diagnosis not present

## 2021-03-21 DIAGNOSIS — Z91040 Latex allergy status: Secondary | ICD-10-CM | POA: Diagnosis not present

## 2021-03-21 DIAGNOSIS — Z79899 Other long term (current) drug therapy: Secondary | ICD-10-CM | POA: Diagnosis not present

## 2021-03-21 DIAGNOSIS — Z87892 Personal history of anaphylaxis: Secondary | ICD-10-CM | POA: Diagnosis not present

## 2021-03-21 DIAGNOSIS — Z888 Allergy status to other drugs, medicaments and biological substances status: Secondary | ICD-10-CM | POA: Diagnosis not present

## 2021-03-21 DIAGNOSIS — F4310 Post-traumatic stress disorder, unspecified: Secondary | ICD-10-CM | POA: Diagnosis not present

## 2021-03-21 DIAGNOSIS — Z20822 Contact with and (suspected) exposure to covid-19: Secondary | ICD-10-CM | POA: Diagnosis not present

## 2021-03-21 DIAGNOSIS — Z885 Allergy status to narcotic agent status: Secondary | ICD-10-CM | POA: Diagnosis not present

## 2021-03-21 DIAGNOSIS — B348 Other viral infections of unspecified site: Secondary | ICD-10-CM | POA: Diagnosis not present

## 2021-03-28 IMAGING — DX DG ELBOW COMPLETE 3+V*L*
5 series · 5 of 5 positions shown · non-contrast
Comparison: None.

CLINICAL DATA: Trauma, fall, left elbow pain

EXAM:
LEFT ELBOW - COMPLETE 3+ VIEW

[elbow ap]
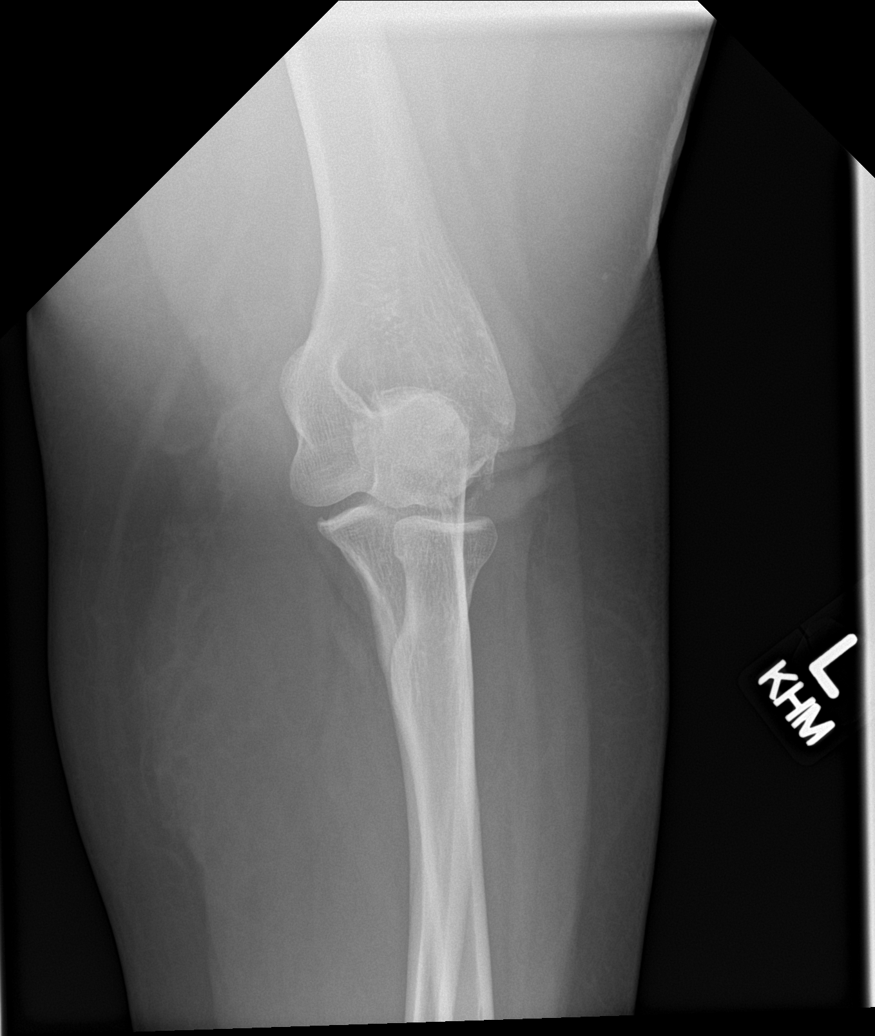

[elbow obl (1 of 3)]
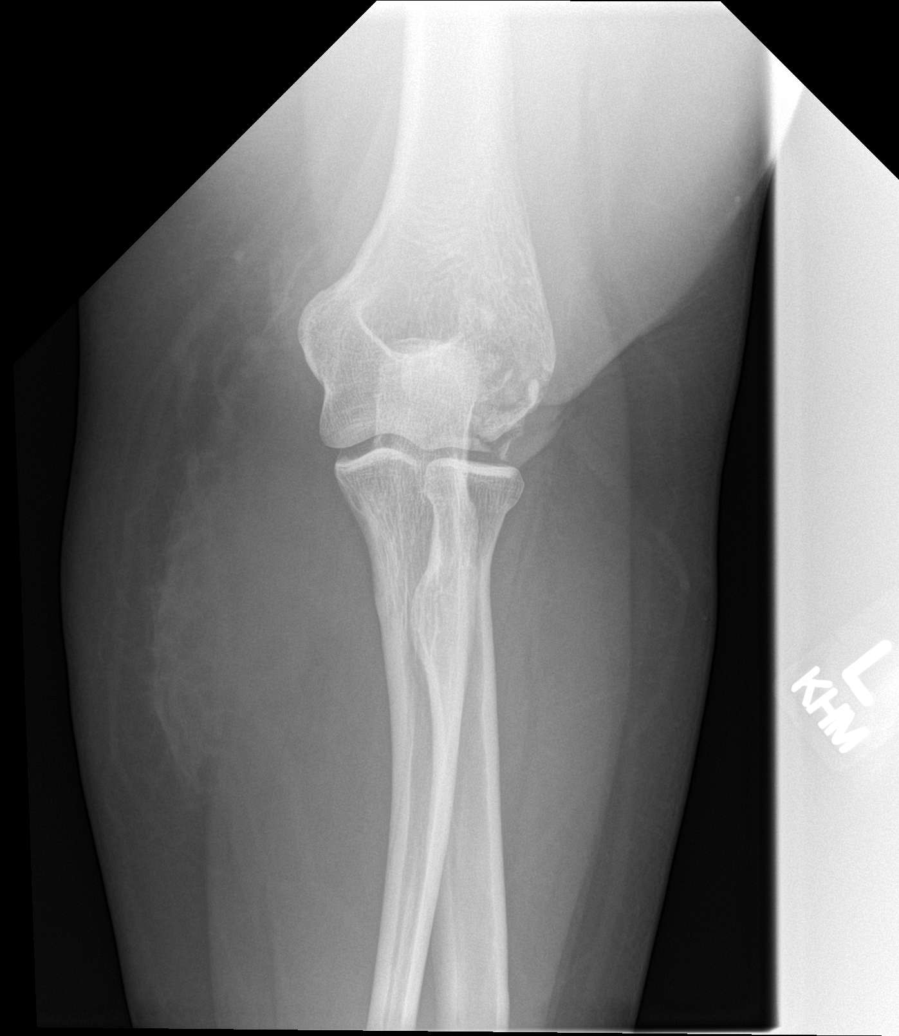

[elbow obl (2 of 3)]
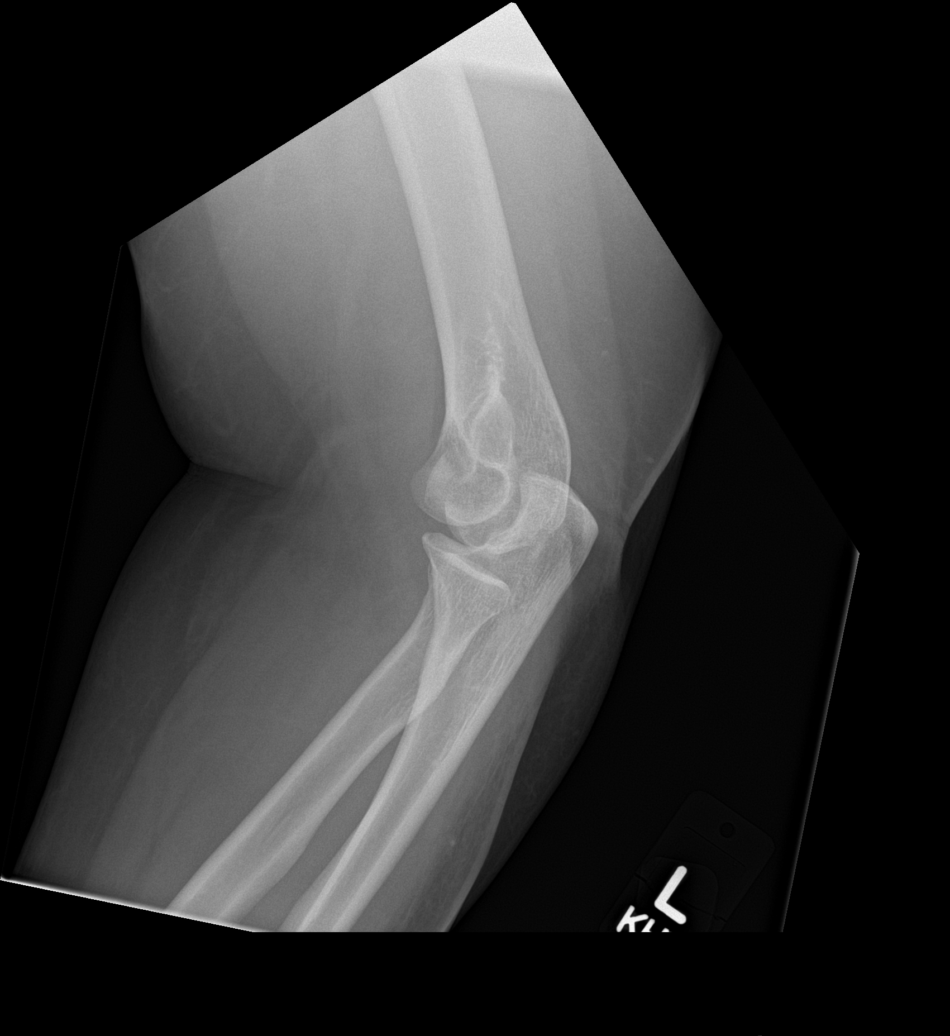

[elbow lat]
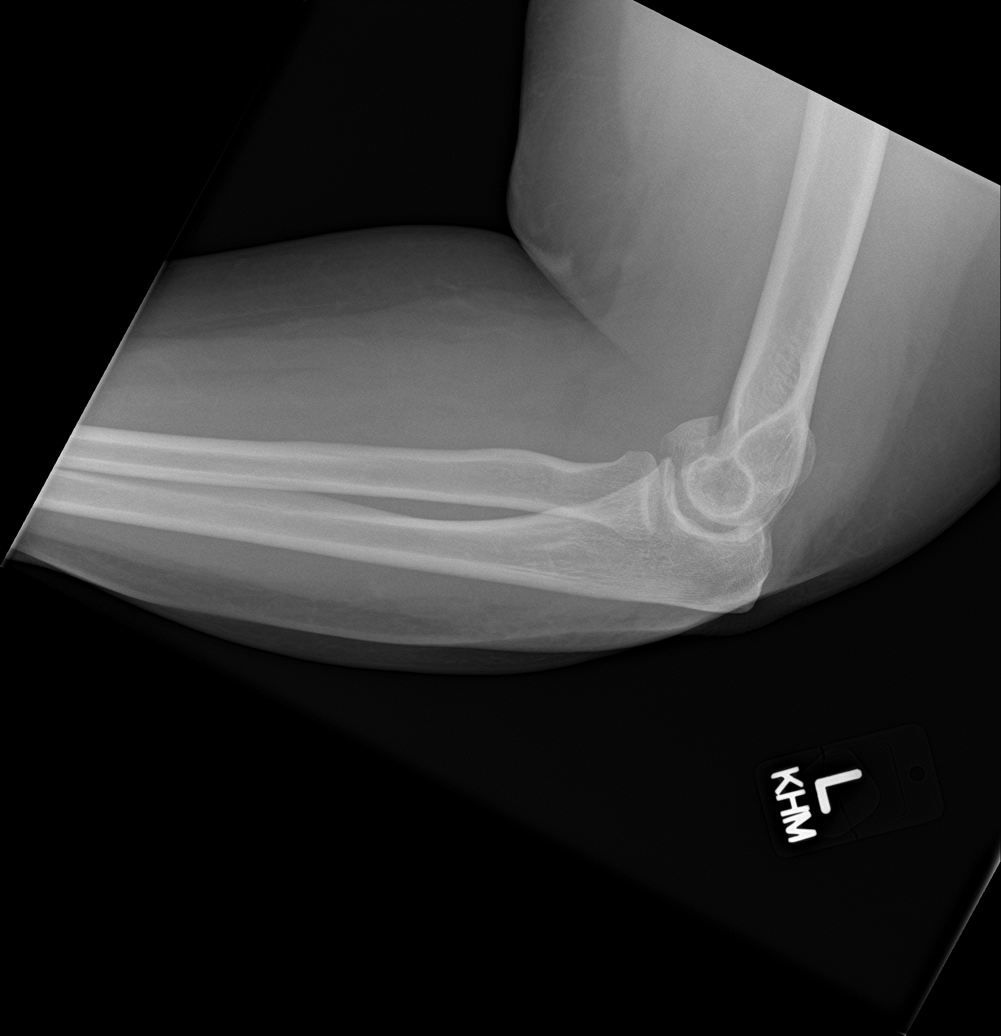

[elbow obl (3 of 3)]
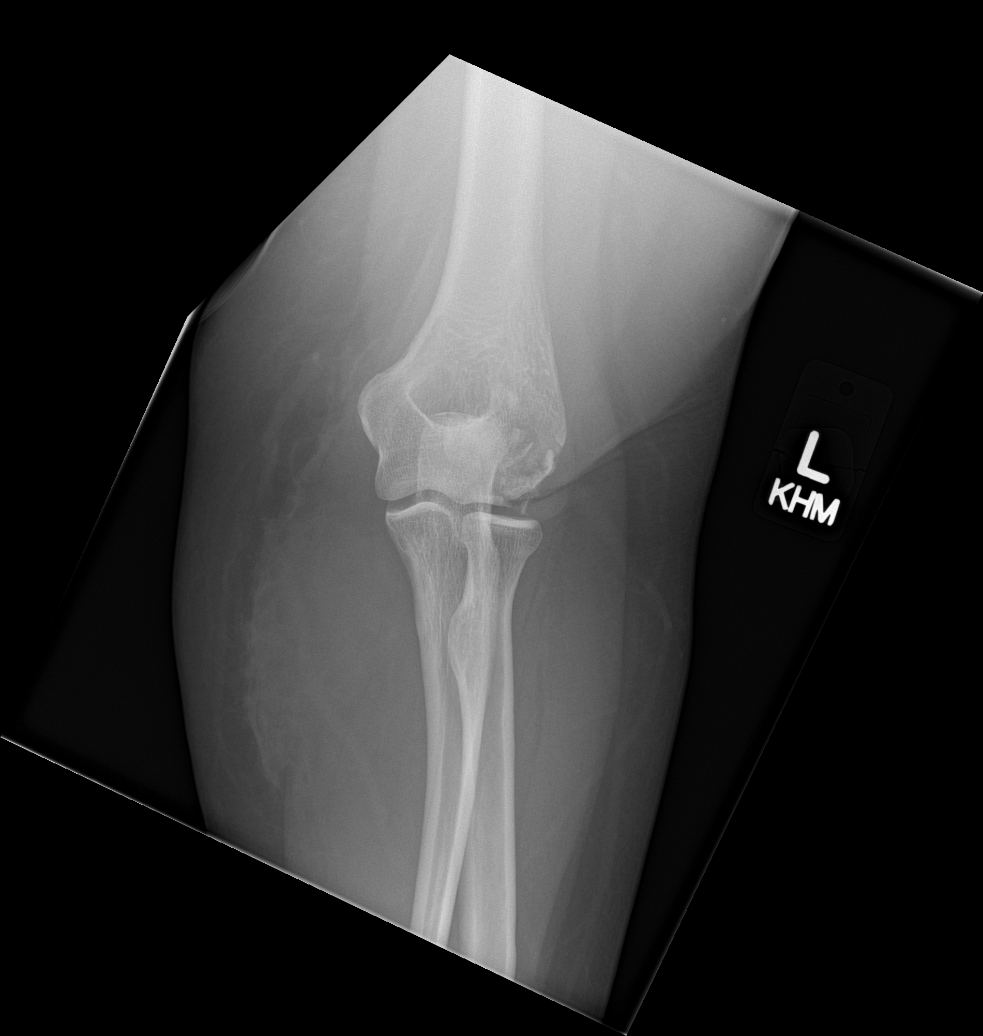

[5 of 5 positions shown; findings below may reference images not displayed]

FINDINGS: Comminuted intra-articular left capitellar fracture without
significant displacement. Left elbow joint effusion. No dislocation.
No focal osseous lesions. Posterior left elbow soft tissue swelling.
IMPRESSION: Comminuted intra-articular left capitellar fracture with left elbow
joint effusion.

## 2021-04-06 DIAGNOSIS — S59902A Unspecified injury of left elbow, initial encounter: Secondary | ICD-10-CM | POA: Diagnosis not present

## 2021-04-06 DIAGNOSIS — S53492A Other sprain of left elbow, initial encounter: Secondary | ICD-10-CM | POA: Diagnosis not present

## 2021-04-06 DIAGNOSIS — S4992XA Unspecified injury of left shoulder and upper arm, initial encounter: Secondary | ICD-10-CM | POA: Diagnosis not present

## 2021-04-06 DIAGNOSIS — G8911 Acute pain due to trauma: Secondary | ICD-10-CM | POA: Diagnosis not present

## 2021-04-06 DIAGNOSIS — S53402A Unspecified sprain of left elbow, initial encounter: Secondary | ICD-10-CM | POA: Diagnosis not present

## 2021-04-06 DIAGNOSIS — Z880 Allergy status to penicillin: Secondary | ICD-10-CM | POA: Diagnosis not present

## 2021-04-06 DIAGNOSIS — Z91040 Latex allergy status: Secondary | ICD-10-CM | POA: Diagnosis not present

## 2021-04-06 DIAGNOSIS — I10 Essential (primary) hypertension: Secondary | ICD-10-CM | POA: Diagnosis not present

## 2021-04-06 DIAGNOSIS — Z888 Allergy status to other drugs, medicaments and biological substances status: Secondary | ICD-10-CM | POA: Diagnosis not present

## 2021-04-06 DIAGNOSIS — Z79899 Other long term (current) drug therapy: Secondary | ICD-10-CM | POA: Diagnosis not present

## 2021-04-06 DIAGNOSIS — Z885 Allergy status to narcotic agent status: Secondary | ICD-10-CM | POA: Diagnosis not present

## 2021-04-15 DIAGNOSIS — U071 COVID-19: Secondary | ICD-10-CM | POA: Diagnosis not present

## 2021-04-21 DIAGNOSIS — M25522 Pain in left elbow: Secondary | ICD-10-CM | POA: Diagnosis not present

## 2021-04-29 ENCOUNTER — Encounter (HOSPITAL_COMMUNITY): Payer: Self-pay

## 2021-04-29 ENCOUNTER — Emergency Department (HOSPITAL_COMMUNITY)
Admission: EM | Admit: 2021-04-29 | Discharge: 2021-04-29 | Disposition: A | Discharge status: Home / Self Care | Payer: Medicaid Other | Attending: Emergency Medicine | Admitting: Emergency Medicine

## 2021-04-29 ENCOUNTER — Other Ambulatory Visit: Payer: Self-pay

## 2021-04-29 DIAGNOSIS — J45909 Unspecified asthma, uncomplicated: Secondary | ICD-10-CM | POA: Diagnosis not present

## 2021-04-29 DIAGNOSIS — Z7982 Long term (current) use of aspirin: Secondary | ICD-10-CM | POA: Diagnosis not present

## 2021-04-29 DIAGNOSIS — Z79899 Other long term (current) drug therapy: Secondary | ICD-10-CM | POA: Insufficient documentation

## 2021-04-29 DIAGNOSIS — Z8541 Personal history of malignant neoplasm of cervix uteri: Secondary | ICD-10-CM | POA: Diagnosis not present

## 2021-04-29 DIAGNOSIS — R21 Rash and other nonspecific skin eruption: Secondary | ICD-10-CM | POA: Diagnosis not present

## 2021-04-29 DIAGNOSIS — Z91040 Latex allergy status: Secondary | ICD-10-CM | POA: Diagnosis not present

## 2021-04-29 DIAGNOSIS — I10 Essential (primary) hypertension: Secondary | ICD-10-CM | POA: Diagnosis not present

## 2021-04-29 DIAGNOSIS — J449 Chronic obstructive pulmonary disease, unspecified: Secondary | ICD-10-CM | POA: Diagnosis not present

## 2021-04-29 MED ORDER — LORATADINE 10 MG PO TABS: 10.0000 mg | ORAL_TABLET | Freq: Every day | ORAL | 0 refills | Status: DC

## 2021-04-29 MED ORDER — ALBUTEROL SULFATE HFA 108 (90 BASE) MCG/ACT IN AERS: 2.0000 | INHALATION_SPRAY | Freq: Four times a day (QID) | RESPIRATORY_TRACT | 1 refills | Status: DC | PRN

## 2021-04-29 MED ORDER — PREDNISONE 10 MG PO TABS: 40.0000 mg | ORAL_TABLET | Freq: Every day | ORAL | 0 refills | Status: DC

## 2021-04-29 NOTE — ED Provider Notes (Signed)
[facility] Provider Note   CSN: [id number] Arrival date & time: 04/29/21  0841     History Chief Complaint  Patient presents with   Rash    Mary [name] is a 40 y.o. female.   Rash Associated symptoms: no fever and no shortness of breath    Patient presents with a rash.  Rash started this morning, she noticed it on her forearms bilaterally.  It is pruritic, has not been spreading anywhere.  She has not tried any alleviating factors, itching at it makes it worse.  Has not spread anywhere else on her body.  She has history of similar rashes with exposure to allergen gins.  States the rash for started after packing up boxes in her attic.  She is not having any difficulty breathing, chest pain, airway occlusion.  Past Medical History:  Diagnosis Date   Anxiety    Asthma    Bipolar 1 disorder ([place])    Bulging lumbar disc    BV (bacterial vaginosis) 02/18/2018   Cancer of cervix (HCC)    Carpal tunnel syndrome on right    Cervical cancer (HCC)    Chronic back pain    COPD (chronic obstructive pulmonary disease) (HCC)    Depression    Hypertension    Migraines    Physiological ovarian cysts    PTSD (post-traumatic stress disorder)    Rheumatoid arthritis (HCC)    S/P partial hysterectomy    Vaginal Pap smear, abnormal     Patient Active Problem List   Diagnosis Date Noted   Morbid obesity ([place]) 06/11/2020   Weight gain 06/11/2020   RLQ abdominal pain 06/11/2020   Encounter for screening fecal occult blood testing 06/11/2020   Well woman exam with routine gynecological exam 06/11/2020   Hypertension 06/11/2020   Pain of right side of body 12/02/2019   Chronic migraine 12/02/2019   BV (bacterial vaginosis) 02/18/2018   Screening examination for STD (sexually transmitted disease) 02/06/2018   Family planning 02/06/2018   History of cervical cancer 02/06/2018   Vaginal Pap smear following hysterectomy for malignancy 02/06/2018   Encounter for  well woman exam with routine gynecological exam 02/06/2018   Depression 02/06/2018   Tenderness of female pelvic organs 02/06/2018   Cancer of cervix ([place])    Right ovarian cyst 03/19/2014   Trichimoniasis 03/19/2014   DYSPNEA 11/26/2009   ASTHMA 11/23/2009    Past Surgical History:  Procedure Laterality Date   ABDOMINAL HYSTERECTOMY     ABDOMINAL HYSTERECTOMY     EYE SURGERY  age 59   Artificial right eye   ORIF ELBOW FRACTURE Left 11/18/2019   Procedure: OPEN REDUCTION WITH REPAIR VERSUS FRAGMENT EXCISION CAPITELLUM FRACTURE LEFT ELBOW AND LIGAMENTOUS RECONSTRUCTION AS NECESSARY;  Surgeon: Roseanne Kaufman, MD;  Location: [facility];  Service: Orthopedics;  Laterality: Left;  2 HRS     OB History     Gravida  3   Para  3   Term  3   Preterm      AB      Living  3      SAB      IAB      Ectopic      Multiple      Live Births              Family History  Problem Relation Age of Onset   Stroke Mother        TIA   Asthma Mother  Cancer Mother    Other Mother        blood platelets are dropping   Heart attack Father    Hypertension Father    Cancer Father        skin cancer   Stroke Father    Asthma Daughter    Sleep apnea Son    Diabetes Maternal Grandmother    Heart Problems Maternal Grandmother    Anuerysm Maternal Grandfather    Seizures Maternal Grandfather    Cancer Paternal Grandmother        breast   Fibromyalgia Sister    Cancer Sister        cervical   Cancer Sister        cervical   Cancer Brother        thyroid cancer   Cancer Maternal Aunt        breast cancer   Cancer Paternal Aunt     Social History   Tobacco Use   Smoking status: Never   Smokeless tobacco: Never  Vaping Use   Vaping Use: Never used  Substance Use Topics   Alcohol use: No   Drug use: No    Home Medications Prior to Admission medications   Medication Sig Start Date End Date Taking? Authorizing Provider  albuterol (VENTOLIN HFA) 108 (90 Base)  MCG/ACT inhaler Inhale 2 puffs into the lungs every 6 (six) hours as needed for wheezing or shortness of breath. 10/16/19   Soyla Dryer, PA-C  aspirin 81 MG chewable tablet Chew 81 mg by mouth in the morning.     [provider]  cholecalciferol (VITAMIN D3) 25 MCG (1000 UNIT) tablet Take 1,000 Units by mouth daily.    [provider]  doxycycline (VIBRAMYCIN) 100 MG capsule Take 1 capsule (100 mg total) by mouth 2 (two) times daily. 01/15/21   Rancour, Annie Main, MD  fluticasone (FLONASE) 50 MCG/ACT nasal spray Place 1 spray into both nostrils daily. 01/15/21   Rancour, Annie Main, MD  furosemide (LASIX) 40 MG tablet Take 40 mg by mouth daily.    [provider]  losartan (COZAAR) 50 MG tablet Take 1 tablet (50 mg total) by mouth daily. 06/11/20   Estill Dooms, NP  methocarbamol (ROBAXIN) 500 MG tablet Take 1 or 2 po Q 6hrs for muscle pain 09/05/20   Rolland Porter, MD  metroNIDAZOLE (FLAGYL) 500 MG tablet Take 1 tablet (500 mg total) by mouth 2 (two) times daily. 07/29/20   Cresenzo-Dishmon, Joaquim Lai, CNM  mometasone-formoterol (DULERA) 100-5 MCG/ACT AERO Inhale 2 puffs into the lungs 2 (two) times daily. 10/16/19   Soyla Dryer, PA-C  naproxen (NAPROSYN) 500 MG tablet Take 1 po BID with food prn pain 09/05/20   Rolland Porter, MD  ondansetron (ZOFRAN) 4 MG tablet Take 1 tablet (4 mg total) by mouth every 8 (eight) hours as needed. 09/05/20   Rolland Porter, MD  rizatriptan (MAXALT-MLT) 10 MG disintegrating tablet Take 1 tablet (10 mg total) by mouth as needed. May repeat in 2 hours if needed Patient not taking: Reported on 06/11/2020 12/02/19   Marcial Pacas, MD  vitamin B-12 (CYANOCOBALAMIN) 50 MCG tablet Take 50 mcg by mouth daily.    [provider]    Allergies    Buprenorphine hcl, Morphine and related, Penicillins, Gabapentin, Ibuprofen & acetaminophen, Latex, and Tylenol [acetaminophen]  Review of Systems   Review of Systems  Constitutional:  Negative for fever.   HENT:  Negative for trouble swallowing.   Respiratory:  Negative for shortness  of breath.   Cardiovascular:  Negative for chest pain.  Skin:  Positive for rash.   Physical Exam Updated Vital Signs BP (!) 147/96 (BP Location: Left Arm)   Pulse 69   Temp 97.8 F (36.6 C) (Oral)   Resp 17   Ht '5\' 9"'$  (1.753 m)   Wt 90.7 kg   SpO2 98%   BMI 29.53 kg/m   Physical Exam Vitals and nursing note reviewed. Exam conducted with a chaperone present.  Constitutional:      General: She is not in acute distress.    Appearance: Normal appearance. She is obese.  HENT:     Head: Normocephalic and atraumatic.  Eyes:     General: No scleral icterus.    Extraocular Movements: Extraocular movements intact.     Pupils: Pupils are equal, round, and reactive to light.  Skin:    Coloration: Skin is not jaundiced.     Findings: Rash present.     Comments: Patient has erythematous rash on the anterior surface of her forearm.  No hives, negative dermatographia.  No fluctuance, no purulent material.  Neurological:     Mental Status: She is alert. Mental status is at baseline.     Coordination: Coordination normal.    ED Results / Procedures / Treatments   Labs (all labs ordered are listed, but only abnormal results are displayed) Labs Reviewed - No data to display  EKG None  Radiology No results found.  Procedures Procedures   Medications Ordered in ED Medications - No data to display  ED Course  I have reviewed the triage vital signs and the nursing notes.  Pertinent labs & imaging results that were available during my care of the patient were reviewed by me and considered in my medical decision making (see chart for details).    MDM Rules/Calculators/A&P                           Patient is mildly hypertensive, otherwise vitals are stable.  No recent antibiotic use or concern for Stevens-Johnson or TE N.  her airway is patent with and without any rash involvement.  The rash is  not spreading anywhere else, she is not having any chest pain or shortness of breath.  This does not appear to be anaphylaxis.  We will treat with antihistamine and short course of steroids.  Patient vitals are stable and she is appropriate for discharge at this time.  Final Clinical Impression(s) / ED Diagnoses Final diagnoses:  None    Rx / DC Orders ED Discharge Orders     None        Sherrill Raring, PA-C 04/29/21 [id number]    Milton Ferguson, MD 04/30/21 1212

## 2021-04-29 NOTE — Discharge Instructions (Signed)
Take Benadryl at night until the rash resolves.  You can also take 10 mg of Claritin in the morning as this medicine will make you is tired. Take prednisone twice daily for the next 5 days.  I have also sent you a refill of your Ventolin inhaler, please make sure you schedule a follow-up appointment for reevaluation with your primary care doctor for future refills.

## 2021-04-29 NOTE — ED Triage Notes (Signed)
Pt came to ED c/o rash to arms that are itching since this morning. No c/o breathing.

## 2021-05-24 ENCOUNTER — Other Ambulatory Visit (HOSPITAL_COMMUNITY)
Admission: RE | Admit: 2021-05-24 | Discharge: 2021-05-24 | Disposition: A | Discharge status: Home / Self Care | Payer: Medicaid Other | Source: Ambulatory Visit | Attending: Family Medicine | Admitting: Family Medicine

## 2021-05-24 ENCOUNTER — Encounter: Payer: Self-pay | Admitting: Family Medicine

## 2021-05-24 ENCOUNTER — Other Ambulatory Visit: Payer: Self-pay

## 2021-05-24 ENCOUNTER — Ambulatory Visit (INDEPENDENT_AMBULATORY_CARE_PROVIDER_SITE_OTHER): Payer: Medicaid Other | Admitting: Family Medicine

## 2021-05-24 VITALS — BP 146/95 | HR 63 | Ht 69.0 in | Wt 320.0 lb

## 2021-05-24 DIAGNOSIS — F411 Generalized anxiety disorder: Secondary | ICD-10-CM

## 2021-05-24 DIAGNOSIS — Z23 Encounter for immunization: Secondary | ICD-10-CM | POA: Diagnosis not present

## 2021-05-24 DIAGNOSIS — Z79899 Other long term (current) drug therapy: Secondary | ICD-10-CM | POA: Diagnosis not present

## 2021-05-24 DIAGNOSIS — Z7689 Persons encountering health services in other specified circumstances: Secondary | ICD-10-CM | POA: Diagnosis not present

## 2021-05-24 DIAGNOSIS — G894 Chronic pain syndrome: Secondary | ICD-10-CM | POA: Diagnosis not present

## 2021-05-24 DIAGNOSIS — F339 Major depressive disorder, recurrent, unspecified: Secondary | ICD-10-CM | POA: Diagnosis not present

## 2021-05-24 DIAGNOSIS — R9431 Abnormal electrocardiogram [ECG] [EKG]: Secondary | ICD-10-CM | POA: Insufficient documentation

## 2021-05-24 DIAGNOSIS — F4310 Post-traumatic stress disorder, unspecified: Secondary | ICD-10-CM | POA: Diagnosis not present

## 2021-05-24 DIAGNOSIS — I10 Essential (primary) hypertension: Secondary | ICD-10-CM

## 2021-05-24 DIAGNOSIS — G43019 Migraine without aura, intractable, without status migrainosus: Secondary | ICD-10-CM | POA: Diagnosis not present

## 2021-05-24 DIAGNOSIS — J309 Allergic rhinitis, unspecified: Secondary | ICD-10-CM | POA: Insufficient documentation

## 2021-05-24 DIAGNOSIS — J410 Simple chronic bronchitis: Secondary | ICD-10-CM | POA: Diagnosis not present

## 2021-05-24 MED ORDER — NURTEC 75 MG PO TBDP: 75.0000 mg | ORAL_TABLET | ORAL | 3 refills | Status: DC

## 2021-05-24 MED ORDER — LOSARTAN POTASSIUM 50 MG PO TABS: 50.0000 mg | ORAL_TABLET | Freq: Every day | ORAL | 1 refills | Status: DC

## 2021-05-24 MED ORDER — FUROSEMIDE 40 MG PO TABS: 40.0000 mg | ORAL_TABLET | Freq: Every day | ORAL | 3 refills | Status: DC

## 2021-05-24 MED ORDER — DULERA 100-5 MCG/ACT IN AERO: 2.0000 | INHALATION_SPRAY | Freq: Two times a day (BID) | RESPIRATORY_TRACT | 1 refills | Status: DC

## 2021-05-24 MED ORDER — ALBUTEROL SULFATE HFA 108 (90 BASE) MCG/ACT IN AERS: 2.0000 | INHALATION_SPRAY | Freq: Four times a day (QID) | RESPIRATORY_TRACT | 5 refills | Status: DC | PRN
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
## 2021-05-25 LAB — CBC WITH DIFFERENTIAL/PLATELET
Basophils Absolute: 0 10*3/uL (ref 0.0–0.2)
Basos: 0 %
EOS (ABSOLUTE): 0.1 10*3/uL (ref 0.0–0.4)
Eos: 1 %
Hematocrit: 36.3 % (ref 34.0–46.6)
Hemoglobin: 12.2 g/dL (ref 11.1–15.9)
Immature Grans (Abs): 0 10*3/uL (ref 0.0–0.1)
Immature Granulocytes: 0 %
Lymphocytes Absolute: 1.3 10*3/uL (ref 0.7–3.1)
Lymphs: 17 %
MCH: 29 pg (ref 26.6–33.0)
MCHC: 33.6 g/dL (ref 31.5–35.7)
MCV: 86 fL (ref 79–97)
Monocytes Absolute: 0.4 10*3/uL (ref 0.1–0.9)
Monocytes: 5 %
Neutrophils Absolute: 5.8 10*3/uL (ref 1.4–7.0)
Neutrophils: 77 %
Platelets: 240 10*3/uL (ref 150–450)
RBC: 4.21 x10E6/uL (ref 3.77–5.28)
RDW: 13 % (ref 11.7–15.4)
WBC: 7.6 10*3/uL (ref 3.4–10.8)

## 2021-05-25 LAB — CMP14+EGFR
ALT: 16 IU/L (ref 0–32)
AST: 16 IU/L (ref 0–40)
Albumin/Globulin Ratio: 1.7 (ref 1.2–2.2)
Albumin: 4.5 g/dL (ref 3.8–4.8)
Alkaline Phosphatase: 111 IU/L (ref 44–121)
BUN/Creatinine Ratio: 11 (ref 9–23)
BUN: 11 mg/dL (ref 6–24)
Bilirubin Total: 0.3 mg/dL (ref 0.0–1.2)
CO2: 25 mmol/L (ref 20–29)
Calcium: 9.4 mg/dL (ref 8.7–10.2)
Chloride: 103 mmol/L (ref 96–106)
Creatinine, Ser: 0.96 mg/dL (ref 0.57–1.00)
Globulin, Total: 2.6 g/dL (ref 1.5–4.5)
Glucose: 83 mg/dL (ref 70–99)
Potassium: 4.4 mmol/L (ref 3.5–5.2)
Sodium: 143 mmol/L (ref 134–144)
Total Protein: 7.1 g/dL (ref 6.0–8.5)
eGFR: 76 mL/min/{1.73_m2} (ref >59)

## 2021-05-25 LAB — MICROALBUMIN / CREATININE URINE RATIO
Creatinine, Urine: 123.7 mg/dL
Microalb/Creat Ratio: 6 mg/g creat (ref 0–29)
Microalbumin, Urine: 7.7 ug/mL

## 2021-05-25 LAB — URINE CYTOLOGY ANCILLARY ONLY
Candida Urine: NEGATIVE
Chlamydia: NEGATIVE
Comment: NEGATIVE
Comment: NEGATIVE
Comment: NORMAL
Neisseria Gonorrhea: NEGATIVE
Trichomonas: NEGATIVE

## 2021-05-25 LAB — LIPID PANEL
Chol/HDL Ratio: 2.8 ratio (ref 0.0–4.4)
Cholesterol, Total: 143 mg/dL (ref 100–199)
HDL: 51 mg/dL (ref >39)
LDL Chol Calc (NIH): 78 mg/dL (ref 0–99)
Triglycerides: 69 mg/dL (ref 0–149)
VLDL Cholesterol Cal: 14 mg/dL (ref 5–40)

## 2021-05-25 LAB — THYROID PANEL WITH TSH
Free Thyroxine Index: 2.4 (ref 1.2–4.9)
T3 Uptake Ratio: 24 % (ref 24–39)
T4, Total: 10.1 ug/dL (ref 4.5–12.0)
TSH: 1.26 u[IU]/mL (ref 0.450–4.500)

## 2021-05-25 LAB — HIV ANTIBODY (ROUTINE TESTING W REFLEX): HIV Screen 4th Generation wRfx: NONREACTIVE

## 2021-05-25 LAB — VITAMIN B12: Vitamin B-12: 247 pg/mL (ref 232–1245)

## 2021-05-25 LAB — HEPATITIS C ANTIBODY: Hep C Virus Ab: <0.1 s/co ratio (ref 0.0–0.9)

## 2021-05-25 LAB — VITAMIN D 25 HYDROXY (VIT D DEFICIENCY, FRACTURES): Vit D, 25-Hydroxy: 21.7 ng/mL — ABNORMAL LOW (ref 30.0–100.0)

## 2021-05-26 ENCOUNTER — Telehealth: Payer: Self-pay | Admitting: *Deleted

## 2021-05-26 ENCOUNTER — Other Ambulatory Visit: Payer: Self-pay | Admitting: Family Medicine

## 2021-05-26 DIAGNOSIS — G43019 Migraine without aura, intractable, without status migrainosus: Secondary | ICD-10-CM

## 2021-05-26 MED ORDER — QULIPTA 10 MG PO TABS: 10.0000 mg | ORAL_TABLET | Freq: Every day | ORAL | 3 refills | Status: DC

## 2021-05-26 NOTE — Telephone Encounter (Signed)
Nurtec was denied by [place] [employer].  Approval requires 15 migraine episodes or less per month.  Patient reports migraines daily

## 2021-05-29 LAB — TOXASSURE SELECT 13 (MW), URINE

## 2021-06-01 ENCOUNTER — Ambulatory Visit: Payer: Medicaid Other | Admitting: Family Medicine

## 2021-06-06 DIAGNOSIS — M722 Plantar fascial fibromatosis: Secondary | ICD-10-CM | POA: Diagnosis not present

## 2021-06-06 DIAGNOSIS — M19071 Primary osteoarthritis, right ankle and foot: Secondary | ICD-10-CM | POA: Diagnosis not present

## 2021-06-20 ENCOUNTER — Ambulatory Visit (INDEPENDENT_AMBULATORY_CARE_PROVIDER_SITE_OTHER): Payer: Medicaid Other | Admitting: Cardiology

## 2021-06-20 ENCOUNTER — Encounter: Payer: Self-pay | Admitting: Cardiology

## 2021-06-20 ENCOUNTER — Other Ambulatory Visit: Payer: Self-pay

## 2021-06-20 VITALS — BP 140/90 | HR 72 | Ht 69.0 in | Wt 334.0 lb

## 2021-06-20 DIAGNOSIS — I10 Essential (primary) hypertension: Secondary | ICD-10-CM

## 2021-06-20 DIAGNOSIS — I208 Other forms of angina pectoris: Secondary | ICD-10-CM

## 2021-06-20 DIAGNOSIS — R9431 Abnormal electrocardiogram [ECG] [EKG]: Secondary | ICD-10-CM | POA: Diagnosis not present

## 2021-06-20 DIAGNOSIS — R079 Chest pain, unspecified: Secondary | ICD-10-CM | POA: Diagnosis not present

## 2021-06-20 DIAGNOSIS — R0602 Shortness of breath: Secondary | ICD-10-CM | POA: Diagnosis not present

## 2021-06-20 MED ORDER — METOPROLOL TARTRATE 50 MG PO TABS: 50.0000 mg | ORAL_TABLET | ORAL | 0 refills | Status: DC

## 2021-06-20 NOTE — Assessment & Plan Note (Signed)
Currently on losartan 50 mg a day as well as furosemide 40 mg a day.  Darla Lesches, FNP, is currently titrating medications.  Agree with plan.

## 2021-06-20 NOTE — Patient Instructions (Signed)
Medication Instructions:  The current medical regimen is effective;  continue present plan and medications.  *If you need a refill on your cardiac medications before your next appointment, please call your pharmacy*  Testing/Procedures: Your physician has requested that you have an echocardiogram. Echocardiography is a painless test that uses sound waves to create images of your heart. It provides your doctor with information about the size and shape of your heart and how well your heart's chambers and valves are working. This procedure takes approximately one hour. There are no restrictions for this procedure.    Your cardiac CT will be scheduled at:   [facility] [address] [place], [place] [address] [phone]  Please arrive at the [facility] main entrance (entrance A) of [facility] 30 minutes prior to test start time. You can use the FREE valet parking offered at the main entrance (encouraged to control the heart rate for the test) Proceed to the [facility] Radiology Department (first floor) to check-in and test prep.  Please follow these instructions carefully (unless otherwise directed):  On the Night Before the Test: Be sure to Drink plenty of water. Do not consume any caffeinated/decaffeinated beverages or chocolate 12 hours prior to your test. Do not take any antihistamines 12 hours prior to your test.  On the Day of the Test: Drink plenty of water until 1 hour prior to the test. Do not eat any food 4 hours prior to the test. You may take your regular medications prior to the test.  Take metoprolol (Lopressor) two hours prior to test. HOLD Furosemide/Hydrochlorothiazide morning of the test. FEMALES- please wear underwire-free bra if available, avoid dresses & tight clothing  After the Test: Drink plenty of water. After receiving IV contrast, you may experience a mild flushed feeling. This is normal. On occasion, you may experience a mild  rash up to 24 hours after the test. This is not dangerous. If this occurs, you can take Benadryl 25 mg and increase your fluid intake. If you experience trouble breathing, this can be serious. If it is severe call 911 IMMEDIATELY. If it is mild, please call our office.  Please allow 2-4 weeks for scheduling of routine cardiac CTs. Some insurance companies require a pre-authorization which may delay scheduling of this test.   For non-scheduling related questions, please contact the cardiac imaging nurse navigator should you have any questions/concerns: Marchia Bond, Cardiac Imaging Nurse Navigator Gordy Clement, Cardiac Imaging Nurse Navigator [facility] Direct Office Dial: [phone]   For scheduling needs, including cancellations and rescheduling, please call [place], [phone].  Follow-Up: At [facility], you and your health needs are our priority.  As part of our continuing mission to provide you with exceptional heart care, we have created designated Provider Care Teams.  These Care Teams include your primary Cardiologist (physician) and Advanced Practice Providers (APPs -  Physician Assistants and Nurse Practitioners) who all work together to provide you with the care you need, when you need it.  We recommend signing up for the patient portal called "MyChart".  Sign up information is provided on this After Visit Summary.  MyChart is used to connect with patients for Virtual Visits (Telemedicine).  Patients are able to view lab/test results, encounter notes, upcoming appointments, etc.  Non-urgent messages can be sent to your provider as well.   To learn more about what you can do with MyChart, go to [email or web address].    Your next appointment:  Follow up as needed after the above testing.  Thank you for choosing [facility]!!

## 2021-06-20 NOTE — Assessment & Plan Note (Signed)
Checking echocardiogram to ensure proper structure and function of her heart.  Has been diagnosed with COPD although she has not smoked.  Has been exposed to secondhand smoke she states.

## 2021-06-20 NOTE — Assessment & Plan Note (Signed)
T wave inversions noted in the anterior leads.  Potential ischemic change.  Checking echocardiogram as well as CT coronary.

## 2021-06-20 NOTE — Progress Notes (Signed)
Cardiology Office Note:    Date:  06/20/2021   ID:  Mary [name], DOB 1980-10-27, MRN [id number]  PCP:  Baruch Gouty, FNP   [facility] Providers Cardiologist:  None     Referring MD: Baruch Gouty, FNP    History of Present Illness:    Mary [name] is a 40 y.o. female here for the evaluation of abnormal EKG at the request of Darla Lesches, NP.  Her EKG showed anterior T wave inversion.  Has hypertension, depression,.  Has been on furosemide 40 mg a day as well as losartan 50 mg a day.  Has battled with morbid obesity for quite some time, lower extremity edema.  Has experienced both chest pain as well as shortness of breath.  She has young children and it is challenging for her to keep up with them.  She has plantar fasciitis as well which inhibits her exercise.  Dr. Amedeo Plenty performed surgery on her left elbow previously.  She has experienced intermittent chest discomfort moderate in severity left of sternum with radiation down her left arm.  Her left arm usually stays cold she states from her surgery.  When she exerts herself she can sometimes feel some discomfort.  No DM Never smoked, no ETOH.  Has experienced 2nd hand smoke.  Has been diagnosed with COPD based upon her secondhand smoke.  Right eye prosthetic.   Past Medical History:  Diagnosis Date   Allergy    Anxiety    Asthma    Bipolar 1 disorder (HCC)    Bulging lumbar disc    BV (bacterial vaginosis) 02/18/2018   Cancer of cervix (HCC)    Carpal tunnel syndrome on right    Cervical cancer (HCC)    Chronic back pain    COPD (chronic obstructive pulmonary disease) (HCC)    Depression    Hypertension    Migraines    Physiological ovarian cysts    PTSD (post-traumatic stress disorder)    Rheumatoid arthritis (HCC)    S/P partial hysterectomy    Vaginal Pap smear, abnormal     Past Surgical History:  Procedure Laterality Date   ABDOMINAL HYSTERECTOMY     ABDOMINAL HYSTERECTOMY     EYE SURGERY   age 77   Artificial right eye   ORIF ELBOW FRACTURE Left 11/18/2019   Procedure: OPEN REDUCTION WITH REPAIR VERSUS FRAGMENT EXCISION CAPITELLUM FRACTURE LEFT ELBOW AND LIGAMENTOUS RECONSTRUCTION AS NECESSARY;  Surgeon: Roseanne Kaufman, MD;  Location: [facility];  Service: Orthopedics;  Laterality: Left;  2 HRS    Current Medications: Current Meds  Medication Sig   albuterol (VENTOLIN HFA) 108 (90 Base) MCG/ACT inhaler Inhale 2 puffs into the lungs every 6 (six) hours as needed for wheezing or shortness of breath.   Atogepant (QULIPTA) 10 MG TABS Take 10 mg by mouth daily.   cholecalciferol (VITAMIN D3) 25 MCG (1000 UNIT) tablet Take 1,000 Units by mouth daily.   fluticasone (FLONASE) 50 MCG/ACT nasal spray Place 1 spray into both nostrils daily.   furosemide (LASIX) 40 MG tablet Take 1 tablet (40 mg total) by mouth daily.   losartan (COZAAR) 50 MG tablet Take 1 tablet (50 mg total) by mouth daily.   metoprolol tartrate (LOPRESSOR) 50 MG tablet Take 1 tablet (50 mg total) by mouth as directed. Take (1) tablet 2 hours before your CT scan   mometasone-formoterol (DULERA) 100-5 MCG/ACT AERO Inhale 2 puffs into the lungs 2 (two) times daily.     Allergies:  Buprenorphine hcl, Morphine, Morphine and related, Penicillins, Gabapentin, Nsaids, Ibuprofen & acetaminophen, Latex, and Tylenol [acetaminophen]   Social History   Socioeconomic History   Marital status: Divorced    Spouse name: Not on file   Number of children: 3   Years of education: 12   Highest education level: High school graduate  Occupational History   Occupation: Seeking disability  Tobacco Use   Smoking status: Never   Smokeless tobacco: Never  Vaping Use   Vaping Use: Never used  Substance and Sexual Activity   Alcohol use: No   Drug use: No   Sexual activity: Yes    Birth control/protection: Surgical    Comment: hyst  Other Topics Concern   Not on file  Social History Narrative   Lives at home with father.    Right-handed.   No daily caffeine per day.   Social Determinants of Health   Financial Resource Strain: Not on file  Food Insecurity: Not on file  Transportation Needs: Not on file  Physical Activity: Not on file  Stress: Not on file  Social Connections: Not on file     Family History: The patient's family history includes Anuerysm in her maternal grandfather; Asthma in her daughter and mother; Cancer in her brother, father, maternal aunt, mother, paternal aunt, paternal grandmother, sister, and sister; Diabetes in her maternal grandmother; Fibromyalgia in her sister; Heart Problems in her maternal grandmother; Heart attack in her father; Hypertension in her father; Other in her mother; Seizures in her maternal grandfather; Sleep apnea in her son; Stroke in her father and mother.  ROS:   Please see the history of present illness.    No fevers chills, N/V.  All other systems reviewed and are negative.  EKGs/Labs/Other Studies Reviewed:    The following studies were reviewed today: Prior office notes, lab work reviewed, LDL 78 hemoglobin A1c 4.7 creatinine 0.96  EKG: 05/24/2021-heart rate 64 bpm sinus rhythm with T wave inversion in the anterior leads consider ischemia.  Recent Labs: 05/24/2021: ALT 16; BUN 11; Creatinine, Ser 0.96; Hemoglobin 12.2; Platelets 240; Potassium 4.4; Sodium 143; TSH 1.260  Recent Lipid Panel    Component Value Date/Time   CHOL 143 05/24/2021 1100   TRIG 69 05/24/2021 1100   HDL 51 05/24/2021 1100   CHOLHDL 2.8 05/24/2021 1100   CHOLHDL 2.8 10/17/2019 0842   VLDL 10 10/17/2019 0842   LDLCALC 78 05/24/2021 1100     Risk Assessment/Calculations:          Physical Exam:    VS:  BP 140/90 (BP Location: Right Arm)   Pulse 72   Ht 5\' 9"  (1.753 m)   Wt (!) 334 lb (151.5 kg)   SpO2 99%   BMI 49.32 kg/m     Wt Readings from Last 3 Encounters:  06/20/21 (!) 334 lb (151.5 kg)  05/24/21 (!) 320 lb (145.2 kg)  04/29/21 200 lb (90.7 kg)      GEN:  Well nourished, well developed in no acute distress HEENT: Right eye prosthesis NECK: No JVD; No carotid bruits LYMPHATICS: No lymphadenopathy CARDIAC: RRR, 2/6 systolic murmur left sternal border, no rubs, gallops RESPIRATORY:  Clear to auscultation without rales, wheezing or rhonchi  ABDOMEN: Soft, non-tender, non-distended MUSCULOSKELETAL: Chronic lower extremity edema/adipose; No deformity, left elbow surgery scar noted SKIN: Warm and dry NEUROLOGIC:  Alert and oriented x 3 PSYCHIATRIC:  Normal affect   ASSESSMENT:    1. Other forms of angina pectoris ([place])   2. Chest  pain of uncertain etiology   3. Primary hypertension   4. Abnormal EKG   5. Shortness of breath    PLAN:    In order of problems listed above:  Chest pain of uncertain etiology We will check a coronary CT scan in [facility].  Metoprolol per protocol.  This will be an excellent test for her to see whether or not coronary plaque is developing or if severe stenosis is perhaps causing her chest discomfort.  This will help guide therapy.  With her abnormal EKG showing T wave inversion in the anterior leads as well as symptoms of chest pain and shortness of breath, hypertension this will be a good study for her.  I understand that her BMI is elevated however I think a nuclear stress test would be challenging from a sensitivity specificity standpoint.  If symptoms were to become more worrisome, she knows to seek further medical attention, hospital if necessary.  Primary hypertension Currently on losartan 50 mg a day as well as furosemide 40 mg a day.  Darla Lesches, FNP, is currently titrating medications.  Agree with plan.  Abnormal EKG T wave inversions noted in the anterior leads.  Potential ischemic change.  Checking echocardiogram as well as CT coronary.  Dyspnea on exertion Checking echocardiogram to ensure proper structure and function of her heart.  Has been diagnosed with COPD although  she has not smoked.  Has been exposed to secondhand smoke she states.        Medication Adjustments/Labs and Tests Ordered: Current medicines are reviewed at length with the patient today.  Concerns regarding medicines are outlined above.  Orders Placed This Encounter  Procedures   CT CORONARY MORPH W/CTA COR W/SCORE W/CA W/CM &/OR WO/CM   ECHOCARDIOGRAM COMPLETE    Meds ordered this encounter  Medications   metoprolol tartrate (LOPRESSOR) 50 MG tablet    Sig: Take 1 tablet (50 mg total) by mouth as directed. Take (1) tablet 2 hours before your CT scan    Dispense:  1 tablet    Refill:  0     Patient Instructions  Medication Instructions:  The current medical regimen is effective;  continue present plan and medications.  *If you need a refill on your cardiac medications before your next appointment, please call your pharmacy*  Testing/Procedures: Your physician has requested that you have an echocardiogram. Echocardiography is a painless test that uses sound waves to create images of your heart. It provides your doctor with information about the size and shape of your heart and how well your heart's chambers and valves are working. This procedure takes approximately one hour. There are no restrictions for this procedure.    Your cardiac CT will be scheduled at:   [facility] [address] [place], [place] [address] [phone]  Please arrive at the [facility] main entrance (entrance A) of [facility] 30 minutes prior to test start time. You can use the FREE valet parking offered at the main entrance (encouraged to control the heart rate for the test) Proceed to the [facility] Radiology Department (first floor) to check-in and test prep.  Please follow these instructions carefully (unless otherwise directed):  On the Night Before the Test: Be sure to Drink plenty of water. Do not consume any caffeinated/decaffeinated beverages or chocolate 12  hours prior to your test. Do not take any antihistamines 12 hours prior to your test.  On the Day of the Test: Drink plenty of  water until 1 hour prior to the test. Do not eat any food 4 hours prior to the test. You may take your regular medications prior to the test.  Take metoprolol (Lopressor) two hours prior to test. HOLD Furosemide/Hydrochlorothiazide morning of the test. FEMALES- please wear underwire-free bra if available, avoid dresses & tight clothing  After the Test: Drink plenty of water. After receiving IV contrast, you may experience a mild flushed feeling. This is normal. On occasion, you may experience a mild rash up to 24 hours after the test. This is not dangerous. If this occurs, you can take Benadryl 25 mg and increase your fluid intake. If you experience trouble breathing, this can be serious. If it is severe call 911 IMMEDIATELY. If it is mild, please call our office.  Please allow 2-4 weeks for scheduling of routine cardiac CTs. Some insurance companies require a pre-authorization which may delay scheduling of this test.   For non-scheduling related questions, please contact the cardiac imaging nurse navigator should you have any questions/concerns: Marchia Bond, Cardiac Imaging Nurse Navigator Gordy Clement, Cardiac Imaging Nurse Navigator [facility] Direct Office Dial: [phone]   For scheduling needs, including cancellations and rescheduling, please call [place], [phone].  Follow-Up: At [facility], you and your health needs are our priority.  As part of our continuing mission to provide you with exceptional heart care, we have created designated Provider Care Teams.  These Care Teams include your primary Cardiologist (physician) and Advanced Practice Providers (APPs -  Physician Assistants and Nurse Practitioners) who all work together to provide you with the care you need, when you need it.  We recommend signing up for  the patient portal called "MyChart".  Sign up information is provided on this After Visit Summary.  MyChart is used to connect with patients for Virtual Visits (Telemedicine).  Patients are able to view lab/test results, encounter notes, upcoming appointments, etc.  Non-urgent messages can be sent to your provider as well.   To learn more about what you can do with MyChart, go to [email or web address].    Your next appointment:   Follow up as needed after the above testing.  Thank you for choosing [facility]!!     Signed, Candee Furbish, MD  06/20/2021 9:09 AM    [place]

## 2021-06-20 NOTE — Assessment & Plan Note (Addendum)
We will check a coronary CT scan in [facility] at [facility].  Metoprolol per protocol.  This will be an excellent test for her to see whether or not coronary plaque is developing or if severe stenosis is perhaps causing her chest discomfort.  This will help guide therapy.  With her abnormal EKG showing T wave inversion in the anterior leads as well as symptoms of chest pain and shortness of breath, hypertension this will be a good study for her.  I understand that her BMI is elevated however I think a nuclear stress test would be challenging from a sensitivity specificity standpoint.  If symptoms were to become more worrisome, she knows to seek further medical attention, hospital if necessary.

## 2021-06-21 ENCOUNTER — Ambulatory Visit (INDEPENDENT_AMBULATORY_CARE_PROVIDER_SITE_OTHER): Payer: Medicaid Other | Admitting: Family Medicine

## 2021-06-21 ENCOUNTER — Encounter: Payer: Self-pay | Admitting: Family Medicine

## 2021-06-21 VITALS — BP 134/96 | HR 68 | Temp 97.7°F | Ht 69.0 in | Wt 331.0 lb

## 2021-06-21 DIAGNOSIS — G43019 Migraine without aura, intractable, without status migrainosus: Secondary | ICD-10-CM | POA: Diagnosis not present

## 2021-06-21 DIAGNOSIS — I10 Essential (primary) hypertension: Secondary | ICD-10-CM | POA: Diagnosis not present

## 2021-06-21 DIAGNOSIS — E8881 Metabolic syndrome: Secondary | ICD-10-CM

## 2021-06-21 DIAGNOSIS — R9431 Abnormal electrocardiogram [ECG] [EKG]: Secondary | ICD-10-CM

## 2021-06-21 MED ORDER — OZEMPIC (0.25 OR 0.5 MG/DOSE) 2 MG/1.5ML ~~LOC~~ SOPN: 0.2500 mg | PEN_INJECTOR | SUBCUTANEOUS | 0 refills | Status: DC

## 2021-06-21 MED ORDER — OZEMPIC (0.25 OR 0.5 MG/DOSE) 2 MG/1.5ML ~~LOC~~ SOPN: 0.5000 mg | PEN_INJECTOR | SUBCUTANEOUS | 0 refills | Status: DC

## 2021-06-21 NOTE — Progress Notes (Signed)
Subjective:  Patient ID: Mary [name], female    DOB: [birth year], 40 y.o.   MRN: [id number]  Patient Care Team: Baruch Gouty, FNP as PCP - General (Family Medicine)   Chief Complaint:  Migraine   HPI: Mary [name] is a 40 y.o. female presenting on 06/21/2021 for Migraine   Pt presents today for follow up after initiation of Qulipta for migraine headache prevention. She was having more than 8 headache days per month prior to initiation, states she is now having less than 1 headache day per month. Her blood pressure has been well controlled on current regimen. She saw cardiology yesterday due to abnormal EKG and murmur. She is scheduled for CT coronary arteries and echo for evaluation. She denies chest pain but does have DOE. She has been dieting and exercising without changes in weight and is very concerned about her weight and health due to weight. She states she has been trying with diet and exercise but does not see a decline in weight.      Relevant past medical, surgical, family, and social history reviewed and updated as indicated.  Allergies and medications reviewed and updated. Data reviewed: Chart in Epic.   Past Medical History:  Diagnosis Date   Allergy    Anxiety    Asthma    Bipolar 1 disorder (HCC)    Bulging lumbar disc    BV (bacterial vaginosis) 02/18/2018   Cancer of cervix (HCC)    Carpal tunnel syndrome on right    Cervical cancer (HCC)    Chronic back pain    COPD (chronic obstructive pulmonary disease) (HCC)    Depression    Hypertension    Migraines    Physiological ovarian cysts    PTSD (post-traumatic stress disorder)    Rheumatoid arthritis (HCC)    S/P partial hysterectomy    Vaginal Pap smear, abnormal     Past Surgical History:  Procedure Laterality Date   ABDOMINAL HYSTERECTOMY     ABDOMINAL HYSTERECTOMY     EYE SURGERY  age 66   Artificial right eye   ORIF ELBOW FRACTURE Left 11/18/2019   Procedure: OPEN REDUCTION WITH  REPAIR VERSUS FRAGMENT EXCISION CAPITELLUM FRACTURE LEFT ELBOW AND LIGAMENTOUS RECONSTRUCTION AS NECESSARY;  Surgeon: Roseanne Kaufman, MD;  Location: [facility];  Service: Orthopedics;  Laterality: Left;  2 HRS    Social History   Socioeconomic History   Marital status: Divorced    Spouse name: Not on file   Number of children: 3   Years of education: 12   Highest education level: High school graduate  Occupational History   Occupation: Seeking disability  Tobacco Use   Smoking status: Never   Smokeless tobacco: Never  Vaping Use   Vaping Use: Never used  Substance and Sexual Activity   Alcohol use: No   Drug use: No   Sexual activity: Yes    Birth control/protection: Surgical    Comment: hyst  Other Topics Concern   Not on file  Social History Narrative   Lives at home with father.   Right-handed.   No daily caffeine per day.   Social Determinants of Health   Financial Resource Strain: Not on file  Food Insecurity: Not on file  Transportation Needs: Not on file  Physical Activity: Not on file  Stress: Not on file  Social Connections: Not on file  Intimate Partner Violence: Not on file    Outpatient Encounter Medications as of 06/21/2021  Medication  Sig   albuterol (VENTOLIN HFA) 108 (90 Base) MCG/ACT inhaler Inhale 2 puffs into the lungs every 6 (six) hours as needed for wheezing or shortness of breath.   Atogepant (QULIPTA) 10 MG TABS Take 10 mg by mouth daily.   cholecalciferol (VITAMIN D3) 25 MCG (1000 UNIT) tablet Take 1,000 Units by mouth daily.   fluticasone (FLONASE) 50 MCG/ACT nasal spray Place 1 spray into both nostrils daily.   furosemide (LASIX) 40 MG tablet Take 1 tablet (40 mg total) by mouth daily.   losartan (COZAAR) 50 MG tablet Take 1 tablet (50 mg total) by mouth daily.   metoprolol tartrate (LOPRESSOR) 50 MG tablet Take 1 tablet (50 mg total) by mouth as directed. Take (1) tablet 2 hours before your CT scan   mometasone-formoterol (DULERA) 100-5  MCG/ACT AERO Inhale 2 puffs into the lungs 2 (two) times daily.   Semaglutide,0.25 or 0.5MG/DOS, (OZEMPIC, 0.25 OR 0.5 MG/DOSE,) 2 MG/1.5ML SOPN Inject 0.25 mg into the skin once a week for 4 doses.   [START ON 07/20/2021] Semaglutide,0.25 or 0.5MG/DOS, (OZEMPIC, 0.25 OR 0.5 MG/DOSE,) 2 MG/1.5ML SOPN Inject 0.5 mg into the skin once a week for 4 doses.   No facility-administered encounter medications on file as of 06/21/2021.    Allergies  Allergen Reactions   Buprenorphine Hcl Anaphylaxis    "it stops my heart"   Morphine Anaphylaxis    i quit breathing    Morphine And Related Anaphylaxis    "it stops my heart"   Penicillins Anaphylaxis and Other (See Comments)    Has patient had a PCN reaction causing immediate rash, facial/tongue/throat swelling, SOB or lightheadedness with hypotension: Yes Has patient had a PCN reaction causing severe rash involving mucus membranes or skin necrosis: Yes Has patient had a PCN reaction that required hospitalization: Yes Has patient had a PCN reaction occurring within the last 10 years: No If all of the above answers are "NO", then may proceed with Cephalosporin use.    Gabapentin Other (See Comments)    Per patient "hallucination"   Nsaids    Ibuprofen & Acetaminophen Nausea And Vomiting   Latex Rash   Tylenol [Acetaminophen] Nausea And Vomiting    Review of Systems  Constitutional:  Positive for unexpected weight change. Negative for activity change, appetite change, chills, diaphoresis and fatigue.  HENT: Negative.    Eyes: Negative.  Negative for visual disturbance.  Respiratory:  Positive for shortness of breath (with exertion). Negative for chest tightness.   Cardiovascular:  Negative for chest pain, palpitations and leg swelling.  Gastrointestinal:  Negative for blood in stool, constipation and diarrhea.  Endocrine: Negative.   Genitourinary:  Negative for decreased urine volume, difficulty urinating, dysuria, frequency and urgency.   Musculoskeletal:  Negative for arthralgias and myalgias.  Skin: Negative.   Allergic/Immunologic: Negative.   Neurological:  Positive for headaches. Negative for tremors, syncope, facial asymmetry, speech difficulty and light-headedness.  Hematological: Negative.   Psychiatric/Behavioral:  Negative for confusion, hallucinations, sleep disturbance and suicidal ideas.   All other systems reviewed and are negative.      Objective:  BP (!) 134/96   Pulse 68   Temp 97.7 F (36.5 C)   Ht 5' 9"  (1.753 m)   Wt (!) 331 lb (150.1 kg)   SpO2 96%   BMI 48.88 kg/m    Wt Readings from Last 3 Encounters:  06/21/21 (!) 331 lb (150.1 kg)  06/20/21 (!) 334 lb (151.5 kg)  05/24/21 (!) 320 lb (145.2 kg)  Physical Exam Vitals and nursing note reviewed.  Constitutional:      General: She is not in acute distress.    Appearance: Normal appearance. She is well-developed and well-groomed. She is morbidly obese. She is not ill-appearing, toxic-appearing or diaphoretic.  HENT:     Head: Normocephalic and atraumatic.     Jaw: There is normal jaw occlusion.     Right Ear: Hearing normal.     Left Ear: Hearing normal.     Nose: Nose normal.     Mouth/Throat:     Lips: Pink.     Mouth: Mucous membranes are moist.     Pharynx: Oropharynx is clear. Uvula midline.  Eyes:     General: Lids are normal.     Comments: Right eye prosthesis  Neck:     Thyroid: No thyroid mass, thyromegaly or thyroid tenderness.     Vascular: No carotid bruit or JVD.     Trachea: Trachea and phonation normal.  Cardiovascular:     Rate and Rhythm: Normal rate and regular rhythm.     Chest Wall: PMI is not displaced.     Pulses: Normal pulses.     Heart sounds: Murmur heard.  Systolic murmur is present with a grade of 2/6.    No friction rub. No gallop.  Pulmonary:     Effort: Pulmonary effort is normal. No respiratory distress.     Breath sounds: Normal breath sounds. No wheezing.  Abdominal:     General: Bowel  sounds are normal. There is no distension or abdominal bruit.     Palpations: Abdomen is soft. There is no hepatomegaly or splenomegaly.     Tenderness: There is no abdominal tenderness. There is no right CVA tenderness or left CVA tenderness.     Hernia: No hernia is present.  Musculoskeletal:        General: Normal range of motion.     Cervical back: Normal range of motion and neck supple.     Right lower leg: 1+ Edema present.     Left lower leg: 1+ Edema present.  Lymphadenopathy:     Cervical: No cervical adenopathy.  Skin:    General: Skin is warm and dry.     Capillary Refill: Capillary refill takes less than 2 seconds.     Coloration: Skin is not cyanotic, jaundiced or pale.     Findings: No rash.  Neurological:     General: No focal deficit present.     Mental Status: She is alert and oriented to person, place, and time.     Sensory: Sensation is intact.     Motor: Motor function is intact.     Coordination: Coordination is intact.     Gait: Gait is intact.     Deep Tendon Reflexes: Reflexes are normal and symmetric.  Psychiatric:        Attention and Perception: Attention and perception normal.        Mood and Affect: Mood and affect normal.        Speech: Speech normal.        Behavior: Behavior normal. Behavior is cooperative.        Thought Content: Thought content normal.        Cognition and Memory: Cognition and memory normal.        Judgment: Judgment normal.    Results for orders placed or performed in visit on 05/24/21  CBC with Differential/Platelet  Result Value Ref Range   WBC 7.6 3.4 -  10.8 x10E3/uL   RBC 4.21 3.77 - 5.28 x10E6/uL   Hemoglobin 12.2 11.1 - 15.9 g/dL   Hematocrit 36.3 34.0 - 46.6 %   MCV 86 79 - 97 fL   MCH 29.0 26.6 - 33.0 pg   MCHC 33.6 31.5 - 35.7 g/dL   RDW 13.0 11.7 - 15.4 %   Platelets 240 150 - 450 x10E3/uL   Neutrophils 77 Not Estab. %   Lymphs 17 Not Estab. %   Monocytes 5 Not Estab. %   Eos 1 Not Estab. %   Basos 0 Not  Estab. %   Neutrophils Absolute 5.8 1.4 - 7.0 x10E3/uL   Lymphocytes Absolute 1.3 0.7 - 3.1 x10E3/uL   Monocytes Absolute 0.4 0.1 - 0.9 x10E3/uL   EOS (ABSOLUTE) 0.1 0.0 - 0.4 x10E3/uL   Basophils Absolute 0.0 0.0 - 0.2 x10E3/uL   Immature Granulocytes 0 Not Estab. %   Immature Grans (Abs) 0.0 0.0 - 0.1 x10E3/uL  CMP14+EGFR  Result Value Ref Range   Glucose 83 70 - 99 mg/dL   BUN 11 6 - 24 mg/dL   Creatinine, Ser 0.96 0.57 - 1.00 mg/dL   eGFR 76 >59 mL/min/1.73   BUN/Creatinine Ratio 11 9 - 23   Sodium 143 134 - 144 mmol/L   Potassium 4.4 3.5 - 5.2 mmol/L   Chloride 103 96 - 106 mmol/L   CO2 25 20 - 29 mmol/L   Calcium 9.4 8.7 - 10.2 mg/dL   Total Protein 7.1 6.0 - 8.5 g/dL   Albumin 4.5 3.8 - 4.8 g/dL   Globulin, Total 2.6 1.5 - 4.5 g/dL   Albumin/Globulin Ratio 1.7 1.2 - 2.2   Bilirubin Total 0.3 0.0 - 1.2 mg/dL   Alkaline Phosphatase 111 44 - 121 IU/L   AST 16 0 - 40 IU/L   ALT 16 0 - 32 IU/L  Lipid panel  Result Value Ref Range   Cholesterol, Total 143 100 - 199 mg/dL   Triglycerides 69 0 - 149 mg/dL   HDL 51 >39 mg/dL   VLDL Cholesterol Cal 14 5 - 40 mg/dL   LDL Chol Calc (NIH) 78 0 - 99 mg/dL   Chol/HDL Ratio 2.8 0.0 - 4.4 ratio  Thyroid Panel With TSH  Result Value Ref Range   TSH 1.260 0.450 - 4.500 uIU/mL   T4, Total 10.1 4.5 - 12.0 ug/dL   T3 Uptake Ratio 24 24 - 39 %   Free Thyroxine Index 2.4 1.2 - 4.9  VITAMIN D 25 Hydroxy (Vit-D Deficiency, Fractures)  Result Value Ref Range   Vit D, 25-Hydroxy 21.7 (L) 30.0 - 100.0 ng/mL  Vitamin B12  Result Value Ref Range   Vitamin B-12 247 232 - 1,245 pg/mL  Microalbumin / creatinine urine ratio  Result Value Ref Range   Creatinine, Urine 123.7 Not Estab. mg/dL   Microalbumin, Urine 7.7 Not Estab. ug/mL   Microalb/Creat Ratio 6 0 - 29 mg/g creat  Hepatitis C antibody  Result Value Ref Range   Hep C Virus Ab <0.1 0.0 - 0.9 s/co ratio  HIV Antibody (routine testing w rflx)  Result Value Ref Range   HIV Screen  4th Generation wRfx Non Reactive Non Reactive  ToxASSURE Select 13 (MW), Urine  Result Value Ref Range   Summary Note   Urine cytology ancillary only  Result Value Ref Range   Neisseria Gonorrhea Negative    Chlamydia Negative    Trichomonas Negative    Candida Urine Negative    Molecular  Comment      This specimen does not meet the strict criteria set by the FDA. The   Molecular Comment      result interpretation should be considered in conjunction with the   Molecular Comment patient's clinical history.    Comment Normal Reference Range Trichomonas - Negative    Comment Normal Reference Ranger Chlamydia - Negative    Comment      Normal Reference Range Neisseria Gonorrhea - Negative       Pertinent labs & imaging results that were available during my care of the patient were reviewed by me and considered in my medical decision making.  Assessment & Plan:  Mary [name] was seen today for migraine.  Diagnoses and all orders for this visit:  Intractable migraine without aura and without status migrainosus Down to less than 1 headache day per month with Qulipta, will continue.   Primary hypertension Repeat 132/76. Well controlled on current regimen, continue. DASH diet and exercise encouraged.   Morbid obesity (Mary [name]) Metabolic syndrome Will initiate Ozempic as prescribed. Diet and exercise encouraged, education provided. Follow up in 8 weeks for reevaluation.  -     Semaglutide,0.25 or 0.5MG/DOS, (OZEMPIC, 0.25 OR 0.5 MG/DOSE,) 2 MG/1.5ML SOPN; Inject 0.25 mg into the skin once a week for 4 doses. -     Semaglutide,0.25 or 0.5MG/DOS, (OZEMPIC, 0.25 OR 0.5 MG/DOSE,) 2 MG/1.5ML SOPN; Inject 0.5 mg into the skin once a week for 4 doses.  Abnormal EKG Has been evaluated by cardiology and is scheduled for CT coronaries and echo for further evaluation.    Continue all other maintenance medications.  Follow up plan: Return in about 8 weeks (around 08/16/2021), or if symptoms worsen or  fail to improve, for BMI.   Continue healthy lifestyle choices, including diet (rich in fruits, vegetables, and lean proteins, and low in salt and simple carbohydrates) and exercise (at least 30 minutes of moderate physical activity daily).  Educational handout given for weight loss  The above assessment and management plan was discussed with the patient. The patient verbalized understanding of and has agreed to the management plan. Patient is aware to call the clinic if they develop any new symptoms or if symptoms persist or worsen. Patient is aware when to return to the clinic for a follow-up visit. Patient educated on when it is appropriate to go to the emergency department.   Mary Pouch, FNP-C [facility] [phone]

## 2021-06-24 ENCOUNTER — Telehealth: Payer: Self-pay | Admitting: *Deleted

## 2021-06-24 MED ORDER — VICTOZA 18 MG/3ML ~~LOC~~ SOPN: PEN_INJECTOR | SUBCUTANEOUS | 0 refills | Status: DC

## 2021-06-24 NOTE — Telephone Encounter (Signed)
Medicaid will not cover Ozempic unless the following has been tried   Metformin containing med trial AND  2 of the following: Bydureon, Byetta, Trulicity, or Victoza

## 2021-06-24 NOTE — Addendum Note (Signed)
Addended by: Baruch Gouty on: 06/24/2021 04:45 PM   Modules accepted: Orders

## 2021-06-27 ENCOUNTER — Telehealth: Payer: Self-pay | Admitting: Family Medicine

## 2021-06-28 ENCOUNTER — Ambulatory Visit (HOSPITAL_COMMUNITY): Admission: RE | Admit: 2021-06-28 | Payer: Medicaid Other | Source: Ambulatory Visit

## 2021-06-28 ENCOUNTER — Other Ambulatory Visit (HOSPITAL_COMMUNITY): Payer: Self-pay | Admitting: Emergency Medicine

## 2021-06-28 DIAGNOSIS — R079 Chest pain, unspecified: Secondary | ICD-10-CM

## 2021-06-29 ENCOUNTER — Other Ambulatory Visit (INDEPENDENT_AMBULATORY_CARE_PROVIDER_SITE_OTHER): Payer: Medicaid Other

## 2021-06-29 ENCOUNTER — Encounter (HOSPITAL_COMMUNITY): Payer: Self-pay | Admitting: Emergency Medicine

## 2021-06-29 ENCOUNTER — Other Ambulatory Visit: Payer: Self-pay

## 2021-06-29 ENCOUNTER — Telehealth (HOSPITAL_COMMUNITY): Payer: Self-pay | Admitting: Emergency Medicine

## 2021-06-29 DIAGNOSIS — R079 Chest pain, unspecified: Secondary | ICD-10-CM | POA: Diagnosis not present

## 2021-06-29 NOTE — Telephone Encounter (Signed)
Sharyn Lull called from [place] to let us know that the Victoza that was sent in for pt is now requiring a PA.

## 2021-06-29 NOTE — Telephone Encounter (Signed)
Reaching out to patient to offer assistance regarding upcoming cardiac imaging study; pt verbalizes understanding of appt date/time, parking situation and where to check in, pre-test NPO status and medications ordered, and verified current allergies; name and call back number provided for further questions should they arise Marchia Bond RN Navigator Cardiac Imaging [facility] [phone] office [phone] cell  Pt reminded to get labs - states she will go to [facility] 50mg  metoprolol tartrate

## 2021-06-30 LAB — BASIC METABOLIC PANEL
BUN/Creatinine Ratio: 8 — ABNORMAL LOW (ref 9–23)
BUN: 8 mg/dL (ref 6–24)
CO2: 26 mmol/L (ref 20–29)
Calcium: 9.3 mg/dL (ref 8.7–10.2)
Chloride: 101 mmol/L (ref 96–106)
Creatinine, Ser: 1.04 mg/dL — ABNORMAL HIGH (ref 0.57–1.00)
Glucose: 76 mg/dL (ref 70–99)
Potassium: 4.4 mmol/L (ref 3.5–5.2)
Sodium: 142 mmol/L (ref 134–144)
eGFR: 69 mL/min/{1.73_m2} (ref >59)

## 2021-06-30 NOTE — Telephone Encounter (Signed)
Medicaid will not cover this medication till pt has tried and failed Metformin/Metformin containing med. I don't see where pt has been on Metformin and failed it.

## 2021-07-01 ENCOUNTER — Other Ambulatory Visit: Payer: Self-pay

## 2021-07-01 ENCOUNTER — Ambulatory Visit (HOSPITAL_COMMUNITY)
Admission: RE | Admit: 2021-07-01 | Discharge: 2021-07-01 | Disposition: A | Discharge status: Home / Self Care | Payer: Medicaid Other | Source: Ambulatory Visit | Attending: Cardiology | Admitting: Cardiology

## 2021-07-01 DIAGNOSIS — R0602 Shortness of breath: Secondary | ICD-10-CM | POA: Diagnosis not present

## 2021-07-01 DIAGNOSIS — R9431 Abnormal electrocardiogram [ECG] [EKG]: Secondary | ICD-10-CM

## 2021-07-01 DIAGNOSIS — I208 Other forms of angina pectoris: Secondary | ICD-10-CM

## 2021-07-01 MED ORDER — NITROGLYCERIN 0.4 MG SL SUBL
SUBLINGUAL_TABLET | SUBLINGUAL | Status: AC
  Filled 2021-07-01: qty 2

## 2021-07-01 MED ORDER — NITROGLYCERIN 0.4 MG SL SUBL
0.8000 mg | SUBLINGUAL_TABLET | Freq: Once | SUBLINGUAL | Status: AC
  Administered 2021-07-01: 0.8 mg via SUBLINGUAL

## 2021-07-01 MED ORDER — IOHEXOL 350 MG/ML SOLN
100.0000 mL | Freq: Once | INTRAVENOUS | Status: AC | PRN
  Administered 2021-07-01: 100 mL via INTRAVENOUS

## 2021-07-05 ENCOUNTER — Encounter: Payer: Self-pay | Admitting: *Deleted

## 2021-07-06 ENCOUNTER — Telehealth: Payer: Self-pay | Admitting: Cardiology

## 2021-07-06 NOTE — Telephone Encounter (Signed)
Attempted phone call to pt.  Left voicemail message to contact triage at [phone]. 

## 2021-07-06 NOTE — Telephone Encounter (Signed)
Patient returning call to discuss test results

## 2021-07-06 NOTE — Telephone Encounter (Signed)
PT advised her CT scan results and will wait to hear back from [place] after Echo is done 07/28/21.

## 2021-07-07 ENCOUNTER — Telehealth: Payer: Self-pay | Admitting: Family Medicine

## 2021-07-07 ENCOUNTER — Encounter: Payer: Self-pay | Admitting: *Deleted

## 2021-07-07 MED ORDER — METFORMIN HCL 500 MG PO TABS: 500.0000 mg | ORAL_TABLET | Freq: Two times a day (BID) | ORAL | 3 refills | Status: DC

## 2021-07-07 NOTE — Telephone Encounter (Signed)
Left message for patient to call us if she is interested in trying metformin

## 2021-07-07 NOTE — Telephone Encounter (Signed)
Reached out to patient.  Patient agrees to try metformin

## 2021-07-07 NOTE — Addendum Note (Signed)
Addended by: Baruch Gouty on: 07/07/2021 04:25 PM   Modules accepted: Orders

## 2021-07-07 NOTE — Telephone Encounter (Signed)
Contacted patient. She agrees to metformin. Metformin sent into pharmacy

## 2021-07-19 ENCOUNTER — Telehealth: Payer: Self-pay | Admitting: Family Medicine

## 2021-07-19 DIAGNOSIS — M722 Plantar fascial fibromatosis: Secondary | ICD-10-CM | POA: Diagnosis not present

## 2021-07-19 NOTE — Telephone Encounter (Signed)
Spoke to patient. She states that she has bilateral kidney pain described as dull/sharp. She claims that she has never felt this prior to taking metformin. She says she has been hydrating as previously advised. She believes the Metformin is cause.

## 2021-07-19 NOTE — Telephone Encounter (Signed)
Informed patient that she needs to give a urine sample and some blood work.  Patient agrees

## 2021-07-19 NOTE — Telephone Encounter (Signed)
Pt is having issues with metformin and would like to speak to a nurse about it. Please call back and advise.

## 2021-07-21 DIAGNOSIS — Z419 Encounter for procedure for purposes other than remedying health state, unspecified: Secondary | ICD-10-CM | POA: Diagnosis not present

## 2021-07-21 NOTE — Telephone Encounter (Signed)
Please see note from 11/4

## 2021-07-25 ENCOUNTER — Ambulatory Visit
Admission: EM | Admit: 2021-07-25 | Discharge: 2021-07-25 | Disposition: A | Discharge status: Home / Self Care | Payer: Medicaid Other | Attending: Physician Assistant | Admitting: Physician Assistant

## 2021-07-25 ENCOUNTER — Other Ambulatory Visit: Payer: Self-pay

## 2021-07-25 ENCOUNTER — Encounter: Payer: Self-pay | Admitting: Emergency Medicine

## 2021-07-25 DIAGNOSIS — B029 Zoster without complications: Secondary | ICD-10-CM | POA: Diagnosis not present

## 2021-07-25 HISTORY — DX: Plantar fascial fibromatosis: M72.2

## 2021-07-25 MED ORDER — ACYCLOVIR 800 MG PO TABS: 800.0000 mg | ORAL_TABLET | Freq: Every day | ORAL | 0 refills | Status: DC

## 2021-07-25 NOTE — ED Provider Notes (Signed)
[facility]    CSN: [id number] Arrival date & time: 07/25/21  1741      History   Chief Complaint Chief Complaint  Patient presents with   Medication Reaction    HPI GEMINI BUNTE is a 40 y.o. female.   Pt complains of blisters to the right side of her mouth and face.  Pt concerned that she is having an allergic reaction to metformin.  Pt reports allergies to multiple mediations.  Pt denies history of oral ulcers   The history is provided by the patient. No language interpreter was used.   Past Medical History:  Diagnosis Date   Allergy    Anxiety    Asthma    Bipolar 1 disorder (HCC)    Bulging lumbar disc    BV (bacterial vaginosis) 02/18/2018   Cancer of cervix (HCC)    Carpal tunnel syndrome on right    Cervical cancer (HCC)    Chronic back pain    COPD (chronic obstructive pulmonary disease) (HCC)    Depression    Hypertension    Migraines    Physiological ovarian cysts    Plantar fasciitis    PTSD (post-traumatic stress disorder)    Rheumatoid arthritis (HCC)    S/P partial hysterectomy    Vaginal Pap smear, abnormal     Patient Active Problem List   Diagnosis Date Noted   Metabolic syndrome 70/62/3762   Chest pain of uncertain etiology 83/15/1761   Chronic pain syndrome 05/24/2021   Abnormal EKG 05/24/2021   High risk medication use 05/24/2021   Allergic rhinitis 05/24/2021   Morbid obesity ([place]) 06/11/2020   Primary hypertension 06/11/2020   Migraine headache without aura 12/02/2019   History of cervical cancer 02/06/2018   Depression, recurrent ([place]) 02/06/2018   Right ovarian cyst 03/19/2014   Bilateral sciatica 03/17/2014   Chronic low back pain 02/03/2014   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   COPD (chronic obstructive pulmonary disease) ([place]) 02/03/2014   GAD (generalized anxiety disorder) 02/03/2014   PTSD (post-traumatic stress disorder) 02/03/2014   Dyspnea on exertion 11/26/2009    Past Surgical History:  Procedure  Laterality Date   ABDOMINAL HYSTERECTOMY     ABDOMINAL HYSTERECTOMY     EYE SURGERY  age 39   Artificial right eye   ORIF ELBOW FRACTURE Left 11/18/2019   Procedure: OPEN REDUCTION WITH REPAIR VERSUS FRAGMENT EXCISION CAPITELLUM FRACTURE LEFT ELBOW AND LIGAMENTOUS RECONSTRUCTION AS NECESSARY;  Surgeon: Roseanne Kaufman, MD;  Location: [facility];  Service: Orthopedics;  Laterality: Left;  2 HRS    OB History     Gravida  3   Para  3   Term  3   Preterm      AB      Living  3      SAB      IAB      Ectopic      Multiple      Live Births               Home Medications    Prior to Admission medications   Medication Sig Start Date End Date Taking? Authorizing Provider  acyclovir (ZOVIRAX) 800 MG tablet Take 1 tablet (800 mg total) by mouth 5 (five) times daily. 07/25/21  Yes Caryl Ada K, PA-C  albuterol (VENTOLIN HFA) 108 (90 Base) MCG/ACT inhaler Inhale 2 puffs into the lungs every 6 (six) hours as needed for wheezing or shortness of breath. 05/24/21   Baruch Gouty, FNP  Atogepant (QULIPTA) 10 MG TABS Take 10 mg by mouth daily. 05/26/21   Baruch Gouty, FNP  cholecalciferol (VITAMIN D3) 25 MCG (1000 UNIT) tablet Take 1,000 Units by mouth daily.    [provider]  fluticasone (FLONASE) 50 MCG/ACT nasal spray Place 1 spray into both nostrils daily. 01/15/21   Rancour, Annie Main, MD  furosemide (LASIX) 40 MG tablet Take 1 tablet (40 mg total) by mouth daily. 05/24/21 06/23/21  Baruch Gouty, FNP  liraglutide (VICTOZA) 18 MG/3ML SOPN Start 0.6mg  SQ once a day for 7 days, then increase to 1.2mg  once a day 06/24/21   Baruch Gouty, FNP  losartan (COZAAR) 50 MG tablet Take 1 tablet (50 mg total) by mouth daily. 05/24/21   Baruch Gouty, FNP  metFORMIN (GLUCOPHAGE) 500 MG tablet Take 1 tablet (500 mg total) by mouth 2 (two) times daily with a meal. 07/07/21   Rakes, Connye Burkitt, FNP  metoprolol tartrate (LOPRESSOR) 50 MG tablet Take 1 tablet (50 mg total) by mouth as  directed. Take (1) tablet 2 hours before your CT scan 06/20/21   Jerline Pain, MD  mometasone-formoterol (DULERA) 100-5 MCG/ACT AERO Inhale 2 puffs into the lungs 2 (two) times daily. 05/24/21   Rakes, Connye Burkitt, FNP    Family History Family History  Problem Relation Age of Onset   Stroke Mother        TIA   Asthma Mother    Cancer Mother    Other Mother        blood platelets are dropping   Heart attack Father    Hypertension Father    Cancer Father        skin cancer   Stroke Father    Asthma Daughter    Sleep apnea Son    Diabetes Maternal Grandmother    Heart Problems Maternal Grandmother    Anuerysm Maternal Grandfather    Seizures Maternal Grandfather    Cancer Paternal Grandmother        breast   Fibromyalgia Sister    Cancer Sister        cervical   Cancer Sister        cervical   Cancer Brother        thyroid cancer   Cancer Maternal Aunt        breast cancer   Cancer Paternal Aunt     Social History Social History   Tobacco Use   Smoking status: Never   Smokeless tobacco: Never  Vaping Use   Vaping Use: Never used  Substance Use Topics   Alcohol use: No   Drug use: No     Allergies   Buprenorphine hcl, Morphine, Morphine and related, Penicillins, Gabapentin, Nsaids, Ibuprofen & acetaminophen, Latex, and Tylenol [acetaminophen]   Review of Systems Review of Systems  Skin:  Positive for rash.  All other systems reviewed and are negative.   Physical Exam Triage Vital Signs ED Triage Vitals  Enc Vitals Group     BP 07/25/21 1813 (!) 146/97     Pulse Rate 07/25/21 1813 63     Resp 07/25/21 1813 18     Temp 07/25/21 1813 99.4 F (37.4 C)     Temp Source 07/25/21 1813 Oral     SpO2 07/25/21 1813 99 %     Weight 07/25/21 1811 267 lb (121.1 kg)     Height 07/25/21 1811 5\' 9"  (1.753 m)     Head Circumference --      Peak  Flow --      Pain Score 07/25/21 1811 7     Pain Loc --      Pain Edu? --      Excl. in [place]? --    No data  found.  Updated Vital Signs BP (!) 146/97 (BP Location: Right Arm)   Pulse 63   Temp 99.4 F (37.4 C) (Oral)   Resp 18   Ht 5\' 9"  (1.753 m)   Wt 121.1 kg   SpO2 99%   BMI 39.43 kg/m   Visual Acuity Right Eye Distance:   Left Eye Distance:   Bilateral Distance:    Right Eye Near:   Left Eye Near:    Bilateral Near:     Physical Exam Vitals and nursing note reviewed.  Constitutional:      Appearance: She is well-developed.  HENT:     Head: Normocephalic.     Right Ear: External ear normal.     Left Ear: External ear normal.     Mouth/Throat:     Mouth: Mucous membranes are moist.  Cardiovascular:     Rate and Rhythm: Normal rate.  Pulmonary:     Effort: Pulmonary effort is normal.  Abdominal:     General: There is no distension.  Musculoskeletal:        General: Normal range of motion.     Cervical back: Normal range of motion.  Skin:    General: Skin is warm.     Comments: Multiple blisters corner of right upper lip and face,  small blistered area right side of face,   Neurological:     Mental Status: She is alert and oriented to person, place, and time.  Psychiatric:        Mood and Affect: Mood normal.     UC Treatments / Results  Labs (all labs ordered are listed, but only abnormal results are displayed) Labs Reviewed - No data to display  EKG   Radiology No results found.  Procedures Procedures (including critical care time)  Medications Ordered in UC Medications - No data to display  Initial Impression / Assessment and Plan / UC Course  I have reviewed the triage vital signs and the nursing notes.  Pertinent labs & imaging results that were available during my care of the patient were reviewed by me and considered in my medical decision making (see chart for details).     MDM:  Rash looks herpetic,  Pt counseled on probability of zoster. No otic involvement  Final Clinical Impressions(s) / UC Diagnoses   Final diagnoses:  Herpes  zoster without complication   Discharge Instructions   None    ED Prescriptions     Medication Sig Dispense Auth. Provider   acyclovir (ZOVIRAX) 800 MG tablet Take 1 tablet (800 mg total) by mouth 5 (five) times daily. 50 tablet Fransico Meadow, [place]      PDMP not reviewed this encounter.   Fransico Meadow, [place] 07/26/21 1733

## 2021-07-25 NOTE — ED Triage Notes (Addendum)
Pt reports was started on metformin and reports kidney levels "giving me problems" and blisters in/around mouth that has made it hard to eat or drink for last several days. Pt reports took last dose x3 days ago. Pt also reports bilateral eye watering and nasal drainage.

## 2021-07-26 ENCOUNTER — Other Ambulatory Visit: Payer: Medicaid Other

## 2021-07-26 ENCOUNTER — Other Ambulatory Visit: Payer: Self-pay | Admitting: *Deleted

## 2021-07-26 ENCOUNTER — Ambulatory Visit (INDEPENDENT_AMBULATORY_CARE_PROVIDER_SITE_OTHER): Payer: Medicaid Other | Admitting: Nurse Practitioner

## 2021-07-26 ENCOUNTER — Encounter: Payer: Self-pay | Admitting: Nurse Practitioner

## 2021-07-26 DIAGNOSIS — I10 Essential (primary) hypertension: Secondary | ICD-10-CM

## 2021-07-26 DIAGNOSIS — B009 Herpesviral infection, unspecified: Secondary | ICD-10-CM

## 2021-07-26 LAB — URINALYSIS, ROUTINE W REFLEX MICROSCOPIC
Bilirubin, UA: NEGATIVE
Glucose, UA: NEGATIVE
Ketones, UA: NEGATIVE
Leukocytes,UA: NEGATIVE
Nitrite, UA: NEGATIVE
Protein,UA: NEGATIVE
RBC, UA: NEGATIVE
Specific Gravity, UA: 1.02 (ref 1.005–1.030)
Urobilinogen, Ur: 1 mg/dL (ref 0.2–1.0)
pH, UA: 7 (ref 5.0–7.5)

## 2021-07-26 LAB — CMP14+EGFR
ALT: 24 IU/L (ref 0–32)
AST: 19 IU/L (ref 0–40)
Albumin/Globulin Ratio: 1.8 (ref 1.2–2.2)
Albumin: 4.3 g/dL (ref 3.8–4.8)
Alkaline Phosphatase: 107 IU/L (ref 44–121)
BUN/Creatinine Ratio: 13 (ref 9–23)
BUN: 12 mg/dL (ref 6–24)
Bilirubin Total: 0.3 mg/dL (ref 0.0–1.2)
CO2: 24 mmol/L (ref 20–29)
Calcium: 9 mg/dL (ref 8.7–10.2)
Chloride: 103 mmol/L (ref 96–106)
Creatinine, Ser: 0.94 mg/dL (ref 0.57–1.00)
Globulin, Total: 2.4 g/dL (ref 1.5–4.5)
Glucose: 89 mg/dL (ref 70–99)
Potassium: 4 mmol/L (ref 3.5–5.2)
Sodium: 141 mmol/L (ref 134–144)
Total Protein: 6.7 g/dL (ref 6.0–8.5)
eGFR: 78 mL/min/{1.73_m2} (ref >59)

## 2021-07-26 NOTE — Progress Notes (Signed)
   Virtual Visit  Note Due to COVID-19 pandemic this visit was conducted virtually. This visit type was conducted due to national recommendations for restrictions regarding the COVID-19 Pandemic (e.g. social distancing, sheltering in place) in an effort to limit this patient's exposure and mitigate transmission in our community. All issues noted in this document were discussed and addressed.  A physical exam was not performed with this format.  I connected with Mary [name] on 07/26/21 at 1:47 by telephone and verified that I am speaking with the correct person using two identifiers. Mary [name] is currently located at home and no one is currently with her during visit. The provider, Mary-Margaret Hassell Done, FNP is located in their office at time of visit.  I discussed the limitations, risks, security and privacy concerns of performing an evaluation and management service by telephone and the availability of in person appointments. I also discussed with the patient that there may be a patient responsible charge related to this service. The patient expressed understanding and agreed to proceed.   History and Present Illness:  Patient had to go to urgent care last night because her right upper lip has blisters on it. On her discharge paper sit says she has herpes and that scared her. She was given acyclovir. SCARED.    Review of Systems  Constitutional:  Negative for chills and fever.  Respiratory: Negative.    Cardiovascular: Negative.   Genitourinary: Negative.   Musculoskeletal: Negative.   Neurological: Negative.   Psychiatric/Behavioral: Negative.      Observations/Objective: Alert and oriented- answers all questions appropriately No distress   Assessment and Plan: [name] in today with chief complaint of blisters around mouth  1. Herpes simplex type 1 antibody positive Take acyclovir as prescribed Do not pick or scratch at lesions RTO prn   Follow Up  Instructions: prn    I discussed the assessment and treatment plan with the patient. The patient was provided an opportunity to ask questions and all were answered. The patient agreed with the plan and demonstrated an understanding of the instructions.   The patient was advised to call back or seek an in-person evaluation if the symptoms worsen or if the condition fails to improve as anticipated.  The above assessment and management plan was discussed with the patient. The patient verbalized understanding of and has agreed to the management plan. Patient is aware to call the clinic if symptoms persist or worsen. Patient is aware when to return to the clinic for a follow-up visit. Patient educated on when it is appropriate to go to the emergency department.   Time call ended:  1:58  I provided 11 minutes of  non face-to-face time during this encounter.    Mary-Margaret Hassell Done, FNP

## 2021-07-27 LAB — MICROALBUMIN / CREATININE URINE RATIO
Creatinine, Urine: 109.4 mg/dL
Microalb/Creat Ratio: 3 mg/g creat (ref 0–29)
Microalbumin, Urine: 3.1 ug/mL

## 2021-07-28 ENCOUNTER — Other Ambulatory Visit: Payer: Self-pay | Admitting: Family Medicine

## 2021-07-28 ENCOUNTER — Other Ambulatory Visit: Payer: Self-pay

## 2021-07-28 ENCOUNTER — Ambulatory Visit (HOSPITAL_COMMUNITY)
Admission: RE | Admit: 2021-07-28 | Discharge: 2021-07-28 | Disposition: A | Discharge status: Home / Self Care | Payer: Medicaid Other | Source: Ambulatory Visit | Attending: Cardiology | Admitting: Cardiology

## 2021-07-28 DIAGNOSIS — R9431 Abnormal electrocardiogram [ECG] [EKG]: Secondary | ICD-10-CM | POA: Diagnosis not present

## 2021-07-28 DIAGNOSIS — J410 Simple chronic bronchitis: Secondary | ICD-10-CM

## 2021-07-28 LAB — ECHOCARDIOGRAM COMPLETE
AR max vel: 1.85 cm2
AV Area VTI: 1.89 cm2
AV Area mean vel: 2.01 cm2
AV Mean grad: 8 mmHg
AV Peak grad: 15.1 mmHg
Ao pk vel: 1.94 m/s
Area-P 1/2: 2.73 cm2
S' Lateral: 3.7 cm

## 2021-07-28 NOTE — Progress Notes (Signed)
*  PRELIMINARY RESULTS* Echocardiogram 2D Echocardiogram has been performed.  Mary [name] 07/28/2021, 12:11 PM

## 2021-08-01 ENCOUNTER — Telehealth: Payer: Self-pay | Admitting: Family Medicine

## 2021-08-02 ENCOUNTER — Ambulatory Visit: Payer: Medicaid Other | Admitting: Family Medicine

## 2021-08-03 ENCOUNTER — Encounter: Payer: Self-pay | Admitting: Family Medicine

## 2021-08-04 DIAGNOSIS — F331 Major depressive disorder, recurrent, moderate: Secondary | ICD-10-CM | POA: Diagnosis not present

## 2021-08-10 DIAGNOSIS — H5213 Myopia, bilateral: Secondary | ICD-10-CM | POA: Diagnosis not present

## 2021-08-10 DIAGNOSIS — F329 Major depressive disorder, single episode, unspecified: Secondary | ICD-10-CM | POA: Diagnosis not present

## 2021-08-16 ENCOUNTER — Ambulatory Visit (INDEPENDENT_AMBULATORY_CARE_PROVIDER_SITE_OTHER): Payer: Medicaid Other | Admitting: Family Medicine

## 2021-08-16 ENCOUNTER — Encounter: Payer: Self-pay | Admitting: Family Medicine

## 2021-08-16 DIAGNOSIS — I10 Essential (primary) hypertension: Secondary | ICD-10-CM

## 2021-08-16 DIAGNOSIS — E8881 Metabolic syndrome: Secondary | ICD-10-CM | POA: Diagnosis not present

## 2021-08-16 DIAGNOSIS — R110 Nausea: Secondary | ICD-10-CM | POA: Diagnosis not present

## 2021-08-16 DIAGNOSIS — J301 Allergic rhinitis due to pollen: Secondary | ICD-10-CM

## 2021-08-16 LAB — BMP8+EGFR
BUN/Creatinine Ratio: 10 (ref 9–23)
BUN: 11 mg/dL (ref 6–24)
CO2: 26 mmol/L (ref 20–29)
Calcium: 9 mg/dL (ref 8.7–10.2)
Chloride: 104 mmol/L (ref 96–106)
Creatinine, Ser: 1.06 mg/dL — ABNORMAL HIGH (ref 0.57–1.00)
Glucose: 96 mg/dL (ref 70–99)
Potassium: 4.2 mmol/L (ref 3.5–5.2)
Sodium: 141 mmol/L (ref 134–144)
eGFR: 67 mL/min/{1.73_m2} (ref >59)

## 2021-08-16 MED ORDER — ONDANSETRON 4 MG PO TBDP: 4.0000 mg | ORAL_TABLET | Freq: Three times a day (TID) | ORAL | 0 refills | Status: DC | PRN

## 2021-08-16 MED ORDER — FLUTICASONE PROPIONATE 50 MCG/ACT NA SUSP: 1.0000 | Freq: Every day | NASAL | 0 refills | Status: DC

## 2021-08-16 NOTE — Progress Notes (Signed)
Subjective:  Patient ID: Mary [name], female    DOB: [birth year], 40 y.o.   MRN: [id number]  Patient Care Team: Baruch Gouty, FNP as PCP - General (Family Medicine)   Chief Complaint:  Medical Management of Chronic Issues   HPI: Mary [name] is a 40 y.o. female presenting on 08/16/2021 for Medical Management of Chronic Issues   Patient presents today for follow-up of morbid obesity, metabolic syndrome, and hypertension.  Initially try to get Ozempic approved for metabolic syndrome and morbid obesity.  This was declined by insurance so metformin was started.  Upon initially starting metformin, patient has some complaints of flank pain and was concerned about kidney function.  Labs and urinalysis were unremarkable.  Patient has since restarted metformin and is tolerating well.  States at times it will cause some slight nausea.  She has not taken her blood pressure medications this morning as she was in a rush to get to appointment.  She states blood pressure is well controlled at home.  No headaches, visual changes, focal neurological deficits, or leg swelling.  She states she has changed her diet and is trying to be more active.  She is limiting her sodium intake.  She is unable to exercise as much as she would like due to arthralgias.  She reports she is out of her Flonase refill today as she has chronic seasonal allergic rhinitis.  States since being out of her medications her nose has been running frequently.  No other associated symptoms.    Relevant past medical, surgical, family, and social history reviewed and updated as indicated.  Allergies and medications reviewed and updated. Data reviewed: Chart in Epic.   Past Medical History:  Diagnosis Date   Allergy    Anxiety    Asthma    Bipolar 1 disorder (HCC)    Bulging lumbar disc    BV (bacterial vaginosis) 02/18/2018   Cancer of cervix (HCC)    Carpal tunnel syndrome on right    Cervical cancer (HCC)     Chronic back pain    COPD (chronic obstructive pulmonary disease) (HCC)    Depression    Hypertension    Migraines    Physiological ovarian cysts    Plantar fasciitis    PTSD (post-traumatic stress disorder)    Rheumatoid arthritis (HCC)    S/P partial hysterectomy    Vaginal Pap smear, abnormal     Past Surgical History:  Procedure Laterality Date   ABDOMINAL HYSTERECTOMY     ABDOMINAL HYSTERECTOMY     EYE SURGERY  age 63   Artificial right eye   ORIF ELBOW FRACTURE Left 11/18/2019   Procedure: OPEN REDUCTION WITH REPAIR VERSUS FRAGMENT EXCISION CAPITELLUM FRACTURE LEFT ELBOW AND LIGAMENTOUS RECONSTRUCTION AS NECESSARY;  Surgeon: Roseanne Kaufman, MD;  Location: [facility];  Service: Orthopedics;  Laterality: Left;  2 HRS    Social History   Socioeconomic History   Marital status: Divorced    Spouse name: Not on file   Number of children: 3   Years of education: 12   Highest education level: High school graduate  Occupational History   Occupation: Seeking disability  Tobacco Use   Smoking status: Never   Smokeless tobacco: Never  Vaping Use   Vaping Use: Never used  Substance and Sexual Activity   Alcohol use: No   Drug use: No   Sexual activity: Yes    Birth control/protection: Surgical    Comment: hyst  Other Topics  Concern   Not on file  Social History Narrative   Lives at home with father.   Right-handed.   No daily caffeine per day.   Social Determinants of Health   Financial Resource Strain: Not on file  Food Insecurity: Not on file  Transportation Needs: Not on file  Physical Activity: Not on file  Stress: Not on file  Social Connections: Not on file  Intimate Partner Violence: Not on file    Outpatient Encounter Medications as of 08/16/2021  Medication Sig   acyclovir (ZOVIRAX) 800 MG tablet Take 1 tablet (800 mg total) by mouth 5 (five) times daily.   albuterol (VENTOLIN HFA) 108 (90 Base) MCG/ACT inhaler Inhale 2 puffs into the lungs every 6 (six)  hours as needed for wheezing or shortness of breath.   Atogepant (QULIPTA) 10 MG TABS Take 10 mg by mouth daily.   cholecalciferol (VITAMIN D3) 25 MCG (1000 UNIT) tablet Take 1,000 Units by mouth daily.   citalopram (CELEXA) 20 MG tablet Take by mouth.   DULERA 100-5 MCG/ACT AERO INHALE 2 PUFFS TWICE DAILY   liraglutide (VICTOZA) 18 MG/3ML SOPN Start 0.12m SQ once a day for 7 days, then increase to 1.230monce a day   losartan (COZAAR) 50 MG tablet Take 1 tablet (50 mg total) by mouth daily.   metFORMIN (GLUCOPHAGE) 500 MG tablet Take 1 tablet (500 mg total) by mouth 2 (two) times daily with a meal.   metoprolol tartrate (LOPRESSOR) 50 MG tablet Take 1 tablet (50 mg total) by mouth as directed. Take (1) tablet 2 hours before your CT scan   ondansetron (ZOFRAN-ODT) 4 MG disintegrating tablet Take 1 tablet (4 mg total) by mouth every 8 (eight) hours as needed for nausea or vomiting.   traZODone (DESYREL) 100 MG tablet Take by mouth.   [DISCONTINUED] fluticasone (FLONASE) 50 MCG/ACT nasal spray Place 1 spray into both nostrils daily.   fluticasone (FLONASE) 50 MCG/ACT nasal spray Place 1 spray into both nostrils daily.   furosemide (LASIX) 40 MG tablet Take 1 tablet (40 mg total) by mouth daily.   [DISCONTINUED] ibuprofen (ADVIL) 800 MG tablet Take 800 mg by mouth 2 (two) times daily as needed.   No facility-administered encounter medications on file as of 08/16/2021.    Allergies  Allergen Reactions   Buprenorphine Hcl Anaphylaxis    "it stops my heart"   Morphine Anaphylaxis    i quit breathing    Morphine And Related Anaphylaxis    "it stops my heart"   Penicillins Anaphylaxis and Other (See Comments)    Has patient had a PCN reaction causing immediate rash, facial/tongue/throat swelling, SOB or lightheadedness with hypotension: Yes Has patient had a PCN reaction causing severe rash involving mucus membranes or skin necrosis: Yes Has patient had a PCN reaction that required  hospitalization: Yes Has patient had a PCN reaction occurring within the last 10 years: No If all of the above answers are "NO", then may proceed with Cephalosporin use.    Gabapentin Other (See Comments)    Per patient "hallucination"   Nsaids    Ibuprofen & Acetaminophen Nausea And Vomiting   Latex Rash   Tylenol [Acetaminophen] Nausea And Vomiting    Review of Systems  Constitutional:  Negative for activity change, appetite change, chills, diaphoresis, fatigue, fever and unexpected weight change.  HENT:  Positive for postnasal drip and rhinorrhea.   Eyes: Negative.  Negative for photophobia and visual disturbance.  Respiratory:  Negative for cough, chest tightness  and shortness of breath.   Cardiovascular:  Negative for palpitations and leg swelling.  Gastrointestinal:  Positive for nausea. Negative for abdominal pain, blood in stool, constipation, diarrhea and vomiting.  Endocrine: Negative.   Genitourinary:  Negative for decreased urine volume, difficulty urinating, dysuria, frequency and urgency.  Musculoskeletal:  Negative for arthralgias and myalgias.  Skin: Negative.   Allergic/Immunologic: Negative.   Neurological:  Negative for syncope, facial asymmetry, light-headedness and numbness.  Hematological: Negative.   Psychiatric/Behavioral:  Negative for hallucinations, sleep disturbance and suicidal ideas.   All other systems reviewed and are negative.      Objective:  BP (!) 142/88 Comment: has not taken BP medications this morning   Pulse 61    Temp 97.7 F (36.5 C)    Ht 5' 9"  (1.753 m)    Wt (!) 335 lb (152 kg)    SpO2 95%    BMI 49.47 kg/m    Wt Readings from Last 3 Encounters:  08/16/21 (!) 335 lb (152 kg)  07/25/21 267 lb (121.1 kg)  06/21/21 (!) 331 lb (150.1 kg)    Physical Exam Vitals and nursing note reviewed.  Constitutional:      General: She is not in acute distress.    Appearance: Normal appearance. She is not ill-appearing, toxic-appearing or  diaphoretic.  HENT:     Head: Normocephalic and atraumatic.     Mouth/Throat:     Mouth: Mucous membranes are moist.  Eyes:     Comments: Right eye prosthesis  Cardiovascular:     Rate and Rhythm: Normal rate and regular rhythm.     Heart sounds: Murmur heard.  Systolic murmur is present with a grade of 2/6.  Pulmonary:     Effort: Pulmonary effort is normal.     Breath sounds: Normal breath sounds.  Musculoskeletal:     Right lower leg: No edema.     Left lower leg: No edema.  Skin:    General: Skin is warm and dry.     Capillary Refill: Capillary refill takes less than 2 seconds.  Neurological:     General: No focal deficit present.     Mental Status: She is alert and oriented to person, place, and time.  Psychiatric:        Mood and Affect: Mood normal.        Behavior: Behavior normal.        Thought Content: Thought content normal.        Judgment: Judgment normal.    Results for orders placed or performed during the hospital encounter of 07/28/21  ECHOCARDIOGRAM COMPLETE  Result Value Ref Range   AR max vel 1.85 cm2   AV Area VTI 1.89 cm2   AV Mean grad 8.0 mmHg   AV Peak grad 15.1 mmHg   Ao pk vel 1.94 m/s   AV Area mean vel 2.01 cm2   Area-P 1/2 2.73 cm2   S' Lateral 3.70 cm       Pertinent labs & imaging results that were available during my care of the patient were reviewed by me and considered in my medical decision making.  Assessment & Plan:  Kalany was seen today for medical management of chronic issues.  Diagnoses and all orders for this visit:  Morbid obesity ([place]) Metabolic syndrome Diet and exercise encouraged.  Continue metformin.  Discussed potential referral to bariatric surgery.  Patient wishes to try diet, exercise, and medications prior to referral. -     BMP8+EGFR  Primary  hypertension Has been taking blood pressure medications this morning and reports good control at home.  Patient aware to take medications when she gets home.  We  will recheck renal function today. -     BMP8+EGFR  Seasonal allergic rhinitis due to pollen Well-controlled on Flonase, will continue. -     fluticasone (FLONASE) 50 MCG/ACT nasal spray; Place 1 spray into both nostrils daily.  Nausea Some nausea with metformin use.  Will prescribe Zofran as needed. -     ondansetron (ZOFRAN-ODT) 4 MG disintegrating tablet; Take 1 tablet (4 mg total) by mouth every 8 (eight) hours as needed for nausea or vomiting.     Continue all other maintenance medications.  Follow up plan: Return in about 3 months (around 11/14/2021), or if symptoms worsen or fail to improve, for BMI.   Continue healthy lifestyle choices, including diet (rich in fruits, vegetables, and lean proteins, and low in salt and simple carbohydrates) and exercise (at least 30 minutes of moderate physical activity daily).  Educational handout given for calorie counting for weight loss   The above assessment and management plan was discussed with the patient. The patient verbalized understanding of and has agreed to the management plan. Patient is aware to call the clinic if they develop any new symptoms or if symptoms persist or worsen. Patient is aware when to return to the clinic for a follow-up visit. Patient educated on when it is appropriate to go to the emergency department.   Monia Pouch, FNP-C [facility] [phone]

## 2021-08-21 DIAGNOSIS — Z419 Encounter for procedure for purposes other than remedying health state, unspecified: Secondary | ICD-10-CM | POA: Diagnosis not present

## 2021-09-21 ENCOUNTER — Encounter: Payer: Self-pay | Admitting: Family Medicine

## 2021-09-21 ENCOUNTER — Ambulatory Visit: Payer: Medicaid Other | Admitting: Family Medicine

## 2021-09-21 DIAGNOSIS — Z419 Encounter for procedure for purposes other than remedying health state, unspecified: Secondary | ICD-10-CM | POA: Diagnosis not present

## 2021-09-29 ENCOUNTER — Ambulatory Visit: Payer: Medicaid Other | Admitting: Family Medicine

## 2021-09-30 ENCOUNTER — Ambulatory Visit: Payer: Medicaid Other | Admitting: Family Medicine

## 2021-10-03 ENCOUNTER — Encounter: Payer: Self-pay | Admitting: Family Medicine

## 2021-10-17 ENCOUNTER — Telehealth: Payer: Self-pay | Admitting: Family Medicine

## 2021-10-17 NOTE — Telephone Encounter (Signed)
Pt called stating that she needs PCP to go online and fill out forms that state that she needs to be on medicaid direct instead of the [employer] medicaid she is currently on, because she says right she is having to pay fees for the [facility] plan she has and cant afford the copays for all the doctors she sees and meds she has to take.  Please advise and call patient.

## 2021-10-17 NOTE — Telephone Encounter (Signed)
Called and spoke with patient to inform we did not do this and she states her DSS person told her that this had to be completed by her pcp. Then we got disconnected tried to call back and got NA

## 2021-10-19 DIAGNOSIS — Z419 Encounter for procedure for purposes other than remedying health state, unspecified: Secondary | ICD-10-CM | POA: Diagnosis not present

## 2021-11-07 ENCOUNTER — Encounter: Payer: Self-pay | Admitting: Adult Health

## 2021-11-07 ENCOUNTER — Other Ambulatory Visit: Payer: Self-pay

## 2021-11-07 ENCOUNTER — Ambulatory Visit: Payer: Medicaid Other | Admitting: Adult Health

## 2021-11-07 DIAGNOSIS — M25472 Effusion, left ankle: Secondary | ICD-10-CM

## 2021-11-07 DIAGNOSIS — M25471 Effusion, right ankle: Secondary | ICD-10-CM | POA: Insufficient documentation

## 2021-11-07 NOTE — Progress Notes (Signed)
?  Subjective:  ?  ? Patient ID: Mary [name], female   DOB: 09/29/1980, 41 y.o.   MRN: [id number] ? ?HPI ?Mary [name] is a 41 year old white female, divorced, sp hysterectomy for cervical cancer in complaining of not being able to lose weight and swelling in lower legs. ?She  was on metformin but stopped, and insurance would not cover victoza. ? ?Lab Results  ?Component Value Date  ? DIAGPAP  06/11/2020  ?  - Negative for intraepithelial lesion or malignancy (NILM)  ? HPV NOT DETECTED 02/06/2018  ? [place] Negative 06/11/2020  ? PCP is Mary Lesches NP ? ?Review of Systems ?Can;t lose weight ?+swelling in lower legs, is on lasix ?Reviewed past medical,surgical, social and family history. Reviewed medications and allergies.  ?   ?Objective:  ? Physical Exam ?BP (!) 159/89 (BP Location: Right Arm, Patient Position: Sitting, Cuff Size: Normal)   Pulse 62   Ht 5' 7.25" (1.708 m)   Wt (!) 348 lb (157.9 kg)   BMI 54.10 kg/m?  ?Skin warm and dry.  Lungs: clear to ausculation bilaterally. Cardiovascular: regular rate and rhythm.Marland Kitchen ?Has some swelling BLE, non pitting ?   ? [place] - 11/07/21 1142   ? ?  ? Pregnancy Intention Screening  ? Does the patient want to become pregnant in the next year? N/A   ? Does the patient's partner want to become pregnant in the next year? N/A   ? Would the patient like to discuss contraceptive options today? N/A   ?  ? Contraception Wrap Up  ? Current Method --   hyst  ? End Method --   hyst  ? Contraception Counseling Provided No   ? ?  ?  ? ?  ?  ?Assessment:  ?   ?1. Morbid obesity ([place]) ?Bariatric surgery may be her best option ?[facility], and they told me to give her number for Bariat[facility] Sem[facility] [phone] and register for that, once been to that [facility] will call her ? ?2. Swelling of both ankles ?Taking Lasix from PCP ?Follow up with PCP ?   ?Plan:  ?   ?Return in about 3 weeks for pap and physical with me  ?   ?

## 2021-11-08 DIAGNOSIS — E119 Type 2 diabetes mellitus without complications: Secondary | ICD-10-CM | POA: Diagnosis not present

## 2021-11-08 DIAGNOSIS — I10 Essential (primary) hypertension: Secondary | ICD-10-CM | POA: Diagnosis not present

## 2021-11-08 DIAGNOSIS — N951 Menopausal and female climacteric states: Secondary | ICD-10-CM | POA: Diagnosis not present

## 2021-11-08 DIAGNOSIS — R635 Abnormal weight gain: Secondary | ICD-10-CM | POA: Diagnosis not present

## 2021-11-08 DIAGNOSIS — Z6841 Body Mass Index (BMI) 40.0 and over, adult: Secondary | ICD-10-CM | POA: Diagnosis not present

## 2021-11-08 DIAGNOSIS — E559 Vitamin D deficiency, unspecified: Secondary | ICD-10-CM | POA: Diagnosis not present

## 2021-11-14 ENCOUNTER — Ambulatory Visit
Admission: EM | Admit: 2021-11-14 | Discharge: 2021-11-14 | Disposition: A | Discharge status: Home / Self Care | Payer: Medicaid Other

## 2021-11-14 ENCOUNTER — Other Ambulatory Visit: Payer: Self-pay

## 2021-11-15 ENCOUNTER — Encounter: Payer: Self-pay | Admitting: Family Medicine

## 2021-11-15 ENCOUNTER — Ambulatory Visit (INDEPENDENT_AMBULATORY_CARE_PROVIDER_SITE_OTHER): Payer: Medicaid Other | Admitting: Family Medicine

## 2021-11-15 ENCOUNTER — Telehealth: Payer: Self-pay | Admitting: Family Medicine

## 2021-11-15 VITALS — BP 127/82 | HR 81 | Temp 98.0°F | Ht 67.25 in | Wt 337.6 lb

## 2021-11-15 DIAGNOSIS — I10 Essential (primary) hypertension: Secondary | ICD-10-CM

## 2021-11-15 DIAGNOSIS — R9431 Abnormal electrocardiogram [ECG] [EKG]: Secondary | ICD-10-CM

## 2021-11-15 DIAGNOSIS — E8881 Metabolic syndrome: Secondary | ICD-10-CM

## 2021-11-15 DIAGNOSIS — J301 Allergic rhinitis due to pollen: Secondary | ICD-10-CM

## 2021-11-15 DIAGNOSIS — F5101 Primary insomnia: Secondary | ICD-10-CM | POA: Diagnosis not present

## 2021-11-15 DIAGNOSIS — Z1331 Encounter for screening for depression: Secondary | ICD-10-CM | POA: Diagnosis not present

## 2021-11-15 DIAGNOSIS — R635 Abnormal weight gain: Secondary | ICD-10-CM | POA: Diagnosis not present

## 2021-11-15 DIAGNOSIS — R0683 Snoring: Secondary | ICD-10-CM

## 2021-11-15 DIAGNOSIS — F331 Major depressive disorder, recurrent, moderate: Secondary | ICD-10-CM | POA: Diagnosis not present

## 2021-11-15 DIAGNOSIS — Z1339 Encounter for screening examination for other mental health and behavioral disorders: Secondary | ICD-10-CM | POA: Diagnosis not present

## 2021-11-15 DIAGNOSIS — K219 Gastro-esophageal reflux disease without esophagitis: Secondary | ICD-10-CM | POA: Diagnosis not present

## 2021-11-15 DIAGNOSIS — Z6841 Body Mass Index (BMI) 40.0 and over, adult: Secondary | ICD-10-CM | POA: Diagnosis not present

## 2021-11-15 DIAGNOSIS — N951 Menopausal and female climacteric states: Secondary | ICD-10-CM | POA: Diagnosis not present

## 2021-11-15 MED ORDER — LEVOCETIRIZINE DIHYDROCHLORIDE 5 MG PO TABS: 5.0000 mg | ORAL_TABLET | Freq: Every evening | ORAL | 3 refills | Status: DC

## 2021-11-15 MED ORDER — METHYLPREDNISOLONE ACETATE 40 MG/ML IJ SUSP
40.0000 mg | Freq: Once | INTRAMUSCULAR | Status: AC
  Administered 2021-11-15: 40 mg via INTRAMUSCULAR

## 2021-11-15 MED ORDER — FLUTICASONE PROPIONATE 50 MCG/ACT NA SUSP: 1.0000 | Freq: Every day | NASAL | 0 refills | Status: DC

## 2021-11-15 NOTE — Telephone Encounter (Signed)
REFERRAL REQUEST ?Telephone Note ? ?Have you been seen at our office for this problem? NO ?(Advise that they may need an appointment with their PCP before a referral can be done) ? ?Reason for Referral: Sleep Study - EKG done today @ [facility] and showed she needed a sleep study done ?Referral discussed with patient: NO  ?Best contact number of patient for referral team: [phone]    ?Has patient been seen by a specialist for this issue before: NO  ?Patient provider preference for referral: Don't matter ?Patient location preference for referral: [facility] or [facility] ?  ?Patient notified that referrals can take up to a week or longer to process. If they haven't heard anything within a week they should call back and speak with the referral department.   ?

## 2021-11-15 NOTE — Progress Notes (Signed)
?  ? ?Subjective:  ?Patient ID: Mary [name], female    DOB: Jun 18, 1981, 41 y.o.   MRN: [id number] ? ?Patient Care Team: ?Baruch Gouty, FNP as PCP - General (Family Medicine)  ? ?Chief Complaint:  Medical Management of Chronic Issues, Cough, and Nasal Congestion ? ? ?HPI: ?Mary [name] is a 41 y.o. female presenting on 11/15/2021 for Medical Management of Chronic Issues, Cough, and Nasal Congestion ? ? ?1. Rhinorrhea ?Pt reports runny nose, postnasal drainage, sneezing, ear fullness, and watery eyes for the past week. No fever, chills, weakness, confusion, malaise, or decreased appetite. Has tried DayQuil and Mucinex for symptoms without relief.  ? ?2. Morbid obesity ([place]) ?3. Metabolic syndrome ?Is currently followed by weight loss clinic and had labs yesterday, will get results today and bring copy to office. Has modified diet and started exercising more regularly. She stopped metformin due to side effects.  ? ?4. Primary hypertension ?Well controlled. Denies headaches, chest pain, visual changes, palpitations, leg swelling, dizziness, or headaches. Currently on Lasix and Losartan. Compliant with medications.  ? ? ? ?Relevant past medical, surgical, family, and social history reviewed and updated as indicated.  ?Allergies and medications reviewed and updated. Data reviewed: Chart in Epic. ? ? ?Past Medical History:  ?Diagnosis Date  ? Allergy   ? Anxiety   ? Asthma   ? Bipolar 1 disorder ([place])   ? Bulging lumbar disc   ? BV (bacterial vaginosis) 02/18/2018  ? Cancer of cervix ([place])   ? Carpal tunnel syndrome on right   ? Cervical cancer ([place])   ? Chronic back pain   ? COPD (chronic obstructive pulmonary disease) ([place])   ? Depression   ? Hypertension   ? Migraines   ? Physiological ovarian cysts   ? Plantar fasciitis   ? PTSD (post-traumatic stress disorder)   ? Rheumatoid arthritis ([place])   ? S/P partial hysterectomy   ? Vaginal Pap smear, abnormal   ? ? ?Past Surgical History:  ?Procedure Laterality  Date  ? ABDOMINAL HYSTERECTOMY    ? ABDOMINAL HYSTERECTOMY    ? EYE SURGERY  age 60  ? Artificial right eye  ? ORIF ELBOW FRACTURE Left 11/18/2019  ? Procedure: OPEN REDUCTION WITH REPAIR VERSUS FRAGMENT EXCISION CAPITELLUM FRACTURE LEFT ELBOW AND LIGAMENTOUS RECONSTRUCTION AS NECESSARY;  Surgeon: Roseanne Kaufman, MD;  Location: [facility];  Service: Orthopedics;  Laterality: Left;  2 HRS  ? ? ?Social History  ? ?Socioeconomic History  ? Marital status: Divorced  ?  Spouse name: Not on file  ? Number of children: 3  ? Years of education: 74  ? Highest education level: High school graduate  ?Occupational History  ? Occupation: Seeking disability  ?Tobacco Use  ? Smoking status: Never  ? Smokeless tobacco: Never  ?Vaping Use  ? Vaping Use: Never used  ?Substance and Sexual Activity  ? Alcohol use: No  ? Drug use: No  ? Sexual activity: Yes  ?  Birth control/protection: Surgical  ?  Comment: hyst  ?Other Topics Concern  ? Not on file  ?Social History Narrative  ? Lives at home with father.  ? Right-handed.  ? No daily caffeine per day.  ? ?Social Determinants of Health  ? ?Financial Resource Strain: Not on file  ?Food Insecurity: Not on file  ?Transportation Needs: Not on file  ?Physical Activity: Not on file  ?Stress: Not on file  ?Social Connections: Not on file  ?Intimate Partner Violence: Not on file  ? ? ?  Outpatient Encounter Medications as of 11/15/2021  ?Medication Sig  ? albuterol (VENTOLIN HFA) 108 (90 Base) MCG/ACT inhaler Inhale 2 puffs into the lungs every 6 (six) hours as needed for wheezing or shortness of breath.  ? Atogepant (QULIPTA) 10 MG TABS Take 10 mg by mouth daily.  ? cholecalciferol (VITAMIN D3) 25 MCG (1000 UNIT) tablet Take 1,000 Units by mouth daily.  ? citalopram (CELEXA) 20 MG tablet Take by mouth.  ? DULERA 100-5 MCG/ACT AERO INHALE 2 PUFFS TWICE DAILY  ? furosemide (LASIX) 40 MG tablet Take 40 mg by mouth.  ? levocetirizine (XYZAL) 5 MG tablet Take 1 tablet (5 mg total) by mouth every evening.  ?  liraglutide (VICTOZA) 18 MG/3ML SOPN Start 0.'6mg'$  SQ once a day for 7 days, then increase to 1.'2mg'$  once a day  ? losartan (COZAAR) 50 MG tablet Take 1 tablet (50 mg total) by mouth daily.  ? melatonin 3 MG TABS tablet Take 3 mg by mouth at bedtime.  ? ondansetron (ZOFRAN-ODT) 4 MG disintegrating tablet Take 1 tablet (4 mg total) by mouth every 8 (eight) hours as needed for nausea or vomiting.  ? traZODone (DESYREL) 100 MG tablet Take by mouth.  ? [DISCONTINUED] fluticasone (FLONASE) 50 MCG/ACT nasal spray Place 1 spray into both nostrils daily.  ? fluticasone (FLONASE) 50 MCG/ACT nasal spray Place 1 spray into both nostrils daily.  ? [DISCONTINUED] metFORMIN (GLUCOPHAGE) 500 MG tablet Take 1 tablet (500 mg total) by mouth 2 (two) times daily with a meal. (Patient not taking: Reported on 11/15/2021)  ? [DISCONTINUED] metoprolol tartrate (LOPRESSOR) 50 MG tablet Take 1 tablet (50 mg total) by mouth as directed. Take (1) tablet 2 hours before your CT scan (Patient not taking: Reported on 11/15/2021)  ? [EXPIRED] methylPREDNISolone acetate (DEPO-MEDROL) injection 40 mg   ? ?No facility-administered encounter medications on file as of 11/15/2021.  ? ? ?Allergies  ?Allergen Reactions  ? Buprenorphine Hcl Anaphylaxis  ?  "it stops my heart"  ? Morphine Anaphylaxis  ?  i quit breathing   ? Morphine And Related Anaphylaxis  ?  "it stops my heart"  ? Penicillins Anaphylaxis and Other (See Comments)  ?  Has patient had a PCN reaction causing immediate rash, facial/tongue/throat swelling, SOB or lightheadedness with hypotension: Yes ?Has patient had a PCN reaction causing severe rash involving mucus membranes or skin necrosis: Yes ?Has patient had a PCN reaction that required hospitalization: Yes ?Has patient had a PCN reaction occurring within the last 10 years: No ?If all of the above answers are "NO", then may proceed with Cephalosporin use. ?  ? Gabapentin Other (See Comments)  ?  Per patient "hallucination"  ? Nsaids   ?  Ibuprofen-Acetaminophen Nausea And Vomiting  ? Latex Rash  ? Tylenol [Acetaminophen] Nausea And Vomiting  ? ? ?Review of Systems  ?Constitutional:  Negative for activity change, appetite change, diaphoresis, fatigue and unexpected weight change.  ?Eyes:  Positive for discharge and itching. Negative for photophobia, pain, redness and visual disturbance.  ?Cardiovascular:  Negative for palpitations and leg swelling.  ?Gastrointestinal:  Negative for blood in stool, constipation, diarrhea, nausea and vomiting.  ?Endocrine: Negative.   ?Genitourinary:  Negative for decreased urine volume, difficulty urinating, dysuria, frequency and urgency.  ?Musculoskeletal:  Negative for arthralgias.  ?Skin: Negative.   ?Allergic/Immunologic: Negative.   ?Neurological:  Negative for dizziness and weakness.  ?Hematological: Negative.   ?Psychiatric/Behavioral:  Negative for confusion, hallucinations, sleep disturbance and suicidal ideas.   ?All other systems  reviewed and are negative. ? ?   ? ?Objective:  ?BP 127/82   Pulse 81   Temp 98 ?F (36.7 ?C) (Oral)   Ht 5' 7.25" (1.708 m)   Wt (!) 337 lb 9.6 oz (153.1 kg)   SpO2 94%   BMI 52.48 kg/m?   ? ?Wt Readings from Last 3 Encounters:  ?11/15/21 (!) 337 lb 9.6 oz (153.1 kg)  ?11/07/21 (!) 348 lb (157.9 kg)  ?08/16/21 (!) 335 lb (152 kg)  ? ? ?Physical Exam ?Vitals and nursing note reviewed.  ?Constitutional:   ?   General: She is not in acute distress. ?   Appearance: Normal appearance. She is well-developed and well-groomed. She is morbidly obese. She is not ill-appearing, toxic-appearing or diaphoretic.  ?HENT:  ?   Head: Normocephalic and atraumatic.  ?   Jaw: There is normal jaw occlusion.  ?   Right Ear: Hearing normal. A middle ear effusion is present. Tympanic membrane is not perforated or erythematous.  ?   Left Ear: Hearing normal. A middle ear effusion is present. Tympanic membrane is not perforated or erythematous.  ?   Nose: Congestion and rhinorrhea present. Rhinorrhea  is clear.  ?   Right Turbinates: Swollen and pale.  ?   Left Turbinates: Swollen and pale.  ?   Mouth/Throat:  ?   Lips: Pink.  ?   Mouth: Mucous membranes are moist.  ?   Pharynx: Oropharynx is clear. [place] mid

## 2021-11-17 ENCOUNTER — Ambulatory Visit (INDEPENDENT_AMBULATORY_CARE_PROVIDER_SITE_OTHER): Payer: Medicaid Other

## 2021-11-17 ENCOUNTER — Ambulatory Visit
Admission: EM | Admit: 2021-11-17 | Discharge: 2021-11-17 | Disposition: A | Discharge status: Home / Self Care | Payer: Medicaid Other | Attending: Family Medicine | Admitting: Family Medicine

## 2021-11-17 ENCOUNTER — Encounter: Payer: Self-pay | Admitting: Emergency Medicine

## 2021-11-17 ENCOUNTER — Other Ambulatory Visit: Payer: Self-pay

## 2021-11-17 DIAGNOSIS — J3089 Other allergic rhinitis: Secondary | ICD-10-CM

## 2021-11-17 DIAGNOSIS — J22 Unspecified acute lower respiratory infection: Secondary | ICD-10-CM | POA: Diagnosis not present

## 2021-11-17 DIAGNOSIS — R059 Cough, unspecified: Secondary | ICD-10-CM | POA: Diagnosis not present

## 2021-11-17 DIAGNOSIS — R062 Wheezing: Secondary | ICD-10-CM | POA: Diagnosis not present

## 2021-11-17 MED ORDER — PREDNISONE 20 MG PO TABS: 40.0000 mg | ORAL_TABLET | Freq: Every day | ORAL | 0 refills | Status: DC

## 2021-11-17 MED ORDER — AZITHROMYCIN 250 MG PO TABS: ORAL_TABLET | ORAL | 0 refills | Status: DC

## 2021-11-17 MED ORDER — PROMETHAZINE-DM 6.25-15 MG/5ML PO SYRP: 5.0000 mL | ORAL_SOLUTION | Freq: Four times a day (QID) | ORAL | 0 refills | Status: DC | PRN

## 2021-11-17 NOTE — ED Provider Notes (Signed)
?[place] ? ? ? ?CSN: [id number] ?Arrival date & time: 11/17/21  1344 ? ? ?  ? ?History   ?Chief Complaint ?Chief Complaint  ?Patient presents with  ? Hoarse  ? ? ?HPI ?Mary [name] is a 41 y.o. female.  ? ?Presenting today with about 2-week history of progressively worsening cough, hoarseness, sore throat, congestion.  Denies fever, chills, chest pain, shortness of breath, abdominal pain, nausea vomiting or diarrhea.  Was seen at PCP a week or so ago, given a course of prednisone and a steroid injection which she states did help but now symptoms have returned and are worsening.  She has a history of seasonal allergies for which she takes Xyzal and nasal spray daily.  She also has albuterol inhalers at home which have not been providing long-term relief.  No new sick contacts recently. ? ?Past Medical History:  ?Diagnosis Date  ? Allergy   ? Anxiety   ? Asthma   ? Bipolar 1 disorder ([place])   ? Bulging lumbar disc   ? BV (bacterial vaginosis) 02/18/2018  ? Cancer of cervix ([place])   ? Carpal tunnel syndrome on right   ? Cervical cancer ([place])   ? Chronic back pain   ? COPD (chronic obstructive pulmonary disease) ([place])   ? Depression   ? Hypertension   ? Migraines   ? Physiological ovarian cysts   ? Plantar fasciitis   ? PTSD (post-traumatic stress disorder)   ? Rheumatoid arthritis ([place])   ? S/P partial hysterectomy   ? Vaginal Pap smear, abnormal   ? ? ?Patient Active Problem List  ? Diagnosis Date Noted  ? Swelling of both ankles 11/07/2021  ? Metabolic syndrome 71/69/6789  ? Chronic pain syndrome 05/24/2021  ? Abnormal EKG 05/24/2021  ? High risk medication use 05/24/2021  ? Allergic rhinitis 05/24/2021  ? Morbid obesity ([place]) 06/11/2020  ? Primary hypertension 06/11/2020  ? Migraine headache without aura 12/02/2019  ? History of cervical cancer 02/06/2018  ? Depression, recurrent ([place]) 02/06/2018  ? Right ovarian cyst 03/19/2014  ? Bilateral sciatica 03/17/2014  ? Chronic low back pain 02/03/2014   ? 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 COPD (chronic obstructive pulmonary disease) ([place]) 02/03/2014  ? GAD (generalized anxiety disorder) 02/03/2014  ? PTSD (post-traumatic stress disorder) 02/03/2014  ? Dyspnea on exertion 11/26/2009  ? ? ?Past Surgical History:  ?Procedure Laterality Date  ? ABDOMINAL HYSTERECTOMY    ? ABDOMINAL HYSTERECTOMY    ? EYE SURGERY  age 81  ? Artificial right eye  ? ORIF ELBOW FRACTURE Left 11/18/2019  ? Procedure: OPEN REDUCTION WITH REPAIR VERSUS FRAGMENT EXCISION CAPITELLUM FRACTURE LEFT ELBOW AND LIGAMENTOUS RECONSTRUCTION AS NECESSARY;  Surgeon: Roseanne Kaufman, MD;  Location: [facility];  Service: Orthopedics;  Laterality: Left;  2 HRS  ? ? ?OB History   ? ? Gravida  ?3  ? Para  ?3  ? Term  ?3  ? Preterm  ?   ? AB  ?   ? Living  ?3  ?  ? ? SAB  ?   ? IAB  ?   ? Ectopic  ?   ? Multiple  ?   ? Live Births  ?   ?   ?  ?  ? ? ?Home Medications   ? ?Prior to Admission medications   ?Medication Sig Start Date End Date Taking? Authorizing Provider  ?azithromycin (ZITHROMAX) 250 MG tablet Take first 2 tablets together, then 1 every day until finished.  11/17/21  Yes Volney American, PA-C  ?predniSONE (DELTASONE) 20 MG tablet Take 2 tablets (40 mg total) by mouth daily with breakfast. 11/17/21  Yes Volney American, PA-C  ?promethazine-dextromethorphan (PROMETHAZINE-DM) 6.25-15 MG/5ML syrup Take 5 mLs by mouth 4 (four) times daily as needed. 11/17/21  Yes Volney American, PA-C  ?albuterol (VENTOLIN HFA) 108 (90 Base) MCG/ACT inhaler Inhale 2 puffs into the lungs every 6 (six) hours as needed for wheezing or shortness of breath. 05/24/21   Baruch Gouty, FNP  ?Atogepant (QULIPTA) 10 MG TABS Take 10 mg by mouth daily. 05/26/21   Baruch Gouty, FNP  ?cholecalciferol (VITAMIN D3) 25 MCG (1000 UNIT) tablet Take 1,000 Units by mouth daily.    [provider]  ?citalopram (CELEXA) 20 MG tablet Take by mouth. 08/10/21   [provider]  ?Ruthe Mannan 100-5 MCG/ACT AERO INHALE 2  PUFFS TWICE DAILY 07/29/21   Rakes, Connye Burkitt, FNP  ?fluticasone (FLONASE) 50 MCG/ACT nasal spray Place 1 spray into both nostrils daily. 11/15/21   Baruch Gouty, FNP  ?furosemide (LASIX) 40 MG tablet Take 40 mg by mouth.    [provider]  ?levocetirizine (XYZAL) 5 MG tablet Take 1 tablet (5 mg total) by mouth every evening. 11/15/21   Rakes, Connye Burkitt, FNP  ?liraglutide (VICTOZA) 18 MG/3ML SOPN Start 0.'6mg'$  SQ once a day for 7 days, then increase to 1.'2mg'$  once a day 06/24/21   Rakes, Connye Burkitt, FNP  ?losartan (COZAAR) 50 MG tablet Take 1 tablet (50 mg total) by mouth daily. 05/24/21   Baruch Gouty, FNP  ?melatonin 3 MG TABS tablet Take 3 mg by mouth at bedtime.    [provider]  ?ondansetron (ZOFRAN-ODT) 4 MG disintegrating tablet Take 1 tablet (4 mg total) by mouth every 8 (eight) hours as needed for nausea or vomiting. 08/16/21   Baruch Gouty, FNP  ?traZODone (DESYREL) 100 MG tablet Take by mouth. 08/10/21   [provider]  ? ?Family History ?Family History  ?Problem Relation Age of Onset  ? Stroke Mother   ?     TIA  ? Asthma Mother   ? Cancer Mother   ? Other Mother   ?     blood platelets are dropping  ? Heart attack Father   ? Hypertension Father   ? Cancer Father   ?     skin cancer  ? Stroke Father   ? Asthma Daughter   ? Sleep apnea Son   ? Diabetes Maternal Grandmother   ? Heart Problems Maternal Grandmother   ? Anuerysm Maternal Grandfather   ? Seizures Maternal Grandfather   ? Cancer Paternal Grandmother   ?     breast  ? Fibromyalgia Sister   ? Cancer Sister   ?     cervical  ? Cancer Sister   ?     cervical  ? Cancer Brother   ?     thyroid cancer  ? Cancer Maternal Aunt   ?     breast cancer  ? Cancer Paternal Aunt   ? ?Social History ?Social History  ? ?Tobacco Use  ? Smoking status: Never  ? Smokeless tobacco: Never  ?Vaping Use  ? Vaping Use: Never used  ?Substance Use Topics  ? Alcohol use: No  ? Drug use: No  ? ? ? ?Allergies   ?Buprenorphine hcl, Morphine, Morphine and  related, Penicillins, Gabapentin, Nsaids, Ibuprofen-acetaminophen, Latex, and Tylenol [acetaminophen] ? ? ?Review of Systems ?Review  of Systems ?Per HPI ? ?Physical Exam ?Triage Vital Signs ?ED Triage Vitals [11/17/21 1404]  ?Enc Vitals Group  ?   BP 139/83  ?   Pulse Rate 65  ?   Resp 18  ?   Temp 97.9 ?F (36.6 ?C)  ?   Temp Source Oral  ?   SpO2 96 %  ?   Weight (!) 338 lb (153.3 kg)  ?   Height 5' 7.25" (1.708 m)  ?   Head Circumference   ?   Peak Flow   ?   Pain Score 8  ?   Pain Loc   ?   Pain Edu?   ?   Excl. in [place]?   ? ?No data found. ? ?Updated Vital Signs ?BP 139/83 (BP Location: Right Arm)   Pulse 65   Temp 97.9 ?F (36.6 ?C) (Oral)   Resp 18   Ht 5' 7.25" (1.708 m)   Wt (!) 338 lb (153.3 kg)   SpO2 96%   BMI 52.55 kg/m?  ? ?Visual Acuity ?Right Eye Distance:   ?Left Eye Distance:   ?Bilateral Distance:   ? ?Right Eye Near:   ?Left Eye Near:    ?Bilateral Near:    ? ?Physical Exam ?Vitals and nursing note reviewed.  ?Constitutional:   ?   Appearance: Normal appearance.  ?HENT:  ?   Head: Atraumatic.  ?   Right Ear: Tympanic membrane and external ear normal.  ?   Left Ear: Tympanic membrane and external ear normal.  ?   Nose: Congestion present.  ?   Mouth/Throat:  ?   Mouth: Mucous membranes are moist.  ?   Pharynx: Posterior oropharyngeal erythema present.  ?Eyes:  ?   Extraocular Movements: Extraocular movements intact.  ?   Conjunctiva/sclera: Conjunctivae normal.  ?Cardiovascular:  ?   Rate and Rhythm: Normal rate and regular rhythm.  ?   Heart sounds: Normal heart sounds.  ?Pulmonary:  ?   Effort: Pulmonary effort is normal.  ?   Breath sounds: Wheezing present. No rales.  ?   Comments: Mild scattered wheezes diffusely ?Musculoskeletal:     ?   General: Normal range of motion.  ?   Cervical back: Normal range of motion and neck supple.  ?Skin: ?   General: Skin is warm and dry.  ?Neurological:  ?   Mental Status: She is alert and oriented to person, place, and time.  ?Psychiatric:     ?   Mood  and Affect: Mood normal.     ?   Thought Content: Thought content normal.  ? ?UC Treatments / Results  ?Labs ?(all labs ordered are listed, but only abnormal results are displayed) ?Labs Reviewed - No data to dis

## 2021-11-17 NOTE — ED Triage Notes (Signed)
Pt reports cough, hoarseness, sore throat x2 weeks. Pt reports was seen at pcp for same and prescribed prednisone and steroid injection. Pt reports medication helped with congestion but reports cough and hoarseness are worse. ? ? Pt reports has also tried otc medication with minimal relief of symptoms.  ?

## 2021-11-19 DIAGNOSIS — Z419 Encounter for procedure for purposes other than remedying health state, unspecified: Secondary | ICD-10-CM | POA: Diagnosis not present

## 2021-12-01 ENCOUNTER — Ambulatory Visit: Payer: Medicaid Other | Admitting: Family Medicine

## 2021-12-02 ENCOUNTER — Ambulatory Visit: Payer: Medicaid Other | Admitting: Adult Health

## 2021-12-14 ENCOUNTER — Encounter: Payer: Self-pay | Admitting: Family Medicine

## 2021-12-19 DIAGNOSIS — Z419 Encounter for procedure for purposes other than remedying health state, unspecified: Secondary | ICD-10-CM | POA: Diagnosis not present

## 2021-12-27 ENCOUNTER — Ambulatory Visit (INDEPENDENT_AMBULATORY_CARE_PROVIDER_SITE_OTHER): Payer: Medicaid Other | Admitting: Nurse Practitioner

## 2021-12-27 VITALS — BP 138/85 | HR 69 | Temp 97.8°F | Ht 67.25 in | Wt 344.0 lb

## 2021-12-27 DIAGNOSIS — Z7689 Persons encountering health services in other specified circumstances: Secondary | ICD-10-CM

## 2021-12-27 DIAGNOSIS — G894 Chronic pain syndrome: Secondary | ICD-10-CM | POA: Diagnosis not present

## 2021-12-27 DIAGNOSIS — E569 Vitamin deficiency, unspecified: Secondary | ICD-10-CM

## 2021-12-27 DIAGNOSIS — E8881 Metabolic syndrome: Secondary | ICD-10-CM | POA: Diagnosis not present

## 2021-12-27 DIAGNOSIS — F339 Major depressive disorder, recurrent, unspecified: Secondary | ICD-10-CM

## 2021-12-27 DIAGNOSIS — R110 Nausea: Secondary | ICD-10-CM

## 2021-12-27 DIAGNOSIS — R0683 Snoring: Secondary | ICD-10-CM | POA: Diagnosis not present

## 2021-12-27 DIAGNOSIS — Z862 Personal history of diseases of the blood and blood-forming organs and certain disorders involving the immune mechanism: Secondary | ICD-10-CM | POA: Diagnosis not present

## 2021-12-27 DIAGNOSIS — R9431 Abnormal electrocardiogram [ECG] [EKG]: Secondary | ICD-10-CM

## 2021-12-27 DIAGNOSIS — R079 Chest pain, unspecified: Secondary | ICD-10-CM

## 2021-12-27 MED ORDER — ONDANSETRON 4 MG PO TBDP: 4.0000 mg | ORAL_TABLET | Freq: Three times a day (TID) | ORAL | 0 refills | Status: DC | PRN

## 2021-12-27 NOTE — Progress Notes (Signed)
? ?Subjective:  ? ? Patient ID: Mary [name], female    DOB: [birth year], 41 y.o.   MRN: [id number] ? ?HPI ? ?41 year old female patient with extensive medical history which includes migraines, HTN, COPD, bulging lumbar disc, RA, partial hysterectomy s/p cervical cancer, anxiety, depression, and abnormal EKG is here to establish care. ? ?Patient has multiple concerns today but we will discuss patient's anxiety, depression, pain management, loud snoring. ? ?Depression, recurrent ([place]) ?Patient states that her depression and anxiety have worsened due to family stressors. Patient states that she used to go to [employer] [place] which helped for a while but she has since stopped going. Patient currently on Celexa 29m daily, but state that it does not help. Patient states that she used use Valium or xanax which helped for her anxiety.  ? ?Patient denies any thoughts of wanting to kill self or anyone else. ? ?Chronic pain syndrome ?Patient states that she has chronic pain from a bulging disc in her lower back and from plantar fascitis. Patient stats that she used to use Roxicodone and Oxycodone for pain management, however her previous provider has since stopped prescribing medications, patient is unsure why. Patient suspect that it may be due to the fact that Marijuana was found in her system.  ? ?Patient does admit to smoking Marijuana to help with pain.  ? ?Loud snoring ?Patient states that she snores loudly and would like to be worked up for sleep apnea. Patient also states that her partner noticed that she will stop breathing in her sleep.  ? ?Abnormal EKG ?Patient states that she has a history of chest pain for multiple years. Patient denies current chest pain today. The last episode of chest pain that she felt was yesterday. When patient does experience chest pain, she describes pain as a dull sharp pain that can be palpated over her upper sternum. Patient also complains of numbness and tingling to both her hands and  feet and occasional SOB.  ? ?Patient states that her EKG's in the past have been "funny" and she has seen cardiology for work up. Patient was seen by Cardiology and had an ECHO in December 2022 and a Coronary CT in November 2022. Patient states that she has not been back to see Cardiology since.  ? ?Coronary CT:  ?1. Minimal nonobstructive CAD, CADRADS = 1. Small focal ?noncalcified, nonobstructive plaque in the proximal RCA. ? 2. Coronary calcium score of 0. This was 0 percentile for age and sex matched control. ? 3. Normal coronary origin with left dominance. did not show any blockages at that time.  ? ?ECHO: ? 1. Left ventricular ejection fraction, by estimation, is 60 to 65%. The left ventricle has normal function. The left ventricle has no regional wall motion abnormalities. The left ventricular internal cavity size was severely dilated. Left ventricular diastolic parameters are indeterminate. Elevated left atrial pressure.  ? 2. Right ventricular systolic function is normal. The right ventricular size is normal. Tricuspid regurgitation signal is inadequate for assessing PA pressure.  ? 3. The mitral valve is normal in structure. No evidence of mitral valve regurgitation. No evidence of mitral stenosis.  ? 4. The aortic valve is tricuspid. Aortic valve regurgitation is not visualized. No aortic stenosis is present.  ? 5. The inferior vena cava is normal in size with greater than 50% respiratory variability, suggesting right atrial pressure of 3 mmHg.  ? 6. Cannot exclude a small PFO.  ? ? ? ?Review of Systems  ?HENT:    ?  Snoring  ?Psychiatric/Behavioral:    ?     Depression and anxiety  ? ?   ?Objective:  ? Physical Exam ?Vitals reviewed.  ?Constitutional:   ?   General: She is not in acute distress. ?   Appearance: Normal appearance. She is normal weight. She is not ill-appearing, toxic-appearing or diaphoretic.  ?HENT:  ?   Head: Normocephalic and atraumatic.  ?Cardiovascular:  ?   Rate and Rhythm:  Normal rate and regular rhythm.  ?   Pulses: Normal pulses.  ?   Heart sounds: No murmur heard. ?Pulmonary:  ?   Effort: Pulmonary effort is normal. No respiratory distress.  ?   Breath sounds: Normal breath sounds. No wheezing.  ?Abdominal:  ?   General: Bowel sounds are normal.  ?   Palpations: Abdomen is soft.  ?Musculoskeletal:     ?   General: Swelling present.  ?   Comments: Grossly intact ? ?Moderate amount of leg swelling to bilateral legs.   ?Skin: ?   General: Skin is warm.  ?   Capillary Refill: Capillary refill takes less than 2 seconds.  ?Neurological:  ?   Mental Status: She is alert.  ?   Comments: Grossly intact. 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   ?Psychiatric:     ?   Mood and Affect: Mood normal.     ?   Behavior: Behavior normal.  ? ? ? ?   ?Assessment & Plan:  ? ?1. Encounter to establish care ?- Patient to return to clinic in 2 weeks to discuss weight loss and use of ozempic. Patient already on Ozempic and states she may need authorization to continue.  ?- Plantar fascitis and back pain ?- Rheumatoid arthritis. Patient does not see a specialist because of financial constraints.  ? ?2. Depression, recurrent ([place]) ?-PHQ-9 today 20. ?-GAD-7 score 21 today. ?-Patient to continue with Celexa 20 mg. ?-Provider does not feel comfortable prescribing Valium or Xanax. ?-We will refer patient to psychiatry for further evaluation to determine if Xanax and Valium is a beneficial pharmacotherapy for patient. ?- Ambulatory referral to Psychiatry ?-Return to clinic in 2 weeks ? ?3. Chronic pain syndrome ?-Patient referred to pain clinic to be evaluated for appropriateness of Roxicodone and oxycodone prescriptions ?- Ambulatory referral to Pain Clinic ?-Return to clinic in 2 weeks ? ?4. Loud snoring ?-Patient referred for sleep study to evaluate for presence of sleep apnea. ?- Ambulatory referral to Sleep Studies ?-Return to clinic in 2 weeks ? ?5. Chest Pain ?-Patient  has interesting cardiac history.  I believe patient needs to be followed up with cardiology. ?-Patient appears to be cardiovascularly stable at this time. ?-EKG done today and no changes from previous EKGs were noted. EKG over the past 3 years have noted T-wave abnormalities.  ?- EKG 12-Lead ?- Ambulatory referral to Cardiology, urgent ?-Return to clinic in 2 weeks ?-If you develop worsening chest pain, shortness of breath, radiating pain to your left arm or left jaw go to the emergency room to be evaluated. ? ?6. Morbid obesity ([place]) ?- CMP14+EGFR ?- Lipid Profile ?- TSH + free T4 ? ?7. Vitamin deficiency ?- Vitamin D (25 hydroxy) ?- B12 and Folate Panel ? ?8. Metabolic syndrome ?- HgB D7O ? ?9. History of anemia ?- CBC with Differential ? ?10. Nausea ?- ondansetron (ZOFRAN-ODT) 4 MG disintegrating tablet; Take 1 tablet (4 mg total) by mouth every 8 (eight) hours as needed  for nausea or vomiting.  Dispense: 20 tablet; Refill: 0 ? ?  ?Note:  This document was prepared using Dragon voice recognition software and may include unintentional dictation errors. ?Note - This record has been created using [employer].  ?Chart creation errors have been sought, but may not always  ?have been located. Such creation errors do not reflect on  ?the standard of medical care. ? ?

## 2021-12-28 ENCOUNTER — Encounter: Payer: Self-pay | Admitting: Nurse Practitioner

## 2021-12-28 LAB — CMP14+EGFR
ALT: 29 IU/L (ref 0–32)
AST: 24 IU/L (ref 0–40)
Albumin/Globulin Ratio: 1.9 (ref 1.2–2.2)
Albumin: 4.9 g/dL — ABNORMAL HIGH (ref 3.8–4.8)
Alkaline Phosphatase: 117 IU/L (ref 44–121)
BUN/Creatinine Ratio: 13 (ref 9–23)
BUN: 12 mg/dL (ref 6–24)
Bilirubin Total: 0.6 mg/dL (ref 0.0–1.2)
CO2: 23 mmol/L (ref 20–29)
Calcium: 9.5 mg/dL (ref 8.7–10.2)
Chloride: 102 mmol/L (ref 96–106)
Creatinine, Ser: 0.95 mg/dL (ref 0.57–1.00)
Globulin, Total: 2.6 g/dL (ref 1.5–4.5)
Glucose: 84 mg/dL (ref 70–99)
Potassium: 4.4 mmol/L (ref 3.5–5.2)
Sodium: 143 mmol/L (ref 134–144)
Total Protein: 7.5 g/dL (ref 6.0–8.5)
eGFR: 77 mL/min/{1.73_m2} (ref >59)

## 2021-12-28 LAB — CBC WITH DIFFERENTIAL/PLATELET
Basophils Absolute: 0 10*3/uL (ref 0.0–0.2)
Basos: 0 %
EOS (ABSOLUTE): 0.1 10*3/uL (ref 0.0–0.4)
Eos: 1 %
Hematocrit: 38.4 % (ref 34.0–46.6)
Hemoglobin: 12.8 g/dL (ref 11.1–15.9)
Immature Grans (Abs): 0 10*3/uL (ref 0.0–0.1)
Immature Granulocytes: 0 %
Lymphocytes Absolute: 1.6 10*3/uL (ref 0.7–3.1)
Lymphs: 24 %
MCH: 28.1 pg (ref 26.6–33.0)
MCHC: 33.3 g/dL (ref 31.5–35.7)
MCV: 84 fL (ref 79–97)
Monocytes Absolute: 0.4 10*3/uL (ref 0.1–0.9)
Monocytes: 6 %
Neutrophils Absolute: 4.8 10*3/uL (ref 1.4–7.0)
Neutrophils: 69 %
Platelets: 253 10*3/uL (ref 150–450)
RBC: 4.55 x10E6/uL (ref 3.77–5.28)
RDW: 14 % (ref 11.7–15.4)
WBC: 6.9 10*3/uL (ref 3.4–10.8)

## 2021-12-28 LAB — LIPID PANEL
Chol/HDL Ratio: 3.1 ratio (ref 0.0–4.4)
Cholesterol, Total: 160 mg/dL (ref 100–199)
HDL: 51 mg/dL (ref >39)
LDL Chol Calc (NIH): 94 mg/dL (ref 0–99)
Triglycerides: 76 mg/dL (ref 0–149)
VLDL Cholesterol Cal: 15 mg/dL (ref 5–40)

## 2021-12-28 LAB — TSH+FREE T4
Free T4: 1.57 ng/dL (ref 0.82–1.77)
TSH: 0.94 u[IU]/mL (ref 0.450–4.500)

## 2021-12-28 LAB — HEMOGLOBIN A1C
Est. average glucose Bld gHb Est-mCnc: 105 mg/dL
Hgb A1c MFr Bld: 5.3 % (ref 4.8–5.6)

## 2021-12-28 LAB — B12 AND FOLATE PANEL
Folate: 4.4 ng/mL (ref >3.0)
Vitamin B-12: 328 pg/mL (ref 232–1245)

## 2021-12-28 LAB — VITAMIN D 25 HYDROXY (VIT D DEFICIENCY, FRACTURES): Vit D, 25-Hydroxy: 20.3 ng/mL — ABNORMAL LOW (ref 30.0–100.0)

## 2021-12-29 ENCOUNTER — Other Ambulatory Visit: Payer: Self-pay | Admitting: Nurse Practitioner

## 2021-12-29 MED ORDER — VITAMIN D (ERGOCALCIFEROL) 1.25 MG (50000 UNIT) PO CAPS: 50000.0000 [IU] | ORAL_CAPSULE | ORAL | 0 refills | Status: DC

## 2022-01-03 ENCOUNTER — Encounter: Payer: Self-pay | Admitting: Adult Health

## 2022-01-03 ENCOUNTER — Ambulatory Visit (INDEPENDENT_AMBULATORY_CARE_PROVIDER_SITE_OTHER): Payer: Medicaid Other | Admitting: Adult Health

## 2022-01-03 ENCOUNTER — Other Ambulatory Visit: Payer: Medicaid Other | Admitting: Adult Health

## 2022-01-03 VITALS — BP 159/100 | HR 59 | Ht 69.0 in | Wt 344.8 lb

## 2022-01-03 DIAGNOSIS — I10 Essential (primary) hypertension: Secondary | ICD-10-CM | POA: Insufficient documentation

## 2022-01-03 DIAGNOSIS — Z01419 Encounter for gynecological examination (general) (routine) without abnormal findings: Secondary | ICD-10-CM | POA: Diagnosis not present

## 2022-01-03 DIAGNOSIS — Z1211 Encounter for screening for malignant neoplasm of colon: Secondary | ICD-10-CM | POA: Diagnosis not present

## 2022-01-03 LAB — HEMOCCULT GUIAC POC 1CARD (OFFICE): Fecal Occult Blood, POC: NEGATIVE

## 2022-01-03 NOTE — Progress Notes (Signed)
Patient ID: Mary [name], female   DOB: 1981-05-05, 41 y.o.   MRN: [id number] ?History of Present Illness: ?Jenny Reichmann is a 41 year old white female, single, sp hysterectomy in for well woman gyn exam ?Lab Results  ?Component Value Date  ? DIAGPAP  06/11/2020  ?  - Negative for intraepithelial lesion or malignancy (NILM)  ? HPV NOT DETECTED 02/06/2018  ? [place] Negative 06/11/2020  ?  ?PCP is L Ameduite NP ? ?Current Medications, Allergies, Past Medical History, Past Surgical History, Family History and Social History were reviewed in [employer] record.   ? ? ?Review of Systems: ?Patient denies any  hearing loss, fatigue, blurred vision, shortness of breath, chest pain, abdominal pain, problems with bowel movements, urination, or intercourse(no desire). No joint pain or mood swings.  ?Has rectal bleeding at times.  ?Has had headache all weekend, and Lenoria Chime has not helped, has chronic back pain ?She says she is supposed to see pain management and get a sleep study and see cardiology. ? ? ? ?Physical Exam:BP (!) 159/100 (BP Location: Left Arm, Cuff Size: Normal) Comment (Cuff Size): below elbow  Pulse (!) 59   Ht '5\' 9"'$  (1.753 m)   Wt (!) 344 lb 12.8 oz (156.4 kg)   BMI 50.92 kg/m?   she has not taken BP meds today ?General:  Well developed, well nourished, no acute distress ?Skin:  Warm and dry ?Neck:  Midline trachea, normal thyroid, good ROM, no lymphadenopathy ?Lungs; Clear to auscultation bilaterally ?Breast:  No dominant palpable mass, retraction, or nipple discharge, has some tenderness right chest wall ?Cardiovascular: Regular rate and rhythm ?Abdomen:  Soft, non tender, no hepatosplenomegaly ?Pelvic:  External genitalia is normal in appearance, no lesions.  The vagina is normal in appearance. Urethra has no lesions or masses. The cervix and uterus are absent.  No adnexal masses or tenderness noted.Bladder is non tender, no masses felt. ?Rectal: Good sphincter tone, no polyps, or  hemorrhoids felt.  Hemoccult negative. ?Extremities/musculoskeletal:  No varicosities noted, no clubbing or cyanosis, has swelling BLE, non pitting, she says she wakes up with swelling ?Psych:  No mood changes, alert and cooperative,seems happy ?AA is 0 ?Fall risk is low ? ?  01/03/2022  ? 10:30 AM 12/28/2021  ?  9:39 AM 11/15/2021  ? 10:34 AM  ?Depression screen PHQ 2/9  ?Decreased Interest '2 2 2  '$ ?Down, Depressed, Hopeless '3 3 2  '$ ?PHQ - 2 Score '5 5 4  '$ ?Altered sleeping '3 3 3  '$ ?Tired, decreased energy '2 3 3  '$ ?Change in appetite '3 2 3  '$ ?Feeling bad or failure about yourself  '3 3 3  '$ ?Trouble concentrating '3 3 3  '$ ?Moving slowly or fidgety/restless 0 3 2  ?Suicidal thoughts 0 0 0  ?PHQ-9 Score '19 22 21  '$ ?Difficult doing work/chores  Extremely dIfficult Very difficult  ? She is on meds ? ?  01/03/2022  ? 10:30 AM 12/28/2021  ?  9:39 AM 11/15/2021  ? 10:35 AM 08/16/2021  ? 10:08 AM  ?GAD 7 : Generalized Anxiety Score  ?Nervous, Anxious, on Edge '3 3 3 2  '$ ?Control/stop worrying '3 3 3 2  '$ ?Worry too much - different things '3 3 3 3  '$ ?Trouble relaxing '3 3 3 3  '$ ?Restless '3 3 2 2  '$ ?Easily annoyed or irritable '3 3 2 2  '$ ?Afraid - awful might happen '3 3 2 '$ 0  ?Total GAD 7 Score '21 21 18 14  '$ ?Anxiety Difficulty  Extremely  difficult Very difficult   ? ?  ? [place] - 01/03/22 1030   ? ?  ? Pregnancy Intention Screening  ? Does the patient want to become pregnant in the next year? N/A   ? Does the patient's partner want to become pregnant in the next year? N/A   ? Would the patient like to discuss contraceptive options today? N/A   ?  ? Contraception Wrap Up  ? Current Method Female Sterilization   hyst  ? End Method Female Sterilization   hyst  ? Contraception Counseling Provided No   ? ?  ?  ? ?  ?  ?Examination chaperoned by Marcelino Scot RN ? ?Impression and Plan: ?1. Well woman exam with routine gynecological exam ?Physical in 1 year ?Labs with PCP ? ?2. Encounter for screening fecal occult blood testing ?Hemoccult negative  ? ?3.  Hypertension, unspecified type ?Take BP when gets home  ?Follow up with PCP 01/10/22, and let her know if headache not better and about swelling  ? ? ? ?  ?  ?

## 2022-01-05 ENCOUNTER — Encounter: Payer: Self-pay | Admitting: Physical Medicine & Rehabilitation

## 2022-01-10 ENCOUNTER — Encounter: Payer: Self-pay | Admitting: Nurse Practitioner

## 2022-01-10 ENCOUNTER — Ambulatory Visit (INDEPENDENT_AMBULATORY_CARE_PROVIDER_SITE_OTHER): Payer: Medicaid Other | Admitting: Nurse Practitioner

## 2022-01-10 DIAGNOSIS — G479 Sleep disorder, unspecified: Secondary | ICD-10-CM | POA: Diagnosis not present

## 2022-01-10 DIAGNOSIS — M25472 Effusion, left ankle: Secondary | ICD-10-CM

## 2022-01-10 DIAGNOSIS — M25471 Effusion, right ankle: Secondary | ICD-10-CM

## 2022-01-10 MED ORDER — TORSEMIDE 20 MG PO TABS: 20.0000 mg | ORAL_TABLET | Freq: Every day | ORAL | 1 refills | Status: DC

## 2022-01-10 NOTE — Patient Instructions (Signed)
Referral sent to: [facility] [place]. [address], [place] [phone]

## 2022-01-10 NOTE — Progress Notes (Signed)
Subjective:    Patient ID: Mary [name], female    DOB: 10/25/1980, 41 y.o.   MRN: [id number]  HPI  41 year old female with history of migraines, hypertension, COPD, bulging lumbar disc, anxiety, bipolar, depression, obesity, and chest pain patient arrives to discuss weight loss and Ozempic, swelling to her legs, and trouble sleeping.  Morbid obesity (Mary [name]) Patient currently on Ozempic 1 mg.  Patient states that Ozempic was prescribed by weight management.  Patient states that weight management will not continue to see patient until she is had a sleep study.  Patient states that she has now lost any weight while being on Ozempic wants to know if Mary [name] is an option for her.  Swelling of both ankles Patient states that she has noticed increased swelling to her feet and legs over the past couple days.  Patient normally takes 40 mg of Lasix however she states that the Lasix has not been working lately.   Sleep disturbance Patient states that she needs a sleep study in order to get back in with weight management.  Patient also concerned about loud snoring and would like to be evaluated for sleep apnea.  A referral was placed during last visit however patient states she never got the phone call as [place] area [place] very [place] phone reception.    Patient stating that she is having difficulty staying asleep.  Patient currently takes trazodone which she states is not helping anymore.  Review of Systems  Cardiovascular:  Positive for leg swelling.  All other systems reviewed and are negative.     Objective:   Physical Exam Vitals reviewed.  Constitutional:      General: She is not in acute distress.    Appearance: Normal appearance. She is obese. She is not ill-appearing, toxic-appearing or diaphoretic.  Cardiovascular:     Rate and Rhythm: Normal rate and regular rhythm.     Pulses: Normal pulses.     Heart sounds: Normal heart sounds. No murmur heard. Pulmonary:      Effort: Pulmonary effort is normal. No respiratory distress.     Breath sounds: Normal breath sounds. No wheezing.  Abdominal:     General: Abdomen is flat. Bowel sounds are normal.     Palpations: Abdomen is soft.  Musculoskeletal:     Comments: Grossly intact  Skin:    General: Skin is warm.     Capillary Refill: Capillary refill takes less than 2 seconds.  Neurological:     Mental Status: She is alert.     Comments: Grossly intact.  Left eye injury at baseline  Psychiatric:        Mood and Affect: Mood normal.        Behavior: Behavior normal.          Assessment & Plan:   1. Morbid obesity ([name]) -We will refer patient back to weight management for the management of her Ozempic -Suggested the patient that if Ozempic is not working for her then it may be wise to stop the Ozempic. -Patient concerned that if she stops insurance may not pick up the cost again -Patient to follow-up with weight management to discuss her options and to see if Mary [name] is an option for her -Return to clinic in 3 weeks  2. Swelling of both ankles -Patient to stop furosemide 40 mg and to start torsemide 20 mg - torsemide (DEMADEX) 20 MG tablet; Take 1 tablet (20 mg total) by mouth daily.  Dispense: 30 tablet;  Refill: 1 -Patient has cardiology appointment on May 25. -Return to clinic in 3 weeks -Get lab work in 10 days  3. Sleep disturbance -We will replace sleep medicine referral -Would like for patient to be evaluated for sleep apnea prior to adding any additional pharmacotherapy agents for sleep.  Patient agreed with plan - Ambulatory referral to Sleep Studies -Return to clinic in 3 weeks    Note:  This document was prepared using Dragon voice recognition software and may include unintentional dictation errors. Note - This record has been created using [employer].  Chart creation errors have been sought, but may not always  have been located. Such creation errors do not reflect on   the standard of medical care.

## 2022-01-11 NOTE — Progress Notes (Signed)
Cardiology Office Note:    Date:  01/12/2022   ID:  Mary [name], DOB 29-Dec-1980, MRN [id number]  PCP:  [facility], Trenton Gammon, NP   [facility] Providers Cardiologist:  None     Referring MD: Mary Shown, NP   Follow up- review an abnormal ECG  History of Present Illness:    Mary [name] is a 41 y.o. 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  She was seen by Dr. Marlou [name] in 05/2021 for evaluation of an abnormal ECG with anterior TWIs, she did admit to shortness of breath and chest pain at that time.  Follow up cardiac CT 07/01/21 showed a calcium score of 0 with a small focal noncalcified, nonobstructive plaque in the proximal RCA.  Echo 07/28/21 showed EF 60-65%, left ventricular diastolic parameters were indeterminate, severe LV internal cavity dilation and elevated left atrial pressures.   She was recently seen by her PCP and started on Torsemide for LE edema. Her ECG was noted to be abnormal and she was added onto my schedule for evaluation.  Today the patient presents to clinic for follow up. She has chronic back and leg pain. She was on chronic pain meds for this but then weaned her off all of these. Her husband died from a narcotic OD. She does [place] marijuana to help her sleep at night. She cannot go to a pain clinic because of this. She is now on disability due to pain. She is debilitated by chronic pain, dyspnea and fatigue. She cant stand at her sink to do dishes for more than 5 minutes. She is disgusted with the amount of weight she has gained. She cannot do the things she loves due to shortness of breath. She has a sleep study coming up. No chest pain.    Past Medical History:  Diagnosis Date   Allergy    Anxiety    Asthma    Bipolar 1 disorder (HCC)    Bulging lumbar disc    BV (bacterial vaginosis) 02/18/2018    Cancer of cervix (HCC)    Carpal tunnel syndrome on right    Cervical cancer (HCC)    Chronic back pain    COPD (chronic obstructive pulmonary disease) (HCC)    Depression    Hypertension    Migraines    Physiological ovarian cysts    Plantar fasciitis    PTSD (post-traumatic stress disorder)    Rheumatoid arthritis (HCC)    S/P partial hysterectomy    Vaginal Pap smear, abnormal     Past Surgical History:  Procedure Laterality Date   ABDOMINAL HYSTERECTOMY     ABDOMINAL HYSTERECTOMY     EYE SURGERY  age 49   Artificial right eye   ORIF ELBOW FRACTURE Left 11/18/2019   Procedure: OPEN REDUCTION WITH REPAIR VERSUS FRAGMENT EXCISION CAPITELLUM FRACTURE LEFT ELBOW AND LIGAMENTOUS RECONSTRUCTION AS NECESSARY;  Surgeon: Mary Kaufman, MD;  Location: [place];  Service: Orthopedics;  Laterality: Left;  2 HRS    Current Medications: Current Meds  Medication Sig   albuterol (VENTOLIN HFA) 108 (90 Base) MCG/ACT inhaler Inhale 2 puffs into the lungs every 6 (six) hours as needed for wheezing or shortness of breath.   Atogepant (QULIPTA) 10 MG TABS Take 10 mg by mouth daily.   citalopram (CELEXA) 20 MG tablet Take by mouth.   dapagliflozin  propanediol (FARXIGA) 10 MG TABS tablet Take 1 tablet (10 mg total) by mouth daily before breakfast.   DULERA 100-5 MCG/ACT AERO INHALE 2 PUFFS TWICE DAILY   fluticasone (FLONASE) 50 MCG/ACT nasal spray Place 1 spray into both nostrils daily.   levocetirizine (XYZAL) 5 MG tablet Take 1 tablet (5 mg total) by mouth every evening.   losartan (COZAAR) 50 MG tablet Take 1 tablet (50 mg total) by mouth daily.   melatonin 3 MG TABS tablet Take 3 mg by mouth at bedtime.   ondansetron (ZOFRAN-ODT) 4 MG disintegrating tablet Take 1 tablet (4 mg total) by mouth every 8 (eight) hours as needed for nausea or vomiting.   Semaglutide, 1 MG/DOSE, (OZEMPIC, 1 MG/DOSE,) 4 MG/3ML SOPN 1 mg   spironolactone (ALDACTONE) 25 MG tablet Take 0.5 tablets (12.5 mg total) by mouth  daily.   torsemide (DEMADEX) 20 MG tablet Take 1 tablet (20 mg total) by mouth daily.   traZODone (DESYREL) 100 MG tablet Take by mouth.   Vitamin D, Ergocalciferol, (DRISDOL) 1.25 MG (50000 UNIT) CAPS capsule Take 1 capsule (50,000 Units total) by mouth every 7 (seven) days.     Allergies:   Buprenorphine hcl, Morphine, Morphine and related, Penicillins, Gabapentin, Nsaids, Sulfa antibiotics, Ibuprofen-acetaminophen, Latex, and Tylenol [acetaminophen]   Social History   Socioeconomic History   Marital status: Significant Other    Spouse name: Not on file   Number of children: 3   Years of education: 12   Highest education level: High school graduate  Occupational History   Occupation: Seeking disability  Tobacco Use   Smoking status: Never   Smokeless tobacco: Never  Vaping Use   Vaping Use: Never used  Substance and Sexual Activity   Alcohol use: No   Drug use: Yes    Types: Marijuana   Sexual activity: Not Currently    Birth control/protection: Surgical    Comment: hyst  Other Topics Concern   Not on file  Social History Narrative   Lives at home with father.   Right-handed.   No daily caffeine per day.   Social Determinants of Health   Financial Resource Strain: High Risk   Difficulty of Paying Living Expenses: Very hard  Food Insecurity: Food Insecurity Present   Worried About Charity fundraiser in the Last Year: Sometimes true   Ran Out of Food in the Last Year: Sometimes true  Transportation Needs: No Transportation Needs   Lack of Transportation (Medical): No   Lack of Transportation (Non-Medical): No  Physical Activity: Insufficiently Active   Days of Exercise per Week: 3 days   Minutes of Exercise per Session: 40 min  Stress: Stress Concern Present   Feeling of Stress : Very much  Social Connections: Moderately Integrated   Frequency of Communication with Friends and Family: More than three times a week   Frequency of Social Gatherings with Friends and  Family: More than three times a week   Attends Religious Services: More than 4 times per year   Active Member of [employer] or Organizations: No   Attends Music therapist: Never   Marital Status: Living with partner     Family History: The patient's family history includes Anuerysm in her maternal grandfather; Asthma in her daughter and mother; Cancer in her brother, father, maternal aunt, mother, paternal aunt, paternal grandmother, sister, and sister; Diabetes in her maternal grandmother; Fibromyalgia in her sister; Heart Problems in her maternal grandmother; Heart attack in her father; Hypertension in her  father; Other in her mother; Seizures in her maternal grandfather; Sleep apnea in her son; Stroke in her father and mother.  ROS:   Please see the history of present illness.    All other systems reviewed and are negative.  EKGs/Labs/Other Studies Reviewed:    The following studies were reviewed today:  Cardiac CT 06/2021 IMPRESSION: 1. Minimal nonobstructive CAD, CADRADS = 1. Small focal noncalcified, nonobstructive plaque in the proximal RCA.   2. Coronary calcium score of 0. This was 0 percentile for age and sex matched control.   3. Normal coronary origin with left dominance.   __________________  Echo 07/28/21 IMPRESSIONS  1. Left ventricular ejection fraction, by estimation, is 60 to 65%. The  left ventricle has normal function. The left ventricle has no regional  wall motion abnormalities. The left ventricular internal cavity size was  severely dilated. Left ventricular  diastolic parameters are indeterminate. Elevated left atrial pressure.   2. Right ventricular systolic function is normal. The right ventricular  size is normal. Tricuspid regurgitation signal is inadequate for assessing  PA pressure.   3. The mitral valve is normal in structure. No evidence of mitral valve  regurgitation. No evidence of mitral stenosis.   4. The aortic valve is tricuspid.  Aortic valve regurgitation is not  visualized. No aortic stenosis is present.   5. The inferior vena cava is normal in size with greater than 50%  respiratory variability, suggesting right atrial pressure of 3 mmHg.   6. Cannot exclude a small PFO.   Comparison(s): No prior Echocardiogram.    EKG:  EKG is ordered today.  The ekg ordered today demonstrates sinus with non specific ST/TW changes. HR 64  Recent Labs: 12/27/2021: ALT 29; BUN 12; Creatinine, Ser 0.95; Hemoglobin 12.8; Platelets 253; Potassium 4.4; Sodium 143; TSH 0.940  Recent Lipid Panel    Component Value Date/Time   CHOL 160 12/27/2021 1125   TRIG 76 12/27/2021 1125   HDL 51 12/27/2021 1125   CHOLHDL 3.1 12/27/2021 1125   CHOLHDL 2.8 10/17/2019 0842   VLDL 10 10/17/2019 0842   LDLCALC 94 12/27/2021 1125     Risk Assessment/Calculations:           Physical Exam:    VS:  BP (!) 142/106 (BP Location: Right Arm, Patient Position: Sitting, Cuff Size: Large)   Pulse 64   Ht '5\' 9"'$  (1.753 m)   Wt (!) 349 lb (158.3 kg)   BMI 51.54 kg/m     Wt Readings from Last 3 Encounters:  01/12/22 (!) 349 lb (158.3 kg)  01/10/22 (!) 348 lb 6.4 oz (158 kg)  01/03/22 (!) 344 lb 12.8 oz (156.4 kg)     GEN:  Well nourished, well developed in no acute distress, obese HEENT: Normal NECK: No JVD; No carotid bruits LYMPHATICS: No lymphadenopathy CARDIAC: RRR, no murmurs, rubs, gallops RESPIRATORY:  Clear to auscultation without rales, wheezing or rhonchi  ABDOMEN: Soft, non-tender, non-distended MUSCULOSKELETAL:  1+ bilateral LE edema SKIN: Warm and dry NEUROLOGIC:  Alert and oriented x 3 PSYCHIATRIC:  Normal affect   ASSESSMENT:    1. Abnormal EKG   2. Chronic diastolic CHF (congestive heart failure) ([place])   3. Morbid obesity ([place])   4. Hypertension, unspecified type    PLAN:    In order of problems listed above:  ABNORMAL ECG: poor R wave progression and non specific TW changes. ECG today with similar findings.  Had an extensive cardiac work up with no evidence of CAD.  Likely just her baseline ECG. No further work up recommended.   Acute on chronic diastolic CHF with LE edema: continue on torsemide '20mg'$  daily (she has yet to pick this up). Low salt diet. Compression stockings. Will check a BNP and also start Farixga and sprio 12.5 mg daily. Has follow up labs with PCP. I will see her back in 1 month to see if this has helped dyspnea.    Morbid obesity: currently on weight loss meds - Ozempic. Very frustrated with his. I think she needs to give the medicine more time to work.   HTN: Bp elevated today. Rechecked by myself and similar reading. Will add spiro today. Continue losartan '50mg'$  daily.   Depression: I think some of her symptoms are depression related. I have asked her to see a psychiatrist to try different medications.    Medication Adjustments/Labs and Tests Ordered: Current medicines are reviewed at length with the patient today.  Concerns regarding medicines are outlined above.  Orders Placed This Encounter  Procedures   Pro b natriuretic peptide (BNP)   EKG 12-Lead   Meds ordered this encounter  Medications   spironolactone (ALDACTONE) 25 MG tablet    Sig: Take 0.5 tablets (12.5 mg total) by mouth daily.    Dispense:  45 tablet    Refill:  1   dapagliflozin propanediol (FARXIGA) 10 MG TABS tablet    Sig: Take 1 tablet (10 mg total) by mouth daily before breakfast.    Dispense:  90 tablet    Refill:  1    Patient Instructions  Medication Instructions:    START TAKING : FARXIGA  10 MG ONCE A DAY    START TAKING : SPIRONOLACTONE 12.5 MG ONCE A DAY   *If you need a refill on your cardiac medications before your next appointment, please call your pharmacy*   Lab Work:  BNP TODAY    If you have labs (blood work) drawn today and your tests are completely normal, you will receive your results only by: [place] (if you have MyChart) OR A paper copy in the mail If you  have any lab test that is abnormal or we need to change your treatment, we will call you to review the results.   Testing/Procedures: NONE ORDERED  TODAY    Follow-Up: At [facility], you and your health needs are our priority.  As part of our continuing mission to provide you with exceptional heart care, we have created designated Provider Care Teams.  These Care Teams include your primary Cardiologist (physician) and Advanced Practice Providers (APPs -  Physician Assistants and Nurse Practitioners) who all work together to provide you with the care you need, when you need it.  We recommend signing up for the patient portal called "MyChart".  Sign up information is provided on this After Visit Summary.  MyChart is used to connect with patients for Virtual Visits (Telemedicine).  Patients are able to view lab/test results, encounter notes, upcoming appointments, etc.  Non-urgent messages can be sent to your provider as well.   To learn more about what you can do with MyChart, go to [email or web address].    Your next appointment:  AS SCHEDULED   The format for your next appointment:   In Person  Other Instructions   Important Information About Sugar         Signed, Angelena Form, PA-C  01/12/2022 3:38 PM    [place]

## 2022-01-12 ENCOUNTER — Ambulatory Visit (INDEPENDENT_AMBULATORY_CARE_PROVIDER_SITE_OTHER): Payer: Medicaid Other | Admitting: Physician Assistant

## 2022-01-12 ENCOUNTER — Encounter: Payer: Self-pay | Admitting: Physician Assistant

## 2022-01-12 VITALS — BP 142/106 | HR 64 | Ht 69.0 in | Wt 349.0 lb

## 2022-01-12 DIAGNOSIS — R9431 Abnormal electrocardiogram [ECG] [EKG]: Secondary | ICD-10-CM

## 2022-01-12 DIAGNOSIS — I10 Essential (primary) hypertension: Secondary | ICD-10-CM | POA: Diagnosis not present

## 2022-01-12 DIAGNOSIS — I5032 Chronic diastolic (congestive) heart failure: Secondary | ICD-10-CM | POA: Diagnosis not present

## 2022-01-12 MED ORDER — DAPAGLIFLOZIN PROPANEDIOL 10 MG PO TABS: 10.0000 mg | ORAL_TABLET | Freq: Every day | ORAL | 1 refills | Status: DC

## 2022-01-12 MED ORDER — SPIRONOLACTONE 25 MG PO TABS: 12.5000 mg | ORAL_TABLET | Freq: Every day | ORAL | 1 refills | Status: DC

## 2022-01-12 NOTE — Patient Instructions (Signed)
Medication Instructions:    START TAKING : FARXIGA  10 MG ONCE A DAY    START TAKING : SPIRONOLACTONE 12.5 MG ONCE A DAY   *If you need a refill on your cardiac medications before your next appointment, please call your pharmacy*   Lab Work:  BNP TODAY    If you have labs (blood work) drawn today and your tests are completely normal, you will receive your results only by: [place] (if you have MyChart) OR A paper copy in the mail If you have any lab test that is abnormal or we need to change your treatment, we will call you to review the results.   Testing/Procedures: NONE ORDERED  TODAY    Follow-Up: At [facility], you and your health needs are our priority.  As part of our continuing mission to provide you with exceptional heart care, we have created designated Provider Care Teams.  These Care Teams include your primary Cardiologist (physician) and Advanced Practice Providers (APPs -  Physician Assistants and Nurse Practitioners) who all work together to provide you with the care you need, when you need it.  We recommend signing up for the patient portal called "MyChart".  Sign up information is provided on this After Visit Summary.  MyChart is used to connect with patients for Virtual Visits (Telemedicine).  Patients are able to view lab/test results, encounter notes, upcoming appointments, etc.  Non-urgent messages can be sent to your provider as well.   To learn more about what you can do with MyChart, go to [email or web address].    Your next appointment:  AS SCHEDULED   The format for your next appointment:   In Person  Other Instructions   Important Information About Sugar

## 2022-01-13 ENCOUNTER — Telehealth: Payer: Self-pay

## 2022-01-13 LAB — PRO B NATRIURETIC PEPTIDE: NT-Pro BNP: 313 pg/mL — ABNORMAL HIGH (ref 0–130)

## 2022-01-13 NOTE — Telephone Encounter (Signed)
**Note De-Identified Jaeanna Mccomber Obfuscation** Wilder Glade PA started through covermymeds. Key: [id number]

## 2022-01-17 NOTE — Telephone Encounter (Signed)
**Note De-Identified Shalena Ezzell Obfuscation** Letter received Mary [name] fax from [facility] stating that they have approved the pts Farxiga for coverage until further notice.  I have notified Mary [name], Mary [name] (Ph: [phone]) of this approval.

## 2022-01-19 DIAGNOSIS — Z419 Encounter for procedure for purposes other than remedying health state, unspecified: Secondary | ICD-10-CM | POA: Diagnosis not present

## 2022-02-02 ENCOUNTER — Ambulatory Visit: Payer: Self-pay | Admitting: Nurse Practitioner

## 2022-02-09 ENCOUNTER — Ambulatory Visit: Payer: Medicaid Other | Admitting: Physician Assistant

## 2022-02-09 ENCOUNTER — Encounter: Payer: Self-pay | Admitting: Physician Assistant

## 2022-02-09 VITALS — BP 122/88 | HR 65 | Ht 69.0 in | Wt 346.8 lb

## 2022-02-09 DIAGNOSIS — I10 Essential (primary) hypertension: Secondary | ICD-10-CM | POA: Diagnosis not present

## 2022-02-09 DIAGNOSIS — F339 Major depressive disorder, recurrent, unspecified: Secondary | ICD-10-CM | POA: Diagnosis not present

## 2022-02-09 DIAGNOSIS — I5032 Chronic diastolic (congestive) heart failure: Secondary | ICD-10-CM | POA: Diagnosis not present

## 2022-02-09 NOTE — Patient Instructions (Signed)
Medication Instructions:  Your physician recommends that you continue on your current medications as directed. Please refer to the Current Medication list given to you today.  *If you need a refill on your cardiac medications before your next appointment, please call your pharmacy*   Lab Work: Bmp- today   If you have labs (blood work) drawn today and your tests are completely normal, you will receive your results only by: [place] (if you have MyChart) OR A paper copy in the mail If you have any lab test that is abnormal or we need to change your treatment, we will call you to review the results.   Testing/Procedures: None ordered    Follow-Up: At [facility], you and your health needs are our priority.  As part of our continuing mission to provide you with exceptional heart care, we have created designated Provider Care Teams.  These Care Teams include your primary Cardiologist (physician) and Advanced Practice Providers (APPs -  Physician Assistants and Nurse Practitioners) who all work together to provide you with the care you need, when you need it.  We recommend signing up for the patient portal called "MyChart".  Sign up information is provided on this After Visit Summary.  MyChart is used to connect with patients for Virtual Visits (Telemedicine).  Patients are able to view lab/test results, encounter notes, upcoming appointments, etc.  Non-urgent messages can be sent to your provider as well.   To learn more about what you can do with MyChart, go to [email or web address].    Your next appointment:   12 month(s)  The format for your next appointment:   In Person  Provider:   Dr. Candee Furbish     Other Instructions   Important Information About Sugar

## 2022-02-10 ENCOUNTER — Other Ambulatory Visit: Payer: Medicaid Other

## 2022-02-14 ENCOUNTER — Other Ambulatory Visit: Payer: Self-pay | Admitting: *Deleted

## 2022-02-14 ENCOUNTER — Encounter: Payer: Medicaid Other | Attending: Physical Medicine & Rehabilitation | Admitting: Physical Medicine & Rehabilitation

## 2022-02-14 ENCOUNTER — Encounter: Payer: Self-pay | Admitting: Physical Medicine & Rehabilitation

## 2022-02-14 VITALS — BP 147/90 | HR 72 | Ht 69.0 in | Wt 352.2 lb

## 2022-02-14 DIAGNOSIS — I5032 Chronic diastolic (congestive) heart failure: Secondary | ICD-10-CM

## 2022-02-14 DIAGNOSIS — M542 Cervicalgia: Secondary | ICD-10-CM | POA: Insufficient documentation

## 2022-02-14 DIAGNOSIS — M25471 Effusion, right ankle: Secondary | ICD-10-CM | POA: Diagnosis not present

## 2022-02-14 DIAGNOSIS — M25472 Effusion, left ankle: Secondary | ICD-10-CM | POA: Diagnosis not present

## 2022-02-14 DIAGNOSIS — M545 Low back pain, unspecified: Secondary | ICD-10-CM | POA: Diagnosis not present

## 2022-02-14 MED ORDER — PREGABALIN 75 MG PO CAPS: 75.0000 mg | ORAL_CAPSULE | Freq: Two times a day (BID) | ORAL | 2 refills | Status: DC

## 2022-02-14 NOTE — Progress Notes (Signed)
Subjective:    Patient ID: Mary [name], female    DOB: 06/26/1981, 41 y.o.   MRN: [id number]  HPI 41 year old female with past medical history of migraine, hypertension, COPD, chronic low back pain, depression who is here for an evaluation of her chronic pain.  She reports she has pain in her lower back and neck for more than 10 years.  She reports she also has pain in her feet due to planter fasciitis and pain in her lower legs.  Patient reports numbness in her legs and feet due to neuropathy but she is not able to provide additional history regarding this.  Patient reports she has had this her whole life.  She says she was seen by a pain management clinic in the past where she was prescribed hydrocodone and oxycodone.  She has not had this in several years.  She says her husband passed away from a narcotics overdose. She has been inactive due to her chronic pain.  She had orthopedic surgery on her left elbow in the past. She often has poor sleep. She  says she had physical therapy for her back years ago which did provide some benefit. 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  She reports she was told by her past pain clinic that she has rheumatoid arthritis, she does not follow with rheumatology.    Patient reports she had poor results with gabapentin and duloxetine in the past.  She has not tried Lyrica.  Tramadol did not help her pain.  Patient is currently attempting a weight loss program.  She has chronic depression and is on Celexa.    Patient reports she wants to figure out what is the cause of her pain and how best to treat it. She says she does not necessarily want to restart using opioid medications.  Pain Inventory Average Pain 10 Pain Right Now 10 My pain is constant, sharp, burning, dull, stabbing, tingling, and aching  In the last 24 hours, has pain interfered with the following? General activity 10 Relation with others  10 Enjoyment of life 10 What TIME of day is your pain at its worst? morning , daytime, evening, and night Sleep (in general) Poor  Pain is worse with: walking, bending, standing, and some activites Pain improves with: rest, heat/ice, and medication Relief from Meds: 0  walk without assistance how many minutes can you walk? 5 ability to climb steps?  yes do you drive?  yes  disabled: date disabled . I need assistance with the following:  dressing, bathing, household duties, and shopping  weakness numbness tingling trouble walking spasms dizziness depression anxiety  Any changes since last visit?  no  Any changes since last visit?  no    Family History  Problem Relation Age of Onset   Stroke Mother        TIA   Asthma Mother    Cancer Mother    Other Mother        blood platelets are dropping   Heart attack Father    Hypertension Father    Cancer Father        skin cancer   Stroke Father    Asthma Daughter    Sleep apnea Son    Diabetes Maternal Grandmother    Heart Problems Maternal Grandmother    Anuerysm Maternal Grandfather    Seizures Maternal Grandfather    Cancer Paternal Grandmother  breast   Fibromyalgia Sister    Cancer Sister        cervical   Cancer Sister        cervical   Cancer Brother        thyroid cancer   Cancer Maternal Aunt        breast cancer   Cancer Paternal Aunt    Social History   Socioeconomic History   Marital status: Significant Other    Spouse name: Not on file   Number of children: 3   Years of education: 12   Highest education level: High school graduate  Occupational History   Occupation: Seeking disability  Tobacco Use   Smoking status: Never   Smokeless tobacco: Never  Vaping Use   Vaping Use: Never used  Substance and Sexual Activity   Alcohol use: No   Drug use: Yes    Types: Marijuana   Sexual activity: Not Currently    Birth control/protection: Surgical    Comment: hyst  Other Topics  Concern   Not on file  Social History Narrative   Lives at home with father.   Right-handed.   No daily caffeine per day.   Social Determinants of Health   Financial Resource Strain: High Risk (01/03/2022)   Overall Financial Resource Strain (CARDIA)    Difficulty of Paying Living Expenses: Very hard  Food Insecurity: Food Insecurity Present (01/03/2022)   Hunger Vital Sign    Worried About Running Out of Food in the Last Year: Sometimes true    Ran Out of Food in the Last Year: Sometimes true  Transportation Needs: No Transportation Needs (01/03/2022)   PRAPARE - Hydrologist (Medical): No    Lack of Transportation (Non-Medical): No  Physical Activity: Insufficiently Active (01/03/2022)   Exercise Vital Sign    Days of Exercise per Week: 3 days    Minutes of Exercise per Session: 40 min  Stress: Stress Concern Present (01/03/2022)   [place]    Feeling of Stress : Very much  Social Connections: Moderately Integrated (01/03/2022)   Social Connection and Isolation Panel [NHANES]    Frequency of Communication with Friends and Family: More than three times a week    Frequency of Social Gatherings with Friends and Family: More than three times a week    Attends Religious Services: More than 4 times per year    Active Member of [employer] or Organizations: No    Attends Music therapist: Never    Marital Status: Living with partner   Past Surgical History:  Procedure Laterality Date   ABDOMINAL HYSTERECTOMY     ABDOMINAL HYSTERECTOMY     EYE SURGERY  age 14   Artificial right eye   ORIF ELBOW FRACTURE Left 11/18/2019   Procedure: OPEN REDUCTION WITH REPAIR VERSUS FRAGMENT EXCISION CAPITELLUM FRACTURE LEFT ELBOW AND LIGAMENTOUS RECONSTRUCTION AS NECESSARY;  Surgeon: Roseanne Kaufman, MD;  Location: [facility];  Service: Orthopedics;  Laterality: Left;  2 HRS   Past Medical History:   Diagnosis Date   Allergy    Anxiety    Asthma    Bipolar 1 disorder ([place])    Bulging lumbar disc    BV (bacterial vaginosis) 02/18/2018   Cancer of cervix (HCC)    Carpal tunnel syndrome on right    Cervical cancer (HCC)    Chronic back pain    COPD (chronic obstructive pulmonary disease) ([place])  Depression    Hypertension    Migraines    Physiological ovarian cysts    Plantar fasciitis    PTSD (post-traumatic stress disorder)    Rheumatoid arthritis (HCC)    S/P partial hysterectomy    Vaginal Pap smear, abnormal    BP (!) 147/90   Pulse 72   Ht '5\' 9"'$  (1.753 m)   Wt (!) 352 lb 3.2 oz (159.8 kg)   SpO2 97%   BMI 52.01 kg/m   Opioid Risk Score:   Fall Risk Score:  `1  Depression screen [facility] 2/9     02/14/2022   11:30 AM 01/03/2022   10:30 AM 12/28/2021    9:39 AM 11/15/2021   10:34 AM 08/16/2021   10:07 AM 06/21/2021   10:42 AM 05/24/2021   10:04 AM  Depression screen PHQ 2/9  Decreased Interest '3 2 2 2 1 1 1  '$ Down, Depressed, Hopeless '3 3 3 2 2 1 3  '$ PHQ - 2 Score '6 5 5 4 3 2 4  '$ Altered sleeping '3 3 3 3 1 3 3  '$ Tired, decreased energy '3 2 3 3 1 3 2  '$ Change in appetite '3 3 2 3 '$ 0 2 3  Feeling bad or failure about yourself  '3 3 3 3 '$ 0 3 2  Trouble concentrating '3 3 3 3 2 2 2  '$ Moving slowly or fidgety/restless 0 0 3 2 0 0 3  Suicidal thoughts 1 0 0 0 0 0 0  PHQ-9 Score '22 19 22 21 7 15 19  '$ Difficult doing work/chores   Extremely dIfficult Very difficult Very difficult Somewhat difficult Somewhat difficult    Review of Systems  Constitutional:  Positive for appetite change, chills, diaphoresis, fever and unexpected weight change.  HENT: Negative.    Eyes: Negative.   Respiratory:  Positive for shortness of breath.   Cardiovascular:  Positive for leg swelling.  Gastrointestinal:  Positive for nausea.  Endocrine:       High blood sugar  Genitourinary: Negative.   Musculoskeletal:  Positive for back pain.  Skin: Negative.   Allergic/Immunologic: Negative.    Neurological:  Positive for weakness and numbness.       Tingling  Hematological: Negative.   Psychiatric/Behavioral:  Positive for dysphoric mood. The patient is nervous/anxious.   All other systems reviewed and are negative.     Objective:   Physical Exam  Gen: no distress, normal appearing HEENT: oral mucosa pink and moist, NCAT, prosthetic right eye Cardio: Reg rate Chest: normal effort, normal rate of breathing Abd: soft, non-distended, obese Ext: Edema present in bilateral lower legs Psych: pleasant, flat affect Skin: intact Neuro: Alert and oriented, follows commands, absent sensation reported in bilateral lower extremities below the knees, cranial nerves II through XII grossly intact Strength 5 out of 5 in bilateral upper extremities Strength 5 out of 5 in left lower extremity Strength 4 out of 5 in right lower extremity limited by pain Musculoskeletal: Lumbar paraspinal tenderness Cervical paraspinal tenderness SLR causes pain in the lower back worse on the right Internal and external rotation of the hips resulted in pain in the lower back Decreased lumbar and cervical motion in all directions Surgical scar left elbow with tenderness to palpation  L shoulder 09/05/20 FINDINGS: There is no evidence of fracture or dislocation. There is no evidence of arthropathy or other focal bone abnormality. Soft tissues are unremarkable.   IMPRESSION: Negative.     10/30/19 L spine xray  FINDINGS: Normal lumbar  segmentation. Stable straightening of lumbar lordosis with subtle levoconvex lumbar spine curvature. Subtle anterolisthesis of L4 on L5 is stable. Relatively preserved disc spaces are stable since 2019. No pars fracture. Sacral ala, SI joints and visible lower thoracic levels appear intact. No acute osseous abnormality identified. Negative abdominal visceral contours.   IMPRESSION: 1. No acute osseous abnormality identified in the lumbar spine. 2. Stable mild  degenerative changes since 2019.    Assessment & Plan:  Lower back pain with lumbar spondylosis -Repeat Xray ordered -Lyrica 75 BID ordered -Pt says she will provide records from past pain clinic and prior workup  Pt reported hx of RA?, denies rheumatology following her -RF and CCP ordered -Pt says she was diagnosed by prior pain medicine provider, she will provide records before next visit  B/L LE neuropathy? -Unclear cause, she reports she has had this all her life  Neck pain -C spine xray ordered  Depression and anxiety -She is currently on celexa

## 2022-02-15 LAB — CMP14+EGFR
ALT: 23 IU/L (ref 0–32)
AST: 16 IU/L (ref 0–40)
Albumin/Globulin Ratio: 1.8 (ref 1.2–2.2)
Albumin: 4.9 g/dL — ABNORMAL HIGH (ref 3.8–4.8)
Alkaline Phosphatase: 120 IU/L (ref 44–121)
BUN/Creatinine Ratio: 13 (ref 9–23)
BUN: 13 mg/dL (ref 6–24)
Bilirubin Total: 0.5 mg/dL (ref 0.0–1.2)
CO2: 25 mmol/L (ref 20–29)
Calcium: 9.3 mg/dL (ref 8.7–10.2)
Chloride: 99 mmol/L (ref 96–106)
Creatinine, Ser: 0.97 mg/dL (ref 0.57–1.00)
Globulin, Total: 2.7 g/dL (ref 1.5–4.5)
Glucose: 93 mg/dL (ref 70–99)
Potassium: 4.2 mmol/L (ref 3.5–5.2)
Sodium: 140 mmol/L (ref 134–144)
Total Protein: 7.6 g/dL (ref 6.0–8.5)
eGFR: 75 mL/min/{1.73_m2} (ref >59)

## 2022-02-15 LAB — BASIC METABOLIC PANEL
BUN/Creatinine Ratio: 13 (ref 9–23)
BUN: 14 mg/dL (ref 6–24)
CO2: 24 mmol/L (ref 20–29)
Calcium: 9.6 mg/dL (ref 8.7–10.2)
Chloride: 103 mmol/L (ref 96–106)
Creatinine, Ser: 1.09 mg/dL — ABNORMAL HIGH (ref 0.57–1.00)
Glucose: 96 mg/dL (ref 70–99)
Potassium: 4.9 mmol/L (ref 3.5–5.2)
Sodium: 144 mmol/L (ref 134–144)
eGFR: 65 mL/min/{1.73_m2} (ref >59)

## 2022-02-15 LAB — MICROALBUMIN / CREATININE URINE RATIO
Creatinine, Urine: 133.2 mg/dL
Microalb/Creat Ratio: 3 mg/g creat (ref 0–29)
Microalbumin, Urine: 4.3 ug/mL

## 2022-02-16 ENCOUNTER — Ambulatory Visit: Payer: Medicaid Other | Admitting: Nurse Practitioner

## 2022-02-16 ENCOUNTER — Encounter: Payer: Self-pay | Admitting: Nurse Practitioner

## 2022-02-16 VITALS — BP 143/88 | HR 62 | Temp 97.2°F | Ht 69.0 in | Wt 353.8 lb

## 2022-02-16 DIAGNOSIS — F411 Generalized anxiety disorder: Secondary | ICD-10-CM

## 2022-02-16 DIAGNOSIS — M25471 Effusion, right ankle: Secondary | ICD-10-CM | POA: Diagnosis not present

## 2022-02-16 DIAGNOSIS — M25472 Effusion, left ankle: Secondary | ICD-10-CM | POA: Diagnosis not present

## 2022-02-16 MED ORDER — HYDROXYZINE PAMOATE 25 MG PO CAPS: 25.0000 mg | ORAL_CAPSULE | Freq: Three times a day (TID) | ORAL | 0 refills | Status: DC | PRN

## 2022-02-16 NOTE — Progress Notes (Signed)
Subjective:    Patient ID: Mary [name], female    DOB: 08-Feb-1981, 41 y.o.   MRN: [id number]  HPI  Patient here to discuss health issues.  Patient states that she was seen by cardiology and was a cardiologist believes that her chest pain is related mainly to her anxiety.  Patient states that she is also scheduled for sleep study.  Patient states that her major concerns today deal with anxiety and continued weight loss.  Patient states that her anxiety is high which has been interrupting her sleep.  Patient not interested in restarting Ativan but does states she needs something to help her sleep.  Patient currently on trazodone but its not helping.  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  Patient also states that some of her anxiety and depression comes from grieving the loss of her husband and stepson.  She states that her husband died several years ago and her stepson died last year.  Patient states that the torsemide has been working well for her as far as keeping her fluid down and she is ready to increase Ozempic for increased weight loss.  Review of Systems  Psychiatric/Behavioral:         Anxiety  All other systems reviewed and are negative.      Objective:   Physical Exam Vitals reviewed.  Constitutional:      General: She is not in acute distress.    Appearance: Normal appearance. She is obese. She is not ill-appearing, toxic-appearing or diaphoretic.  HENT:     Head: Normocephalic and atraumatic.  Cardiovascular:     Rate and Rhythm: Normal rate and regular rhythm.     Pulses: Normal pulses.     Heart sounds: Normal heart sounds. No murmur heard. Pulmonary:     Effort: Pulmonary effort is normal. No respiratory distress.     Breath sounds: Normal breath sounds. No wheezing.  Abdominal:     General: Abdomen is flat. There is no  distension.     Palpations: Abdomen is soft. There is no mass.     Tenderness: There is no abdominal tenderness. There is no right CVA tenderness, left CVA tenderness, guarding or rebound.     Hernia: No hernia is present.  Musculoskeletal:     Right lower leg: Edema present.     Left lower leg: Edema present.     Comments: Grossly intact.  Mild swelling noted to patient's bilateral legs.  Skin:    General: Skin is warm.     Capillary Refill: Capillary refill takes less than 2 seconds.  Neurological:     Mental Status: She is alert.     Comments: Grossly intact  Psychiatric:        Mood and Affect: Mood normal.        Behavior: Behavior normal.           Assessment & Plan:   1. GAD (generalized anxiety disorder) -We will trial patient on hydroxyzine 25 mg.  Patient instructed that if 25 mg not helping she may increase to 50 mg. -Patient states that she has trialed multiple SSRIs in several SNRIs all of which have not been helpful. -Patient currently on Celexa which she states is not helping but she is willing to continue with it. - hydrOXYzine (VISTARIL) 25 MG capsule; Take 1 capsule (25 mg total) by mouth every 8 (  eight) hours as needed.  Dispense: 60 capsule; Refill: 0  2.  Morbid obesity -Patient interested to increasing Ozempic to 1.7 mg weekly.  Patient currently taking 1 mg weekly. -Currently do not have records from weight management.  Encourage patient to have records sent to clinic for review prior to starting an increased dose.  3.  Leg swelling -Continue taking torsemide as prescribed  Return to clinic in 8 weeks or sooner if needed    Note:  This document was prepared using Dragon voice recognition software and may include unintentional dictation errors. Note - This record has been created using [employer].  Chart creation errors have been sought, but may not always  have been located. Such creation errors do not reflect on  the standard of medical  care.

## 2022-02-18 DIAGNOSIS — Z419 Encounter for procedure for purposes other than remedying health state, unspecified: Secondary | ICD-10-CM | POA: Diagnosis not present

## 2022-02-23 ENCOUNTER — Emergency Department (HOSPITAL_COMMUNITY)
Admission: EM | Admit: 2022-02-23 | Discharge: 2022-02-24 | Discharge status: Against Medical Advice | Payer: Medicaid Other | Attending: Emergency Medicine | Admitting: Emergency Medicine

## 2022-02-23 DIAGNOSIS — Z5321 Procedure and treatment not carried out due to patient leaving prior to being seen by health care provider: Secondary | ICD-10-CM | POA: Diagnosis not present

## 2022-02-23 DIAGNOSIS — M545 Low back pain, unspecified: Secondary | ICD-10-CM | POA: Diagnosis not present

## 2022-02-24 ENCOUNTER — Other Ambulatory Visit: Payer: Self-pay

## 2022-02-24 ENCOUNTER — Encounter (HOSPITAL_COMMUNITY): Payer: Self-pay

## 2022-02-24 NOTE — ED Triage Notes (Signed)
Pt presents to ED with c/o of lower right sided back pain that started yesterday evening when changing bed sheets, and then back "locked up" per pt.

## 2022-03-09 ENCOUNTER — Encounter: Payer: Self-pay | Admitting: Nurse Practitioner

## 2022-03-09 ENCOUNTER — Ambulatory Visit: Payer: Medicaid Other | Admitting: Nurse Practitioner

## 2022-03-09 DIAGNOSIS — R0602 Shortness of breath: Secondary | ICD-10-CM | POA: Diagnosis not present

## 2022-03-09 DIAGNOSIS — G629 Polyneuropathy, unspecified: Secondary | ICD-10-CM

## 2022-03-09 DIAGNOSIS — G43019 Migraine without aura, intractable, without status migrainosus: Secondary | ICD-10-CM

## 2022-03-09 DIAGNOSIS — W19XXXA Unspecified fall, initial encounter: Secondary | ICD-10-CM | POA: Diagnosis not present

## 2022-03-09 DIAGNOSIS — G4700 Insomnia, unspecified: Secondary | ICD-10-CM

## 2022-03-09 MED ORDER — TRAZODONE HCL 100 MG PO TABS: 200.0000 mg | ORAL_TABLET | Freq: Every day | ORAL | 1 refills | Status: DC
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
## 2022-03-09 NOTE — Patient Instructions (Signed)
Good Afternoon Mary [name], Your Referral to Sleep Medicine has been sent to [place] [facility] . Their Office will call you to schedule your appointment. In the meantime, if you would like to check the status of your Referral you may contact their Office.    [facility] [place]  Address: [place]. [address] [place] Phone Number: [phone]   [facility] you have a [place] Day!  APPT 03/16/2022 at 11:15am

## 2022-03-10 LAB — CMP14+EGFR
ALT: 23 IU/L (ref 0–32)
AST: 16 IU/L (ref 0–40)
Albumin/Globulin Ratio: 1.8 (ref 1.2–2.2)
Albumin: 4.7 g/dL (ref 3.9–4.9)
Alkaline Phosphatase: 119 IU/L (ref 44–121)
BUN/Creatinine Ratio: 10 (ref 9–23)
BUN: 10 mg/dL (ref 6–24)
Bilirubin Total: 0.4 mg/dL (ref 0.0–1.2)
CO2: 26 mmol/L (ref 20–29)
Calcium: 9.7 mg/dL (ref 8.7–10.2)
Chloride: 102 mmol/L (ref 96–106)
Creatinine, Ser: 0.99 mg/dL (ref 0.57–1.00)
Globulin, Total: 2.6 g/dL (ref 1.5–4.5)
Glucose: 110 mg/dL — ABNORMAL HIGH (ref 70–99)
Potassium: 4.3 mmol/L (ref 3.5–5.2)
Sodium: 141 mmol/L (ref 134–144)
Total Protein: 7.3 g/dL (ref 6.0–8.5)
eGFR: 73 mL/min/{1.73_m2} (ref >59)

## 2022-03-10 LAB — CBC WITH DIFFERENTIAL/PLATELET
Basophils Absolute: 0 10*3/uL (ref 0.0–0.2)
Basos: 1 %
EOS (ABSOLUTE): 0.1 10*3/uL (ref 0.0–0.4)
Eos: 2 %
Hematocrit: 38.8 % (ref 34.0–46.6)
Hemoglobin: 13 g/dL (ref 11.1–15.9)
Immature Grans (Abs): 0 10*3/uL (ref 0.0–0.1)
Immature Granulocytes: 0 %
Lymphocytes Absolute: 1.9 10*3/uL (ref 0.7–3.1)
Lymphs: 26 %
MCH: 28.4 pg (ref 26.6–33.0)
MCHC: 33.5 g/dL (ref 31.5–35.7)
MCV: 85 fL (ref 79–97)
Monocytes Absolute: 0.4 10*3/uL (ref 0.1–0.9)
Monocytes: 5 %
Neutrophils Absolute: 4.7 10*3/uL (ref 1.4–7.0)
Neutrophils: 66 %
Platelets: 236 10*3/uL (ref 150–450)
RBC: 4.58 x10E6/uL (ref 3.77–5.28)
RDW: 13.2 % (ref 11.7–15.4)
WBC: 7.2 10*3/uL (ref 3.4–10.8)

## 2022-03-10 LAB — TSH+FREE T4
Free T4: 1.26 ng/dL (ref 0.82–1.77)
TSH: 2.06 u[IU]/mL (ref 0.450–4.500)

## 2022-03-13 DIAGNOSIS — R0602 Shortness of breath: Secondary | ICD-10-CM | POA: Diagnosis not present

## 2022-03-14 LAB — BRAIN NATRIURETIC PEPTIDE: BNP: 5.6 pg/mL (ref 0.0–100.0)

## 2022-03-16 ENCOUNTER — Encounter: Payer: Self-pay | Admitting: Pulmonary Disease

## 2022-03-16 ENCOUNTER — Ambulatory Visit (INDEPENDENT_AMBULATORY_CARE_PROVIDER_SITE_OTHER): Payer: Medicaid Other | Admitting: Pulmonary Disease

## 2022-03-16 ENCOUNTER — Ambulatory Visit (HOSPITAL_COMMUNITY): Admission: RE | Admit: 2022-03-16 | Payer: Medicaid Other | Source: Ambulatory Visit

## 2022-03-16 VITALS — BP 138/92 | HR 68 | Temp 98.4°F | Ht 69.0 in | Wt 359.6 lb

## 2022-03-16 DIAGNOSIS — R0683 Snoring: Secondary | ICD-10-CM

## 2022-03-16 DIAGNOSIS — G4733 Obstructive sleep apnea (adult) (pediatric): Secondary | ICD-10-CM | POA: Diagnosis not present

## 2022-03-16 DIAGNOSIS — R0609 Other forms of dyspnea: Secondary | ICD-10-CM

## 2022-03-16 DIAGNOSIS — I10 Essential (primary) hypertension: Secondary | ICD-10-CM | POA: Diagnosis not present

## 2022-03-16 DIAGNOSIS — G4721 Circadian rhythm sleep disorder, delayed sleep phase type: Secondary | ICD-10-CM | POA: Diagnosis not present

## 2022-03-16 NOTE — Assessment & Plan Note (Signed)
Most likely related to obesity.  No evidence of pulm hypertension on echo.  She is a left and never smoker no reason to suspect obstructive lung disease.  No evidence of ILD on CT coronaries. We will obtain PFTs but clearly weight loss is what we should focus on

## 2022-03-16 NOTE — Progress Notes (Signed)
Subjective:    Patient ID: Mary [name], female    DOB: 1981/03/20, 41 y.o.   MRN: [id number]  HPI  Chief Complaint  Patient presents with   Consult    Consult referred for sleep. Cant relax and witnessed apneas by family members     41 year old morbidly obese woman referred for evaluation of sleep disordered breathing and dyspnea.  She reports shortness of breath on exertion.  She has never formally been diagnosed with asthma.  She is a lifetime never smoker.  No PFTs have been obtained.  She was started on inhalers including Dulera and albuterol and these seem to help a little bit.  She denies wheezing or seasonal allergies.  Cardiac evaluation has been negative including echo 07/2021 showing normal LVEF and calcium score of 0 on CT coronaries  She has been told by her boyfriend and family members that she stops breathing in her sleep.  She reports nonrefreshing sleep.  Loud snoring has been noted and she has occasional gasping episodes that wake her up from sleep.  She is disabled for many years due to bulging disks and chronic pain.  She reports a late bedtime she could go to bed at 9 PM but does not fall asleep until 2 or 3 AM.  She has tried using melatonin 3 mg and trazodone up to 200 mg but these do not help.  She sleeps on her back with 2 pillows, once she falls asleep she is able to sleep through the night with multiple awakenings and gets out of bed around noon.  She had a sleep study in 2014 that did not show OSA. She has gained 80 pounds since then to her current weight of 359 pounds Epworth sleepiness score is 11 she reports sleepiness while sitting and reading, watching TV or lying down to rest in the afternoon  There is no history suggestive of cataplexy, sleep paralysis or parasomnias    Significant tests/ events reviewed  CT coronaries 06/2021 calcium score 0  10/2012 NPSG -264 pounds -no OSA    Past Medical History:  Diagnosis Date   Allergy    Anxiety     Asthma    Bipolar 1 disorder (HCC)    Bulging lumbar disc    BV (bacterial vaginosis) 02/18/2018   Cancer of cervix (HCC)    Carpal tunnel syndrome on right    Cervical cancer (HCC)    Chronic back pain    COPD (chronic obstructive pulmonary disease) (HCC)    Depression    Hypertension    Migraines    Physiological ovarian cysts    Plantar fasciitis    PTSD (post-traumatic stress disorder)    Rheumatoid arthritis (HCC)    S/P partial hysterectomy    Vaginal Pap smear, abnormal    Past Surgical History:  Procedure Laterality Date   ABDOMINAL HYSTERECTOMY     ABDOMINAL HYSTERECTOMY     EYE SURGERY  age 64   Artificial right eye   ORIF ELBOW FRACTURE Left 11/18/2019   Procedure: OPEN REDUCTION WITH REPAIR VERSUS FRAGMENT EXCISION CAPITELLUM FRACTURE LEFT ELBOW AND LIGAMENTOUS RECONSTRUCTION AS NECESSARY;  Surgeon: Roseanne Kaufman, MD;  Location: [facility];  Service: Orthopedics;  Laterality: Left;  2 HRS    Allergies  Allergen Reactions   Buprenorphine Hcl Anaphylaxis    "it stops my heart"   Morphine Anaphylaxis    i quit breathing    Morphine And Related Anaphylaxis    "it stops my heart"  Penicillins Anaphylaxis and Other (See Comments)    Has patient had a PCN reaction causing immediate rash, facial/tongue/throat swelling, SOB or lightheadedness with hypotension: Yes Has patient had a PCN reaction causing severe rash involving mucus membranes or skin necrosis: Yes Has patient had a PCN reaction that required hospitalization: Yes Has patient had a PCN reaction occurring within the last 10 years: No If all of the above answers are "NO", then may proceed with Cephalosporin use.    Gabapentin Other (See Comments)    Per patient "hallucination"   Nsaids    Sulfa Antibiotics     Other reaction(s): Unknown   Ibuprofen-Acetaminophen Nausea And Vomiting   Latex Rash   Tylenol [Acetaminophen] Nausea And Vomiting    Social History   Socioeconomic History   Marital status:  Significant Other    Spouse name: Not on file   Number of children: 3   Years of education: 12   Highest education level: High school graduate  Occupational History   Occupation: Seeking disability  Tobacco Use   Smoking status: Never   Smokeless tobacco: Never  Vaping Use   Vaping Use: Never used  Substance and Sexual Activity   Alcohol use: No   Drug use: Yes    Types: Marijuana   Sexual activity: Not Currently    Birth control/protection: Surgical    Comment: hyst  Other Topics Concern   Not on file  Social History Narrative   Lives at home with father.   Right-handed.   No daily caffeine per day.   Social Determinants of Health   Financial Resource Strain: High Risk (01/03/2022)   Overall Financial Resource Strain (CARDIA)    Difficulty of Paying Living Expenses: Very hard  Food Insecurity: Food Insecurity Present (01/03/2022)   Hunger Vital Sign    Worried About Running Out of Food in the Last Year: Sometimes true    Ran Out of Food in the Last Year: Sometimes true  Transportation Needs: No Transportation Needs (01/03/2022)   PRAPARE - Hydrologist (Medical): No    Lack of Transportation (Non-Medical): No  Physical Activity: Insufficiently Active (01/03/2022)   Exercise Vital Sign    Days of Exercise per Week: 3 days    Minutes of Exercise per Session: 40 min  Stress: Stress Concern Present (01/03/2022)   [place]    Feeling of Stress : Very much  Social Connections: Moderately Integrated (01/03/2022)   Social Connection and Isolation Panel [NHANES]    Frequency of Communication with Friends and Family: More than three times a week    Frequency of Social Gatherings with Friends and Family: More than three times a week    Attends Religious Services: More than 4 times per year    Active Member of [employer] or Organizations: No    Attends Archivist Meetings: Never     Marital Status: Living with partner  Intimate Partner Violence: Not At Risk (01/03/2022)   Humiliation, Afraid, Rape, and Kick questionnaire    Fear of Current or Ex-Partner: No    Emotionally Abused: No    Physically Abused: No    Sexually Abused: No     Family History  Problem Relation Age of Onset   Stroke Mother        TIA   Asthma Mother    Cancer Mother    Other Mother        blood platelets are dropping  Heart attack Father    Hypertension Father    Cancer Father        skin cancer   Stroke Father    Asthma Daughter    Sleep apnea Son    Diabetes Maternal Grandmother    Heart Problems Maternal Grandmother    Anuerysm Maternal Grandfather    Seizures Maternal Grandfather    Cancer Paternal Grandmother        breast   Fibromyalgia Sister    Cancer Sister        cervical   Cancer Sister        cervical   Cancer Brother        thyroid cancer   Cancer Maternal Aunt        breast cancer   Cancer Paternal Aunt      Review of Systems Positive for Shortness of breath with activity Nonproductive cough Chest pain, irregular heartbeats Weight gain Headaches Anxiety and depression Feet swelling Joint stiffness    Objective:   Physical Exam  Gen. Pleasant, obese, in no distress, normal affect ENT - no pallor,icterus, no post nasal drip, class 2-3 airway Neck: No JVD, no thyromegaly, no carotid bruits Lungs: no use of accessory muscles, no dullness to percussion, decreased without rales or rhonchi  Cardiovascular: Rhythm regular, heart sounds  normal, no murmurs or gallops, no peripheral edema Abdomen: soft and non-tender, no hepatosplenomegaly, BS normal. Musculoskeletal: No deformities, no cyanosis or clubbing Neuro:  alert, non focal, no tremors       Assessment & Plan:

## 2022-03-16 NOTE — Assessment & Plan Note (Signed)
She has delayed sleep phase including a late bedtime of 2 or 3 AM and a late wake up time.  This is related to her sedentary lifestyle and disability.  Not sure that this can be fixed. We discussed a strategy of trying to phase shift her slightly earlier, but 30 minutes every week using melatonin to anchor her bedtime and waking up with an alarm clock and light exposure in the morning

## 2022-03-16 NOTE — Assessment & Plan Note (Signed)
Blood pressure was high today and she will follow-up with PCP

## 2022-03-16 NOTE — Patient Instructions (Addendum)
  X  Home sleep test  You have a delayed body clock You can shift by 1/2 hour every week starting with 2.30 am - 11.30 am with alarm clock Take melatonin upto 10 mg & trazodone 2 h before bedtime  X schedule PFTs

## 2022-03-16 NOTE — Assessment & Plan Note (Signed)
Given excessive daytime somnolence, narrow pharyngeal exam, witnessed apneas & loud snoring, obstructive sleep apnea is very likely & an overnight polysomnogram will be scheduled as a home study. The pathophysiology of obstructive sleep apnea , it's cardiovascular consequences & modes of treatment including CPAP were discused with the patient in detail & they evidenced understanding.  Pretest probability is high.  An attended polysomnogram would be difficult due to her delayed sleep phase & her late bedtime

## 2022-03-21 DIAGNOSIS — Z419 Encounter for procedure for purposes other than remedying health state, unspecified: Secondary | ICD-10-CM | POA: Diagnosis not present

## 2022-03-28 ENCOUNTER — Encounter: Payer: Medicaid Other | Attending: Physical Medicine & Rehabilitation | Admitting: Physical Medicine & Rehabilitation

## 2022-03-28 DIAGNOSIS — M545 Low back pain, unspecified: Secondary | ICD-10-CM | POA: Insufficient documentation

## 2022-03-28 DIAGNOSIS — M542 Cervicalgia: Secondary | ICD-10-CM | POA: Insufficient documentation

## 2022-04-10 ENCOUNTER — Other Ambulatory Visit: Payer: Self-pay | Admitting: Nurse Practitioner

## 2022-04-10 DIAGNOSIS — M25472 Effusion, left ankle: Secondary | ICD-10-CM

## 2022-04-18 ENCOUNTER — Ambulatory Visit: Payer: Medicaid Other | Admitting: Nurse Practitioner

## 2022-04-19 ENCOUNTER — Telehealth: Payer: Self-pay | Admitting: Pulmonary Disease

## 2022-04-19 ENCOUNTER — Other Ambulatory Visit: Payer: Self-pay | Admitting: Nurse Practitioner

## 2022-04-19 DIAGNOSIS — F411 Generalized anxiety disorder: Secondary | ICD-10-CM

## 2022-04-19 NOTE — Telephone Encounter (Signed)
Patient called with authorization to get a sleep study done with Dr. Elsworth Soho.  Patient would like a call back to have this appointment scheduled.  Please call patient at [phone].

## 2022-04-21 DIAGNOSIS — Z419 Encounter for procedure for purposes other than remedying health state, unspecified: Secondary | ICD-10-CM | POA: Diagnosis not present

## 2022-04-26 NOTE — Telephone Encounter (Signed)
Hi Chantel Denton Brick is working on [employer] I am adding her to [employer] and will teams her thanks

## 2022-04-26 NOTE — Telephone Encounter (Signed)
Collette will you check on this one

## 2022-05-01 NOTE — Telephone Encounter (Signed)
Scheduled on 05/02/2022

## 2022-05-02 ENCOUNTER — Ambulatory Visit: Payer: Medicaid Other

## 2022-05-02 DIAGNOSIS — G4733 Obstructive sleep apnea (adult) (pediatric): Secondary | ICD-10-CM

## 2022-05-02 DIAGNOSIS — R0683 Snoring: Secondary | ICD-10-CM

## 2022-05-03 ENCOUNTER — Telehealth (HOSPITAL_COMMUNITY): Payer: Self-pay | Admitting: Emergency Medicine

## 2022-05-03 ENCOUNTER — Encounter (HOSPITAL_COMMUNITY): Payer: Self-pay | Admitting: *Deleted

## 2022-05-03 ENCOUNTER — Other Ambulatory Visit: Payer: Self-pay

## 2022-05-03 ENCOUNTER — Emergency Department (HOSPITAL_COMMUNITY): Payer: Medicaid Other

## 2022-05-03 ENCOUNTER — Emergency Department (HOSPITAL_COMMUNITY)
Admission: EM | Admit: 2022-05-03 | Discharge: 2022-05-03 | Disposition: A | Discharge status: Home / Self Care | Payer: Medicaid Other | Attending: Emergency Medicine | Admitting: Emergency Medicine

## 2022-05-03 DIAGNOSIS — Z91040 Latex allergy status: Secondary | ICD-10-CM | POA: Diagnosis not present

## 2022-05-03 DIAGNOSIS — M25562 Pain in left knee: Secondary | ICD-10-CM | POA: Insufficient documentation

## 2022-05-03 MED ORDER — OXYCODONE HCL 5 MG PO TABS: 5.0000 mg | ORAL_TABLET | ORAL | 0 refills | Status: DC | PRN

## 2022-05-03 NOTE — ED Provider Notes (Signed)
[facility] EMERGENCY DEPARTMENT Provider Note   CSN: [id number] Arrival date & time: 05/03/22  1136     History  Chief Complaint  Patient presents with   Knee Pain    Mary [name] is a 41 y.o. female.   Knee Pain Associated symptoms: no fever        Mary [name] is a 41 y.o. female who presents to the Emergency Department complaining of pain to left knee secondary to mechanical fall that occurred 2 weeks ago.  She describes having a frontal impact to her left knee.  Since the fall, she reports pain with attempted weightbearing and with extension of the knee.  She feels a "popping and grinding" in her knee with movement.  She has been applying ice packs without relief.  She denies any numbness or tingling of the extremity, swelling or bruising.   Home Medications Prior to Admission medications   Medication Sig Start Date End Date Taking? Authorizing Provider  albuterol (VENTOLIN HFA) 108 (90 Base) MCG/ACT inhaler Inhale 2 puffs into the lungs every 6 (six) hours as needed for wheezing or shortness of breath. 05/24/21   Baruch Gouty, FNP  Atogepant (QULIPTA) 10 MG TABS Take 10 mg by mouth daily. 05/26/21   Baruch Gouty, FNP  citalopram (CELEXA) 20 MG tablet Take by mouth. 08/10/21   [provider]  dapagliflozin propanediol (FARXIGA) 10 MG TABS tablet Take 1 tablet (10 mg total) by mouth daily before breakfast. 01/12/22   Eileen Stanford, PA-C  DULERA 100-5 MCG/ACT AERO INHALE 2 PUFFS TWICE DAILY 07/29/21   Baruch Gouty, FNP  fluticasone (FLONASE) 50 MCG/ACT nasal spray Place 1 spray into both nostrils daily. 11/15/21   Baruch Gouty, FNP  hydrOXYzine (VISTARIL) 25 MG capsule TAKE 1 CAPSULE EVERY 8 HOURS AS NEEDED 04/21/22   Ameduite, Trenton Gammon, NP  levocetirizine (XYZAL) 5 MG tablet Take 1 tablet (5 mg total) by mouth every evening. 11/15/21   Rakes, Connye Burkitt, FNP  losartan (COZAAR) 50 MG tablet Take 1 tablet (50 mg total) by mouth daily. 05/24/21   Baruch Gouty, FNP  melatonin 3 MG TABS tablet Take 3 mg by mouth at bedtime.    [provider]  ondansetron (ZOFRAN-ODT) 4 MG disintegrating tablet Take 1 tablet (4 mg total) by mouth every 8 (eight) hours as needed for nausea or vomiting. 12/27/21   Ameduite, Trenton Gammon, NP  pregabalin (LYRICA) 75 MG capsule Take 1 capsule (75 mg total) by mouth 2 (two) times daily. 02/14/22   Jennye Boroughs, MD  Semaglutide, 1 MG/DOSE, (OZEMPIC, 1 MG/DOSE,) 4 MG/3ML [facility]  11/15/21   [provider]  spironolactone (ALDACTONE) 25 MG tablet Take 0.5 tablets (12.5 mg total) by mouth daily. 01/12/22 04/12/22  Eileen Stanford, PA-C  torsemide (DEMADEX) 20 MG tablet TAKE ONE TABLET ONCE DAILY 04/10/22   Ameduite, Trenton Gammon, NP  traZODone (DESYREL) 100 MG tablet Take 2 tablets (200 mg total) by mouth at bedtime. 03/09/22   Ameduite, Trenton Gammon, NP      Allergies    Buprenorphine hcl, Morphine, Morphine and related, Penicillins, Gabapentin, Nsaids, Sulfa antibiotics, Ibuprofen-acetaminophen, Latex, and Tylenol [acetaminophen]    Review of Systems   Review of Systems  Constitutional:  Negative for chills and fever.  Respiratory:  Negative for shortness of breath.   Cardiovascular:  Negative for chest pain.  Musculoskeletal:  Positive for arthralgias (Left knee pain) and gait problem. Negative for joint swelling.  Skin:  Negative for color change and wound.  Neurological:  Negative for dizziness, weakness, numbness and headaches.    Physical Exam Updated Vital Signs BP (!) 144/94 (BP Location: Right Wrist)   Pulse 76   Temp 98.1 F (36.7 C) (Oral)   Resp 20   Ht '5\' 9"'$  (1.753 m)   SpO2 97%   BMI 53.10 kg/m  Physical Exam Vitals and nursing note reviewed.  Constitutional:      General: She is not in acute distress.    Appearance: Normal appearance. She is not ill-appearing.  Cardiovascular:     Rate and Rhythm: Normal rate and regular rhythm.     Pulses: Normal pulses.  Pulmonary:     Effort:  Pulmonary effort is normal.  Chest:     Chest wall: No tenderness.  Musculoskeletal:        General: Tenderness and signs of injury present. No swelling.     Left knee: Crepitus present. No swelling, effusion, erythema or ecchymosis. Normal range of motion.     Instability Tests: Anterior drawer test negative.     Comments: Mild discomfort on valgus and varus stress.  Mild patella crepitus noted on range of motion.  No excessive warmth or palpable effusion.  Skin:    General: Skin is warm.     Capillary Refill: Capillary refill takes less than 2 seconds.     Findings: No bruising or rash.  Neurological:     General: No focal deficit present.     Mental Status: She is alert.     Sensory: No sensory deficit.     Motor: No weakness.     ED Results / Procedures / Treatments   Labs (all labs ordered are listed, but only abnormal results are displayed) Labs Reviewed - No data to display  EKG None  Radiology DG Knee Complete 4 Views Left  Result Date: 05/03/2022 CLINICAL DATA:  Fall with knee pain EXAM: LEFT KNEE - COMPLETE 4+ VIEW COMPARISON:  Knee radiographs 03/30/2019 FINDINGS: There is no acute fracture or dislocation. Alignment is normal. The joint spaces are preserved. The soft tissues are unremarkable. There is no effusion. IMPRESSION: Normal knee radiographs. Electronically Signed   By: Valetta Mole M.D.   On: 05/03/2022 12:25    Procedures Procedures    Medications Ordered in ED Medications - No data to display  ED Course/ Medical Decision Making/ A&P                           Medical Decision Making Patient is here for evaluation of left knee pain after mechanical fall that occurred 2 weeks ago.  Continues to have pain with weightbearing.  She has some "grinding and popping of the knee when walking."  On exam, she has diffuse tenderness of the anterior left knee.  No palpable effusion excessive warmth or erythema.  There is some mild tenderness to the medial meniscus  on range of motion.  I do appreciate some patellar crepitus as well.  Differential diagnosis would include but not limited to musculoskeletal injury, fracture, dislocation, ligamentous injury.  Amount and/or Complexity of Data Reviewed Radiology: ordered.    Details: Plain film images of the left knee without evidence of effusion or bony injury. Discussion of management or test interpretation with external provider(s): Patient here with likely musculoskeletal injury of the knee. Knee wrapped with Ace wrap by nursing staff.  Patient agreeable to symptomatic treatment plan, ice, and close orthopedic follow-up  if not improving           Final Clinical Impression(s) / ED Diagnoses Final diagnoses:  Acute pain of left knee    Rx / DC Orders ED Discharge Orders     None         Kem Parkinson, PA-C 05/03/22 1517    Audley Hose, MD 05/04/22 1524

## 2022-05-03 NOTE — ED Triage Notes (Signed)
Pt with left knee pain x 2 weeks.  Pt states she fell about 2 weeks landing on left knee.

## 2022-05-03 NOTE — Telephone Encounter (Signed)
Patient's initial pharmacy was out of stock of this medication.  Prescription sent to [facility] as patient requested.

## 2022-05-03 NOTE — Discharge Instructions (Signed)
Continue to apply ice packs on and off to your knee, I used the Ace wrap as needed for walking or standing.  You may use over-the-counter diclofenac gel as directed to the knee.  This may help with your pain as well.  Please call the orthopedic provider listed to arrange a follow-up appointment if not improving.

## 2022-05-05 ENCOUNTER — Telehealth: Payer: Self-pay | Admitting: Pulmonary Disease

## 2022-05-05 DIAGNOSIS — G4733 Obstructive sleep apnea (adult) (pediatric): Secondary | ICD-10-CM

## 2022-05-05 NOTE — Telephone Encounter (Signed)
HST showed severe  OSA with AHI 58 / hr Suggest autoCPAP  5-20 cm, mask of choice Given severity, she should also schedule a formal titration study in the sleep lab OV with me/APP in 6 wks after starting

## 2022-05-08 NOTE — Telephone Encounter (Signed)
ATC patient.  LMTCB. 

## 2022-05-08 NOTE — Telephone Encounter (Signed)
Called and spoke with patient and her mother (ok per patient) regarding results. They both voiced understanding. Patient is hesitant but agreeable to starting CPAP machine. Did have to clarify for patient that AHI was breathing related and did not mean her heart stopped. She expressed understanding after clarification. Patient was also made aware that we need to see her 6 weeks after starting CPAP for insurance/ guidelines and she voiced understanding and is aware to call when she receives cpap so we can make her an appt.

## 2022-05-11 ENCOUNTER — Ambulatory Visit: Payer: Medicaid Other | Admitting: Orthopedic Surgery

## 2022-05-11 ENCOUNTER — Encounter: Payer: Self-pay | Admitting: Orthopedic Surgery

## 2022-05-11 VITALS — BP 153/104 | HR 74 | Ht 69.0 in | Wt 359.0 lb

## 2022-05-11 DIAGNOSIS — M25562 Pain in left knee: Secondary | ICD-10-CM | POA: Diagnosis not present

## 2022-05-11 DIAGNOSIS — S76112A Strain of left quadriceps muscle, fascia and tendon, initial encounter: Secondary | ICD-10-CM | POA: Diagnosis not present

## 2022-05-11 MED ORDER — CELECOXIB 200 MG PO CAPS: 200.0000 mg | ORAL_CAPSULE | Freq: Two times a day (BID) | ORAL | 0 refills | Status: DC

## 2022-05-11 NOTE — Progress Notes (Signed)
Chief Complaint  Patient presents with   Knee Pain    Lt knee pain, fell onto knee when she got out of the bed 2 wks.    Encounter Diagnoses  Name Primary?   Strain of left quadriceps tendon, initial encounter Yes   Acute pain of left knee    Meds ordered this encounter  Medications   celecoxib (CELEBREX) 200 MG capsule    Sig: Take 1 capsule (200 mg total) by mouth 2 (two) times daily.    Dispense:  60 capsule    Refill:  0    PCN ibuprofen tylenol allergy confirmed   Mary [name] is a 41 y.o. female who presents to the Emergency Department complaining of pain to left knee secondary to mechanical fall that occurred 2 weeks ago.  She describes having a frontal impact to her left knee.  Since the fall, she reports pain with attempted weightbearing and with extension of the knee.  She feels a "popping and grinding" in her knee with movement.  She has been applying ice packs without relief.  She denies any numbness or tingling of the extremity, swelling or bruising.   Mary [name] confirms that she fell.  She says that she was coming out of her house her leg slipped and she fell down.  She landed on a flexed left knee.  She still complains of pain in her quadriceps muscle and anterior knee area.  She has some significant allergies I went through this with her is still unclear what she is actually allergic to but this will limit some of the medications we can give her  Past Medical History:  Diagnosis Date   Allergy    Anxiety    Asthma    Bipolar 1 disorder (HCC)    Bulging lumbar disc    BV (bacterial vaginosis) 02/18/2018   Cancer of cervix ([place])    Carpal tunnel syndrome on right    Cervical cancer (HCC)    Chronic back pain    COPD (chronic obstructive pulmonary disease) (HCC)    Depression    Hypertension    Migraines    Physiological ovarian cysts    Plantar fasciitis    PTSD (post-traumatic stress disorder)    Rheumatoid arthritis (HCC)    S/P partial hysterectomy     Vaginal Pap smear, abnormal    Past Surgical History:  Procedure Laterality Date   ABDOMINAL HYSTERECTOMY     ABDOMINAL HYSTERECTOMY     EYE SURGERY  age 12   Artificial right eye   ORIF ELBOW FRACTURE Left 11/18/2019   Procedure: OPEN REDUCTION WITH REPAIR VERSUS FRAGMENT EXCISION CAPITELLUM FRACTURE LEFT ELBOW AND LIGAMENTOUS RECONSTRUCTION AS NECESSARY;  Surgeon: Roseanne Kaufman, MD;  Location: [place];  Service: Orthopedics;  Laterality: Left;  2 HRS    Review of Systems  Constitutional:  Negative for chills and fever.  Respiratory:  Negative for shortness of breath.   Cardiovascular:  Negative for chest pain.  Musculoskeletal:  Positive for joint pain.  Skin:  Negative for itching and rash.  Neurological:  Negative for tingling.   BP (!) 153/104   Pulse 74   Ht '5\' 9"'$  (1.753 m)   Wt (!) 359 lb (162.8 kg)   BMI 53.02 kg/m    The patient meets the AMA guidelines for Morbid (severe) obesity with a BMI > 40.0 and I have recommended weight loss.  General appearance is within the patient's endomorphic body habitus there are no gross congenital abnormalities.  Grooming is normal hygiene is good.  She is oriented x3  Her mood is pleasant her affect is normal  She is ambulatory without assistive device  The left knee is tender over the quadriceps and patellar region the joint lines are nontender she has full range of motion with some weakness on extension stability tests were normal  Sensation was normal in the lower extremity  Imaging from the hospital shows that she has no fracture or dislocation 4 views of the left knee were obtained my interpretation is that the x-ray is negative alignment was normal no effusion  Quadricep strain would be the most likely diagnosis Encounter Diagnoses  Name Primary?   Strain of left quadriceps tendon, initial encounter Yes   Acute pain of left knee    Recommend physical therapy  She asked for something for pain she is allergic to Tylenol  she says she gets hives from that  Meds ordered this encounter  Medications   celecoxib (CELEBREX) 200 MG capsule    Sig: Take 1 capsule (200 mg total) by mouth 2 (two) times daily.    Dispense:  60 capsule    Refill:  0    PCN ibuprofen tylenol allergy confirmed

## 2022-05-12 ENCOUNTER — Other Ambulatory Visit: Payer: Self-pay | Admitting: Nurse Practitioner

## 2022-05-12 DIAGNOSIS — M25471 Effusion, right ankle: Secondary | ICD-10-CM

## 2022-05-18 ENCOUNTER — Encounter: Payer: Medicaid Other | Admitting: Family Medicine

## 2022-05-21 DIAGNOSIS — Z419 Encounter for procedure for purposes other than remedying health state, unspecified: Secondary | ICD-10-CM | POA: Diagnosis not present

## 2022-06-02 ENCOUNTER — Encounter: Payer: Medicaid Other | Attending: Physical Medicine & Rehabilitation | Admitting: Physical Medicine & Rehabilitation

## 2022-06-02 DIAGNOSIS — M545 Low back pain, unspecified: Secondary | ICD-10-CM | POA: Insufficient documentation

## 2022-06-02 DIAGNOSIS — M542 Cervicalgia: Secondary | ICD-10-CM | POA: Insufficient documentation

## 2022-06-07 DIAGNOSIS — G4733 Obstructive sleep apnea (adult) (pediatric): Secondary | ICD-10-CM | POA: Diagnosis not present

## 2022-06-08 ENCOUNTER — Ambulatory Visit: Payer: Medicaid Other | Admitting: Nurse Practitioner

## 2022-06-08 DIAGNOSIS — F339 Major depressive disorder, recurrent, unspecified: Secondary | ICD-10-CM

## 2022-06-08 DIAGNOSIS — F411 Generalized anxiety disorder: Secondary | ICD-10-CM | POA: Diagnosis not present
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
## 2022-06-09 ENCOUNTER — Ambulatory Visit (HOSPITAL_COMMUNITY): Payer: Medicaid Other

## 2022-06-12 ENCOUNTER — Encounter: Payer: Self-pay | Admitting: Nurse Practitioner

## 2022-06-12 MED ORDER — TIRZEPATIDE 2.5 MG/0.5ML ~~LOC~~ SOAJ: 2.5000 mg | SUBCUTANEOUS | 0 refills | Status: DC

## 2022-06-14 ENCOUNTER — Telehealth: Payer: Self-pay | Admitting: *Deleted

## 2022-06-14 NOTE — Telephone Encounter (Signed)
Insurance does not cover [employer] for weight loss purposes- insurance also stated the medication is also non formulary for diabetes treatment and not covered   Left message to return call to notify patient

## 2022-06-15 NOTE — Telephone Encounter (Signed)
Mychart message sent to patient.

## 2022-06-20 ENCOUNTER — Ambulatory Visit: Payer: Medicaid Other | Admitting: Nurse Practitioner

## 2022-06-20 ENCOUNTER — Encounter: Payer: Self-pay | Admitting: Nurse Practitioner

## 2022-06-20 DIAGNOSIS — R051 Acute cough: Secondary | ICD-10-CM

## 2022-06-20 DIAGNOSIS — J301 Allergic rhinitis due to pollen: Secondary | ICD-10-CM | POA: Diagnosis not present

## 2022-06-20 MED ORDER — LEVOCETIRIZINE DIHYDROCHLORIDE 5 MG PO TABS: 5.0000 mg | ORAL_TABLET | Freq: Every evening | ORAL | 3 refills | Status: DC

## 2022-06-20 NOTE — Progress Notes (Signed)
   Subjective:    Patient ID: Mary [name], female    DOB: Jun 13, 1981, 41 y.o.   MRN: [id number]  Cough This is a new problem. The current episode started today. Associated symptoms include headaches and nasal congestion.   Patient states the symptoms started at 3am this morning. Patient reports a non productive cough, mild headache. Patient denies any fevers, chills, body aches, ear pain, new SOB, chest pain.    Review of Systems  Respiratory:  Positive for cough.   Neurological:  Positive for headaches.       Objective:   Physical Exam Vitals reviewed.  Constitutional:      General: She is not in acute distress.    Appearance: Normal appearance. She is obese. She is not ill-appearing, toxic-appearing or diaphoretic.  HENT:     Head: Normocephalic and atraumatic.     Right Ear: Tympanic membrane, ear canal and external ear normal. There is no impacted cerumen.     Left Ear: Tympanic membrane, ear canal and external ear normal. There is no impacted cerumen.     Nose: Nose normal. No congestion or rhinorrhea.     Mouth/Throat:     Mouth: Mucous membranes are moist.     Pharynx: Oropharynx is clear. No oropharyngeal exudate or posterior oropharyngeal erythema.  Cardiovascular:     Rate and Rhythm: Normal rate and regular rhythm.     Pulses: Normal pulses.     Heart sounds: Normal heart sounds. No murmur heard. Pulmonary:     Effort: Pulmonary effort is normal. No respiratory distress.     Breath sounds: Normal breath sounds. No wheezing.  Musculoskeletal:     Cervical back: Normal range of motion and neck supple. No rigidity or tenderness.     Comments: Grossly intact  Lymphadenopathy:     Cervical: No cervical adenopathy.  Skin:    General: Skin is warm.     Capillary Refill: Capillary refill takes less than 2 seconds.  Neurological:     Mental Status: She is alert.     Comments: Grossly intact  Psychiatric:        Mood and Affect: Mood normal.        Behavior:  Behavior normal.           Assessment & Plan:   1. Cough - Suspect viral etiology - levocetirizine (XYZAL) 5 MG tablet; Take 1 tablet (5 mg total) by mouth every evening.  Dispense: 90 tablet; Refill: 3 - COVID-19, Flu A+B and RSV - May use over the counter antihistamine - Mucinex (no DM) -Use nasal saline sprays - RTC if symptoms worsen or you develop worsening SOB. If SOB severe or markedly changed go to the ED.

## 2022-06-21 DIAGNOSIS — Z419 Encounter for procedure for purposes other than remedying health state, unspecified: Secondary | ICD-10-CM | POA: Diagnosis not present

## 2022-06-22 ENCOUNTER — Ambulatory Visit: Payer: Medicaid Other | Admitting: Orthopedic Surgery

## 2022-06-22 LAB — COVID-19, FLU A+B AND RSV
Influenza A, NAA: NOT DETECTED
Influenza B, NAA: NOT DETECTED
RSV, NAA: NOT DETECTED
SARS-CoV-2, NAA: DETECTED — AB

## 2022-06-22 LAB — SPECIMEN STATUS REPORT

## 2022-06-22 NOTE — Telephone Encounter (Signed)
Patient advised per Barbee Shropshire NP: that an intense review of her medical records and medications were done.  Unfortunately she is not a good candidate for Paxlovid due to multiple drug interactions.  As reminder Paxlovid does not get rid of symptoms it simply prevents potential hospitalization by preventing the virus from replicating.   Most people recover from [place] without any difficulties within 5 to 7 days.   Continue to drink plenty fluid, remain well-nourished, and rest.  If you develop shortness of breath or difficulty breathing please go to the emergency room for further evaluation.  If your symptoms do not improve after 5 to 7 days please return to clinic to be evaluated.   Patient verbalized understanding.

## 2022-06-22 NOTE — Progress Notes (Deleted)
FOLLOW UP   No diagnosis found.   No chief complaint on file.      Mary [name] is 41 years old she is being followed up for strain of her left quadriceps tendon status post fall early September 2023.  She fell when getting out of bed.  Current treatment medication includes Celebrex and we recommended physical therapy

## 2022-06-22 NOTE — Telephone Encounter (Signed)
Please call patient let her know that an intense review of her medical records and medications were done.  Unfortunately she is not a good candidate for Paxlovid due to multiple drug interactions.  As reminder Paxlovid does not get rid of symptoms it simply prevents potential hospitalization by preventing the virus from replicating.  Most people recover from [place] without any difficulties within 5 to 7 days.  Continue to drink plenty fluid, remain well-nourished, and rest.  If you develop shortness of breath or difficulty breathing please go to the emergency room for further evaluation.  If your symptoms do not improve after 5 to 7 days please return to clinic to be evaluated.  Mary [name]

## 2022-06-22 NOTE — Addendum Note (Signed)
Addended by: Dairl Ponder on: 06/22/2022 09:39 AM   Modules accepted: Orders

## 2022-06-22 NOTE — Telephone Encounter (Signed)
Caller name: MARGERITE IMPASTATO  On DPR?: Yes  Call back number: [phone] (mobile)  Provider they see: Coral Spikes, DO  Reason for call:Pt is calling wanting to know if something is going to be called in for her Covid she tested positive for.

## 2022-06-30 ENCOUNTER — Ambulatory Visit: Payer: Medicaid Other | Admitting: Family Medicine

## 2022-07-07 ENCOUNTER — Ambulatory Visit: Payer: Medicaid Other | Admitting: Family Medicine

## 2022-07-07 VITALS — BP 134/88 | HR 74 | Temp 98.2°F | Ht 69.0 in | Wt 353.0 lb

## 2022-07-07 DIAGNOSIS — R059 Cough, unspecified: Secondary | ICD-10-CM

## 2022-07-07 DIAGNOSIS — Z23 Encounter for immunization: Secondary | ICD-10-CM | POA: Diagnosis not present

## 2022-07-07 DIAGNOSIS — F4310 Post-traumatic stress disorder, unspecified: Secondary | ICD-10-CM

## 2022-07-07 MED ORDER — PROMETHAZINE-DM 6.25-15 MG/5ML PO SYRP: 5.0000 mL | ORAL_SOLUTION | Freq: Four times a day (QID) | ORAL | 0 refills | Status: DC | PRN

## 2022-07-07 MED ORDER — CLOTRIMAZOLE-BETAMETHASONE 1-0.05 % EX CREA: 1.0000 | TOPICAL_CREAM | Freq: Every day | CUTANEOUS | 0 refills | Status: DC

## 2022-07-07 NOTE — Patient Instructions (Signed)
Medication as prescribed.  Follow up in 6 months.  Take care  Dr. Lacinda Axon

## 2022-07-08 DIAGNOSIS — G4733 Obstructive sleep apnea (adult) (pediatric): Secondary | ICD-10-CM | POA: Diagnosis not present

## 2022-07-09 DIAGNOSIS — R059 Cough, unspecified: Secondary | ICD-10-CM | POA: Insufficient documentation

## 2022-07-09 NOTE — Progress Notes (Signed)
Subjective:  Patient ID: Mary [name], female    DOB: [birth year]  Age: 41 y.o. MRN: [id number]  CC: Chief Complaint  Patient presents with   Cough    Mostly dry cough worse at night had covid   Obesity    Would like to try zepbound mounjaro Still having back pain and legs     HPI:  41 year old female with the below mentioned medical history presents for evaluation of the above.  Patient reports that she is having ongoing cough.  Worse at night.  Occurred primarily after she had COVID-19.  No fever.  No other associated symptoms.  Patient reported to nursing staff that she would like to discuss obesity medication.  I informed her that her insurance does not cover obesity medications at this time.  She has a current referral into discuss bariatric surgery.  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   COPD (chronic obstructive pulmonary disease) ([place]) 02/03/2014   GAD (generalized anxiety disorder) 02/03/2014   PTSD (post-traumatic stress disorder) 02/03/2014    Social Hx   Social History   Socioeconomic History   Marital status: Significant Other    Spouse name: Not on file   Number of children: 3   Years of education: 12   Highest education level: High school graduate  Occupational History   Occupation: Seeking disability  Tobacco Use   Smoking status: Never   Smokeless tobacco: Never  Vaping Use   Vaping Use: Never used  Substance and Sexual Activity   Alcohol use: No   Drug use: Yes    Types: Marijuana   Sexual activity: Not Currently    Birth  control/protection: Surgical    Comment: hyst  Other Topics Concern   Not on file  Social History Narrative   Lives at home with father.   Right-handed.   No daily caffeine per day.   Social Determinants of Health   Financial Resource Strain: High Risk (01/03/2022)   Overall Financial Resource Strain (CARDIA)    Difficulty of Paying Living Expenses: Very hard  Food Insecurity: Food Insecurity Present (01/03/2022)   Hunger Vital Sign    Worried About Running Out of Food in the Last Year: Sometimes true    Ran Out of Food in the Last Year: Sometimes true  Transportation Needs: No Transportation Needs (01/03/2022)   PRAPARE - Hydrologist (Medical): No    Lack of Transportation (Non-Medical): No  Physical Activity: Insufficiently Active (01/03/2022)   Exercise Vital Sign    Days of Exercise per Week: 3 days    Minutes of Exercise per Session: 40 min  Stress: Stress Concern Present (01/03/2022)   [place]    Feeling of Stress : Very much  Social Connections: Moderately Integrated (01/03/2022)   Social Connection and Isolation Panel [NHANES]    Frequency of Communication with Friends and Family: More than three times a week    Frequency of Social Gatherings with Friends and Family: More than three times a week    Attends Religious Services:  More than 4 times per year    Active Member of Clubs or Organizations: No    Attends Archivist Meetings: Never    Marital Status: Living with partner    Review of Systems Per HPI  Objective:  BP 134/88   Pulse 74   Temp 98.2 F (36.8 C)   Ht '5\' 9"'$  (1.753 m)   Wt (!) 353 lb (160.1 kg)   SpO2 98%   BMI 52.13 kg/m      07/07/2022    2:06 PM 06/20/2022   10:31 AM 06/08/2022    1:23 PM  BP/Weight  Systolic BP 606 301 601  Diastolic BP 88 84 83  Wt. (Lbs) 353 360 360  BMI 52.13 kg/m2 53.16 kg/m2 53.16 kg/m2    Physical  Exam Constitutional:      General: She is not in acute distress.    Appearance: She is obese.  HENT:     Head: Normocephalic and atraumatic.  Cardiovascular:     Rate and Rhythm: Normal rate and regular rhythm.  Pulmonary:     Effort: Pulmonary effort is normal.     Breath sounds: Normal breath sounds. No wheezing or rales.  Neurological:     Mental Status: She is alert.  Psychiatric:        Mood and Affect: Mood normal.        Behavior: Behavior normal.     Lab Results  Component Value Date   WBC 7.2 03/09/2022   HGB 13.0 03/09/2022   HCT 38.8 03/09/2022   PLT 236 03/09/2022   GLUCOSE 110 (H) 03/09/2022   CHOL 160 12/27/2021   TRIG 76 12/27/2021   HDL 51 12/27/2021   LDLCALC 94 12/27/2021   ALT 23 03/09/2022   AST 16 03/09/2022   NA 141 03/09/2022   K 4.3 03/09/2022   CL 102 03/09/2022   CREATININE 0.99 03/09/2022   BUN 10 03/09/2022   CO2 26 03/09/2022   TSH 2.060 03/09/2022   HGBA1C 5.3 12/27/2021     Assessment & Plan:   Problem List Items Addressed This Visit       Other   Cough - Primary    Treating with Promethazine DM.      Morbid obesity ([place])    Awaiting Gen Surgery appt (with bariatric surgeon).        Meds ordered this encounter  Medications   DISCONTD: clotrimazole-betamethasone (LOTRISONE) cream    Sig: Apply 1 Application topically daily.    Dispense:  30 g    Refill:  0   promethazine-dextromethorphan (PROMETHAZINE-DM) 6.25-15 MG/5ML syrup    Sig: Take 5 mLs by mouth 4 (four) times daily as needed.    Dispense:  118 mL    Refill:  0    Follow-up:  Return in about 6 months (around 01/05/2023).  [place]

## 2022-07-09 NOTE — Assessment & Plan Note (Signed)
Treating with Promethazine DM.

## 2022-07-09 NOTE — Assessment & Plan Note (Signed)
Awaiting Gen Surgery appt (with bariatric surgeon).

## 2022-07-11 ENCOUNTER — Other Ambulatory Visit: Payer: Self-pay | Admitting: Physician Assistant

## 2022-07-17 ENCOUNTER — Other Ambulatory Visit: Payer: Self-pay | Admitting: Physical Medicine & Rehabilitation

## 2022-07-17 ENCOUNTER — Other Ambulatory Visit: Payer: Self-pay | Admitting: Family Medicine

## 2022-07-17 ENCOUNTER — Other Ambulatory Visit: Payer: Self-pay | Admitting: Nurse Practitioner

## 2022-07-17 DIAGNOSIS — R110 Nausea: Secondary | ICD-10-CM

## 2022-07-17 DIAGNOSIS — M25472 Effusion, left ankle: Secondary | ICD-10-CM

## 2022-07-17 DIAGNOSIS — J410 Simple chronic bronchitis: Secondary | ICD-10-CM

## 2022-07-17 NOTE — Telephone Encounter (Signed)
PMP was Reviewed.  Dr Marciano Sequin note was reviewed.  Lyrica e-scribed to pharmacy.

## 2022-07-18 ENCOUNTER — Other Ambulatory Visit: Payer: Self-pay | Admitting: Family Medicine

## 2022-07-18 DIAGNOSIS — J410 Simple chronic bronchitis: Secondary | ICD-10-CM

## 2022-07-18 MED ORDER — ALBUTEROL SULFATE HFA 108 (90 BASE) MCG/ACT IN AERS: 2.0000 | INHALATION_SPRAY | Freq: Four times a day (QID) | RESPIRATORY_TRACT | 5 refills | Status: AC | PRN

## 2022-07-21 DIAGNOSIS — Z419 Encounter for procedure for purposes other than remedying health state, unspecified: Secondary | ICD-10-CM | POA: Diagnosis not present

## 2022-08-04 DIAGNOSIS — G4733 Obstructive sleep apnea (adult) (pediatric): Secondary | ICD-10-CM | POA: Diagnosis not present

## 2022-08-04 DIAGNOSIS — Z6841 Body Mass Index (BMI) 40.0 and over, adult: Secondary | ICD-10-CM | POA: Diagnosis not present

## 2022-08-04 DIAGNOSIS — I10 Essential (primary) hypertension: Secondary | ICD-10-CM | POA: Diagnosis not present

## 2022-08-04 DIAGNOSIS — Z8541 Personal history of malignant neoplasm of cervix uteri: Secondary | ICD-10-CM | POA: Diagnosis not present

## 2022-08-04 DIAGNOSIS — E559 Vitamin D deficiency, unspecified: Secondary | ICD-10-CM | POA: Diagnosis not present

## 2022-08-04 DIAGNOSIS — F339 Major depressive disorder, recurrent, unspecified: Secondary | ICD-10-CM | POA: Diagnosis not present

## 2022-08-04 DIAGNOSIS — G43909 Migraine, unspecified, not intractable, without status migrainosus: Secondary | ICD-10-CM | POA: Diagnosis not present

## 2022-08-04 DIAGNOSIS — G8929 Other chronic pain: Secondary | ICD-10-CM | POA: Diagnosis not present

## 2022-08-04 DIAGNOSIS — J45909 Unspecified asthma, uncomplicated: Secondary | ICD-10-CM | POA: Diagnosis not present

## 2022-08-04 DIAGNOSIS — I5032 Chronic diastolic (congestive) heart failure: Secondary | ICD-10-CM | POA: Diagnosis not present

## 2022-08-07 ENCOUNTER — Encounter (HOSPITAL_COMMUNITY): Payer: Self-pay | Admitting: Emergency Medicine

## 2022-08-07 ENCOUNTER — Other Ambulatory Visit: Payer: Self-pay

## 2022-08-07 ENCOUNTER — Emergency Department (HOSPITAL_COMMUNITY)
Admission: EM | Admit: 2022-08-07 | Discharge: 2022-08-07 | Disposition: A | Discharge status: Home / Self Care | Payer: Medicaid Other | Attending: Emergency Medicine | Admitting: Emergency Medicine

## 2022-08-07 ENCOUNTER — Emergency Department (HOSPITAL_COMMUNITY): Payer: Medicaid Other

## 2022-08-07 DIAGNOSIS — J449 Chronic obstructive pulmonary disease, unspecified: Secondary | ICD-10-CM | POA: Diagnosis not present

## 2022-08-07 DIAGNOSIS — J45909 Unspecified asthma, uncomplicated: Secondary | ICD-10-CM | POA: Insufficient documentation

## 2022-08-07 DIAGNOSIS — Z7951 Long term (current) use of inhaled steroids: Secondary | ICD-10-CM | POA: Insufficient documentation

## 2022-08-07 DIAGNOSIS — Z90710 Acquired absence of both cervix and uterus: Secondary | ICD-10-CM | POA: Diagnosis not present

## 2022-08-07 DIAGNOSIS — Z79899 Other long term (current) drug therapy: Secondary | ICD-10-CM | POA: Insufficient documentation

## 2022-08-07 DIAGNOSIS — K5901 Slow transit constipation: Secondary | ICD-10-CM | POA: Diagnosis not present

## 2022-08-07 DIAGNOSIS — I10 Essential (primary) hypertension: Secondary | ICD-10-CM | POA: Diagnosis not present

## 2022-08-07 DIAGNOSIS — G4733 Obstructive sleep apnea (adult) (pediatric): Secondary | ICD-10-CM | POA: Diagnosis not present

## 2022-08-07 DIAGNOSIS — K5900 Constipation, unspecified: Secondary | ICD-10-CM | POA: Diagnosis not present

## 2022-08-07 LAB — CBC
HCT: 39.3 % (ref 36.0–46.0)
Hemoglobin: 12.9 g/dL (ref 12.0–15.0)
MCH: 27.9 pg (ref 26.0–34.0)
MCHC: 32.8 g/dL (ref 30.0–36.0)
MCV: 84.9 fL (ref 80.0–100.0)
Platelets: 209 10*3/uL (ref 150–400)
RBC: 4.63 MIL/uL (ref 3.87–5.11)
RDW: 14.1 % (ref 11.5–15.5)
WBC: 6.6 10*3/uL (ref 4.0–10.5)
nRBC: 0 % (ref 0.0–0.2)

## 2022-08-07 LAB — BASIC METABOLIC PANEL
Anion gap: 9 (ref 5–15)
BUN: 11 mg/dL (ref 6–20)
CO2: 26 mmol/L (ref 22–32)
Calcium: 9.1 mg/dL (ref 8.9–10.3)
Chloride: 105 mmol/L (ref 98–111)
Creatinine, Ser: 1.07 mg/dL — ABNORMAL HIGH (ref 0.44–1.00)
GFR, Estimated: >60 mL/min (ref >60)
Glucose, Bld: 98 mg/dL (ref 70–99)
Potassium: 3.5 mmol/L (ref 3.5–5.1)
Sodium: 140 mmol/L (ref 135–145)

## 2022-08-07 MED ORDER — POLYETHYLENE GLYCOL 3350 17 G PO PACK: 17.0000 g | PACK | Freq: Every day | ORAL | 0 refills | Status: DC

## 2022-08-07 MED ORDER — IOHEXOL 300 MG/ML  SOLN
100.0000 mL | Freq: Once | INTRAMUSCULAR | Status: AC | PRN
  Administered 2022-08-07: 100 mL via INTRAVENOUS

## 2022-08-07 NOTE — ED Provider Notes (Signed)
[facility] EMERGENCY DEPARTMENT Provider Note   CSN: [id number] Arrival date & time: 08/07/22  0449     History  Chief Complaint  Patient presents with   Constipation    Mary [name] is a 41 y.o. female.  The history is provided by the patient.  Constipation Severity:  Moderate Time since last bowel movement:  2 weeks Timing:  Constant Progression:  Worsening Chronicity:  New Relieved by:  Nothing  Patient with history of obesity, hypertension presents with constipation.  She reports no bowel movements for the past 2 weeks.  She has used suppositories without relief.  She is not taking any oral meds for constipation.  No abdominal pain.  No vomiting.  She reports now every time she tries to have a bowel movement she passes blood.  She is not on anticoagulation.  No vaginal bleeding.  No dysuria. Previous surgery includes hysterectomy   Past Medical History:  Diagnosis Date   Allergy    Anxiety    Asthma    Bipolar 1 disorder (HCC)    Bulging lumbar disc    BV (bacterial vaginosis) 02/18/2018   Cancer of cervix (HCC)    Carpal tunnel syndrome on right    Cervical cancer (HCC)    Chronic back pain    COPD (chronic obstructive pulmonary disease) (HCC)    Depression    Hypertension    Migraines    Physiological ovarian cysts    Plantar fasciitis    PTSD (post-traumatic stress disorder)    Rheumatoid arthritis (HCC)    S/P partial hysterectomy    Vaginal Pap smear, abnormal     Home Medications Prior to Admission medications   Medication Sig Start Date End Date Taking? Authorizing Provider  polyethylene glycol (MIRALAX / GLYCOLAX) 17 g packet Take 17 g by mouth daily. 08/07/22  Yes Ripley Fraise, MD  albuterol (VENTOLIN HFA) 108 (90 Base) MCG/ACT inhaler Inhale 2 puffs into the lungs every 6 (six) hours as needed for wheezing or shortness of breath. 07/18/22   Cook, Barnie Del, DO  Atogepant (QULIPTA) 10 MG TABS Take 10 mg by mouth daily. 05/26/21   Baruch Gouty, FNP  celecoxib (CELEBREX) 200 MG capsule Take 1 capsule (200 mg total) by mouth 2 (two) times daily. 05/11/22   Carole Civil, MD  citalopram (CELEXA) 20 MG tablet Take by mouth. 08/10/21   [provider]  dapagliflozin propanediol (FARXIGA) 10 MG TABS tablet Take 1 tablet (10 mg total) by mouth daily before breakfast. 01/12/22   Eileen Stanford, PA-C  DULERA 100-5 MCG/ACT AERO INHALE 2 PUFFS TWICE DAILY 07/29/21   Baruch Gouty, FNP  fluticasone (FLONASE) 50 MCG/ACT nasal spray Place 1 spray into both nostrils daily. 11/15/21   Baruch Gouty, FNP  hydrOXYzine (VISTARIL) 25 MG capsule TAKE 1 CAPSULE EVERY 8 HOURS AS NEEDED 04/21/22   Ameduite, Trenton Gammon, FNP  levocetirizine (XYZAL) 5 MG tablet Take 1 tablet (5 mg total) by mouth every evening. 06/20/22   Ameduite, Trenton Gammon, FNP  losartan (COZAAR) 50 MG tablet Take 1 tablet (50 mg total) by mouth daily. 05/24/21   Baruch Gouty, FNP  melatonin 3 MG TABS tablet Take 3 mg by mouth at bedtime.    [provider]  ondansetron (ZOFRAN-ODT) 4 MG disintegrating tablet TAKE 1 TABLET EVERY 8 HOURS AS NEEDED FOR NAUSEA OR VOMITING 07/19/22   Cook, Jayce G, DO  pregabalin (LYRICA) 75 MG capsule TAKE ONE CAPSULE TWICE DAILY  07/17/22   Bayard Hugger, NP  promethazine-dextromethorphan (PROMETHAZINE-DM) 6.25-15 MG/5ML syrup Take 5 mLs by mouth 4 (four) times daily as needed. 07/07/22   Coral Spikes, DO  tirzepatide [facility]) 2.5 MG/0.5ML Pen Inject 2.5 mg into the skin once a week. Patient not taking: Reported on 07/07/2022 06/12/22   Ameduite, Trenton Gammon, FNP  torsemide (DEMADEX) 20 MG tablet TAKE ONE TABLET ONCE DAILY 07/19/22   Coral Spikes, DO  traZODone (DESYREL) 100 MG tablet Take 2 tablets (200 mg total) by mouth at bedtime. 03/09/22   Ameduite, Trenton Gammon, FNP      Allergies    Buprenorphine hcl, Morphine, Morphine and related, Penicillins, Gabapentin, Nsaids, Sulfa antibiotics, Ibuprofen-acetaminophen, Latex, and  Tylenol [acetaminophen]    Review of Systems   Review of Systems  Gastrointestinal:  Positive for constipation.    Physical Exam Updated Vital Signs BP (!) 157/95 (BP Location: Left Wrist)   Pulse 79   Temp 97.9 F (36.6 C) (Oral)   Resp 18   Ht 1.753 m ('5\' 9"'$ )   Wt (!) 157.9 kg   SpO2 95%   BMI 51.39 kg/m  Physical Exam CONSTITUTIONAL: Chronically ill-appearing, appears older than stated age HEAD: Normocephalic/atraumatic ENMT: Mucous membranes moist NECK: supple no meningeal signs CV: S1/S2 noted, no murmurs/rubs/gallops noted LUNGS: Lungs are clear to auscultation bilaterally, no apparent distress ABDOMEN: soft, nontender, no rebound or guarding, bowel sounds noted throughout abdomen, obese Rectal-exam performed with nurse Ayesha Rumpf present Brown stool is noted.  No maroon blood or melena No obvious stool impaction, patient has significant tenderness on rectal exam.  Exam limited due to body size GU:no cva tenderness NEURO: Pt is awake/alert/appropriate, moves all extremitiesx4.  No facial droop.   EXTREMITIES:  full ROM SKIN: warm, color normal PSYCH: no abnormalities of mood noted, alert and oriented to situation  ED Results / Procedures / Treatments   Labs (all labs ordered are listed, but only abnormal results are displayed) Labs Reviewed  BASIC METABOLIC PANEL - Abnormal; Notable for the following components:      Result Value   Creatinine, Ser 1.07 (*)    All other components within normal limits  CBC    EKG None  Radiology CT PELVIS W CONTRAST  Result Date: 08/07/2022 CLINICAL DATA:  Evaluate for perianal abscess or fistula. Complains of constipation for 2 weeks. EXAM: CT PELVIS WITH CONTRAST TECHNIQUE: Multidetector CT imaging of the pelvis was performed using the standard protocol following the bolus administration of intravenous contrast. RADIATION DOSE REDUCTION: This exam was performed according to the departmental dose-optimization program which  includes automated exposure control, adjustment of the mA and/or kV according to patient size and/or use of iterative reconstruction technique. CONTRAST:  134m OMNIPAQUE IOHEXOL 300 MG/ML  SOLN COMPARISON:  12/13/2015. FINDINGS: Urinary Tract:  Urinary bladder appears normal. Bowel: No dilated bowel loops. There is a moderate volume of desiccated stool identified within the rectum, image 34/2. No bowel wall thickening or inflammation. No signs of perianal abscess or fistula formation. Vascular/Lymphatic: No pathologically enlarged lymph nodes. No significant vascular abnormality seen. Reproductive:  Hysterectomy.  No adnexal mass identified. Other:  No free fluid or fluid collections identified. Musculoskeletal: No suspicious bone lesions identified. IMPRESSION: 1. No acute findings within the pelvis. No signs of perianal abscess or fistula formation. 2. Moderate volume of desiccated stool identified within the rectum. Correlate for any clinical signs or symptoms of constipation. Electronically Signed   By: TKerby MoorsM.D.   On: 08/07/2022 07:01  Procedures Procedures    Medications Ordered in ED Medications  iohexol (OMNIPAQUE) 300 MG/ML solution 100 mL (100 mLs Intravenous Contrast Given 08/07/22 6387)    ED Course/ Medical Decision Making/ A&P Clinical Course as of 08/07/22 0711  Mon Aug 07, 2022  0535 Patient without abdominal pain, but is reporting constipation and had significant tenderness on rectal exam.  Hemoccult positive, but this is likely due to straining and hemorrhoids, low suspicion for acute GI bleed.  Due to tenderness on rectal exam and limitations due to her body size, will obtain CT pelvis to evaluate for any perirectal abscess [DW]  0710 CT scan reveals constipation.  No signs of any perirectal/perianal abscess.  Patient has no other acute complaints.  Plan for discharge home.  She would like to start MiraLAX.  She reports very little success with enema at home recently.   Prescription for MiraLAX is been given.  She will be discharged home [DW]    Clinical Course User Index [DW] Ripley Fraise, MD                           Medical Decision Making Amount and/or Complexity of Data Reviewed Labs: ordered. Radiology: ordered.  Risk Prescription drug management.   This patient presents to the ED for concern of constipation and rectal pain, this involves an extensive number of treatment options, and is a complaint that carries with it a high risk of complications and morbidity.  The differential diagnosis includes but is not limited to stool impaction, thrombosed hemorrhoid, perirectal abscess, anal fissure  Comorbidities that complicate the patient evaluation: Patient's presentation is complicated by their history of obesity hypertension   Additional history obtained: Records reviewed Primary Care Documents  Lab Tests: I Ordered, and personally interpreted labs.  The pertinent results include: Labs unremarkable  Imaging Studies ordered: I ordered imaging studies including CT scan pelvis   I independently visualized and interpreted imaging which showed constipation I agree with the radiologist interpretation  Medicines ordered and prescription drug management: Patient declined medications   Reevaluation: After the interventions noted above, I reevaluated the patient and found that they have :improved  Complexity of problems addressed: Patient's presentation is most consistent with  acute presentation with potential threat to life or bodily function  Disposition: After consideration of the diagnostic results and the patient's response to treatment,  I feel that the patent would benefit from discharge   .           Final Clinical Impression(s) / ED Diagnoses Final diagnoses:  Slow transit constipation    Rx / DC Orders ED Discharge Orders          Ordered    polyethylene glycol (MIRALAX / GLYCOLAX) 17 g packet  Daily         08/07/22 0708              Ripley Fraise, MD 08/07/22 [phone]

## 2022-08-07 NOTE — ED Triage Notes (Signed)
Pt with c/o constipation x 2 weeks. Pt states she has only tried stool softners.

## 2022-08-09 ENCOUNTER — Telehealth: Payer: Self-pay | Admitting: *Deleted

## 2022-08-09 ENCOUNTER — Encounter: Payer: Self-pay | Admitting: *Deleted

## 2022-08-09 NOTE — Patient Outreach (Signed)
  Care Coordination [facility] 2 Note Transition Care Management Follow-up Telephone Call Date of discharge and from where: 08/07/22 from [facility] How have you been since you were released from the hospital? "I am doing better, but still tired." Any questions or concerns? Yes Patient concerned that she continues to be tired and weak  Items Reviewed: Did the pt receive and understand the discharge instructions provided? Yes  Medications obtained and verified? Yes  Other? Yes  Any new allergies since your discharge? No  Dietary orders reviewed? Yes Do you have support at home? Yes   Home Care and Equipment/Supplies: Were home health services ordered? no If so, what is the name of the agency? N/A  Has the agency set up a time to come to the patient's home? not applicable Were any new equipment or medical supplies ordered?  No What is the name of the medical supply agency? N/A Were you able to get the supplies/equipment? not applicable Do you have any questions related to the use of the equipment or supplies? No  Functional Questionnaire: (I = Independent and D = Dependent) ADLs: I  Bathing/Dressing- I  Meal Prep- I  Eating- I  Maintaining continence- I  Transferring/Ambulation- I  Managing Meds- I  Follow up appointments reviewed:  PCP Hospital f/u appt confirmed?  N/A ED visit . RNCM advised patient to schedule follow up with PCP [facility] f/u appt confirmed?  N/A ED visit   Are transportation arrangements needed? Yes  If their condition worsens, is the pt aware to call PCP or go to the Emergency Dept.? Yes Was the patient provided with contact information for the PCP's office or ED? No Was to pt encouraged to call back with questions or concerns? Yes  RNCM provided patient with information for Case Management. Patient consents to CM services with MM team. Scheduled on 08/28/22 @ 9am for a telephone outreach.  Lurena Joiner RN, BSN [place]  [employer] Engineer, maintenance (IT)

## 2022-08-21 DIAGNOSIS — Z419 Encounter for procedure for purposes other than remedying health state, unspecified: Secondary | ICD-10-CM | POA: Diagnosis not present

## 2022-08-25 ENCOUNTER — Other Ambulatory Visit: Payer: Self-pay | Admitting: Family Medicine

## 2022-08-25 DIAGNOSIS — M25471 Effusion, right ankle: Secondary | ICD-10-CM

## 2022-08-25 DIAGNOSIS — G4700 Insomnia, unspecified: Secondary | ICD-10-CM

## 2022-08-25 DIAGNOSIS — R110 Nausea: Secondary | ICD-10-CM

## 2022-08-25 DIAGNOSIS — F411 Generalized anxiety disorder: Secondary | ICD-10-CM

## 2022-08-28 ENCOUNTER — Encounter: Payer: Self-pay | Admitting: *Deleted

## 2022-08-28 ENCOUNTER — Other Ambulatory Visit: Payer: Medicaid Other | Admitting: *Deleted

## 2022-08-28 NOTE — Patient Outreach (Signed)
Medicaid Managed Care   Nurse Care Manager Note  08/28/2022 Name:  Mary [name] MRN:  [id number] DOB:  [birth year]  Mary [name] is an 42 y.o. year old female who is a primary patient of Coral Spikes, DO.  The [facility] Managed Care Coordination team was consulted for assistance with:    COPD Weight loss  Mary [name] was given information about Medicaid Managed Care Coordination team services today. Mary [name] Patient agreed to services and verbal consent obtained.  Engaged with patient by telephone for initial visit in response to provider referral for case management and/or care coordination services.   Assessments/Interventions:  Review of past medical history, allergies, medications, health status, including review of consultants reports, laboratory and other test data, was performed as part of comprehensive evaluation and provision of chronic care management services.  SDOH (Social Determinants of Health) assessments and interventions performed: SDOH Interventions    Flowsheet Row Patient Outreach Telephone from 08/28/2022 in [name] Telephone from 08/09/2022 in [place] Office Visit from 06/08/2022 in [place] Visit from 03/09/2022 in [place] Visit from 02/14/2022 in [facility] [facility] Office Visit from 12/27/2021 in [place] Interventions        Food Insecurity Interventions Other (Comment)  [BSW referral for local food pantries] -- -- -- -- --  Housing Interventions Intervention Not Indicated -- -- -- -- --  Transportation Interventions Intervention Not Indicated Intervention Not Indicated -- -- -- --  Utilities Interventions Intervention Not Indicated -- -- -- -- --  Depression Interventions/Treatment  -- -- Medication, Referral to Psychiatry Currently on Treatment Medication, Currently on Treatment  Currently on Treatment, Referral to Psychiatry       Care Plan  Allergies  Allergen Reactions   Buprenorphine Hcl Anaphylaxis    "it stops my heart"   Morphine Anaphylaxis    i quit breathing    Morphine And Related Anaphylaxis    "it stops my heart"   Penicillins Anaphylaxis and Other (See Comments)    Has patient had a PCN reaction causing immediate rash, facial/tongue/throat swelling, SOB or lightheadedness with hypotension: Yes Has patient had a PCN reaction causing severe rash involving mucus membranes or skin necrosis: Yes Has patient had a PCN reaction that required hospitalization: Yes Has patient had a PCN reaction occurring within the last 10 years: No If all of the above answers are "NO", then may proceed with Cephalosporin use.    Gabapentin Other (See Comments)    Per patient "hallucination"   Nsaids    Sulfa Antibiotics     Other reaction(s): Unknown   Ibuprofen-Acetaminophen Nausea And Vomiting   Latex Rash   Tylenol [Acetaminophen] Nausea And Vomiting    Medications Reviewed Today     Reviewed by Melissa Montane, RN (Registered Nurse) on 08/28/22 at [phone]  Med List Status: <None>   Medication Order Taking? Sig Documenting Provider Last Dose Status Informant  albuterol (VENTOLIN HFA) 108 (90 Base) MCG/ACT inhaler [id number] Yes Inhale 2 puffs into the lungs every 6 (six) hours as needed for wheezing or shortness of breath. Coral Spikes, DO Taking Active   Atogepant (QULIPTA) 10 MG TABS [id number] Yes Take 10 mg by mouth daily. Baruch Gouty, FNP Taking Active   celecoxib (CELEBREX) 200 MG capsule [id number] Yes Take 1 capsule (200 mg total) by mouth 2 (two) times daily. Carole Civil, MD Taking Active  Cholecalciferol (VITAMIN D3) 1000 units CAPS [id number] Yes Take 3,000 Units by mouth daily. [provider] Taking Active   citalopram (CELEXA) 20 MG tablet [id number] Yes Take by mouth. [provider] Taking Active   dapagliflozin  propanediol (FARXIGA) 10 MG TABS tablet [id number] Yes Take 1 tablet (10 mg total) by mouth daily before breakfast. Eileen Stanford, PA-C Taking Active   DULERA 100-5 MCG/ACT AERO [id number] Yes INHALE 2 PUFFS TWICE DAILY Rakes, Connye Burkitt, FNP Taking Active   fluticasone (FLONASE) 50 MCG/ACT nasal spray [id number] Yes Place 1 spray into both nostrils daily. Baruch Gouty, FNP Taking Active   furosemide (LASIX) 20 MG tablet [id number] Yes Take 20 mg by mouth. [provider] Taking Active   hydrOXYzine (VISTARIL) 25 MG capsule [id number] Yes TAKE 1 CAPSULE EVERY 8 HOURS AS NEEDED Lacinda Axon, Jayce G, DO Taking Active   levocetirizine (XYZAL) 5 MG tablet [id number] Yes Take 1 tablet (5 mg total) by mouth every evening. Ameduite, Trenton Gammon, FNP Taking Active   losartan (COZAAR) 50 MG tablet [id number] Yes Take 1 tablet (50 mg total) by mouth daily. Baruch Gouty, FNP Taking Active   melatonin 3 MG TABS tablet [id number] Yes Take 10 mg by mouth at bedtime. [provider] Taking Active   omeprazole (PRILOSEC) 40 MG capsule [id number] Yes Take 40 mg by mouth daily. [provider] Taking Active   ondansetron (ZOFRAN-ODT) 4 MG disintegrating tablet [id number] Yes TAKE 1 TABLET EVERY 8 HOURS AS NEEDED FOR NAUSEA OR VOMITING Cook, Jayce G, DO Taking Active   polyethylene glycol (MIRALAX / GLYCOLAX) 17 g packet [id number] Yes Take 17 g by mouth daily. Ripley Fraise, MD Taking Active   pregabalin (LYRICA) 75 MG capsule [id number] Yes TAKE ONE CAPSULE TWICE DAILY Bayard Hugger, NP Taking Active   promethazine-dextromethorphan (PROMETHAZINE-DM) 6.25-15 MG/5ML syrup [id number] Yes Take 5 mLs by mouth 4 (four) times daily as needed. Coral Spikes, DO Taking Active   spironolactone (ALDACTONE) 25 MG tablet [id number] Yes Take 12.5 mg by mouth daily. [provider] Taking Active   tirzepatide [facility]) 2.5 MG/0.5ML Pen [id number] No Inject 2.5 mg into the skin once a week.  Patient not  taking: Reported on 07/07/2022   Ameduite, Trenton Gammon, FNP Not Taking Active   torsemide (DEMADEX) 20 MG tablet [id number] Yes TAKE ONE TABLET ONCE DAILY Coral Spikes, DO Taking Active   traZODone (DESYREL) 100 MG tablet [id number] Yes TAKE TWO TABLETS AT BEDTIME Coral Spikes, DO Taking Active             Patient Active Problem List   Diagnosis Date Noted   Cough 07/09/2022   OSA (obstructive sleep apnea) 03/16/2022   Chronic pain syndrome 05/24/2021   High risk medication use 05/24/2021   Allergic rhinitis 05/24/2021   Morbid obesity ([place]) 06/11/2020   Primary hypertension 06/11/2020   Migraine headache without aura 12/02/2019   History of cervical cancer 02/06/2018   Depression, recurrent ([place]) 02/06/2018   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   COPD (chronic obstructive pulmonary disease) ([place]) 02/03/2014   GAD (generalized anxiety disorder) 02/03/2014   PTSD (post-traumatic stress disorder) 02/03/2014    Conditions to be addressed/monitored per PCP order:  COPD and weight loss  Care Plan : RN Care Manager Plan of Care  Updates made by Melissa Montane, RN since 08/28/2022 12:00 AM     Problem: Health Management needs related to Weight loss and COPD      Long-Range Goal: Development  of Plan of Car to address Health Management needs related to Weight loss and COPD   Start Date: 08/28/2022  Expected End Date: 11/26/2022  Note:   Current Barriers:  Chronic Disease Management support and education needs related to COPD and Weight Loss  RNCM Clinical Goal(s):  Patient will verbalize understanding of plan for management of COPD and weight loss as evidenced by patient reports take all medications exactly as prescribed and will call provider for medication related questions as evidenced by patient reports    attend all scheduled medical appointments: schedule follow up with Pulmonology, 09/26/22 with Bariatric Surgeon and 09/27/22 with Nurse Navigator as evidenced by provider documentation         work with Education officer, museum to address Financial constraints related to utilities, Limited access to food, and applying for disability related to the management of COPD and weight loss as evidenced by review of EMR and patient or Education officer, museum report     through collaboration with Consulting civil engineer, provider, and care team.   Interventions: Inter-disciplinary care team collaboration (see longitudinal plan of care) Evaluation of current treatment plan related to  self management and patient's adherence to plan as established by provider BSW referral for assistance with local food pantries, dental providers, utility resources and applying for disability   COPD: (Status: New goal.) Long Term Goal  Reviewed medications with patient, including use of prescribed maintenance and rescue inhalers, and provided instruction on medication management and the importance of adherence Provided patient with basic written and verbal COPD education on self care/management/and exacerbation prevention Advised patient to self assesses COPD action plan zone and make appointment with provider if in the yellow zone for 48 hours without improvement Advised patient to engage in light exercise as tolerated 3-5 days a week to aid in the the management of COPD Discussed the importance of adequate rest and management of fatigue with COPD Assessed social determinant of health barriers Advised patient to follow up with Pulmonology and discuss any questions or concerns Provided education on COPD in video form via MyChart  Weight Loss:  (Status: New goal.)  Advised patient to discuss with primary care provider options regarding weight management;  Provided verbal and/or written education to patient re: provider recommended life style modifications;  Reviewed recommended dietary changes: avoid fad diets, make small/incremental dietary and exercise changes, eat at the table and avoid eating in front of the TV, plan management of  cravings, monitor snacking and cravings in food diary; Assessed social determinant of health barriers;   Patient Goals/Self-Care Activities: Take medications as prescribed   Attend all scheduled provider appointments Call provider office for new concerns or questions  Work with the social worker to address care coordination needs and will continue to work with the clinical team to address health care and disease management related needs       Follow Up:  Patient agrees to Care Plan and Follow-up.  Plan: The Managed Medicaid care management team will reach out to the patient again over the next 30 days.  Date/time of next scheduled RN care management/care coordination outreach:  09/27/22 @ 2:30pm  Lurena Joiner RN, BSN [place] RN Care Coordinator

## 2022-08-28 NOTE — Patient Instructions (Signed)
Visit Information  Ms. Flannery was given information about Medicaid Managed Care team care coordination services as a part of their [facility] Medicaid benefit. [name] verbally consented to engagement with the [facility] Managed Care team.   If you are experiencing a medical emergency, please call 911 or report to your local emergency department or urgent care.   If you have a non-emergency medical problem during routine business hours, please contact your provider's office and ask to speak with a nurse.   For questions related to your [facility] health plan, please call: [phone] or go here:[email or web address]  If you would like to schedule transportation through your [facility] plan, please call the following number at least 2 days in advance of your appointment: [phone].  You can also use the MTM portal or MTM mobile app to manage your rides. For the portal, please go to [email or web address].  Call the [place] at [phone], at any time, 24 hours a day, 7 days a week. If you are in danger or need immediate medical attention call 911.  If you would like help to quit smoking, call 1-800-QUIT-NOW 504-188-6491) OR Espaol: 1-855-Djelo-Ya (1-245-809-9833) o para ms informacin haga clic aqu or Text READY to 200-400 to register via text  Ms. Blough,   Please see education materials related to COPD provided by MyChart link.  Patient verbalizes understanding of instructions and care plan provided today and agrees to view in [place]. Active MyChart status and patient understanding of how to access instructions and care plan via MyChart confirmed with patient.     Telephone follow up appointment with Managed Medicaid care management team member scheduled for:09/27/22 @ 2:30pm  Lurena Joiner RN, BSN [place] RN Care Coordinator   Following is a copy of your plan of care:  Care Plan : RN Care  Manager Plan of Care  Updates made by Melissa Montane, RN since 08/28/2022 12:00 AM     Problem: Health Management needs related to Weight loss and COPD      Long-Range Goal: Development of Plan of Car to address Health Management needs related to Weight loss and COPD   Start Date: 08/28/2022  Expected End Date: 11/26/2022  Note:   Current Barriers:  Chronic Disease Management support and education needs related to COPD and Weight Loss  RNCM Clinical Goal(s):  Patient will verbalize understanding of plan for management of COPD and weight loss as evidenced by patient reports take all medications exactly as prescribed and will call provider for medication related questions as evidenced by patient reports    attend all scheduled medical appointments: schedule follow up with Pulmonology, 09/26/22 with Bariatric Surgeon and 09/27/22 with Nurse Navigator as evidenced by provider documentation        work with Education officer, museum to address Financial constraints related to utilities, Limited access to food, and applying for disability related to the management of COPD and weight loss as evidenced by review of EMR and patient or Education officer, museum report     through collaboration with Consulting civil engineer, provider, and care team.   Interventions: Inter-disciplinary care team collaboration (see longitudinal plan of care) Evaluation of current treatment plan related to  self management and patient's adherence to plan as established by provider BSW referral for assistance with local food pantries, dental providers, utility resources and applying for disability   COPD: (Status: New goal.) Long Term Goal  Reviewed medications with patient, including use of prescribed maintenance and rescue  inhalers, and provided instruction on medication management and the importance of adherence Provided patient with basic written and verbal COPD education on self care/management/and exacerbation prevention Advised patient to self assesses  COPD action plan zone and make appointment with provider if in the yellow zone for 48 hours without improvement Advised patient to engage in light exercise as tolerated 3-5 days a week to aid in the the management of COPD Discussed the importance of adequate rest and management of fatigue with COPD Assessed social determinant of health barriers Advised patient to follow up with Pulmonology and discuss any questions or concerns Provided education on COPD in video form via MyChart  Weight Loss:  (Status: New goal.)  Advised patient to discuss with primary care provider options regarding weight management;  Provided verbal and/or written education to patient re: provider recommended life style modifications;  Reviewed recommended dietary changes: avoid fad diets, make small/incremental dietary and exercise changes, eat at the table and avoid eating in front of the TV, plan management of cravings, monitor snacking and cravings in food diary; Assessed social determinant of health barriers;   Patient Goals/Self-Care Activities: Take medications as prescribed   Attend all scheduled provider appointments Call provider office for new concerns or questions  Work with the social worker to address care coordination needs and will continue to work with the clinical team to address health care and disease management related needs

## 2022-08-29 ENCOUNTER — Other Ambulatory Visit: Payer: Medicaid Other

## 2022-08-29 NOTE — Patient Outreach (Signed)
Medicaid Managed Care Social Work Note  08/29/2022 Name:  Mary [name] MRN:  [id number] DOB:  [birth year]  Mary [name] is an 42 y.o. year old female who is a primary patient of Coral Spikes, DO.  The Medicaid Managed Care Coordination team was consulted for assistance with:  Community Resources   Mary [name] was given information about Medicaid Managed Care Coordination team services today. Mary [name] Patient agreed to services and verbal consent obtained.  Engaged with patient  for by telephone forinitial visit in response to referral for case management and/or care coordination services.   Assessments/Interventions:  Review of past medical history, allergies, medications, health status, including review of consultants reports, laboratory and other test data, was performed as part of comprehensive evaluation and provision of chronic care management services.  SDOH: (Social Determinant of Health) assessments and interventions performed: SDOH Interventions    Flowsheet Row Patient Outreach Telephone from 08/28/2022 in [place] Telephone from 08/09/2022 in [place] Office Visit from 06/08/2022 in [place] Visit from 03/09/2022 in [place] Visit from 02/14/2022 in [place] Visit from 12/27/2021 in [place] Interventions        Food Insecurity Interventions Other (Comment)  [BSW referral for local food pantries] -- -- -- -- --  Housing Interventions Intervention Not Indicated -- -- -- -- --  Transportation Interventions Intervention Not Indicated Intervention Not Indicated -- -- -- --  Utilities Interventions Intervention Not Indicated -- -- -- -- --  Depression Interventions/Treatment  -- -- Medication, Referral to Psychiatry Currently on Treatment Medication, Currently on Treatment Currently on  Treatment, Referral to Psychiatry     BSW completed a telephone outreach with patient for food pantry resources,patient is currently receiving foodstamps but it is not enough for the whole month. Patient states she has 2 bariatric classess that her insurance will not pay for in [place], [place] will do some research to see if there are any classess that will accept her Medicaid. Patient is also wanting some dental resources that will accept her Medicaid, she is okay with going to another county. Patient states she lost her leg when she was 1 and is wanting to apply for disability, BSW encouraged patient to start the process online and contact legal aid for further assistance. BSW will mail resources to patient  Advanced Directives Status:  Not addressed in this encounter.  Care Plan                 Allergies  Allergen Reactions   Buprenorphine Hcl Anaphylaxis    "it stops my heart"   Morphine Anaphylaxis    i quit breathing    Morphine And Related Anaphylaxis    "it stops my heart"   Penicillins Anaphylaxis and Other (See Comments)    Has patient had a PCN reaction causing immediate rash, facial/tongue/throat swelling, SOB or lightheadedness with hypotension: Yes Has patient had a PCN reaction causing severe rash involving mucus membranes or skin necrosis: Yes Has patient had a PCN reaction that required hospitalization: Yes Has patient had a PCN reaction occurring within the last 10 years: No If all of the above answers are "NO", then may proceed with Cephalosporin use.    Gabapentin Other (See Comments)    Per patient "hallucination"   Nsaids    Sulfa Antibiotics     Other reaction(s): Unknown   Ibuprofen-Acetaminophen Nausea And Vomiting  Latex Rash   Tylenol [Acetaminophen] Nausea And Vomiting    Medications Reviewed Today     Reviewed by Melissa Montane, RN (Registered Nurse) on 08/28/22 at [phone]  Med List Status: <None>   Medication Order Taking? Sig Documenting Provider  Last Dose Status Informant  albuterol (VENTOLIN HFA) 108 (90 Base) MCG/ACT inhaler [id number] Yes Inhale 2 puffs into the lungs every 6 (six) hours as needed for wheezing or shortness of breath. Coral Spikes, DO Taking Active   Atogepant (QULIPTA) 10 MG TABS [id number] Yes Take 10 mg by mouth daily. Baruch Gouty, FNP Taking Active   celecoxib (CELEBREX) 200 MG capsule [id number] Yes Take 1 capsule (200 mg total) by mouth 2 (two) times daily. Carole Civil, MD Taking Active   Cholecalciferol (VITAMIN D3) 1000 units CAPS [id number] Yes Take 3,000 Units by mouth daily. [provider] Taking Active   citalopram (CELEXA) 20 MG tablet [id number] Yes Take by mouth. [provider] Taking Active   dapagliflozin propanediol (FARXIGA) 10 MG TABS tablet [id number] Yes Take 1 tablet (10 mg total) by mouth daily before breakfast. Eileen Stanford, PA-C Taking Active   DULERA 100-5 MCG/ACT AERO [id number] Yes INHALE 2 PUFFS TWICE DAILY Rakes, Connye Burkitt, FNP Taking Active   fluticasone (FLONASE) 50 MCG/ACT nasal spray [id number] Yes Place 1 spray into both nostrils daily. Baruch Gouty, FNP Taking Active   furosemide (LASIX) 20 MG tablet [id number] Yes Take 20 mg by mouth. [provider] Taking Active   hydrOXYzine (VISTARIL) 25 MG capsule [id number] Yes TAKE 1 CAPSULE EVERY 8 HOURS AS NEEDED Lacinda Axon, Jayce G, DO Taking Active   levocetirizine (XYZAL) 5 MG tablet [id number] Yes Take 1 tablet (5 mg total) by mouth every evening. Ameduite, Trenton Gammon, FNP Taking Active   losartan (COZAAR) 50 MG tablet [id number] Yes Take 1 tablet (50 mg total) by mouth daily. Baruch Gouty, FNP Taking Active   melatonin 3 MG TABS tablet [id number] Yes Take 10 mg by mouth at bedtime. [provider] Taking Active   omeprazole (PRILOSEC) 40 MG capsule [id number] Yes Take 40 mg by mouth daily. [provider] Taking Active   ondansetron (ZOFRAN-ODT) 4 MG disintegrating tablet [id number] Yes TAKE 1  TABLET EVERY 8 HOURS AS NEEDED FOR NAUSEA OR VOMITING Cook, Jayce G, DO Taking Active   polyethylene glycol (MIRALAX / GLYCOLAX) 17 g packet [id number] Yes Take 17 g by mouth daily. Ripley Fraise, MD Taking Active   pregabalin (LYRICA) 75 MG capsule [id number] Yes TAKE ONE CAPSULE TWICE DAILY Bayard Hugger, NP Taking Active   promethazine-dextromethorphan (PROMETHAZINE-DM) 6.25-15 MG/5ML syrup [id number] Yes Take 5 mLs by mouth 4 (four) times daily as needed. Coral Spikes, DO Taking Active   spironolactone (ALDACTONE) 25 MG tablet [id number] Yes Take 12.5 mg by mouth daily. [provider] Taking Active   tirzepatide [facility]) 2.5 MG/0.5ML Pen [id number] No Inject 2.5 mg into the skin once a week.  Patient not taking: Reported on 07/07/2022   Ameduite, Trenton Gammon, FNP Not Taking Active   torsemide (DEMADEX) 20 MG tablet [id number] Yes TAKE ONE TABLET ONCE DAILY Coral Spikes, DO Taking Active   traZODone (DESYREL) 100 MG tablet [id number] Yes TAKE TWO TABLETS AT BEDTIME Coral Spikes, DO Taking Active             Patient Active Problem List   Diagnosis Date Noted   Cough 07/09/2022   OSA (obstructive sleep apnea) 03/16/2022   Chronic pain  syndrome 05/24/2021   High risk medication use 05/24/2021   Allergic rhinitis 05/24/2021   Morbid obesity ([place]) 06/11/2020   Primary hypertension 06/11/2020   Migraine headache without aura 12/02/2019   History of cervical cancer 02/06/2018   Depression, recurrent ([place]) 02/06/2018   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   COPD (chronic obstructive pulmonary disease) ([place]) 02/03/2014   GAD (generalized anxiety disorder) 02/03/2014   PTSD (post-traumatic stress disorder) 02/03/2014    Conditions to be addressed/monitored per PCP order:   community resources  Care Plan : [place] of Care  Updates made by Ethelda Chick since 08/29/2022 12:00 AM     Problem: Health Management needs related to Weight loss and COPD      Long-Range  Goal: Development of Plan of Car to address Health Management needs related to Weight loss and COPD   Start Date: 08/28/2022  Expected End Date: 11/26/2022  Note:   Current Barriers:  Chronic Disease Management support and education needs related to COPD and Weight Loss  RNCM Clinical Goal(s):  Patient will verbalize understanding of plan for management of COPD and weight loss as evidenced by patient reports take all medications exactly as prescribed and will call provider for medication related questions as evidenced by patient reports    attend all scheduled medical appointments: schedule follow up with Pulmonology, 09/26/22 with Bariatric Surgeon and 09/27/22 with Nurse Navigator as evidenced by provider documentation        work with Education officer, museum to address Financial constraints related to utilities, Limited access to food, and applying for disability related to the management of COPD and weight loss as evidenced by review of EMR and patient or Education officer, museum report     through collaboration with Consulting civil engineer, provider, and care team.   Interventions: Inter-disciplinary care team collaboration (see longitudinal plan of care) Evaluation of current treatment plan related to  self management and patient's adherence to plan as established by provider BSW referral for assistance with local food pantries, dental providers, utility resources and applying for disability BSW completed a telephone outreach with patient for food pantry resources,patient is currently receiving foodstamps but it is not enough for the whole month. Patient states she has 2 bariatric classess that her insurance will not pay for in [place], [place] will do some research to see if there are any classess that will accept her Medicaid. Patient is also wanting some dental resources that will accept her Medicaid, she is okay with going to another county. Patient states she lost her leg when she was 1 and is wanting to apply for  disability, BSW encouraged patient to start the process online and contact legal aid for further assistance. BSW will mail resources to patient.   COPD: (Status: New goal.) Long Term Goal  Reviewed medications with patient, including use of prescribed maintenance and rescue inhalers, and provided instruction on medication management and the importance of adherence Provided patient with basic written and verbal COPD education on self care/management/and exacerbation prevention Advised patient to self assesses COPD action plan zone and make appointment with provider if in the yellow zone for 48 hours without improvement Advised patient to engage in light exercise as tolerated 3-5 days a week to aid in the the management of COPD Discussed the importance of adequate rest and management of fatigue with COPD Assessed social determinant of health barriers Advised patient to follow up with Pulmonology and discuss any questions or concerns Provided education on COPD in video form  via MyChart  Weight Loss:  (Status: New goal.)  Advised patient to discuss with primary care provider options regarding weight management;  Provided verbal and/or written education to patient re: provider recommended life style modifications;  Reviewed recommended dietary changes: avoid fad diets, make small/incremental dietary and exercise changes, eat at the table and avoid eating in front of the TV, plan management of cravings, monitor snacking and cravings in food diary; Assessed social determinant of health barriers;   Patient Goals/Self-Care Activities: Take medications as prescribed   Attend all scheduled provider appointments Call provider office for new concerns or questions  Work with the social worker to address care coordination needs and will continue to work with the clinical team to address health care and disease management related needs       Follow up:  Patient agrees to Care Plan and  Follow-up.  Plan: The Managed Medicaid care management team will reach out to the patient again over the next 14 days.  Date/time of next scheduled Social Work care management/care coordination outreach:  09/18/22  Mickel Fuchs, Arita Miss, [place] Medicaid Team  [phone]

## 2022-08-29 NOTE — Patient Instructions (Signed)
Visit Information  Ms. Peeler was given information about Medicaid Managed Care team care coordination services as a part of their [facility] Medicaid benefit. [name] verbally consented to engagement with the [facility] Managed Care team.   If you are experiencing a medical emergency, please call 911 or report to your local emergency department or urgent care.   If you have a non-emergency medical problem during routine business hours, please contact your provider's office and ask to speak with a nurse.   For questions related to your [facility] health plan, please call: [phone] or go here:[email or web address]  If you would like to schedule transportation through your [facility] plan, please call the following number at least 2 days in advance of your appointment: [phone].  You can also use the MTM portal or MTM mobile app to manage your rides. For the portal, please go to [email or web address].  Call the [place] at [phone], at any time, 24 hours a day, 7 days a week. If you are in danger or need immediate medical attention call 911.  If you would like help to quit smoking, call 1-800-QUIT-NOW 8013070284) OR Espaol: 1-855-Djelo-Ya (4-132-440-1027) o para ms informacin haga clic aqu or Text READY to 200-400 to register via text  Ms. Tillery - following are the goals we discussed in your visit today:   Goals Addressed   None      Social Worker will follow up in 14 days.   Mickel Fuchs, BSW, [place]  High Risk Managed Medicaid Team  [phone]   Following is a copy of your plan of care:  Care Plan : [place] of Care  Updates made by Ethelda Chick since 08/29/2022 12:00 AM     Problem: Health Management needs related to Weight loss and COPD      Long-Range Goal: Development of Plan of Car to address Health Management needs related to Weight loss and COPD    Start Date: 08/28/2022  Expected End Date: 11/26/2022  Note:   Current Barriers:  Chronic Disease Management support and education needs related to COPD and Weight Loss  RNCM Clinical Goal(s):  Patient will verbalize understanding of plan for management of COPD and weight loss as evidenced by patient reports take all medications exactly as prescribed and will call provider for medication related questions as evidenced by patient reports    attend all scheduled medical appointments: schedule follow up with Pulmonology, 09/26/22 with Bariatric Surgeon and 09/27/22 with Nurse Navigator as evidenced by provider documentation        work with Education officer, museum to address Financial constraints related to utilities, Limited access to food, and applying for disability related to the management of COPD and weight loss as evidenced by review of EMR and patient or Education officer, museum report     through collaboration with Consulting civil engineer, provider, and care team.   Interventions: Inter-disciplinary care team collaboration (see longitudinal plan of care) Evaluation of current treatment plan related to  self management and patient's adherence to plan as established by provider BSW referral for assistance with local food pantries, dental providers, utility resources and applying for disability BSW completed a telephone outreach with patient for food pantry resources,patient is currently receiving foodstamps but it is not enough for the whole month. Patient states she has 2 bariatric classess that her insurance will not pay for in [place], [place] will do some research to see if there are any classess  that will accept her Medicaid. Patient is also wanting some dental resources that will accept her Medicaid, she is okay with going to another county. Patient states she lost her leg when she was 1 and is wanting to apply for disability, BSW encouraged patient to start the process online and contact legal aid for further assistance.  BSW will mail resources to patient.   COPD: (Status: New goal.) Long Term Goal  Reviewed medications with patient, including use of prescribed maintenance and rescue inhalers, and provided instruction on medication management and the importance of adherence Provided patient with basic written and verbal COPD education on self care/management/and exacerbation prevention Advised patient to self assesses COPD action plan zone and make appointment with provider if in the yellow zone for 48 hours without improvement Advised patient to engage in light exercise as tolerated 3-5 days a week to aid in the the management of COPD Discussed the importance of adequate rest and management of fatigue with COPD Assessed social determinant of health barriers Advised patient to follow up with Pulmonology and discuss any questions or concerns Provided education on COPD in video form via MyChart  Weight Loss:  (Status: New goal.)  Advised patient to discuss with primary care provider options regarding weight management;  Provided verbal and/or written education to patient re: provider recommended life style modifications;  Reviewed recommended dietary changes: avoid fad diets, make small/incremental dietary and exercise changes, eat at the table and avoid eating in front of the TV, plan management of cravings, monitor snacking and cravings in food diary; Assessed social determinant of health barriers;   Patient Goals/Self-Care Activities: Take medications as prescribed   Attend all scheduled provider appointments Call provider office for new concerns or questions  Work with the social worker to address care coordination needs and will continue to work with the clinical team to address health care and disease management related needs

## 2022-08-30 ENCOUNTER — Telehealth: Payer: Self-pay

## 2022-08-30 NOTE — Telephone Encounter (Signed)
**Note De-Identified Reshunda Strider Obfuscation** Wilder Glade PA started through covermymeds. Key: [id number]

## 2022-09-07 DIAGNOSIS — G4733 Obstructive sleep apnea (adult) (pediatric): Secondary | ICD-10-CM | POA: Diagnosis not present

## 2022-09-07 NOTE — Telephone Encounter (Signed)
**Note De-Identified [name] Obfuscation** Per letter received from [facility] [place] fax, the pts Mary [name] has been approved for coverage until 08/30/2023. ID: [id number]  I have notified [place], [place] (Ph: [phone]) of this approval.

## 2022-09-21 DIAGNOSIS — Z419 Encounter for procedure for purposes other than remedying health state, unspecified: Secondary | ICD-10-CM | POA: Diagnosis not present

## 2022-09-25 DIAGNOSIS — G43909 Migraine, unspecified, not intractable, without status migrainosus: Secondary | ICD-10-CM | POA: Diagnosis not present

## 2022-09-25 DIAGNOSIS — G4733 Obstructive sleep apnea (adult) (pediatric): Secondary | ICD-10-CM | POA: Diagnosis not present

## 2022-09-25 DIAGNOSIS — F319 Bipolar disorder, unspecified: Secondary | ICD-10-CM | POA: Diagnosis not present

## 2022-09-25 DIAGNOSIS — I10 Essential (primary) hypertension: Secondary | ICD-10-CM | POA: Diagnosis not present

## 2022-09-25 DIAGNOSIS — I5032 Chronic diastolic (congestive) heart failure: Secondary | ICD-10-CM | POA: Diagnosis not present

## 2022-09-25 DIAGNOSIS — J449 Chronic obstructive pulmonary disease, unspecified: Secondary | ICD-10-CM | POA: Diagnosis not present

## 2022-09-27 ENCOUNTER — Other Ambulatory Visit: Payer: Medicaid Other | Admitting: *Deleted

## 2022-09-27 ENCOUNTER — Encounter: Payer: Self-pay | Admitting: *Deleted

## 2022-09-27 NOTE — Patient Instructions (Signed)
Visit Information  Ms. Magouirk was given information about Medicaid Managed Care team care coordination services as a part of their [facility] Medicaid benefit. [name] verbally consented to engagement with the [facility] Managed Care team.   If you are experiencing a medical emergency, please call 911 or report to your local emergency department or urgent care.   If you have a non-emergency medical problem during routine business hours, please contact your provider's office and ask to speak with a nurse.   For questions related to your [facility] health plan, please call: [phone] or go here:[email or web address]  If you would like to schedule transportation through your [facility] plan, please call the following number at least 2 days in advance of your appointment: [phone].  You can also use the MTM portal or MTM mobile app to manage your rides. For the portal, please go to [email or web address].  Call the [place] at [phone], at any time, 24 hours a day, 7 days a week. If you are in danger or need immediate medical attention call 911.  If you would like help to quit smoking, call 1-800-QUIT-NOW (818)704-9080) OR Espaol: 1-855-Djelo-Ya (7-902-409-7353) o para ms informacin haga clic aqu or Text READY to 200-400 to register via text  Ms. Garlitz,   Please see education materials related to stress provided by MyChart link.  Patient verbalizes understanding of instructions and care plan provided today and agrees to view in [place]. Active MyChart status and patient understanding of how to access instructions and care plan via MyChart confirmed with patient.     Telephone follow up appointment with Managed Medicaid care management team member scheduled for:10/27/22 @ 10:30am  Lurena Joiner RN, BSN [place] RN Care Coordinator   Following is a copy of your plan of care:  Care Plan : RN Care  Manager Plan of Care  Updates made by Melissa Montane, RN since 09/27/2022 12:00 AM     Problem: Health Management needs related to Weight loss and COPD      Long-Range Goal: Development of Plan of Car to address Health Management needs related to Weight loss and COPD   Start Date: 08/28/2022  Expected End Date: 11/26/2022  Note:   Current Barriers:  Chronic Disease Management support and education needs related to COPD and Weight Loss Patient is in good spirits today. She has lost #2 and working at improving her diet and exercising daily. She is working with a Tourist information centre manager with [employer], who is helping her with disability and getting Medicare and Medicaid. She is attending appointments with Bariatrics, but would like to lose weight without having surgery.  RNCM Clinical Goal(s):  Patient will verbalize understanding of plan for management of COPD and weight loss as evidenced by patient reports take all medications exactly as prescribed and will call provider for medication related questions as evidenced by patient reports    attend all scheduled medical appointments: schedule follow up with Pulmonology, 10/10/22 with Nutrition as evidenced by provider documentation        work with Education officer, museum to address Financial constraints related to utilities, Limited access to food, and applying for disability related to the management of COPD and weight loss as evidenced by review of EMR and patient or Education officer, museum report     through collaboration with Consulting civil engineer, provider, and care team.   Interventions: Inter-disciplinary care team collaboration (see longitudinal plan of care) Evaluation of current treatment plan related to  self management and patient's adherence to plan as established by provider Referral to LCSW for mental health needs and managing stress, scheduled on 10/06/22 Reviewed provider notes and discussed with patient    COPD: (Status: Goal on Track (progressing): YES.) Long  Term Goal  Reviewed medications with patient, including use of prescribed maintenance and rescue inhalers, and provided instruction on medication management and the importance of adherence Advised patient to engage in light exercise as tolerated 3-5 days a week to aid in the the management of COPD Provided education about and advised patient to utilize infection prevention strategies to reduce risk of respiratory infection Assessed social determinant of health barriers  Weight Loss:  (Status: Goal on Track (progressing): YES.)  Offered to connect patient with psychology or social work support for counseling and supportive care;  Reviewed recommended dietary changes: avoid fad diets, make small/incremental dietary and exercise changes, eat at the table and avoid eating in front of the TV, plan management of cravings, monitor snacking and cravings in food diary; Assessed social determinant of health barriers;  Provided encouragement and therapeutic listening  Patient Goals/Self-Care Activities: Take medications as prescribed   Attend all scheduled provider appointments Call provider office for new concerns or questions  Work with the social worker to address care coordination needs and will continue to work with the clinical team to address health care and disease management related needs

## 2022-09-27 NOTE — Patient Outreach (Signed)
Medicaid Managed Care   Nurse Care Manager Note  09/27/2022 Name:  Mary [name] MRN:  [id number] DOB:  1981/04/26  Mary [name] is an 42 y.o. year old female who is a primary patient of Coral Spikes, DO.  The [facility] Managed Care Coordination team was consulted for assistance with:    COPD Weight Loss  Mary [name] was given information about Medicaid Managed Care Coordination team services today. Mary [name] Patient agreed to services and verbal consent obtained.  Engaged with patient by telephone for follow up visit in response to provider referral for case management and/or care coordination services.   Assessments/Interventions:  Review of past medical history, allergies, medications, health status, including review of consultants reports, laboratory and other test data, was performed as part of comprehensive evaluation and provision of chronic care management services.  SDOH (Social Determinants of Health) assessments and interventions performed: SDOH Interventions    Flowsheet Row Patient Outreach Telephone from 08/28/2022 in [place] Telephone from 08/09/2022 in [place] Office Visit from 06/08/2022 in [place] Office Visit from 03/09/2022 in [place] Office Visit from 02/14/2022 in [place] Visit from 12/27/2021 in [place] Interventions        Food Insecurity Interventions Other (Comment)  [BSW referral for local food pantries] -- -- -- -- --  Housing Interventions Intervention Not Indicated -- -- -- -- --  Transportation Interventions Intervention Not Indicated Intervention Not Indicated -- -- -- --  Utilities Interventions Intervention Not Indicated -- -- -- -- --  Depression Interventions/Treatment  -- -- Medication, Referral to Psychiatry Currently on Treatment Medication,  Currently on Treatment Currently on Treatment, Referral to Psychiatry       Care Plan  Allergies  Allergen Reactions   Buprenorphine Hcl Anaphylaxis    "it stops my heart"   Morphine Anaphylaxis    i quit breathing    Morphine And Related Anaphylaxis    "it stops my heart"   Penicillins Anaphylaxis and Other (See Comments)    Has patient had a PCN reaction causing immediate rash, facial/tongue/throat swelling, SOB or lightheadedness with hypotension: Yes Has patient had a PCN reaction causing severe rash involving mucus membranes or skin necrosis: Yes Has patient had a PCN reaction that required hospitalization: Yes Has patient had a PCN reaction occurring within the last 10 years: No If all of the above answers are "NO", then may proceed with Cephalosporin use.    Gabapentin Other (See Comments)    Per patient "hallucination"   Nsaids    Sulfa Antibiotics     Other reaction(s): Unknown   Ibuprofen-Acetaminophen Nausea And Vomiting   Latex Rash   Tylenol [Acetaminophen] Nausea And Vomiting    Medications Reviewed Today     Reviewed by Melissa Montane, RN (Registered Nurse) on 09/27/22 at 1447  Med List Status: <None>   Medication Order Taking? Sig Documenting Provider Last Dose Status Informant  albuterol (VENTOLIN HFA) 108 (90 Base) MCG/ACT inhaler [id number] Yes Inhale 2 puffs into the lungs every 6 (six) hours as needed for wheezing or shortness of breath. Coral Spikes, DO Taking Active   Atogepant (QULIPTA) 10 MG TABS [id number] Yes Take 10 mg by mouth daily. Baruch Gouty, FNP Taking Active   celecoxib (CELEBREX) 200 MG capsule [id number] Yes Take 1 capsule (200 mg total) by mouth 2 (two) times daily. Arther Abbott  E, MD Taking Active   Cholecalciferol (VITAMIN D3) 1000 units CAPS [id number] Yes Take 3,000 Units by mouth daily. [provider] Taking Active   citalopram (CELEXA) 20 MG tablet [id number] Yes Take by mouth. [provider] Taking Active    dapagliflozin propanediol (FARXIGA) 10 MG TABS tablet [id number] Yes Take 1 tablet (10 mg total) by mouth daily before breakfast. Eileen Stanford, PA-C Taking Active   DULERA 100-5 MCG/ACT AERO [id number] Yes INHALE 2 PUFFS TWICE DAILY Rakes, Connye Burkitt, FNP Taking Active   fluticasone (FLONASE) 50 MCG/ACT nasal spray [id number] Yes Place 1 spray into both nostrils daily. Baruch Gouty, FNP Taking Active   furosemide (LASIX) 20 MG tablet [id number] Yes Take 20 mg by mouth. [provider] Taking Active   hydrOXYzine (VISTARIL) 25 MG capsule [id number] Yes TAKE 1 CAPSULE EVERY 8 HOURS AS NEEDED Lacinda Axon, Jayce G, DO Taking Active   levocetirizine (XYZAL) 5 MG tablet [id number] Yes Take 1 tablet (5 mg total) by mouth every evening. Ameduite, Trenton Gammon, FNP Taking Active   losartan (COZAAR) 50 MG tablet [id number] Yes Take 1 tablet (50 mg total) by mouth daily. Baruch Gouty, FNP Taking Active   melatonin 3 MG TABS tablet [id number] Yes Take 10 mg by mouth at bedtime. [provider] Taking Active   omeprazole (PRILOSEC) 40 MG capsule [id number] Yes Take 40 mg by mouth daily. [provider] Taking Active   ondansetron (ZOFRAN-ODT) 4 MG disintegrating tablet [id number] Yes TAKE 1 TABLET EVERY 8 HOURS AS NEEDED FOR NAUSEA OR VOMITING Cook, Jayce G, DO Taking Active   polyethylene glycol (MIRALAX / GLYCOLAX) 17 g packet [id number] Yes Take 17 g by mouth daily. Ripley Fraise, MD Taking Active   pregabalin (LYRICA) 75 MG capsule [id number] Yes TAKE ONE CAPSULE TWICE DAILY Bayard Hugger, NP Taking Active   promethazine-dextromethorphan (PROMETHAZINE-DM) 6.25-15 MG/5ML syrup [id number] Yes Take 5 mLs by mouth 4 (four) times daily as needed. Coral Spikes, DO Taking Active   spironolactone (ALDACTONE) 25 MG tablet [id number] Yes Take 12.5 mg by mouth daily. [provider] Taking Active   tirzepatide [facility]) 2.5 MG/0.5ML Pen [id number] No Inject 2.5 mg into the skin once a week.   Patient not taking: Reported on 07/07/2022   Ameduite, Trenton Gammon, FNP Not Taking Active   torsemide (DEMADEX) 20 MG tablet [id number] Yes TAKE ONE TABLET ONCE DAILY Coral Spikes, DO Taking Active   traZODone (DESYREL) 100 MG tablet [id number] Yes TAKE TWO TABLETS AT BEDTIME Coral Spikes, DO Taking Active             Patient Active Problem List   Diagnosis Date Noted   Cough 07/09/2022   OSA (obstructive sleep apnea) 03/16/2022   Chronic pain syndrome 05/24/2021   High risk medication use 05/24/2021   Allergic rhinitis 05/24/2021   Morbid obesity ([place]) 06/11/2020   Primary hypertension 06/11/2020   Migraine headache without aura 12/02/2019   History of cervical cancer 02/06/2018   Depression, recurrent ([place]) 02/06/2018   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   COPD (chronic obstructive pulmonary disease) ([place]) 02/03/2014   GAD (generalized anxiety disorder) 02/03/2014   PTSD (post-traumatic stress disorder) 02/03/2014    Conditions to be addressed/monitored per PCP order:  COPD and Weight Loss  Care Plan : RN Care Manager Plan of Care  Updates made by Melissa Montane, RN since 09/27/2022 12:00 AM     Problem: Health Management needs related to Weight loss and COPD  Long-Range Goal: Development of Plan of Car to address Health Management needs related to Weight loss and COPD   Start Date: 08/28/2022  Expected End Date: 11/26/2022  Note:   Current Barriers:  Chronic Disease Management support and education needs related to COPD and Weight Loss Patient is in good spirits today. She has lost #2 and working at improving her diet and exercising daily. She is working with a Tourist information centre manager with [employer], who is helping her with disability and getting Medicare and Medicaid. She is attending appointments with Bariatrics, but would like to lose weight without having surgery.  RNCM Clinical Goal(s):  Patient will verbalize understanding of plan for management of COPD and weight loss as  evidenced by patient reports take all medications exactly as prescribed and will call provider for medication related questions as evidenced by patient reports    attend all scheduled medical appointments: schedule follow up with Pulmonology, 10/10/22 with Nutrition as evidenced by provider documentation        work with Education officer, museum to address Financial constraints related to utilities, Limited access to food, and applying for disability related to the management of COPD and weight loss as evidenced by review of EMR and patient or Education officer, museum report     through collaboration with Consulting civil engineer, provider, and care team.   Interventions: Inter-disciplinary care team collaboration (see longitudinal plan of care) Evaluation of current treatment plan related to  self management and patient's adherence to plan as established by provider Referral to LCSW for mental health needs and managing stress, scheduled on 10/06/22 Reviewed provider notes and discussed with patient    COPD: (Status: Goal on Track (progressing): YES.) Long Term Goal  Reviewed medications with patient, including use of prescribed maintenance and rescue inhalers, and provided instruction on medication management and the importance of adherence Advised patient to engage in light exercise as tolerated 3-5 days a week to aid in the the management of COPD Provided education about and advised patient to utilize infection prevention strategies to reduce risk of respiratory infection Assessed social determinant of health barriers  Weight Loss:  (Status: Goal on Track (progressing): YES.)  Offered to connect patient with psychology or social work support for counseling and supportive care;  Reviewed recommended dietary changes: avoid fad diets, make small/incremental dietary and exercise changes, eat at the table and avoid eating in front of the TV, plan management of cravings, monitor snacking and cravings in food diary; Assessed social  determinant of health barriers;  Provided encouragement and therapeutic listening  Patient Goals/Self-Care Activities: Take medications as prescribed   Attend all scheduled provider appointments Call provider office for new concerns or questions  Work with the social worker to address care coordination needs and will continue to work with the clinical team to address health care and disease management related needs       Follow Up:  Patient agrees to Care Plan and Follow-up.  Plan: The Managed Medicaid care management team will reach out to the patient again over the next 30 days.  Date/time of next scheduled RN care management/care coordination outreach:  10/27/22 @ 10:30am   Lurena Joiner RN, BSN [place] RN Care Coordinator

## 2022-09-29 ENCOUNTER — Other Ambulatory Visit: Payer: Self-pay | Admitting: Family Medicine

## 2022-09-29 DIAGNOSIS — M25472 Effusion, left ankle: Secondary | ICD-10-CM

## 2022-09-29 DIAGNOSIS — F411 Generalized anxiety disorder: Secondary | ICD-10-CM

## 2022-09-29 DIAGNOSIS — R110 Nausea: Secondary | ICD-10-CM

## 2022-10-06 ENCOUNTER — Other Ambulatory Visit: Payer: Medicaid Other | Admitting: Licensed Clinical Social Worker

## 2022-10-06 DIAGNOSIS — F411 Generalized anxiety disorder: Secondary | ICD-10-CM

## 2022-10-06 DIAGNOSIS — F339 Major depressive disorder, recurrent, unspecified: Secondary | ICD-10-CM

## 2022-10-06 DIAGNOSIS — F4310 Post-traumatic stress disorder, unspecified: Secondary | ICD-10-CM

## 2022-10-06 NOTE — Patient Outreach (Signed)
Medicaid Managed Care Social Work Note  10/06/2022 Name:  Mary [name] MRN:  [id number] DOB:  [birth year]  Mary [name] is an 42 y.o. year old female who is a primary patient of Coral Spikes, DO.  The Medicaid Managed Care Coordination team was consulted for assistance with:  [place] and Resources  [name] was given information about Medicaid Managed Care Coordination team services today. [name] Patient agreed to services and verbal consent obtained.  Engaged with patient  for by telephone forinitial visit in response to referral for case management and/or care coordination services.   Assessments/Interventions:  Review of past medical history, allergies, medications, health status, including review of consultants reports, laboratory and other test data, was performed as part of comprehensive evaluation and provision of chronic care management services.  SDOH: (Social Determinant of Health) assessments and interventions performed: SDOH Interventions    Flowsheet Row Patient Outreach Telephone from 10/06/2022 in [place] Patient Outreach Telephone from 08/28/2022 in [place] Telephone from 08/09/2022 in [place] Office Visit from 06/08/2022 in [place] Office Visit from 03/09/2022 in [place] Office Visit from 02/14/2022 in [place] Interventions        Food Insecurity Interventions -- Other (Comment)  [BSW referral for local food pantries] -- -- -- --  Housing Interventions -- Intervention Not Indicated -- -- -- --  Transportation Interventions -- Intervention Not Indicated Intervention Not Indicated -- -- --  Utilities Interventions -- Intervention Not Indicated -- -- -- --  Depression Interventions/Treatment  Referral to Psychiatry, Medication -- --  Medication, Referral to Psychiatry Currently on Treatment Medication, Currently on Treatment  Stress Interventions Offered [employer], Provide Counseling -- -- -- -- --       Advanced Directives Status:  See Care Plan for related entries.  Care Plan                 Allergies  Allergen Reactions   Buprenorphine Hcl Anaphylaxis    "it stops my heart"   Morphine Anaphylaxis    i quit breathing    Morphine And Related Anaphylaxis    "it stops my heart"   Penicillins Anaphylaxis and Other (See Comments)    Has patient had a PCN reaction causing immediate rash, facial/tongue/throat swelling, SOB or lightheadedness with hypotension: Yes Has patient had a PCN reaction causing severe rash involving mucus membranes or skin necrosis: Yes Has patient had a PCN reaction that required hospitalization: Yes Has patient had a PCN reaction occurring within the last 10 years: No If all of the above answers are "NO", then may proceed with Cephalosporin use.    Gabapentin Other (See Comments)    Per patient "hallucination"   Nsaids    Sulfa Antibiotics     Other reaction(s): Unknown   Ibuprofen-Acetaminophen Nausea And Vomiting   Latex Rash   Tylenol [Acetaminophen] Nausea And Vomiting    Medications Reviewed Today     Reviewed by Melissa Montane, RN (Registered Nurse) on 09/27/22 at 1447  Med List Status: <None>   Medication Order Taking? Sig Documenting Provider Last Dose Status Informant  albuterol (VENTOLIN HFA) 108 (90 Base) MCG/ACT inhaler [id number] Yes Inhale 2 puffs into the lungs every 6 (six) hours as needed for wheezing or shortness of breath. Cook, [place], DO Taking Active   Atogepant (QULIPTA) 10 MG TABS  [id number] Yes Take 10 mg by mouth daily. Baruch Gouty, FNP Taking Active   celecoxib (CELEBREX) 200 MG capsule [id number] Yes Take 1 capsule (200 mg total) by mouth 2 (two) times daily. Carole Civil, MD Taking Active   Cholecalciferol (VITAMIN D3) 1000  units CAPS [id number] Yes Take 3,000 Units by mouth daily. [provider] Taking Active   citalopram (CELEXA) 20 MG tablet [id number] Yes Take by mouth. [provider] Taking Active   dapagliflozin propanediol (FARXIGA) 10 MG TABS tablet [id number] Yes Take 1 tablet (10 mg total) by mouth daily before breakfast. Eileen Stanford, PA-C Taking Active   DULERA 100-5 MCG/ACT AERO [id number] Yes INHALE 2 PUFFS TWICE DAILY Rakes, Connye Burkitt, FNP Taking Active   fluticasone (FLONASE) 50 MCG/ACT nasal spray [id number] Yes Place 1 spray into both nostrils daily. Baruch Gouty, FNP Taking Active   furosemide (LASIX) 20 MG tablet [id number] Yes Take 20 mg by mouth. [provider] Taking Active   hydrOXYzine (VISTARIL) 25 MG capsule [id number] Yes TAKE 1 CAPSULE EVERY 8 HOURS AS NEEDED Lacinda Axon, Jayce G, DO Taking Active   levocetirizine (XYZAL) 5 MG tablet [id number] Yes Take 1 tablet (5 mg total) by mouth every evening. Ameduite, Trenton Gammon, FNP Taking Active   losartan (COZAAR) 50 MG tablet [id number] Yes Take 1 tablet (50 mg total) by mouth daily. Baruch Gouty, FNP Taking Active   melatonin 3 MG TABS tablet [id number] Yes Take 10 mg by mouth at bedtime. [provider] Taking Active   omeprazole (PRILOSEC) 40 MG capsule [id number] Yes Take 40 mg by mouth daily. [provider] Taking Active   ondansetron (ZOFRAN-ODT) 4 MG disintegrating tablet [id number] Yes TAKE 1 TABLET EVERY 8 HOURS AS NEEDED FOR NAUSEA OR VOMITING Cook, Jayce G, DO Taking Active   polyethylene glycol (MIRALAX / GLYCOLAX) 17 g packet [id number] Yes Take 17 g by mouth daily. Ripley Fraise, MD Taking Active   pregabalin (LYRICA) 75 MG capsule [id number] Yes TAKE ONE CAPSULE TWICE DAILY Bayard Hugger, NP Taking Active   promethazine-dextromethorphan (PROMETHAZINE-DM) 6.25-15 MG/5ML syrup [id number] Yes Take 5 mLs by mouth 4 (four) times daily as needed. Coral Spikes, DO Taking Active   spironolactone  (ALDACTONE) 25 MG tablet [id number] Yes Take 12.5 mg by mouth daily. [provider] Taking Active   tirzepatide [facility]) 2.5 MG/0.5ML Pen [id number] No Inject 2.5 mg into the skin once a week.  Patient not taking: Reported on 07/07/2022   Ameduite, Trenton Gammon, FNP Not Taking Active   torsemide (DEMADEX) 20 MG tablet [id number] Yes TAKE ONE TABLET ONCE DAILY Coral Spikes, DO Taking Active   traZODone (DESYREL) 100 MG tablet [id number] Yes TAKE TWO TABLETS AT BEDTIME Coral Spikes, DO Taking Active             Patient Active Problem List   Diagnosis Date Noted   Cough 07/09/2022   OSA (obstructive sleep apnea) 03/16/2022   Chronic pain syndrome 05/24/2021   High risk medication use 05/24/2021   Allergic rhinitis 05/24/2021   Morbid obesity ([place]) 06/11/2020   Primary hypertension 06/11/2020   Migraine headache without aura 12/02/2019   History of cervical cancer 02/06/2018   Depression, recurrent ([place]) 02/06/2018   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   COPD (chronic obstructive pulmonary disease) ([place]) 02/03/2014   GAD (generalized anxiety disorder) 02/03/2014   PTSD (post-traumatic stress disorder) 02/03/2014    Conditions to be addressed/monitored per PCP order:  Anxiety and Depression  Care  Plan : LCSW Plan of Care  Updates made by Greg Cutter, LCSW since 10/06/2022 12:00 AM     Problem: Anxiety Identification (Anxiety)      Long-Range Goal: Anxiety Symptoms Identified   Start Date: 10/06/2022  Note:   Priority: High  Timeframe:  Long-Range Goal Priority:  High Start Date:   10/06/22      Expected End Date:  ongoing                     Follow Up Date--10/17/22 at 1030   - keep 90 percent of scheduled appointments -consider counseling or psychiatry -consider bumping up your self-care  -consider creating a stronger support network   Why is this important?             Combatting depression may take some time.            If you don't feel better right away, don't  give up on your treatment plan.    Current barriers:   Chronic Mental Health needs related to PTSD, depression, stress and anxiety. Patient requires Support, Education, Resources, Referrals, Advocacy, and Care Coordination, in order to meet Unmet Mental Health Needs. Patient has a history of trauma Patient will implement clinical interventions discussed today to decrease symptoms of depression and increase knowledge and/or ability of: coping skills. Mental Health Concerns and Social Isolation Patient lacks knowledge of available community counseling agencies and resources. Ongoing issues with insomnia   Clinical Goal(s): verbalize understanding of plan for management of PTSD, Anxiety, Depression, and Stress and demonstrate a reduction in symptoms. Patient will connect with a provider for ongoing mental health treatment, increase coping skills, healthy habits, self-management skills, and stress reduction        Clinical Interventions:  Assessed patient's previous and current treatment, coping skills, support system and barriers to care. Patient provided hx  Verbalization of feelings encouraged, motivational interviewing employed Emotional support provided, positive coping strategies explored. Establishing healthy boundaries emphasized and healthy self-care education provided Patient was educated on available mental health resources within their area that accept Medicaid and offer counseling and psychiatry. Patient is agreeable to referral to [facility]  for counseling at [facility] at [facility] and understands that an additional` referral will have to be made later once they start accepting new clients again for psychiatry. [facility] LCSW made referral for counseling only on 10/06/22.  Email sent to patient today with instructions for scheduling at [facility] as well as some crisis support resources and [facility]'s walk in clinic hours LCSW provided education on relaxation  techniques such as meditation, deep breathing, massage, grounding exercises or yoga that can activate the body's relaxation response and ease symptoms of stress and anxiety. LCSW ask that when pt is struggling with difficult emotions and racing thoughts that they start this relaxation response process. LCSW provided extensive education on healthy coping skills for anxiety. SW used active and reflective listening, validated patient's feelings/concerns, and provided emotional support. Patient will work on implementing appropriate self-care habits into their daily routine such as: staying positive, writing a gratitude list, drinking water, staying active around the house, taking their medications as prescribed, combating negative thoughts or emotions and staying connected with their family and friends. Positive reinforcement provided for this decision to work on this. LCSW provided education on healthy sleep hygiene and what that looks like. LCSW encouraged patient to implement a night time routine into their schedule that works best for them  and that they are able to maintain. Advised patient to implement deep breathing/grounding/meditation/self-care exercises into their nightly routine to combat racing thoughts at night. LCSW encouraged patient to wake up at the same time each day, make their sleeping environment comfortable, exercise when able, to limit naps and to not eat or drink anything right before bed.  Motivational Interviewing employed Depression screen reviewed  PHQ2/ PHQ9 completed or reviewed  Mindfulness or Relaxation training provided Active listening / Reflection utilized  Advance Care and HCPOA education provided Emotional Support Provided Problem [place] strategies reviewed Provided psychoeducation for mental health needs  Provided brief CBT  Reviewed mental health medications and discussed importance of compliance:  Quality of sleep assessed & Sleep Hygiene techniques  promoted  Participation in counseling encouraged  Verbalization of feelings encouraged  Suicidal Ideation/Homicidal Ideation assessed: Patient denies SI/HI  Review resources, discussed options and provided patient information about  [place] care team collaboration (see longitudinal plan of care) Patient Goals/Self-Care Activities: Over the next 120 days Attend scheduled medical appointments Utilize healthy coping skills and supportive resources discussed Contact PCP with any questions or concerns Keep 90 percent of counseling appointments Call your insurance provider for more information about your Enhanced Benefits  Check out counseling resources provided  Begin personal counseling with LCSW, to reduce and manage symptoms of Depression and Stress, until well-established with mental health provider Accept all calls from representative with [place] in an effort to establish ongoing mental health counseling and supportive services. Incorporate into daily practice - relaxation techniques, deep breathing exercises, and mindfulness meditation strategies. Talk about feelings with friends, family members, spiritual advisor, etc. Contact LCSW directly [phone]), if you have questions, need assistance, or if additional social work needs are identified between now and our next scheduled telephone outreach call. Call [phone] for mental health hotline/crisis line if needed (24/7 available) Try techniques to reduce symptoms of anxiety/negative thinking (deep breathing, distraction, positive self talk, etc)  - develop a personal safety plan - develop a plan to deal with triggers like holidays, anniversaries - exercise at least 2 to 3 times per week - have a plan for how to handle bad days - journal feelings and what helps to feel better or worse - spend time or talk with others at least 2 to 3 times per week - watch for early signs of feeling worse - begin  personal counseling - call and visit an old friend - check out volunteer opportunities - join a support group - laugh; watch a funny movie or comedian - learn and use visualization or guided imagery - perform a random act of kindness - practice relaxation or meditation daily - start or continue a personal journal - practice positive thinking and self-talk -continue with compliance of taking medication  -identify current effective and ineffective coping strategies.  -implement positive self-talk in care to increase self-esteem, confidence and feelings of control.  -consider alternative and complementary therapy approaches such as meditation, mindfulness or yoga.  -journaling, prayer, worship services, meditation or pastoral counseling.  -increase participation in pleasurable group activities such as hobbies, singing, sports or volunteering).  -consider the use of meditative movement therapy such as tai chi, yoga or qigong.  -start a regular daily exercise program based on tolerance, ability and patient choice to support positive thinking and activity    If you are experiencing a Mental Health or [place] or need someone to talk to, please call the Suicide and Crisis Lifeline: [phone]  24- Hour Availability:    [facility]  [address] [place], [place] Crisis [phone]   [facility] the [employer] [place]  [phone]    [place]  [phone] (after hours)   Therapeutic Alternative/Mobile Crisis   [phone]   [place] National Suicide Hotline  619-441-4208 [place] Mon[place]) [place] 988   Call 911 or go to emergency room   [facility]  [phone]);  [place] and [employer]  [phone]); [place], [place], [place], [place], [place], [place], [place]        10 LITTLE Things To Do When You're Feeling Too Down To Do  Anything  Take a shower. Even if you plan to stay in all day long and not see a soul, take a shower. It takes the most effort to hop in to the shower but once you do, you'll feel immediate results. It will wake you up and you'll be feeling much fresher (and cleaner too).  Brush and floss your teeth. Give your teeth a good brushing with a floss finish. It's a small task but it feels so good and you can check 'taking care of your health' off the list of things to do.  Do something small on your list. Most of [place] have some small thing we would like to get done (load of laundry, sew a button, email a friend). Doing one of these things will make you feel like you've accomplished something.  Drink water. Drinking water is easy right? It's also really beneficial for your health so keep a glass beside you all day and take sips often. It gives you energy and prevents you from boredom eating.  Do some floor exercises. The last thing you want to do is exercise but it might be just the thing you need the most. Keep it simple and do exercises that involve sitting or laying on the floor. Even the smallest of exercises release chemicals in the brain that make you feel good. Yoga stretches or core exercises are going to make you feel good with minimal effort.  Make your bed. Making your bed takes a few minutes but it's productive and you'll feel relieved when it's done. An unmade bed is a huge visual reminder that you're having an unproductive day. Do it and consider it your housework for the day.  Put on some nice clothes. Take the sweatpants off even if you don't plan to go anywhere. Put on clothes that make you feel good. Take a look in the mirror so your brain recognizes the sweatpants have been replaced with clothes that make you look great. It's an instant confidence booster.  Wash the dishes. A pile of dirty dishes in the sink is a reflection of your mood. It's possible that if you wash up the dishes,  your mood will follow suit. It's worth a try.  Cook a real meal. If you have the luxury to have a "do nothing" day, you have time to make a real meal for yourself. Make a meal that you love to eat. The process is good to get you out of the funk and the food will ensure you have more energy for tomorrow.  Write out your thoughts by hand. When you hand write, you stimulate your brain to focus on the moment that you're in so make yourself comfortable and write whatever comes into your mind. Put those thoughts out on paper  so they stop spinning around in your head. Those thoughts might be the very thing holding you down.    Patient Goals: Initial goal      Follow up:  Patient agrees to Care Plan and Follow-up.  Plan: The Managed Medicaid care management team will reach out to the patient again over the next 30 days.  Date/time of next scheduled Social Work care management/care coordination outreach:  10/17/22 at [place], [place], MSW, [place] Medicaid [email or web address] [email or web address] Phone: [phone]

## 2022-10-06 NOTE — Patient Instructions (Signed)
Visit Information  Ms. Foor was given information about Medicaid Managed Care team care coordination services as a part of their [facility] Medicaid benefit. [name] verbally consented to engagement with the [facility] Managed Care team.   If you are experiencing a medical emergency, please call 911 or report to your local emergency department or urgent care.   If you have a non-emergency medical problem during routine business hours, please contact your provider's office and ask to speak with a nurse.   For questions related to your [facility] health plan, please call: [phone] or go here:[email or web address]  If you would like to schedule transportation through your [facility] plan, please call the following number at least 2 days in advance of your appointment: [phone].  You can also use the MTM portal or MTM mobile app to manage your rides. For the portal, please go to [email or web address].  Call the [facility] at [phone], at any time, 24 hours a day, 7 days a week. If you are in danger or need immediate medical attention call 911.  If you would like help to quit smoking, call 1-800-QUIT-NOW 779-638-1256) OR Espaol: 1-855-Djelo-Ya QO:409462) o para ms informacin haga clic aqu or Text READY to 200-400 to register via text   Following is a copy of your plan of care:  Care Plan : LCSW Plan of Care  Updates made by Greg Cutter, LCSW since 10/06/2022 12:00 AM     Problem: Anxiety Identification (Anxiety)      Long-Range Goal: Anxiety Symptoms Identified   Start Date: 10/06/2022  Note:   Priority: High  Timeframe:  Long-Range Goal Priority:  High Start Date:   10/06/22      Expected End Date:  ongoing                     Follow Up Date--10/17/22 at 1030   - keep 90 percent of scheduled appointments -consider counseling or psychiatry -consider bumping up your self-care  -consider creating a stronger  support network   Why is this important?             Combatting depression may take some time.            If you don't feel better right away, don't give up on your treatment plan.    Current barriers:   Chronic Mental Health needs related to PTSD, depression, stress and anxiety. Patient requires Support, Education, Resources, Referrals, Advocacy, and Care Coordination, in order to meet Unmet Mental Health Needs. Patient has a history of trauma Patient will implement clinical interventions discussed today to decrease symptoms of depression and increase knowledge and/or ability of: coping skills. Mental Health Concerns and Social Isolation Patient lacks knowledge of available community counseling agencies and resources. Ongoing issues with insomnia   Clinical Goal(s): verbalize understanding of plan for management of PTSD, Anxiety, Depression, and Stress and demonstrate a reduction in symptoms. Patient will connect with a provider for ongoing mental health treatment, increase coping skills, healthy habits, self-management skills, and stress reduction         Patient Goals/Self-Care Activities: Over the next 120 days Attend scheduled medical appointments Utilize healthy coping skills and supportive resources discussed Contact PCP with any questions or concerns Keep 90 percent of counseling appointments Call your insurance provider for more information about your Enhanced Benefits  Check out counseling resources provided  Begin personal counseling with LCSW, to reduce and manage symptoms of Depression and Stress, until well-established with  mental health provider Accept all calls from representative with [facility] in an effort to establish ongoing mental health counseling and supportive services. Incorporate into daily practice - relaxation techniques, deep breathing exercises, and mindfulness meditation strategies. Talk about feelings with friends, family members, spiritual advisor,  etc. Contact LCSW directly [phone]), if you have questions, need assistance, or if additional social work needs are identified between now and our next scheduled telephone outreach call. Call 988 for mental health hotline/crisis line if needed (24/7 available) Try techniques to reduce symptoms of anxiety/negative thinking (deep breathing, distraction, positive self talk, etc)  - develop a personal safety plan - develop a plan to deal with triggers like holidays, anniversaries - exercise at least 2 to 3 times per week - have a plan for how to handle bad days - journal feelings and what helps to feel better or worse - spend time or talk with others at least 2 to 3 times per week - watch for early signs of feeling worse - begin personal counseling - call and visit an old friend - check out volunteer opportunities - join a support group - laugh; watch a funny movie or comedian - learn and use visualization or guided imagery - perform a random act of kindness - practice relaxation or meditation daily - start or continue a personal journal - practice positive thinking and self-talk -continue with compliance of taking medication  -identify current effective and ineffective coping strategies.  -implement positive self-talk in care to increase self-esteem, confidence and feelings of control.  -consider alternative and complementary therapy approaches such as meditation, mindfulness or yoga.  -journaling, prayer, worship services, meditation or pastoral counseling.  -increase participation in pleasurable group activities such as hobbies, singing, sports or volunteering).  -consider the use of meditative movement therapy such as tai chi, yoga or qigong.  -start a regular daily exercise program based on tolerance, ability and patient choice to support positive thinking and activity    If you are experiencing a Mental Health or [place] or need someone to talk to, please call  the Suicide and Crisis Lifeline: [place] Availability:    [facility]  [address] [place], [place] Crisis [phone]   [facility] [employer] [phone]   [place]  [phone]    [place]  [phone] (after hours)   Therapeutic Alternative/Mobile Crisis   [phone]   [place] National Suicide Hotline  (301)617-3113 (TALK) OR 988   Call 911 or go to emergency room   [facility]  [phone]);  [place] and [employer]  [phone]); [place], [place], [place], [place], [place], [place], [place]        10 LITTLE Things To Do When You're Feeling Too Down To Do Anything  Take a shower. Even if you plan to stay in all day long and not see a soul, take a shower. It takes the most effort to hop in to the shower but once you do, you'll feel immediate results. It will wake you up and you'll be feeling much fresher (and cleaner too).  Brush and floss your teeth. Give your teeth a good brushing with a floss finish. It's a small task but it feels so good and you can check 'taking care of your health' off the list of things to do.  Do something small on your list. Most of [place] have  some small thing we would like to get done (load of laundry, sew a button, email a friend). Doing one of these things will make you feel like you've accomplished something.  Drink water. Drinking water is easy right? It's also really beneficial for your health so keep a glass beside you all day and take sips often. It gives you energy and prevents you from boredom eating.  Do some floor exercises. The last thing you want to do is exercise but it might be just the thing you need the most. Keep it simple and do exercises that involve sitting or laying on the floor. Even the smallest of exercises release chemicals in the brain that make you feel good.  Yoga stretches or core exercises are going to make you feel good with minimal effort.  Make your bed. Making your bed takes a few minutes but it's productive and you'll feel relieved when it's done. An unmade bed is a huge visual reminder that you're having an unproductive day. Do it and consider it your housework for the day.  Put on some nice clothes. Take the sweatpants off even if you don't plan to go anywhere. Put on clothes that make you feel good. Take a look in the mirror so your brain recognizes the sweatpants have been replaced with clothes that make you look great. It's an instant confidence booster.  Wash the dishes. A pile of dirty dishes in the sink is a reflection of your mood. It's possible that if you wash up the dishes, your mood will follow suit. It's worth a try.  Cook a real meal. If you have the luxury to have a "do nothing" day, you have time to make a real meal for yourself. Make a meal that you love to eat. The process is good to get you out of the funk and the food will ensure you have more energy for tomorrow.  Write out your thoughts by hand. When you hand write, you stimulate your brain to focus on the moment that you're in so make yourself comfortable and write whatever comes into your mind. Put those thoughts out on paper so they stop spinning around in your head. Those thoughts might be the very thing holding you down.    Patient Goals: Initial goal

## 2022-10-08 DIAGNOSIS — G4733 Obstructive sleep apnea (adult) (pediatric): Secondary | ICD-10-CM | POA: Diagnosis not present

## 2022-10-17 ENCOUNTER — Other Ambulatory Visit: Payer: Medicaid Other | Admitting: Licensed Clinical Social Worker

## 2022-10-17 NOTE — Patient Outreach (Addendum)
Medicaid Managed Care Social Work Note  10/17/2022 Name:  [name] MRN:  [id number] DOB:  1980/11/08  Mary [name] is an 42 y.o. year old female who is a primary patient of Mary Spikes, DO.  The Medicaid Managed Care Coordination team was consulted for assistance with:  Mary [name] and Resources  Mary [name] was given information about Medicaid Managed Care Coordination team services today. Mary [name] Patient agreed to services and verbal consent obtained.  Engaged with patient  for by telephone forfollow up visit in response to referral for case management and/or care coordination services.   Assessments/Interventions:  Review of past medical history, allergies, medications, health status, including review of consultants reports, laboratory and other test data, was performed as part of comprehensive evaluation and provision of chronic care management services.  SDOH: (Social Determinant of Health) assessments and interventions performed: SDOH Interventions    Flowsheet Row Patient Outreach Telephone from 10/06/2022 in [place] Patient Outreach Telephone from 08/28/2022 in [place] Telephone from 08/09/2022 in [place] Office Visit from 06/08/2022 in [place] Office Visit from 03/09/2022 in [place] Office Visit from 02/14/2022 in [place] Interventions        Food Insecurity Interventions -- Other (Comment)  [BSW referral for local food pantries] -- -- -- --  Housing Interventions -- Intervention Not Indicated -- -- -- --  Transportation Interventions -- Intervention Not Indicated Intervention Not Indicated -- -- --  Utilities Interventions -- Intervention Not Indicated -- -- -- --  Depression Interventions/Treatment  Referral to Psychiatry, Medication -- --  Medication, Referral to Psychiatry Currently on Treatment Medication, Currently on Treatment  Stress Interventions Offered [employer], Provide Counseling -- -- -- -- --       Advanced Directives Status:  See Care Plan for related entries.  Care Plan                 Allergies  Allergen Reactions   Buprenorphine Hcl Anaphylaxis    "it stops my heart"   Morphine Anaphylaxis    i quit breathing    Morphine And Related Anaphylaxis    "it stops my heart"   Penicillins Anaphylaxis and Other (See Comments)    Has patient had a PCN reaction causing immediate rash, facial/tongue/throat swelling, SOB or lightheadedness with hypotension: Yes Has patient had a PCN reaction causing severe rash involving mucus membranes or skin necrosis: Yes Has patient had a PCN reaction that required hospitalization: Yes Has patient had a PCN reaction occurring within the last 10 years: No If all of the above answers are "NO", then may proceed with Cephalosporin use.    Gabapentin Other (See Comments)    Per patient "hallucination"   Nsaids    Sulfa Antibiotics     Other reaction(s): Unknown   Ibuprofen-Acetaminophen Nausea And Vomiting   Latex Rash   Tylenol [Acetaminophen] Nausea And Vomiting    Medications Reviewed Today     Reviewed by Melissa Montane, RN (Registered Nurse) on 09/27/22 at 1447  Med List Status: <None>   Medication Order Taking? Sig Documenting Provider Last Dose Status Informant  albuterol (VENTOLIN HFA) 108 (90 Base) MCG/ACT inhaler [id number] Yes Inhale 2 puffs into the lungs every 6 (six) hours as needed for wheezing or shortness of breath. Mary Spikes, DO Taking Active   Atogepant (QULIPTA) 10 MG  TABS [id number] Yes Take 10 mg by mouth daily. Baruch Gouty, FNP Taking Active   celecoxib (CELEBREX) 200 MG capsule [id number] Yes Take 1 capsule (200 mg total) by mouth 2 (two) times daily. Carole Civil, MD Taking Active   Cholecalciferol (VITAMIN D3) 1000  units CAPS [id number] Yes Take 3,000 Units by mouth daily. [provider] Taking Active   citalopram (CELEXA) 20 MG tablet [id number] Yes Take by mouth. [provider] Taking Active   dapagliflozin propanediol (FARXIGA) 10 MG TABS tablet [id number] Yes Take 1 tablet (10 mg total) by mouth daily before breakfast. Mary Stanford, PA-C Taking Active   DULERA 100-5 MCG/ACT AERO [id number] Yes INHALE 2 PUFFS TWICE DAILY Rakes, Connye Burkitt, FNP Taking Active   fluticasone (FLONASE) 50 MCG/ACT nasal spray [id number] Yes Place 1 spray into both nostrils daily. Baruch Gouty, FNP Taking Active   furosemide (LASIX) 20 MG tablet [id number] Yes Take 20 mg by mouth. [provider] Taking Active   hydrOXYzine (VISTARIL) 25 MG capsule [id number] Yes TAKE 1 CAPSULE EVERY 8 HOURS AS NEEDED Lacinda Axon, Jayce G, DO Taking Active   levocetirizine (XYZAL) 5 MG tablet [id number] Yes Take 1 tablet (5 mg total) by mouth every evening. Ameduite, Trenton Gammon, FNP Taking Active   losartan (COZAAR) 50 MG tablet [id number] Yes Take 1 tablet (50 mg total) by mouth daily. Baruch Gouty, FNP Taking Active   melatonin 3 MG TABS tablet [id number] Yes Take 10 mg by mouth at bedtime. [provider] Taking Active   omeprazole (PRILOSEC) 40 MG capsule [id number] Yes Take 40 mg by mouth daily. [provider] Taking Active   ondansetron (ZOFRAN-ODT) 4 MG disintegrating tablet [id number] Yes TAKE 1 TABLET EVERY 8 HOURS AS NEEDED FOR NAUSEA OR VOMITING Cook, Jayce G, DO Taking Active   polyethylene glycol (MIRALAX / GLYCOLAX) 17 g packet [id number] Yes Take 17 g by mouth daily. Mary Fraise, MD Taking Active   pregabalin (LYRICA) 75 MG capsule [id number] Yes TAKE ONE CAPSULE TWICE DAILY Bayard Hugger, NP Taking Active   promethazine-dextromethorphan (PROMETHAZINE-DM) 6.25-15 MG/5ML syrup [id number] Yes Take 5 mLs by mouth 4 (four) times daily as needed. Mary Spikes, DO Taking Active   spironolactone  (ALDACTONE) 25 MG tablet [id number] Yes Take 12.5 mg by mouth daily. [provider] Taking Active   tirzepatide [facility]) 2.5 MG/0.5ML Pen [id number] No Inject 2.5 mg into the skin once a week.  Patient not taking: Reported on 07/07/2022   Ameduite, Trenton Gammon, FNP Not Taking Active   torsemide (DEMADEX) 20 MG tablet [id number] Yes TAKE ONE TABLET ONCE DAILY Mary Spikes, DO Taking Active   traZODone (DESYREL) 100 MG tablet [id number] Yes TAKE TWO TABLETS AT BEDTIME Mary Spikes, DO Taking Active             Patient Active Problem List   Diagnosis Date Noted   Cough 07/09/2022   OSA (obstructive sleep apnea) 03/16/2022   Chronic pain syndrome 05/24/2021   High risk medication use 05/24/2021   Allergic rhinitis 05/24/2021   Morbid obesity ([place]) 06/11/2020   Primary hypertension 06/11/2020   Migraine headache without aura 12/02/2019   History of cervical cancer 02/06/2018   Depression, recurrent ([place]) 02/06/2018   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   COPD (chronic obstructive pulmonary disease) ([place]) 02/03/2014   GAD (generalized anxiety disorder) 02/03/2014   PTSD (post-traumatic stress disorder) 02/03/2014    Conditions to be addressed/monitored per PCP order:  Anxiety  Care Plan :  LCSW Plan of Care  Updates made by Greg Cutter, LCSW since 10/17/2022 12:00 AM     Problem: Anxiety Identification (Anxiety)      Long-Range Goal: Anxiety Symptoms Identified   Start Date: 10/06/2022  Note:   Priority: High  Timeframe:  Long-Range Goal Priority:  High Start Date:   10/06/22      Expected End Date:  ongoing                     Follow Up Date--10/25/22 at 10:45 am  - keep 90 percent of scheduled appointments -consider counseling or psychiatry -consider bumping up your self-care  -consider creating a stronger support network   Why is this important?             Combatting depression may take some time.            If you don't feel better right away, don't give up on  your treatment plan.    Current barriers:   Chronic Mental Health needs related to PTSD, depression, stress and anxiety. Patient requires Support, Education, Resources, Referrals, Advocacy, and Care Coordination, in order to meet Unmet Mental Health Needs. Patient has a history of trauma Patient will implement clinical interventions discussed today to decrease symptoms of depression and increase knowledge and/or ability of: coping skills. Mental Health Concerns and Social Isolation Patient lacks knowledge of available community counseling agencies and resources. Ongoing issues with insomnia   Clinical Goal(s): verbalize understanding of plan for management of PTSD, Anxiety, Depression, and Stress and demonstrate a reduction in symptoms. Patient will connect with a provider for ongoing mental health treatment, increase coping skills, healthy habits, self-management skills, and stress reduction        Clinical Interventions:  Assessed patient's previous and current treatment, coping skills, support system and barriers to care. Patient provided hx  Verbalization of feelings encouraged, motivational interviewing employed Emotional support provided, positive coping strategies explored. Establishing healthy boundaries emphasized and healthy self-care education provided Patient was educated on available mental health resources within their area that accept Medicaid and offer counseling and psychiatry. Patient is agreeable to referral to [facility]  for [facility] at [facility]  and understands that an additional` referral will have to be made later once they start accepting new clients again for psychiatry. [facility] LCSW made referral for counseling only on 10/06/22. LCSW 10/17/22 update- [facility] LCSW spoke with [place] [facility] staff and was informed that they are booked out until May of 2024. Patient was provided this update but had to take another call that was  coming in from her PCP office. [facility] LCSW sent email to patient with resources for her to review until next week's follow up social work appointment.   Email sent to patient today with instructions for scheduling at [facility] [place] as well as some crisis support resources and [facility]'s walk in clinic hours LCSW provided education on relaxation techniques such as meditation, deep breathing, massage, grounding exercises or yoga that can activate the body's relaxation response and ease symptoms of stress and anxiety. LCSW ask that when pt is struggling with difficult emotions and racing thoughts that they start this relaxation response process. LCSW provided extensive education on healthy coping skills for anxiety. SW used active and reflective listening, validated patient's feelings/concerns, and provided emotional support. Patient will work on implementing appropriate self-care habits into their daily routine such as: staying positive, writing a gratitude list,  drinking water, staying active around the house, taking their medications as prescribed, combating negative thoughts or emotions and staying connected with their family and friends. Positive reinforcement provided for this decision to work on this. LCSW provided education on healthy sleep hygiene and what that looks like. LCSW encouraged patient to implement a night time routine into their schedule that works best for them and that they are able to maintain. Advised patient to implement deep breathing/grounding/meditation/self-care exercises into their nightly routine to combat racing thoughts at night. LCSW encouraged patient to wake up at the same time each day, make their sleeping environment comfortable, exercise when able, to limit naps and to not eat or drink anything right before bed.  Motivational Interviewing employed Depression screen reviewed  PHQ2/ PHQ9 completed or reviewed  Mindfulness or Relaxation training provided Active  listening / Reflection utilized  Advance Care and HCPOA education provided Emotional Support Provided Problem [place] strategies reviewed Provided psychoeducation for mental health needs  Provided brief CBT  Reviewed mental health medications and discussed importance of compliance:  Quality of sleep assessed & Sleep Hygiene techniques promoted  Participation in counseling encouraged  Verbalization of feelings encouraged  Suicidal Ideation/Homicidal Ideation assessed: Patient denies SI/HI  Review resources, discussed options and provided patient information about  [place] care team collaboration (see longitudinal plan of care) Patient Goals/Self-Care Activities: Over the next 120 days Attend scheduled medical appointments Utilize healthy coping skills and supportive resources discussed Contact PCP with any questions or concerns Keep 90 percent of counseling appointments Call your insurance provider for more information about your Enhanced Benefits  Check out counseling resources provided  Begin personal counseling with LCSW, to reduce and manage symptoms of Depression and Stress, until well-established with mental health provider Accept all calls from representative with [place] in an effort to establish ongoing mental health counseling and supportive services. Incorporate into daily practice - relaxation techniques, deep breathing exercises, and mindfulness meditation strategies. Talk about feelings with friends, family members, spiritual advisor, etc. Contact LCSW directly [phone]), if you have questions, need assistance, or if additional social work needs are identified between now and our next scheduled telephone outreach call. Call [phone] for mental health hotline/crisis line if needed (24/7 available) Try techniques to reduce symptoms of anxiety/negative thinking (deep breathing, distraction, positive self talk, etc)  - develop  a personal safety plan - develop a plan to deal with triggers like holidays, anniversaries - exercise at least 2 to 3 times per week - have a plan for how to handle bad days - journal feelings and what helps to feel better or worse - spend time or talk with others at least 2 to 3 times per week - watch for early signs of feeling worse - begin personal counseling - call and visit an old friend - check out volunteer opportunities - join a support group - laugh; watch a funny movie or comedian - learn and use visualization or guided imagery - perform a random act of kindness - practice relaxation or meditation daily - start or continue a personal journal - practice positive thinking and self-talk -continue with compliance of taking medication  -identify current effective and ineffective coping strategies.  -implement positive self-talk in care to increase self-esteem, confidence and feelings of control.  -consider alternative and complementary therapy approaches such as meditation, mindfulness or yoga.  -journaling, prayer, worship services, meditation or pastoral counseling.  -increase participation in pleasurable group activities such as hobbies, singing, sports or  volunteering).  -consider the use of meditative movement therapy such as tai chi, yoga or qigong.  -start a regular daily exercise program based on tolerance, ability and patient choice to support positive thinking and activity    If you are experiencing a Mental Health or [place] or need someone to talk to, please call the Suicide and Crisis Lifeline: [place] Availability:    [facility]  [address] [place], [place] Crisis [phone]   [facility] [employer] [phone]   [place]  [phone]    [place]  [phone] (after hours)   Therapeutic Alternative/Mobile Crisis    [phone]   [place] National Suicide Hotline  (579)381-0976 (TALK) OR 988   Call 911 or go to emergency room   [facility] [facility]  [phone]);  [place] and [employer]  [phone]); [place], [place], [place], [place], [place], [place], [place]        10 LITTLE Things To Do When You're Feeling Too Down To Do Anything  Take a shower. Even if you plan to stay in all day long and not see a soul, take a shower. It takes the most effort to hop in to the shower but once you do, you'll feel immediate results. It will wake you up and you'll be feeling much fresher (and cleaner too).  Brush and floss your teeth. Give your teeth a good brushing with a floss finish. It's a small task but it feels so good and you can check 'taking care of your health' off the list of things to do.  Do something small on your list. Most of [place] have some small thing we would like to get done (load of laundry, sew a button, email a friend). Doing one of these things will make you feel like you've accomplished something.  Drink water. Drinking water is easy right? It's also really beneficial for your health so keep a glass beside you all day and take sips often. It gives you energy and prevents you from boredom eating.  Do some floor exercises. The last thing you want to do is exercise but it might be just the thing you need the most. Keep it simple and do exercises that involve sitting or laying on the floor. Even the smallest of exercises release chemicals in the brain that make you feel good. Yoga stretches or core exercises are going to make you feel good with minimal effort.  Make your bed. Making your bed takes a few minutes but it's productive and you'll feel relieved when it's done. An unmade bed is a huge visual reminder that you're having an unproductive day. Do it and consider it your housework for the day.  Put on some nice clothes. Take the sweatpants off even if you don't  plan to go anywhere. Put on clothes that make you feel good. Take a look in the mirror so your brain recognizes the sweatpants have been replaced with clothes that make you look great. It's an instant confidence booster.  Wash the dishes. A pile of dirty dishes in the sink is a reflection of your mood. It's possible that if you wash up the dishes, your mood will follow suit. It's worth a try.  Cook a real meal. If you have the luxury to have a "do nothing" day, you have time to make a real meal for yourself. Make  a meal that you love to eat. The process is good to get you out of the funk and the food will ensure you have more energy for tomorrow.  Write out your thoughts by hand. When you hand write, you stimulate your brain to focus on the moment that you're in so make yourself comfortable and write whatever comes into your mind. Put those thoughts out on paper so they stop spinning around in your head. Those thoughts might be the very thing holding you down.    Patient Goals: Follow up goal      Follow up:  Patient agrees to Care Plan and Follow-up.  Plan: The Managed Medicaid care management team will reach out to the patient again over the next 30 days.  Date/time of next scheduled Social Work care management/care coordination outreach:  10/25/22 at [place] am Eula Fried, BSW, MSW, [employer] Managed Medicaid LCSW [email or web address] Phone: [phone]

## 2022-10-17 NOTE — Patient Instructions (Signed)
Visit Information  Ms. Coverdale was given information about Medicaid Managed Care team care coordination services as a part of their [facility] Medicaid benefit. [name] verbally consented to engagement with the [facility] Managed Care team.   If you are experiencing a medical emergency, please call 911 or report to your local emergency department or urgent care.   If you have a non-emergency medical problem during routine business hours, please contact your provider's office and ask to speak with a nurse.   For questions related to your [facility] health plan, please call: [phone] or go here:[email or web address]  If you would like to schedule transportation through your [facility] plan, please call the following number at least 2 days in advance of your appointment: [phone].  You can also use the MTM portal or MTM mobile app to manage your rides. For the portal, please go to [email or web address].  Call the [employer] at [phone], at any time, 24 hours a day, 7 days a week. If you are in danger or need immediate medical attention call 911.  If you would like help to quit smoking, call 1-800-QUIT-NOW 279-884-2129) OR Espaol: 1-855-Djelo-Ya QO:409462) o para ms informacin haga clic aqu or Text READY to 200-400 to register via text   Following is a copy of your plan of care:  Care Plan : LCSW Plan of Care  Updates made by Greg Cutter, LCSW since 10/17/2022 12:00 AM     Problem: Anxiety Identification (Anxiety)      Long-Range Goal: Anxiety Symptoms Identified   Start Date: 10/06/2022  Note:   Priority: High  Timeframe:  Long-Range Goal Priority:  High Start Date:   10/06/22      Expected End Date:  ongoing                     Follow Up Date--10/25/22 at 10:45 am  - keep 90 percent of scheduled appointments -consider counseling or psychiatry -consider bumping up your self-care  -consider creating a stronger  support network   Why is this important?             Combatting depression may take some time.            If you don't feel better right away, don't give up on your treatment plan.    Current barriers:   Chronic Mental Health needs related to PTSD, depression, stress and anxiety. Patient requires Support, Education, Resources, Referrals, Advocacy, and Care Coordination, in order to meet Unmet Mental Health Needs. Patient has a history of trauma Patient will implement clinical interventions discussed today to decrease symptoms of depression and increase knowledge and/or ability of: coping skills. Mental Health Concerns and Social Isolation Patient lacks knowledge of available community counseling agencies and resources. Ongoing issues with insomnia   Clinical Goal(s): verbalize understanding of plan for management of PTSD, Anxiety, Depression, and Stress and demonstrate a reduction in symptoms. Patient will connect with a provider for ongoing mental health treatment, increase coping skills, healthy habits, self-management skills, and stress reduction       Patient Goals/Self-Care Activities: Over the next 120 days Attend scheduled medical appointments Utilize healthy coping skills and supportive resources discussed Contact PCP with any questions or concerns Keep 90 percent of counseling appointments Call your insurance provider for more information about your Enhanced Benefits  Check out counseling resources provided  Begin personal counseling with LCSW, to reduce and manage symptoms of Depression and Stress, until well-established with mental health  provider Accept all calls from representative with [place] in an effort to establish ongoing mental health counseling and supportive services. Incorporate into daily practice - relaxation techniques, deep breathing exercises, and mindfulness meditation strategies. Talk about feelings with friends, family members, spiritual advisor,  etc. Contact LCSW directly [phone]), if you have questions, need assistance, or if additional social work needs are identified between now and our next scheduled telephone outreach call. Call 988 for mental health hotline/crisis line if needed (24/7 available) Try techniques to reduce symptoms of anxiety/negative thinking (deep breathing, distraction, positive self talk, etc)  - develop a personal safety plan - develop a plan to deal with triggers like holidays, anniversaries - exercise at least 2 to 3 times per week - have a plan for how to handle bad days - journal feelings and what helps to feel better or worse - spend time or talk with others at least 2 to 3 times per week - watch for early signs of feeling worse - begin personal counseling - call and visit an old friend - check out volunteer opportunities - join a support group - laugh; watch a funny movie or comedian - learn and use visualization or guided imagery - perform a random act of kindness - practice relaxation or meditation daily - start or continue a personal journal - practice positive thinking and self-talk -continue with compliance of taking medication  -identify current effective and ineffective coping strategies.  -implement positive self-talk in care to increase self-esteem, confidence and feelings of control.  -consider alternative and complementary therapy approaches such as meditation, mindfulness or yoga.  -journaling, prayer, worship services, meditation or pastoral counseling.  -increase participation in pleasurable group activities such as hobbies, singing, sports or volunteering).  -consider the use of meditative movement therapy such as tai chi, yoga or qigong.  -start a regular daily exercise program based on tolerance, ability and patient choice to support positive thinking and activity    If you are experiencing a Mental Health or [place] or need someone to talk to, please call  the Suicide and Crisis Lifeline: [place] Availability:    [facility]  [address] [place], [place] Crisis [phone]   [facility] [employer] [phone]   [place]  [phone]    [place]  [phone] (after hours)   Therapeutic Alternative/Mobile Crisis   [phone]   [place] National Suicide Hotline  (332)674-3190 (TALK) OR 988   Call 911 or go to emergency room   [facility]  [phone]);  [place] and [employer]  [phone]); [place], [place], [place], [place], [place], [place], [place]        10 LITTLE Things To Do When You're Feeling Too Down To Do Anything  Take a shower. Even if you plan to stay in all day long and not see a soul, take a shower. It takes the most effort to hop in to the shower but once you do, you'll feel immediate results. It will wake you up and you'll be feeling much fresher (and cleaner too).  Brush and floss your teeth. Give your teeth a good brushing with a floss finish. It's a small task but it feels so good and you can check 'taking care of your health' off the list of things to do.  Do something small on your list. Most of [place] have some small  thing we would like to get done (load of laundry, sew a button, email a friend). Doing one of these things will make you feel like you've accomplished something.  Drink water. Drinking water is easy right? It's also really beneficial for your health so keep a glass beside you all day and take sips often. It gives you energy and prevents you from boredom eating.  Do some floor exercises. The last thing you want to do is exercise but it might be just the thing you need the most. Keep it simple and do exercises that involve sitting or laying on the floor. Even the smallest of exercises release chemicals in the brain that make you feel good.  Yoga stretches or core exercises are going to make you feel good with minimal effort.  Make your bed. Making your bed takes a few minutes but it's productive and you'll feel relieved when it's done. An unmade bed is a huge visual reminder that you're having an unproductive day. Do it and consider it your housework for the day.  Put on some nice clothes. Take the sweatpants off even if you don't plan to go anywhere. Put on clothes that make you feel good. Take a look in the mirror so your brain recognizes the sweatpants have been replaced with clothes that make you look great. It's an instant confidence booster.  Wash the dishes. A pile of dirty dishes in the sink is a reflection of your mood. It's possible that if you wash up the dishes, your mood will follow suit. It's worth a try.  Cook a real meal. If you have the luxury to have a "do nothing" day, you have time to make a real meal for yourself. Make a meal that you love to eat. The process is good to get you out of the funk and the food will ensure you have more energy for tomorrow.  Write out your thoughts by hand. When you hand write, you stimulate your brain to focus on the moment that you're in so make yourself comfortable and write whatever comes into your mind. Put those thoughts out on paper so they stop spinning around in your head. Those thoughts might be the very thing holding you down.    Patient Goals: Follow up goal

## 2022-10-19 ENCOUNTER — Encounter: Payer: Self-pay | Admitting: Radiology

## 2022-10-20 DIAGNOSIS — Z419 Encounter for procedure for purposes other than remedying health state, unspecified: Secondary | ICD-10-CM | POA: Diagnosis not present

## 2022-10-25 ENCOUNTER — Other Ambulatory Visit: Payer: Medicaid Other | Admitting: Licensed Clinical Social Worker

## 2022-10-25 NOTE — Patient Outreach (Signed)
  Medicaid Managed Care   Unsuccessful Attempt Note   10/25/2022 Name: Mary [name] MRN: [id number] DOB: 01/09/1981  Referred by: Coral Spikes, DO Reason for referral : No chief complaint on file.   An unsuccessful telephone outreach was attempted today. The patient was referred to the case management team for assistance with care management and care coordination.    Follow Up Plan: A HIPAA compliant phone message was left for the patient providing contact information and requesting a return call.   Eula Fried, BSW, MSW, [employer] Managed Medicaid LCSW [email or web address] Phone: [phone]

## 2022-10-27 ENCOUNTER — Other Ambulatory Visit: Payer: Medicaid Other | Admitting: *Deleted

## 2022-10-27 NOTE — Patient Outreach (Signed)
  Medicaid Managed Care   Unsuccessful Attempt Note   10/27/2022 Name: Mary [name] MRN: [id number] DOB: 1981-05-16  Referred by: Coral Spikes, DO Reason for referral : High Risk Managed Medicaid (Unsuccessful RNCM follow up telephone outreach)   An unsuccessful telephone outreach was attempted today. The patient was referred to the case management team for assistance with care management and care coordination.    Follow Up Plan: A HIPAA compliant phone message was left for the patient providing contact information and requesting a return call. and The Managed Medicaid care management team will reach out to the patient again over the next 7 days.    Lurena Joiner RN, BSN [place]  [employer] manager

## 2022-10-27 NOTE — Patient Instructions (Signed)
Visit Information  Ms. [name]  - as a part of your Medicaid benefit, you are eligible for care management and care coordination services at no cost or copay. I was unable to reach you by phone today but would be happy to help you with your health related needs. Please feel free to call me @ [phone].   A member of the Managed Medicaid care management team will reach out to you again over the next 7 days.   Lurena Joiner RN, BSN [place]  [employer] manager

## 2022-11-01 ENCOUNTER — Other Ambulatory Visit: Payer: Medicaid Other | Admitting: Licensed Clinical Social Worker

## 2022-11-01 NOTE — Patient Outreach (Signed)
Medicaid Managed Care Social Work Note  11/01/2022 Name:  Mary [name] MRN:  [id number] DOB:  [birth year]  Mary [name] is an 42 y.o. year old female who is a primary patient of Coral Spikes, DO.  The Medicaid Managed Care Coordination team was consulted for assistance with:  [place] and Resources  Mary [name] was given information about Medicaid Managed Care Coordination team services today. Mary [name] Patient agreed to services and verbal consent obtained.  Engaged with patient  for by telephone forfollow up visit in response to referral for case management and/or care coordination services.   Assessments/Interventions:  Review of past medical history, allergies, medications, health status, including review of consultants reports, laboratory and other test data, was performed as part of comprehensive evaluation and provision of chronic care management services.  SDOH: (Social Determinant of Health) assessments and interventions performed: SDOH Interventions    Flowsheet Row Patient Outreach Telephone from 11/01/2022 in [place] Patient Outreach Telephone from 10/06/2022 in [place] Patient Outreach Telephone from 08/28/2022 in [place] Telephone from 08/09/2022 in [place] Office Visit from 06/08/2022 in [place] Office Visit from 03/09/2022 in [place]  SDOH Interventions        Food Insecurity Interventions -- -- Other (Comment)  [BSW referral for local food pantries] -- -- --  Housing Interventions -- -- Intervention Not Indicated -- -- --  Transportation Interventions -- -- Intervention Not Indicated Intervention Not Indicated -- --  Utilities Interventions -- -- Intervention Not Indicated -- -- --  Depression Interventions/Treatment  -- Referral to Psychiatry, Medication  -- -- Medication, Referral to Psychiatry Currently on Treatment  Stress Interventions Offered [employer], Provide Counseling Offered [employer], Provide Counseling -- -- -- --       Advanced Directives Status:  See Care Plan for related entries.  Care Plan                 Allergies  Allergen Reactions   Buprenorphine Hcl Anaphylaxis    "it stops my heart"   Morphine Anaphylaxis    i quit breathing    Morphine And Related Anaphylaxis    "it stops my heart"   Penicillins Anaphylaxis and Other (See Comments)    Has patient had a PCN reaction causing immediate rash, facial/tongue/throat swelling, SOB or lightheadedness with hypotension: Yes Has patient had a PCN reaction causing severe rash involving mucus membranes or skin necrosis: Yes Has patient had a PCN reaction that required hospitalization: Yes Has patient had a PCN reaction occurring within the last 10 years: No If all of the above answers are "NO", then may proceed with Cephalosporin use.    Gabapentin Other (See Comments)    Per patient "hallucination"   Nsaids    Sulfa Antibiotics     Other reaction(s): Unknown   Ibuprofen-Acetaminophen Nausea And Vomiting   Latex Rash   Tylenol [Acetaminophen] Nausea And Vomiting    Medications Reviewed Today     Reviewed by Melissa Montane, RN (Registered Nurse) on 09/27/22 at 1447  Med List Status: <None>   Medication Order Taking? Sig Documenting Provider Last Dose Status Informant  albuterol (VENTOLIN HFA) 108 (90 Base) MCG/ACT inhaler [id number] Yes Inhale 2 puffs into the lungs every 6 (six) hours as needed for wheezing or shortness of breath. Coral Spikes, DO Taking Active   Atogepant Lenoria Chime)  10 MG TABS [id number] Yes Take 10 mg by mouth daily. Baruch Gouty, FNP Taking Active   celecoxib (CELEBREX) 200 MG capsule [id number] Yes Take 1 capsule (200 mg total) by mouth 2 (two) times daily. Carole Civil, MD Taking Active    Cholecalciferol (VITAMIN D3) 1000 units CAPS [id number] Yes Take 3,000 Units by mouth daily. [provider] Taking Active   citalopram (CELEXA) 20 MG tablet [id number] Yes Take by mouth. [provider] Taking Active   dapagliflozin propanediol (FARXIGA) 10 MG TABS tablet [id number] Yes Take 1 tablet (10 mg total) by mouth daily before breakfast. Eileen Stanford, PA-C Taking Active   DULERA 100-5 MCG/ACT AERO [id number] Yes INHALE 2 PUFFS TWICE DAILY Rakes, Connye Burkitt, FNP Taking Active   fluticasone (FLONASE) 50 MCG/ACT nasal spray [id number] Yes Place 1 spray into both nostrils daily. Baruch Gouty, FNP Taking Active   furosemide (LASIX) 20 MG tablet [id number] Yes Take 20 mg by mouth. [provider] Taking Active   hydrOXYzine (VISTARIL) 25 MG capsule [id number] Yes TAKE 1 CAPSULE EVERY 8 HOURS AS NEEDED Lacinda Axon, Jayce G, DO Taking Active   levocetirizine (XYZAL) 5 MG tablet [id number] Yes Take 1 tablet (5 mg total) by mouth every evening. Ameduite, Trenton Gammon, FNP Taking Active   losartan (COZAAR) 50 MG tablet [id number] Yes Take 1 tablet (50 mg total) by mouth daily. Baruch Gouty, FNP Taking Active   melatonin 3 MG TABS tablet [id number] Yes Take 10 mg by mouth at bedtime. [provider] Taking Active   omeprazole (PRILOSEC) 40 MG capsule [id number] Yes Take 40 mg by mouth daily. [provider] Taking Active   ondansetron (ZOFRAN-ODT) 4 MG disintegrating tablet [id number] Yes TAKE 1 TABLET EVERY 8 HOURS AS NEEDED FOR NAUSEA OR VOMITING Cook, Jayce G, DO Taking Active   polyethylene glycol (MIRALAX / GLYCOLAX) 17 g packet [id number] Yes Take 17 g by mouth daily. Ripley Fraise, MD Taking Active   pregabalin (LYRICA) 75 MG capsule [id number] Yes TAKE ONE CAPSULE TWICE DAILY Bayard Hugger, NP Taking Active   promethazine-dextromethorphan (PROMETHAZINE-DM) 6.25-15 MG/5ML syrup [id number] Yes Take 5 mLs by mouth 4 (four) times daily as needed. Coral Spikes, DO  Taking Active   spironolactone (ALDACTONE) 25 MG tablet [id number] Yes Take 12.5 mg by mouth daily. [provider] Taking Active   tirzepatide [facility]) 2.5 MG/0.5ML Pen [id number] No Inject 2.5 mg into the skin once a week.  Patient not taking: Reported on 07/07/2022   Ameduite, Trenton Gammon, FNP Not Taking Active   torsemide (DEMADEX) 20 MG tablet [id number] Yes TAKE ONE TABLET ONCE DAILY Coral Spikes, DO Taking Active   traZODone (DESYREL) 100 MG tablet [id number] Yes TAKE TWO TABLETS AT BEDTIME Coral Spikes, DO Taking Active             Patient Active Problem List   Diagnosis Date Noted   Cough 07/09/2022   OSA (obstructive sleep apnea) 03/16/2022   Chronic pain syndrome 05/24/2021   High risk medication use 05/24/2021   Allergic rhinitis 05/24/2021   Morbid obesity ([place]) 06/11/2020   Primary hypertension 06/11/2020   Migraine headache without aura 12/02/2019   History of cervical cancer 02/06/2018   Depression, recurrent ([place]) 02/06/2018   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   COPD (chronic obstructive pulmonary disease) ([place]) 02/03/2014   GAD (generalized anxiety disorder) 02/03/2014   PTSD (post-traumatic stress disorder) 02/03/2014    Conditions to be addressed/monitored per PCP order:  Anxiety  Care Plan : LCSW Plan of Care  Updates made by Greg Cutter, LCSW since 11/01/2022 12:00 AM     Problem: Anxiety Identification (Anxiety)      Long-Range Goal: Anxiety Symptoms Identified   Start Date: 10/06/2022  Note:   Priority: High  Timeframe:  Long-Range Goal Priority:  High Start Date:   10/06/22      Expected End Date:  ongoing                     Follow Up Date--11/20/22 at 2:30 pm  - keep 90 percent of scheduled appointments -consider counseling or psychiatry -consider bumping up your self-care  -consider creating a stronger support network   Why is this important?             Combatting depression may take some time.            If you don't feel better  right away, don't give up on your treatment plan.    Current barriers:   Chronic Mental Health needs related to addiction, PTSD, depression, stress and anxiety. Patient requires Support, Education, Resources, Referrals, Advocacy, and Care Coordination, in order to meet Unmet Mental Health Needs. Patient has a history of trauma and addiction Patient will implement clinical interventions discussed today to decrease symptoms of depression and increase knowledge and/or ability of: coping skills. Mental Health Concerns and Social Isolation Patient lacks knowledge of available community counseling agencies and resources. Ongoing issues with insomnia   Clinical Goal(s): verbalize understanding of plan for management of PTSD, Anxiety, Depression, and Stress and demonstrate a reduction in symptoms. Patient will connect with a provider for ongoing mental health treatment, increase coping skills, healthy habits, self-management skills, and stress reduction        Clinical Interventions:  Assessed patient's previous and current treatment, coping skills, support system and barriers to care. Patient provided hx  Verbalization of feelings encouraged, motivational interviewing employed Emotional support provided, positive coping strategies explored. Establishing healthy boundaries emphasized and healthy self-care education provided Patient was educated on available mental health resources within their area that accept Medicaid and offer counseling and psychiatry. Patient is agreeable to referral to [facility]  for counseling at [facility] and understands that an additional` referral will have to be made later once they start accepting new clients again for psychiatry. [facility] LCSW made referral for counseling only on 10/06/22. LCSW 10/17/22 update- [facility] LCSW spoke with [place] at [facility] staff and was informed that they are booked out until May of 2024. Patient was  provided this update but had to take another call that was coming in from her PCP office. [facility] LCSW sent email to patient with resources for her to review until next week's follow up social work appointment. [facility] LCSW 11/01/22 update- Patient reports that she has received great news recently that she will become a grandmother this year. She reports that she continues to remain free of illicit substance and alcohol use. Positive reinforcement provided for this successful self-care implementation. [facility] LCSW was able to communicate to [facility] and get patient scheduled for therapy. However, patient is still in need of finding a psychiatrist. Email was sent to patient with resources for her to review but patient is strongly considering going back to [place] as [place] in her county.  Email with crisis support resources and [facility]'s walk in clinic hours was sent as well  LCSW provided  education on relaxation techniques such as meditation, deep breathing, massage, grounding exercises or yoga that can activate the body's relaxation response and ease symptoms of stress and anxiety. LCSW ask that when pt is struggling with difficult emotions and racing thoughts that they start this relaxation response process. LCSW provided extensive education on healthy coping skills for anxiety. SW used active and reflective listening, validated patient's feelings/concerns, and provided emotional support. Patient will work on implementing appropriate self-care habits into their daily routine such as: staying positive, writing a gratitude list, drinking water, staying active around the house, taking their medications as prescribed, combating negative thoughts or emotions and staying connected with their family and friends. Positive reinforcement provided for this decision to work on this. LCSW provided education on healthy sleep hygiene and what that looks like. LCSW encouraged patient to implement a night time  routine into their schedule that works best for them and that they are able to maintain. Advised patient to implement deep breathing/grounding/meditation/self-care exercises into their nightly routine to combat racing thoughts at night. LCSW encouraged patient to wake up at the same time each day, make their sleeping environment comfortable, exercise when able, to limit naps and to not eat or drink anything right before bed.  Motivational Interviewing employed Depression screen reviewed  PHQ2/ PHQ9 completed or reviewed  Mindfulness or Relaxation training provided Active listening / Reflection utilized  Advance Care and HCPOA education provided Emotional Support Provided Problem Covenant Life strategies reviewed Provided psychoeducation for mental health needs  Provided brief CBT  Reviewed mental health medications and discussed importance of compliance:  Quality of sleep assessed & Sleep Hygiene techniques promoted  Participation in counseling encouraged  Verbalization of feelings encouraged  Suicidal Ideation/Homicidal Ideation assessed: Patient denies SI/HI  Review resources, discussed options and provided patient information about  [place] care team collaboration (see longitudinal plan of care) Patient Goals/Self-Care Activities: Over the next 120 days Attend scheduled medical appointments Utilize healthy coping skills and supportive resources discussed Contact PCP with any questions or concerns Keep 90 percent of counseling appointments Call your insurance provider for more information about your Enhanced Benefits  Check out counseling resources provided  Begin personal counseling with LCSW, to reduce and manage symptoms of Depression and Stress, until well-established with mental health provider Accept all calls from representative with [place] in an effort to establish ongoing mental health counseling and supportive  services. Incorporate into daily practice - relaxation techniques, deep breathing exercises, and mindfulness meditation strategies. Talk about feelings with friends, family members, spiritual advisor, etc. Contact LCSW directly [phone]), if you have questions, need assistance, or if additional social work needs are identified between now and our next scheduled telephone outreach call. Call [phone] for mental health hotline/crisis line if needed (24/7 available) Try techniques to reduce symptoms of anxiety/negative thinking (deep breathing, distraction, positive self talk, etc)  - develop a personal safety plan - develop a plan to deal with triggers like holidays, anniversaries - exercise at least 2 to 3 times per week - have a plan for how to handle bad days - journal feelings and what helps to feel better or worse - spend time or talk with others at least 2 to 3 times per week - watch for early signs of feeling worse - begin personal counseling - call and visit an old friend - check out volunteer opportunities - join a support group - laugh; watch a funny movie or comedian - learn and use visualization or  guided imagery - perform a random act of kindness - practice relaxation or meditation daily - start or continue a personal journal - practice positive thinking and self-talk -continue with compliance of taking medication  -identify current effective and ineffective coping strategies.  -implement positive self-talk in care to increase self-esteem, confidence and feelings of control.  -consider alternative and complementary therapy approaches such as meditation, mindfulness or yoga.  -journaling, prayer, worship services, meditation or pastoral counseling.  -increase participation in pleasurable group activities such as hobbies, singing, sports or volunteering).  -consider the use of meditative movement therapy such as tai chi, yoga or qigong.  -start a regular daily exercise program  based on tolerance, ability and patient choice to support positive thinking and activity    If you are experiencing a Mental Health or [place] or need someone to talk to, please call the Suicide and Crisis Lifeline: [place] Availability:    [facility]  [address] [place], [place] Crisis [phone]   [facility] [employer] [phone]   [place]  [phone]    [place]  [phone] (after hours)   Therapeutic Alternative/Mobile Crisis   [phone]   [place] National Suicide Hotline  (872) 112-7191 (TALK) OR 988   Call 911 or go to emergency room   [facility]  [phone]);  [place] and [employer]  [phone]); [place], [place], [place], [place], [place], [place], [place]        10 LITTLE Things To Do When You're Feeling Too Down To Do Anything  Take a shower. Even if you plan to stay in all day long and not see a soul, take a shower. It takes the most effort to hop in to the shower but once you do, you'll feel immediate results. It will wake you up and you'll be feeling much fresher (and cleaner too).  Brush and floss your teeth. Give your teeth a good brushing with a floss finish. It's a small task but it feels so good and you can check 'taking care of your health' off the list of things to do.  Do something small on your list. Most of [place] have some small thing we would like to get done (load of laundry, sew a button, email a friend). Doing one of these things will make you feel like you've accomplished something.  Drink water. Drinking water is easy right? It's also really beneficial for your health so keep a glass beside you all day and take sips often. It gives you energy and prevents you from boredom eating.  Do some floor exercises. The last thing you want to do is exercise but it might be  just the thing you need the most. Keep it simple and do exercises that involve sitting or laying on the floor. Even the smallest of exercises release chemicals in the brain that make you feel good. Yoga stretches or core exercises are going to make you feel good with minimal effort.  Make your bed. Making your bed takes a few minutes but it's productive and you'll feel relieved when it's done. An unmade bed is a huge visual reminder that you're having an unproductive day. Do it and consider it your housework for the day.  Put on some nice clothes. Take the sweatpants off even if you don't plan to go anywhere. Put on clothes that make you feel  good. Take a look in the mirror so your brain recognizes the sweatpants have been replaced with clothes that make you look great. It's an instant confidence booster.  Wash the dishes. A pile of dirty dishes in the sink is a reflection of your mood. It's possible that if you wash up the dishes, your mood will follow suit. It's worth a try.  Cook a real meal. If you have the luxury to have a "do nothing" day, you have time to make a real meal for yourself. Make a meal that you love to eat. The process is good to get you out of the funk and the food will ensure you have more energy for tomorrow.  Write out your thoughts by hand. When you hand write, you stimulate your brain to focus on the moment that you're in so make yourself comfortable and write whatever comes into your mind. Put those thoughts out on paper so they stop spinning around in your head. Those thoughts might be the very thing holding you down.    Patient Goals: Follow up goal     Follow up:  Patient agrees to Care Plan and Follow-up.  Plan: The Managed Medicaid care management team will reach out to the patient again over the next 30 days.  Date/time of next scheduled Social Work care management/care coordination outreach:  11/20/22 at 230 pm  Eula Fried, [place], MSW, [place] Medicaid  [email or web address] Phone: [phone]

## 2022-11-01 NOTE — Patient Instructions (Signed)
Visit Information  Mary [name] was given information about Medicaid Managed Care team care coordination services as a part of their [facility] Medicaid benefit. Mary [name] verbally consented to engagement with the [facility] Managed Care team.   If you are experiencing a medical emergency, please call 911 or report to your local emergency department or urgent care.   If you have a non-emergency medical problem during routine business hours, please contact your provider's office and ask to speak with a nurse.   For questions related to your [facility] health plan, please call: [phone] or go here:[email or web address]  If you would like to schedule transportation through your [facility] plan, please call the following number at least 2 days in advance of your appointment: [phone].  You can also use the MTM portal or MTM mobile app to manage your rides. For the portal, please go to [email or web address].  Call the [place] at [phone], at any time, 24 hours a day, 7 days a week. If you are in danger or need immediate medical attention call 911.  If you would like help to quit smoking, call 1-800-QUIT-NOW (281)322-4002) OR Espaol: 1-855-Djelo-Ya QO:409462) o para ms informacin haga clic aqu or Text READY to 200-400 to register via text   Following is a copy of your plan of care:  Care Plan : LCSW Plan of Care  Updates made by Mary Cutter, LCSW since 11/01/2022 12:00 AM     Problem: Anxiety Identification (Anxiety)      Long-Range Goal: Anxiety Symptoms Identified   Start Date: 10/06/2022  Note:   Priority: High  Timeframe:  Long-Range Goal Priority:  High Start Date:   10/06/22      Expected End Date:  ongoing                     Follow Up Date--10/25/22 at 10:45 am  - keep 90 percent of scheduled appointments -consider counseling or psychiatry -consider bumping up your self-care  -consider creating a stronger  support network   Why is this important?             Combatting depression may take some time.            If you don't feel better right away, don't give up on your treatment plan.    Current barriers:   Chronic Mental Health needs related to PTSD, depression, stress and anxiety. Patient requires Support, Education, Resources, Referrals, Advocacy, and Care Coordination, in order to meet Unmet Mental Health Needs. Patient has a history of trauma Patient will implement clinical interventions discussed today to decrease symptoms of depression and increase knowledge and/or ability of: coping skills. Mental Health Concerns and Social Isolation Patient lacks knowledge of available community counseling agencies and resources. Ongoing issues with insomnia   Clinical Goal(s): verbalize understanding of plan for management of PTSD, Anxiety, Depression, and Stress and demonstrate a reduction in symptoms. Patient will connect with a provider for ongoing mental health treatment, increase coping skills, healthy habits, self-management skills, and stress reduction      Patient Goals/Self-Care Activities: Over the next 120 days Attend scheduled medical appointments Utilize healthy coping skills and supportive resources discussed Contact PCP with any questions or concerns Keep 90 percent of counseling appointments Call your insurance provider for more information about your Enhanced Benefits  Check out counseling resources provided  Begin personal counseling with LCSW, to reduce and manage symptoms of Depression and Stress, until well-established with mental health provider  Accept all calls from representative with [place] in an effort to establish ongoing mental health counseling and supportive services. Incorporate into daily practice - relaxation techniques, deep breathing exercises, and mindfulness meditation strategies. Talk about feelings with friends, family members, spiritual advisor,  etc. Contact LCSW directly [phone]), if you have questions, need assistance, or if additional social work needs are identified between now and our next scheduled telephone outreach call. Call 988 for mental health hotline/crisis line if needed (24/7 available) Try techniques to reduce symptoms of anxiety/negative thinking (deep breathing, distraction, positive self talk, etc)  - develop a personal safety plan - develop a plan to deal with triggers like holidays, anniversaries - exercise at least 2 to 3 times per week - have a plan for how to handle bad days - journal feelings and what helps to feel better or worse - spend time or talk with others at least 2 to 3 times per week - watch for early signs of feeling worse - begin personal counseling - call and visit an old friend - check out volunteer opportunities - join a support group - laugh; watch a funny movie or comedian - learn and use visualization or guided imagery - perform a random act of kindness - practice relaxation or meditation daily - start or continue a personal journal - practice positive thinking and self-talk -continue with compliance of taking medication  -identify current effective and ineffective coping strategies.  -implement positive self-talk in care to increase self-esteem, confidence and feelings of control.  -consider alternative and complementary therapy approaches such as meditation, mindfulness or yoga.  -journaling, prayer, worship services, meditation or pastoral counseling.  -increase participation in pleasurable group activities such as hobbies, singing, sports or volunteering).  -consider the use of meditative movement therapy such as tai chi, yoga or qigong.  -start a regular daily exercise program based on tolerance, ability and patient choice to support positive thinking and activity    If you are experiencing a Mental Health or [place] or need someone to talk to, please call  the Suicide and Crisis Lifeline: [place] Availability:    [facility]  [address] [place], [place] Crisis [phone]   [facility] the [employer] [phone]   [place]  [phone]    [place]  [phone] (after hours)   Therapeutic Alternative/Mobile Crisis   [phone]   [place] National Suicide Hotline  223-684-4065 (TALK) OR 988   Call 911 or go to emergency room   [facility]  [phone]);  [place] and [employer]  [phone]); [place], [place], [place], [place], [place], [place], [place]        10 LITTLE Things To Do When You're Feeling Too Down To Do Anything  Take a shower. Even if you plan to stay in all day long and not see a soul, take a shower. It takes the most effort to hop in to the shower but once you do, you'll feel immediate results. It will wake you up and you'll be feeling much fresher (and cleaner too).  Brush and floss your teeth. Give your teeth a good brushing with a floss finish. It's a small task but it feels so good and you can check 'taking care of your health' off the list of things to do.  Do something small on your list. Most of [place] have some small thing  we would like to get done (load of laundry, sew a button, email a friend). Doing one of these things will make you feel like you've accomplished something.  Drink water. Drinking water is easy right? It's also really beneficial for your health so keep a glass beside you all day and take sips often. It gives you energy and prevents you from boredom eating.  Do some floor exercises. The last thing you want to do is exercise but it might be just the thing you need the most. Keep it simple and do exercises that involve sitting or laying on the floor. Even the smallest of exercises release chemicals in the brain that make you feel good.  Yoga stretches or core exercises are going to make you feel good with minimal effort.  Make your bed. Making your bed takes a few minutes but it's productive and you'll feel relieved when it's done. An unmade bed is a huge visual reminder that you're having an unproductive day. Do it and consider it your housework for the day.  Put on some nice clothes. Take the sweatpants off even if you don't plan to go anywhere. Put on clothes that make you feel good. Take a look in the mirror so your brain recognizes the sweatpants have been replaced with clothes that make you look great. It's an instant confidence booster.  Wash the dishes. A pile of dirty dishes in the sink is a reflection of your mood. It's possible that if you wash up the dishes, your mood will follow suit. It's worth a try.  Cook a real meal. If you have the luxury to have a "do nothing" day, you have time to make a real meal for yourself. Make a meal that you love to eat. The process is good to get you out of the funk and the food will ensure you have more energy for tomorrow.  Write out your thoughts by hand. When you hand write, you stimulate your brain to focus on the moment that you're in so make yourself comfortable and write whatever comes into your mind. Put those thoughts out on paper so they stop spinning around in your head. Those thoughts might be the very thing holding you down.    Patient Goals: Initial goal

## 2022-11-06 DIAGNOSIS — G4733 Obstructive sleep apnea (adult) (pediatric): Secondary | ICD-10-CM | POA: Diagnosis not present

## 2022-11-09 DIAGNOSIS — I10 Essential (primary) hypertension: Secondary | ICD-10-CM | POA: Diagnosis not present

## 2022-11-10 ENCOUNTER — Other Ambulatory Visit: Payer: Medicaid Other | Admitting: *Deleted

## 2022-11-10 ENCOUNTER — Ambulatory Visit: Payer: Medicaid Other | Admitting: *Deleted

## 2022-11-10 ENCOUNTER — Encounter: Payer: Self-pay | Admitting: *Deleted

## 2022-11-10 NOTE — Patient Outreach (Signed)
Medicaid Managed Care   Nurse Care Manager Note  11/10/2022 Name:  Mary [name] MRN:  [id number] DOB:  1981/04/25  Mary [name] is an 42 y.o. year old female who is a primary patient of Coral Spikes, DO.  The [facility] Managed Care Coordination team was consulted for assistance with:    HTN COPD Weight Loss  Mary [name] was given information about Medicaid Managed Care Coordination team services today. Mary [name] Patient agreed to services and verbal consent obtained.  Engaged with patient by telephone for follow up visit in response to provider referral for case management and/or care coordination services.   Assessments/Interventions:  Review of past medical history, allergies, medications, health status, including review of consultants reports, laboratory and other test data, was performed as part of comprehensive evaluation and provision of chronic care management services.  SDOH (Social Determinants of Health) assessments and interventions performed: SDOH Interventions    Flowsheet Row Patient Outreach Telephone from 11/01/2022 in [place] Patient Outreach Telephone from 10/06/2022 in [place] Patient Outreach Telephone from 08/28/2022 in [place] Telephone from 08/09/2022 in [place] Office Visit from 06/08/2022 in [place] Office Visit from 03/09/2022 in [facility]  SDOH Interventions        Food Insecurity Interventions -- -- Other (Comment)  [BSW referral for local food pantries] -- -- --  Housing Interventions -- -- Intervention Not Indicated -- -- --  Transportation Interventions -- -- Intervention Not Indicated Intervention Not Indicated -- --  Utilities Interventions -- -- Intervention Not Indicated -- -- --  Depression Interventions/Treatment  -- Referral to Psychiatry,  Medication -- -- Medication, Referral to Psychiatry Currently on Treatment  Stress Interventions Offered [employer], Provide Counseling Offered [employer] Resources, Provide Counseling -- -- -- --       Care Plan  Allergies  Allergen Reactions   Buprenorphine Hcl Anaphylaxis    "it stops my heart"   Morphine Anaphylaxis    i quit breathing    Morphine And Related Anaphylaxis    "it stops my heart"   Penicillins Anaphylaxis and Other (See Comments)    Has patient had a PCN reaction causing immediate rash, facial/tongue/throat swelling, SOB or lightheadedness with hypotension: Yes Has patient had a PCN reaction causing severe rash involving mucus membranes or skin necrosis: Yes Has patient had a PCN reaction that required hospitalization: Yes Has patient had a PCN reaction occurring within the last 10 years: No If all of the above answers are "NO", then may proceed with Cephalosporin use.    Gabapentin Other (See Comments)    Per patient "hallucination"   Nsaids    Sulfa Antibiotics     Other reaction(s): Unknown   Ibuprofen-Acetaminophen Nausea And Vomiting   Latex Rash   Tylenol [Acetaminophen] Nausea And Vomiting    Medications Reviewed Today     Reviewed by Melissa Montane, RN (Registered Nurse) on 11/10/22 at 1532  Med List Status: <None>   Medication Order Taking? Sig Documenting Provider Last Dose Status Informant  albuterol (VENTOLIN HFA) 108 (90 Base) MCG/ACT inhaler [id number] Yes Inhale 2 puffs into the lungs every 6 (six) hours as needed for wheezing or shortness of breath. Coral Spikes, DO Taking Active   Atogepant (QULIPTA) 10 MG TABS [id number] Yes Take 10 mg by mouth daily. Baruch Gouty, FNP Taking Active   celecoxib (CELEBREX) 200  MG capsule [id number] Yes Take 1 capsule (200 mg total) by mouth 2 (two) times daily. Carole Civil, MD Taking Active   Cholecalciferol (VITAMIN D3) 1000 units CAPS [id number] Yes Take 3,000 Units by  mouth daily. [provider] Taking Active   citalopram (CELEXA) 20 MG tablet [id number] Yes Take by mouth. [provider] Taking Active   dapagliflozin propanediol (FARXIGA) 10 MG TABS tablet [id number] Yes Take 1 tablet (10 mg total) by mouth daily before breakfast. Eileen Stanford, PA-C Taking Active   DULERA 100-5 MCG/ACT AERO [id number] Yes INHALE 2 PUFFS TWICE DAILY Rakes, Connye Burkitt, FNP Taking Active   fluticasone (FLONASE) 50 MCG/ACT nasal spray [id number] Yes Place 1 spray into both nostrils daily. Baruch Gouty, FNP Taking Active   furosemide (LASIX) 20 MG tablet [id number] Yes Take 20 mg by mouth. [provider] Taking Active   hydrOXYzine (VISTARIL) 25 MG capsule [id number] Yes TAKE 1 CAPSULE EVERY 8 HOURS AS NEEDED Lacinda Axon, Jayce G, DO Taking Active   levocetirizine (XYZAL) 5 MG tablet [id number] Yes Take 1 tablet (5 mg total) by mouth every evening. Ameduite, Trenton Gammon, FNP Taking Active   losartan (COZAAR) 50 MG tablet [id number] Yes Take 1 tablet (50 mg total) by mouth daily. Baruch Gouty, FNP Taking Active   melatonin 3 MG TABS tablet [id number] Yes Take 10 mg by mouth at bedtime. [provider] Taking Active   omeprazole (PRILOSEC) 40 MG capsule [id number] Yes Take 40 mg by mouth daily. [provider] Taking Active   ondansetron (ZOFRAN-ODT) 4 MG disintegrating tablet [id number] Yes TAKE 1 TABLET EVERY 8 HOURS AS NEEDED FOR NAUSEA OR VOMITING Cook, Jayce G, DO Taking Active   polyethylene glycol (MIRALAX / GLYCOLAX) 17 g packet [id number] Yes Take 17 g by mouth daily. Ripley Fraise, MD Taking Active   pregabalin (LYRICA) 75 MG capsule [id number] Yes TAKE ONE CAPSULE TWICE DAILY Bayard Hugger, NP Taking Active   promethazine-dextromethorphan (PROMETHAZINE-DM) 6.25-15 MG/5ML syrup [id number] Yes Take 5 mLs by mouth 4 (four) times daily as needed. Coral Spikes, DO Taking Active   spironolactone (ALDACTONE) 25 MG tablet [id number] Yes Take 12.5  mg by mouth daily. [provider] Taking Active   tirzepatide [facility]) 2.5 MG/0.5ML Pen [id number] No Inject 2.5 mg into the skin once a week.  Patient not taking: Reported on 07/07/2022   Ameduite, Trenton Gammon, FNP Not Taking Active   torsemide (DEMADEX) 20 MG tablet [id number] Yes TAKE ONE TABLET ONCE DAILY Coral Spikes, DO Taking Active   traZODone (DESYREL) 100 MG tablet [id number] Yes TAKE TWO TABLETS AT BEDTIME Coral Spikes, DO Taking Active             Patient Active Problem List   Diagnosis Date Noted   Cough 07/09/2022   OSA (obstructive sleep apnea) 03/16/2022   Chronic pain syndrome 05/24/2021   High risk medication use 05/24/2021   Allergic rhinitis 05/24/2021   Morbid obesity ([place]) 06/11/2020   Primary hypertension 06/11/2020   Migraine headache without aura 12/02/2019   History of cervical cancer 02/06/2018   Depression, recurrent ([place]) 02/06/2018   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   COPD (chronic obstructive pulmonary disease) ([place]) 02/03/2014   GAD (generalized anxiety disorder) 02/03/2014   PTSD (post-traumatic stress disorder) 02/03/2014    Conditions to be addressed/monitored per PCP order:  HTN, COPD, and weight loss  Care Plan : RN Care Manager Plan of Care  Updates made by Melissa Montane, RN since  11/10/2022 12:00 AM     Problem: Health Management needs related to Weight loss and COPD      Long-Range Goal: Development of Plan of Car to address Health Management needs related to Weight loss and COPD   Start Date: 08/28/2022  Expected End Date: 11/26/2022  Note:   Current Barriers:  Chronic Disease Management support and education needs related to COPD and Weight Loss Patient is has been dealing with a sinus issue for several weeks. She is not eligible for bariatric surgery and would like to explore other ideas for weight loss.   RNCM Clinical Goal(s):  Patient will verbalize understanding of plan for management of COPD and weight loss as evidenced by  patient reports take all medications exactly as prescribed and will call provider for medication related questions as evidenced by patient reports    attend all scheduled medical appointments: schedule follow up with Pulmonology, PCP on 01/08/23 as evidenced by provider documentation        work with Education officer, museum to address Financial constraints related to utilities, Limited access to food, and applying for disability related to the management of COPD and weight loss as evidenced by review of EMR and patient or Education officer, museum report     through collaboration with Consulting civil engineer, provider, and care team.   Interventions: Inter-disciplinary care team collaboration (see longitudinal plan of care) Evaluation of current treatment plan related to  self management and patient's adherence to plan as established by provider Reviewed provider notes and discussed with patient    Hypertension Interventions:  (Status:  New goal.) Long Term Goal -Recent BP 177/97 Last practice recorded BP readings:  BP Readings from Last 3 Encounters:  08/07/22 (!) 148/86  07/07/22 134/88  06/20/22 139/84  Most recent eGFR/CrCl:  Lab Results  Component Value Date   EGFR 73 03/09/2022    No components found for: "CRCL"  Evaluation of current treatment plan related to hypertension self management and patient's adherence to plan as established by provider Reviewed medications with patient and discussed importance of compliance Counseled on the importance of exercise goals with target of 150 minutes per week Advised patient, providing education and rationale, to monitor blood pressure daily and record, calling PCP for findings outside established parameters Reviewed scheduled/upcoming provider appointments including:  Advised patient to check BP daily, keep a journal of BP readings and take with her to her next PCP visit  COPD: (Status: Goal on Track (progressing): YES.) Long Term Goal  Reviewed medications with  patient, including use of prescribed maintenance and rescue inhalers, and provided instruction on medication management and the importance of adherence Advised patient to track and manage COPD triggers Advised patient to self assesses COPD action plan zone and make appointment with provider if in the yellow zone for 48 hours without improvement Provided education about and advised patient to utilize infection prevention strategies to reduce risk of respiratory infection Discussed the importance of adequate rest and management of fatigue with COPD Assessed social determinant of health barriers Discussed infection prevention, avoiding triggers Provided patient with Dr. Bari Mantis office number [phone]  Weight Loss:  (Status: Goal on Track (progressing): YES.)  Offered to connect patient with psychology or social work support for counseling and supportive care;  Reviewed recommended dietary changes: avoid fad diets, make small/incremental dietary and exercise changes, eat at the table and avoid eating in front of the TV, plan management of cravings, monitor snacking and cravings in food diary; Assessed social determinant of health barriers;  Provided encouragement and therapeutic listening Discussed referral to Nutrition-patient already having virtual visits with Nutrition Advised to discuss weight loss plan with PCP-patient is not eligible for bariatric surgery  Patient Goals/Self-Care Activities: Take medications as prescribed   Attend all scheduled provider appointments Call provider office for new concerns or questions  Work with the social worker to address care coordination needs and will continue to work with the clinical team to address health care and disease management related needs       Follow Up:  Patient agrees to Care Plan and Follow-up.  Plan: The Managed Medicaid care management team will reach out to the patient again over the next 60 days.  Date/time of next scheduled  RN care management/care coordination outreach:  01/09/23 @ 3:30pm  Lurena Joiner RN, [place] [facility] RN Care Coordinator [phone]

## 2022-11-10 NOTE — Patient Instructions (Signed)
Visit Information  Mary [name] was given information about Medicaid Managed Care team care coordination services as a part of their [facility] Medicaid benefit. Mary [name] verbally consented to engagement with the [facility] Managed Care team.   If you are experiencing a medical emergency, please call 911 or report to your local emergency department or urgent care.   If you have a non-emergency medical problem during routine business hours, please contact your provider's office and ask to speak with a nurse.   For questions related to your [facility] health plan, please call: [phone] or go here:[email or web address]  If you would like to schedule transportation through your [facility] plan, please call the following number at least 2 days in advance of your appointment: [phone].  You can also use the MTM portal or MTM mobile app to manage your rides. For the portal, please go to [email or web address].  Call the [place] at [phone], at any time, 24 hours a day, 7 days a week. If you are in danger or need immediate medical attention call 911.  If you would like help to quit smoking, call 1-800-QUIT-NOW 670-321-2396) OR Espaol: 1-855-Djelo-Ya (9-702-637-8588) o para ms informacin haga clic aqu or Text READY to 200-400 to register via text  Mary [name],   Please see education materials related to HTN and COPD provided as print materials.   The patient verbalized understanding of instructions, educational materials, and care plan provided today and agreed to receive a mailed copy of patient instructions, educational materials, and care plan.   Telephone follow up appointment with Managed Medicaid care management team member scheduled for:01/09/23 @ 3:30pm  Mary Joiner RN, Mary [name] Mary [name] [phone]   Following is a copy of your plan of care:  Care Plan : RN Care Manager Plan of Care   Updates made by Mary Montane, RN since 11/10/2022 12:00 AM     Problem: Health Management needs related to Weight loss and COPD      Long-Range Goal: Development of Plan of Car to address Health Management needs related to Weight loss and COPD   Start Date: 08/28/2022  Expected End Date: 11/26/2022  Note:   Current Barriers:  Chronic Disease Management support and education needs related to COPD and Weight Loss Patient is has been dealing with a sinus issue for several weeks. She is not eligible for bariatric surgery and would like to explore other ideas for weight loss.   RNCM Clinical Goal(s):  Patient will verbalize understanding of plan for management of COPD and weight loss as evidenced by patient reports take all medications exactly as prescribed and will call provider for medication related questions as evidenced by patient reports    attend all scheduled medical appointments: schedule follow up with Pulmonology, PCP on 01/08/23 as evidenced by provider documentation        work with Education officer, museum to address Financial constraints related to utilities, Limited access to food, and applying for disability related to the management of COPD and weight loss as evidenced by review of EMR and patient or Education officer, museum report     through collaboration with Consulting civil engineer, provider, and care team.   Interventions: Inter-disciplinary care team collaboration (see longitudinal plan of care) Evaluation of current treatment plan related to  self management and patient's adherence to plan as established by provider Reviewed provider notes and discussed with patient    Hypertension Interventions:  (Status:  New goal.) Long Term  Goal -Recent BP 177/97 Last practice recorded BP readings:  BP Readings from Last 3 Encounters:  08/07/22 (!) 148/86  07/07/22 134/88  06/20/22 139/84  Most recent eGFR/CrCl:  Lab Results  Component Value Date   EGFR 73 03/09/2022    No components found for:  "CRCL"  Evaluation of current treatment plan related to hypertension self management and patient's adherence to plan as established by provider Reviewed medications with patient and discussed importance of compliance Counseled on the importance of exercise goals with target of 150 minutes per week Advised patient, providing education and rationale, to monitor blood pressure daily and record, calling PCP for findings outside established parameters Reviewed scheduled/upcoming provider appointments including:  Advised patient to check BP daily, keep a journal of BP readings and take with her to her next PCP visit  COPD: (Status: Goal on Track (progressing): YES.) Long Term Goal  Reviewed medications with patient, including use of prescribed maintenance and rescue inhalers, and provided instruction on medication management and the importance of adherence Advised patient to track and manage COPD triggers Advised patient to self assesses COPD action plan zone and make appointment with provider if in the yellow zone for 48 hours without improvement Provided education about and advised patient to utilize infection prevention strategies to reduce risk of respiratory infection Discussed the importance of adequate rest and management of fatigue with COPD Assessed social determinant of health barriers Discussed infection prevention, avoiding triggers Provided patient with Mary [name] office number [phone]  Weight Loss:  (Status: Goal on Track (progressing): YES.)  Offered to connect patient with psychology or social work support for counseling and supportive care;  Reviewed recommended dietary changes: avoid fad diets, make small/incremental dietary and exercise changes, eat at the table and avoid eating in front of the TV, plan management of cravings, monitor snacking and cravings in food diary; Assessed social determinant of health barriers;  Provided encouragement and therapeutic  listening Discussed referral to Nutrition-patient already having virtual visits with Nutrition Advised to discuss weight loss plan with PCP-patient is not eligible for bariatric surgery  Patient Goals/Self-Care Activities: Take medications as prescribed   Attend all scheduled provider appointments Call provider office for new concerns or questions  Work with the social worker to address care coordination needs and will continue to work with the clinical team to address health care and disease management related needs

## 2022-11-14 ENCOUNTER — Other Ambulatory Visit: Payer: Self-pay | Admitting: Registered Nurse

## 2022-11-14 ENCOUNTER — Emergency Department (HOSPITAL_COMMUNITY): Payer: Medicaid Other

## 2022-11-14 ENCOUNTER — Emergency Department (HOSPITAL_COMMUNITY)
Admission: EM | Admit: 2022-11-14 | Discharge: 2022-11-14 | Disposition: A | Discharge status: Home / Self Care | Payer: Medicaid Other | Attending: Emergency Medicine | Admitting: Emergency Medicine

## 2022-11-14 ENCOUNTER — Other Ambulatory Visit: Payer: Self-pay | Admitting: Family Medicine

## 2022-11-14 ENCOUNTER — Encounter (HOSPITAL_COMMUNITY): Payer: Self-pay | Admitting: Emergency Medicine

## 2022-11-14 ENCOUNTER — Other Ambulatory Visit: Payer: Self-pay

## 2022-11-14 DIAGNOSIS — R2243 Localized swelling, mass and lump, lower limb, bilateral: Secondary | ICD-10-CM | POA: Diagnosis not present

## 2022-11-14 DIAGNOSIS — R079 Chest pain, unspecified: Secondary | ICD-10-CM | POA: Diagnosis not present

## 2022-11-14 DIAGNOSIS — I10 Essential (primary) hypertension: Secondary | ICD-10-CM | POA: Insufficient documentation

## 2022-11-14 DIAGNOSIS — M25472 Effusion, left ankle: Secondary | ICD-10-CM

## 2022-11-14 DIAGNOSIS — I509 Heart failure, unspecified: Secondary | ICD-10-CM | POA: Diagnosis not present

## 2022-11-14 DIAGNOSIS — R0789 Other chest pain: Secondary | ICD-10-CM | POA: Diagnosis not present

## 2022-11-14 DIAGNOSIS — R519 Headache, unspecified: Secondary | ICD-10-CM | POA: Insufficient documentation

## 2022-11-14 DIAGNOSIS — J301 Allergic rhinitis due to pollen: Secondary | ICD-10-CM

## 2022-11-14 DIAGNOSIS — Z79899 Other long term (current) drug therapy: Secondary | ICD-10-CM | POA: Diagnosis not present

## 2022-11-14 DIAGNOSIS — Z91040 Latex allergy status: Secondary | ICD-10-CM | POA: Insufficient documentation

## 2022-11-14 LAB — TROPONIN I (HIGH SENSITIVITY)
Troponin I (High Sensitivity): 2 ng/L (ref <18)
Troponin I (High Sensitivity): 2 ng/L (ref <18)

## 2022-11-14 LAB — BASIC METABOLIC PANEL
Anion gap: 12 (ref 5–15)
BUN: 14 mg/dL (ref 6–20)
CO2: 31 mmol/L (ref 22–32)
Calcium: 9.3 mg/dL (ref 8.9–10.3)
Chloride: 97 mmol/L — ABNORMAL LOW (ref 98–111)
Creatinine, Ser: 1.14 mg/dL — ABNORMAL HIGH (ref 0.44–1.00)
GFR, Estimated: >60 mL/min (ref >60)
Glucose, Bld: 101 mg/dL — ABNORMAL HIGH (ref 70–99)
Potassium: 3.7 mmol/L (ref 3.5–5.1)
Sodium: 140 mmol/L (ref 135–145)

## 2022-11-14 LAB — CBC
HCT: 42.4 % (ref 36.0–46.0)
Hemoglobin: 14 g/dL (ref 12.0–15.0)
MCH: 28.6 pg (ref 26.0–34.0)
MCHC: 33 g/dL (ref 30.0–36.0)
MCV: 86.5 fL (ref 80.0–100.0)
Platelets: 233 10*3/uL (ref 150–400)
RBC: 4.9 MIL/uL (ref 3.87–5.11)
RDW: 14.1 % (ref 11.5–15.5)
WBC: 9.9 10*3/uL (ref 4.0–10.5)
nRBC: 0 % (ref 0.0–0.2)

## 2022-11-14 LAB — BRAIN NATRIURETIC PEPTIDE: B Natriuretic Peptide: 38 pg/mL (ref 0.0–100.0)

## 2022-11-14 MED ORDER — DEXAMETHASONE SODIUM PHOSPHATE 10 MG/ML IJ SOLN
8.0000 mg | Freq: Once | INTRAMUSCULAR | Status: AC
  Administered 2022-11-14: 8 mg via INTRAVENOUS
  Filled 2022-11-14: qty 1

## 2022-11-14 MED ORDER — FENTANYL CITRATE PF 50 MCG/ML IJ SOSY
50.0000 ug | PREFILLED_SYRINGE | Freq: Once | INTRAMUSCULAR | Status: AC
  Administered 2022-11-14: 50 ug via INTRAVENOUS
  Filled 2022-11-14: qty 1

## 2022-11-14 MED ORDER — METOCLOPRAMIDE HCL 5 MG/ML IJ SOLN
10.0000 mg | Freq: Once | INTRAMUSCULAR | Status: AC
  Administered 2022-11-14: 10 mg via INTRAVENOUS
  Filled 2022-11-14: qty 2

## 2022-11-14 MED ORDER — DEXAMETHASONE SODIUM PHOSPHATE 10 MG/ML IJ SOLN: 8.0000 mg | Freq: Once | INTRAMUSCULAR | Status: DC

## 2022-11-14 MED ORDER — DIPHENHYDRAMINE HCL 50 MG/ML IJ SOLN
25.0000 mg | Freq: Once | INTRAMUSCULAR | Status: AC
  Administered 2022-11-14: 25 mg via INTRAVENOUS
  Filled 2022-11-14: qty 1

## 2022-11-14 NOTE — ED Triage Notes (Signed)
Pt with c/o Right sided headache and Rt sided chest pain that started today. States BP reading at home was 240/182.

## 2022-11-14 NOTE — ED Provider Notes (Signed)
[facility] Provider Note   CSN: [id number] Arrival date & time: 11/14/22  1943     History  Chief Complaint  Patient presents with   Hypertension   Headache   Chest Pain    Mary [name] is a 41 y.o. female, history of CHF, who presents to the ED secondary to right-sided chest discomfort, and right-sided headache that is more on for the past day.  She states she woke up this a.m., and had a severe right-sided headache, and right-sided chest discomfort.  Felt very pressurized, like someone is sitting on her chest.  Associated nausea, no vomiting.  No shortness of breath.  Also reports some swelling of her bilateral lower extremities, has been taking her Lasix as prescribed.  Home Medications Prior to Admission medications   Medication Sig Start Date End Date Taking? Authorizing Provider  albuterol (VENTOLIN HFA) 108 (90 Base) MCG/ACT inhaler Inhale 2 puffs into the lungs every 6 (six) hours as needed for wheezing or shortness of breath. 07/18/22   Cook, Barnie Del, DO  Atogepant (QULIPTA) 10 MG TABS Take 10 mg by mouth daily. 05/26/21   Baruch Gouty, FNP  celecoxib (CELEBREX) 200 MG capsule Take 1 capsule (200 mg total) by mouth 2 (two) times daily. 05/11/22   Carole Civil, MD  Cholecalciferol (VITAMIN D3) 1000 units CAPS Take 3,000 Units by mouth daily.    [provider]  citalopram (CELEXA) 20 MG tablet Take by mouth. 08/10/21   [provider]  dapagliflozin propanediol (FARXIGA) 10 MG TABS tablet Take 1 tablet (10 mg total) by mouth daily before breakfast. 01/12/22   Eileen Stanford, PA-C  DULERA 100-5 MCG/ACT AERO INHALE 2 PUFFS TWICE DAILY 07/29/21   Baruch Gouty, FNP  fluticasone (FLONASE) 50 MCG/ACT nasal spray Place 1 spray into both nostrils daily. 11/15/21   Baruch Gouty, FNP  furosemide (LASIX) 20 MG tablet Take 20 mg by mouth.    [provider]  hydrOXYzine (VISTARIL) 25 MG capsule TAKE  1 CAPSULE EVERY 8 HOURS AS NEEDED 10/02/22   Thersa Salt G, DO  levocetirizine (XYZAL) 5 MG tablet Take 1 tablet (5 mg total) by mouth every evening. 06/20/22   Ameduite, Trenton Gammon, FNP  losartan (COZAAR) 50 MG tablet Take 1 tablet (50 mg total) by mouth daily. 05/24/21   Baruch Gouty, FNP  melatonin 3 MG TABS tablet Take 10 mg by mouth at bedtime.    [provider]  omeprazole (PRILOSEC) 40 MG capsule Take 40 mg by mouth daily.    [provider]  ondansetron (ZOFRAN-ODT) 4 MG disintegrating tablet TAKE 1 TABLET EVERY 8 HOURS AS NEEDED FOR NAUSEA OR VOMITING 10/02/22   Thersa Salt G, DO  polyethylene glycol (MIRALAX / GLYCOLAX) 17 g packet Take 17 g by mouth daily. 08/07/22   Ripley Fraise, MD  pregabalin (LYRICA) 75 MG capsule TAKE ONE CAPSULE TWICE DAILY 11/14/22   Kirsteins, Luanna Salk, MD  promethazine-dextromethorphan (PROMETHAZINE-DM) 6.25-15 MG/5ML syrup Take 5 mLs by mouth 4 (four) times daily as needed. 07/07/22   Coral Spikes, DO  spironolactone (ALDACTONE) 25 MG tablet Take 12.5 mg by mouth daily.    [provider]  tirzepatide [name]) 2.5 MG/0.5ML Pen Inject 2.5 mg into the skin once a week. Patient not taking: Reported on 07/07/2022 06/12/22   Ameduite, Trenton Gammon, FNP  torsemide (DEMADEX) 20 MG tablet TAKE ONE TABLET ONCE DAILY 10/02/22   Thersa Salt  G, DO  traZODone (DESYREL) 100 MG tablet TAKE TWO TABLETS AT BEDTIME 08/25/22   Thersa Salt G, DO      Allergies    Buprenorphine hcl, Morphine, Morphine and related, Penicillins, Gabapentin, Nsaids, Sulfa antibiotics, Ibuprofen-acetaminophen, Latex, and Tylenol [acetaminophen]    Review of Systems   Review of Systems  Respiratory:  Negative for shortness of breath.   Cardiovascular:  Positive for chest pain and leg swelling.  Neurological:  Positive for headaches.    Physical Exam Updated Vital Signs BP 125/88   Pulse (!) 58   Temp 98.4 F (36.9 C)   Resp 16   Ht 5\' 9"  (1.753 m)   Wt (!) 150.5  kg   SpO2 97%   BMI 49.00 kg/m  Physical Exam Vitals and nursing note reviewed.  Constitutional:      General: She is not in acute distress.    Appearance: She is well-developed.  HENT:     Head: Normocephalic and atraumatic.  Eyes:     Conjunctiva/sclera: Conjunctivae normal.  Cardiovascular:     Rate and Rhythm: Normal rate and regular rhythm.     Heart sounds: No murmur heard. Pulmonary:     Effort: Pulmonary effort is normal. No respiratory distress.     Breath sounds: Normal breath sounds.  Abdominal:     Palpations: Abdomen is soft.     Tenderness: There is no abdominal tenderness.  Musculoskeletal:        General: No swelling.     Cervical back: Neck supple.     Right lower leg: 2+ Pitting Edema present.     Left lower leg: 2+ Pitting Edema present.  Skin:    General: Skin is warm and dry.     Capillary Refill: Capillary refill takes less than 2 seconds.  Neurological:     Mental Status: She is alert.  Psychiatric:        Mood and Affect: Mood normal.     ED Results / Procedures / Treatments   Labs (all labs ordered are listed, but only abnormal results are displayed) Labs Reviewed  BASIC METABOLIC PANEL - Abnormal; Notable for the following components:      Result Value   Chloride 97 (*)    Glucose, Bld 101 (*)    Creatinine, Ser 1.14 (*)    All other components within normal limits  CBC  BRAIN NATRIURETIC PEPTIDE  TROPONIN I (HIGH SENSITIVITY)  TROPONIN I (HIGH SENSITIVITY)    EKG None  Radiology CT Head Wo Contrast  Result Date: 11/14/2022 CLINICAL DATA:  Sudden onset headaches, initial encounter EXAM: CT HEAD WITHOUT CONTRAST TECHNIQUE: Contiguous axial images were obtained from the base of the skull through the vertex without intravenous contrast. RADIATION DOSE REDUCTION: This exam was performed according to the departmental dose-optimization program which includes automated exposure control, adjustment of the mA and/or kV according to patient  size and/or use of iterative reconstruction technique. COMPARISON:  02/20/2011 FINDINGS: Brain: No evidence of acute infarction, hemorrhage, hydrocephalus, extra-axial collection or mass lesion/mass effect. Vascular: No hyperdense vessel or unexpected calcification. Skull: Normal. Negative for fracture or focal lesion. Sinuses/Orbits: No acute finding.  Prosthetic right eye is noted. Other: None IMPRESSION: No acute intracranial abnormality noted. Electronically Signed   By: Inez Catalina M.D.   On: 11/14/2022 21:16   DG Chest 2 View  Result Date: 11/14/2022 CLINICAL DATA:  Chest heaviness and right-sided pain, initial encounter EXAM: CHEST - 2 VIEW COMPARISON:  11/17/2021 FINDINGS: The heart size  and mediastinal contours are within normal limits. Both lungs are clear. The visualized skeletal structures are unremarkable. IMPRESSION: No active cardiopulmonary disease. Electronically Signed   By: Inez Catalina M.D.   On: 11/14/2022 21:06    Procedures Procedures    Medications Ordered in ED Medications  dexamethasone (DECADRON) injection 8 mg (8 mg Intravenous Given 11/14/22 2128)  metoCLOPramide (REGLAN) injection 10 mg (10 mg Intravenous Given 11/14/22 2128)  diphenhydrAMINE (BENADRYL) injection 25 mg (25 mg Intravenous Given 11/14/22 2127)  fentaNYL (SUBLIMAZE) injection 50 mcg (50 mcg Intravenous Given 11/14/22 2127)    ED Course/ Medical Decision Making/ A&P                             Medical Decision Making Patient is a 42 year old female, here for chest discomfort, and severe headache.  Began today, thunderclap, we will obtain head CT secondary to severity of the headache, as well as chest x-ray, troponins for the chest pain.  Pain is not pleuritic.  Amount and/or Complexity of Data Reviewed Labs: ordered.    Details: Troponins x 2 negative Radiology: ordered.    Details: Chest x-ray clear Discussion of management or test interpretation with external provider(s): Discussed with patient  she is feeling much better after headache meds, she is ready to go home.  Heart score of 3.  She was not hypertensive for me in the ER, but I encouraged her to follow-up with her primary care doctor and watch closely.  It is unclear what causes hypertensive in nature, her cough is new so that this may be secondary to the newness of the cough or poorly just a cough.  It may be too Aldeen Riga for her arm.  I encouraged her to follow-up and maybe try another cough.  Return to the ER if she has worsening chest pain, shortness of breath or headache.  Risk Prescription drug management.    Final Clinical Impression(s) / ED Diagnoses Final diagnoses:  Chest pain, unspecified type  Acute nonintractable headache, unspecified headache type    Rx / DC Orders ED Discharge Orders     None         Jeffey Janssen, Si Gaul, PA 11/14/22 2242    Milton Ferguson, MD 11/16/22 1023

## 2022-11-14 NOTE — ED Notes (Signed)
Dc instructions and scripts reviewed with pt no questions or concerns at this time.  ?

## 2022-11-14 NOTE — Discharge Instructions (Addendum)
Your findings today were reassuring. Please follow-up with your PCP for further evalution. You were not having a heart attack today and your head Ct was normal. Return to the ED for worsening chest pain, severe headache or severe shortness of breath.

## 2022-11-15 ENCOUNTER — Telehealth: Payer: Self-pay | Admitting: *Deleted

## 2022-11-15 NOTE — Transitions of Care (Post Inpatient/ED Visit) (Signed)
   11/15/2022  Name: Mary [name] MRN: [id number] DOB: March 20, 1981  Today's TOC FU Call Status: Today's TOC FU Call Status:: Unsuccessul Call (1st Attempt) Unsuccessful Call (1st Attempt) Date: 11/15/22  Attempted to reach the patient regarding the most recent Inpatient/ED visit.  Follow Up Plan: Additional outreach attempts will be made to reach the patient to complete the Transitions of Care (Post Inpatient/ED visit) call.   Lurena Joiner RN, BSN [facility] RN Care Coordinator [phone]

## 2022-11-20 ENCOUNTER — Other Ambulatory Visit: Payer: Medicaid Other | Admitting: Licensed Clinical Social Worker

## 2022-11-20 DIAGNOSIS — Z419 Encounter for procedure for purposes other than remedying health state, unspecified: Secondary | ICD-10-CM | POA: Diagnosis not present

## 2022-11-20 NOTE — Patient Instructions (Signed)
Visit Information  Mary [name] was given information about Medicaid Managed Care team care coordination services as a part of their [facility] Medicaid benefit. Mary [name] verbally consented to engagement with the [facility] Managed Care team.   If you are experiencing a medical emergency, please call 911 or report to your local emergency department or urgent care.   If you have a non-emergency medical problem during routine business hours, please contact your provider's office and ask to speak with a nurse.   For questions related to your [facility] health plan, please call: [phone] or go here:[email or web address]  If you would like to schedule transportation through your [facility] plan, please call the following number at least 2 days in advance of your appointment: [phone].  You can also use the MTM portal or MTM mobile app to manage your rides. For the portal, please go to [email or web address].  Call the [place] at [phone], at any time, 24 hours a day, 7 days a week. If you are in danger or need immediate medical attention call 911.  If you would like help to quit smoking, call 1-800-QUIT-NOW (878)370-6053) OR Espaol: 1-855-Djelo-Ya HD:1601594) o para ms informacin haga clic aqu or Text READY to 200-400 to register via text  Following is a copy of your plan of care:  Care Plan : LCSW Plan of Care  Updates made by Greg Cutter, LCSW since 11/20/2022 12:00 AM     Problem: Anxiety Identification (Anxiety)      Long-Range Goal: Anxiety Symptoms Identified   Start Date: 10/06/2022  Note:   Priority: High  Timeframe:  Long-Range Goal Priority:  High Start Date:   10/06/22      Expected End Date:  ongoing                     Follow Up Date--12/12/22 at 3 pm  - keep 90 percent of scheduled appointments -consider counseling or psychiatry -consider bumping up your self-care  -consider creating a stronger support  network   Why is this important?             Combatting depression may take some time.            If you don't feel better right away, don't give up on your treatment plan.    Current barriers:   Chronic Mental Health needs related to addiction, PTSD, depression, stress and anxiety. Patient requires Support, Education, Resources, Referrals, Advocacy, and Care Coordination, in order to meet Unmet Mental Health Needs. Patient has a history of trauma and addiction Patient will implement clinical interventions discussed today to decrease symptoms of depression and increase knowledge and/or ability of: coping skills. Mental Health Concerns and Social Isolation Patient lacks knowledge of available community counseling agencies and resources. Ongoing issues with insomnia   Clinical Goal(s): verbalize understanding of plan for management of PTSD, Anxiety, Depression, and Stress and demonstrate a reduction in symptoms. Patient will connect with a provider for ongoing mental health treatment, increase coping skills, healthy habits, self-management skills, and stress reduction        Patient suggestions from LCSW-  Utilize healthy coping skills and supportive resources discussed Contact PCP with any questions or concerns Keep 90 percent of counseling appointments Call your insurance provider for more information about your Enhanced Benefits  Check out counseling resources provided  Begin personal counseling with LCSW, to reduce and manage symptoms of Depression and Stress, until well-established with mental health provider Accept all calls  from representative with [place] in an effort to establish ongoing mental health counseling and supportive services. Incorporate into daily practice - relaxation techniques, deep breathing exercises, and mindfulness meditation strategies. Talk about feelings with friends, family members, spiritual advisor, etc. Contact LCSW directly [phone]), if you have  questions, need assistance, or if additional social work needs are identified between now and our next scheduled telephone outreach call. Call 988 for mental health hotline/crisis line if needed (24/7 available) Try techniques to reduce symptoms of anxiety/negative thinking (deep breathing, distraction, positive self talk, etc)  - develop a personal safety plan - develop a plan to deal with triggers like holidays, anniversaries - exercise at least 2 to 3 times per week - have a plan for how to handle bad days - journal feelings and what helps to feel better or worse - spend time or talk with others at least 2 to 3 times per week - watch for early signs of feeling worse - begin personal counseling - call and visit an old friend - check out volunteer opportunities - join a support group - laugh; watch a funny movie or comedian - learn and use visualization or guided imagery - perform a random act of kindness - practice relaxation or meditation daily - start or continue a personal journal - practice positive thinking and self-talk -continue with compliance of taking medication  -identify current effective and ineffective coping strategies.  -implement positive self-talk in care to increase self-esteem, confidence and feelings of control.  -consider alternative and complementary therapy approaches such as meditation, mindfulness or yoga.  -journaling, prayer, worship services, meditation or pastoral counseling.  -increase participation in pleasurable group activities such as hobbies, singing, sports or volunteering).  -consider the use of meditative movement therapy such as tai chi, yoga or qigong.  -start a regular daily exercise program based on tolerance, ability and patient choice to support positive thinking and activity    If you are experiencing a Mental Health or [place] or need someone to talk to, please call the Suicide and Crisis Lifeline: [place]  Availability:    [facility]  [address] [place], [place] Crisis [phone]   [facility] [employer] [phone]   [place]  [phone]    [place]  [phone] (after hours)   Therapeutic Alternative/Mobile Crisis   [phone]   [place] National Suicide Hotline  5342069311 (TALK) OR 988   Call 911 or go to emergency room   [facility]  [phone]);  [place] and [employer]  [phone]); [place], [place], [place], [place], [place], [place], [place]        10 LITTLE Things To Do When You're Feeling Too Down To Do Anything  Take a shower. Even if you plan to stay in all day long and not see a soul, take a shower. It takes the most effort to hop in to the shower but once you do, you'll feel immediate results. It will wake you up and you'll be feeling much fresher (and cleaner too).  Brush and floss your teeth. Give your teeth a good brushing with a floss finish. It's a small task but it feels so good and you can check 'taking care of your health' off the list of things to do.  Do something small on your list. Most of [place] have some small thing we would like  to get done (load of laundry, sew a button, email a friend). Doing one of these things will make you feel like you've accomplished something.  Drink water. Drinking water is easy right? It's also really beneficial for your health so keep a glass beside you all day and take sips often. It gives you energy and prevents you from boredom eating.  Do some floor exercises. The last thing you want to do is exercise but it might be just the thing you need the most. Keep it simple and do exercises that involve sitting or laying on the floor. Even the smallest of exercises release chemicals in the brain that make you feel good. Yoga stretches or core exercises are going to make you  feel good with minimal effort.  Make your bed. Making your bed takes a few minutes but it's productive and you'll feel relieved when it's done. An unmade bed is a huge visual reminder that you're having an unproductive day. Do it and consider it your housework for the day.  Put on some nice clothes. Take the sweatpants off even if you don't plan to go anywhere. Put on clothes that make you feel good. Take a look in the mirror so your brain recognizes the sweatpants have been replaced with clothes that make you look great. It's an instant confidence booster.  Wash the dishes. A pile of dirty dishes in the sink is a reflection of your mood. It's possible that if you wash up the dishes, your mood will follow suit. It's worth a try.  Cook a real meal. If you have the luxury to have a "do nothing" day, you have time to make a real meal for yourself. Make a meal that you love to eat. The process is good to get you out of the funk and the food will ensure you have more energy for tomorrow.  Write out your thoughts by hand. When you hand write, you stimulate your brain to focus on the moment that you're in so make yourself comfortable and write whatever comes into your mind. Put those thoughts out on paper so they stop spinning around in your head. Those thoughts might be the very thing holding you down.    Patient Goals: Follow up goal

## 2022-11-20 NOTE — Patient Outreach (Signed)
Medicaid Managed Care Social Work Note  11/20/2022 Name:  Mary [name] MRN:  [id number] DOB:  05-Sep-1980  Mary [name] is an 42 y.o. year old female who is a primary patient of Coral Spikes, DO.  The Medicaid Managed Care Coordination team was consulted for assistance with:  [place] and Resources  Mary [name] was given information about Medicaid Managed Care Coordination team services today. Mary [name] Patient agreed to services and verbal consent obtained.  Engaged with patient  for by telephone forfollow up visit in response to referral for case management and/or care coordination services.   Assessments/Interventions:  Review of past medical history, allergies, medications, health status, including review of consultants reports, laboratory and other test data, was performed as part of comprehensive evaluation and provision of chronic care management services.  SDOH: (Social Determinant of Health) assessments and interventions performed: SDOH Interventions    Flowsheet Row Patient Outreach Telephone from 11/20/2022 in [place] Patient Outreach Telephone from 11/01/2022 in [place] Patient Outreach Telephone from 10/06/2022 in [place] Patient Outreach Telephone from 08/28/2022 in [place] Telephone from 08/09/2022 in [place] Office Visit from 06/08/2022 in [facility]  SDOH Interventions        Food Insecurity Interventions -- -- -- Other (Comment)  [BSW referral for local food pantries] -- --  Housing Interventions -- -- -- Intervention Not Indicated -- --  Transportation Interventions -- -- -- Intervention Not Indicated Intervention Not Indicated --  Utilities Interventions -- -- -- Intervention Not Indicated -- --  Depression Interventions/Treatment  -- -- Referral to  Psychiatry, Medication -- -- Medication, Referral to Psychiatry  Stress Interventions Offered [employer], Provide Counseling  [Anxiety due to recent ED visit] [place], Provide Counseling Offered [employer], Provide Counseling -- -- --       Advanced Directives Status:  See Care Plan for related entries.  Care Plan                 Allergies  Allergen Reactions   Buprenorphine Hcl Anaphylaxis    "it stops my heart"   Morphine Anaphylaxis    i quit breathing    Morphine And Related Anaphylaxis    "it stops my heart"   Penicillins Anaphylaxis and Other (See Comments)    Has patient had a PCN reaction causing immediate rash, facial/tongue/throat swelling, SOB or lightheadedness with hypotension: Yes Has patient had a PCN reaction causing severe rash involving mucus membranes or skin necrosis: Yes Has patient had a PCN reaction that required hospitalization: Yes Has patient had a PCN reaction occurring within the last 10 years: No If all of the above answers are "NO", then may proceed with Cephalosporin use.    Gabapentin Other (See Comments)    Per patient "hallucination"   Nsaids    Sulfa Antibiotics     Other reaction(s): Unknown   Ibuprofen-Acetaminophen Nausea And Vomiting   Latex Rash   Tylenol [Acetaminophen] Nausea And Vomiting    Medications Reviewed Today     Reviewed by Melissa Montane, RN (Registered Nurse) on 11/10/22 at 1532  Med List Status: <None>   Medication Order Taking? Sig Documenting Provider Last Dose Status Informant  albuterol (VENTOLIN HFA) 108 (90 Base) MCG/ACT inhaler [id number] Yes Inhale 2 puffs into the lungs every 6 (six) hours as needed for wheezing or shortness of  breath. Coral Spikes, DO Taking Active   Atogepant (QULIPTA) 10 MG TABS [id number] Yes Take 10 mg by mouth daily. Baruch Gouty, FNP Taking Active   celecoxib (CELEBREX) 200 MG capsule [id number] Yes Take 1 capsule (200 mg  total) by mouth 2 (two) times daily. Carole Civil, MD Taking Active   Cholecalciferol (VITAMIN D3) 1000 units CAPS [id number] Yes Take 3,000 Units by mouth daily. [provider] Taking Active   citalopram (CELEXA) 20 MG tablet [id number] Yes Take by mouth. [provider] Taking Active   dapagliflozin propanediol (FARXIGA) 10 MG TABS tablet [id number] Yes Take 1 tablet (10 mg total) by mouth daily before breakfast. Eileen Stanford, PA-C Taking Active   DULERA 100-5 MCG/ACT AERO [id number] Yes INHALE 2 PUFFS TWICE DAILY Rakes, Connye Burkitt, FNP Taking Active   fluticasone (FLONASE) 50 MCG/ACT nasal spray [id number] Yes Place 1 spray into both nostrils daily. Baruch Gouty, FNP Taking Active   furosemide (LASIX) 20 MG tablet [id number] Yes Take 20 mg by mouth. [provider] Taking Active   hydrOXYzine (VISTARIL) 25 MG capsule [id number] Yes TAKE 1 CAPSULE EVERY 8 HOURS AS NEEDED Lacinda Axon, Jayce G, DO Taking Active   levocetirizine (XYZAL) 5 MG tablet [id number] Yes Take 1 tablet (5 mg total) by mouth every evening. Ameduite, Trenton Gammon, FNP Taking Active   losartan (COZAAR) 50 MG tablet [id number] Yes Take 1 tablet (50 mg total) by mouth daily. Baruch Gouty, FNP Taking Active   melatonin 3 MG TABS tablet [id number] Yes Take 10 mg by mouth at bedtime. [provider] Taking Active   omeprazole (PRILOSEC) 40 MG capsule [id number] Yes Take 40 mg by mouth daily. [provider] Taking Active   ondansetron (ZOFRAN-ODT) 4 MG disintegrating tablet [id number] Yes TAKE 1 TABLET EVERY 8 HOURS AS NEEDED FOR NAUSEA OR VOMITING Cook, Jayce G, DO Taking Active   polyethylene glycol (MIRALAX / GLYCOLAX) 17 g packet [id number] Yes Take 17 g by mouth daily. Ripley Fraise, MD Taking Active   pregabalin (LYRICA) 75 MG capsule [id number] Yes TAKE ONE CAPSULE TWICE DAILY Bayard Hugger, NP Taking Active   promethazine-dextromethorphan (PROMETHAZINE-DM) 6.25-15 MG/5ML syrup [id number]  Yes Take 5 mLs by mouth 4 (four) times daily as needed. Coral Spikes, DO Taking Active   spironolactone (ALDACTONE) 25 MG tablet [id number] Yes Take 12.5 mg by mouth daily. [provider] Taking Active   tirzepatide [facility]) 2.5 MG/0.5ML Pen [id number] No Inject 2.5 mg into the skin once a week.  Patient not taking: Reported on 07/07/2022   Ameduite, Trenton Gammon, FNP Not Taking Active   torsemide (DEMADEX) 20 MG tablet [id number] Yes TAKE ONE TABLET ONCE DAILY Coral Spikes, DO Taking Active   traZODone (DESYREL) 100 MG tablet [id number] Yes TAKE TWO TABLETS AT BEDTIME Coral Spikes, DO Taking Active             Patient Active Problem List   Diagnosis Date Noted   Cough 07/09/2022   OSA (obstructive sleep apnea) 03/16/2022   Chronic pain syndrome 05/24/2021   High risk medication use 05/24/2021   Allergic rhinitis 05/24/2021   Morbid obesity 06/11/2020   Primary hypertension 06/11/2020   Migraine headache without aura 12/02/2019   History of cervical cancer 02/06/2018   Depression, recurrent 02/06/2018   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   COPD (chronic obstructive pulmonary disease) 02/03/2014   GAD (generalized anxiety disorder) 02/03/2014   PTSD (post-traumatic stress disorder) 02/03/2014    Conditions to  be addressed/monitored per PCP order:  Anxiety  Care Plan : LCSW Plan of Care  Updates made by Greg Cutter, LCSW since 11/20/2022 12:00 AM     Problem: Anxiety Identification (Anxiety)      Long-Range Goal: Anxiety Symptoms Identified   Start Date: 10/06/2022  Note:   Priority: High  Timeframe:  Long-Range Goal Priority:  High Start Date:   10/06/22      Expected End Date:  ongoing                     Follow Up Date--12/12/22 at 3 pm  - keep 90 percent of scheduled appointments -consider counseling or psychiatry -consider bumping up your self-care  -consider creating a stronger support network   Why is this important?             Combatting depression may  take some time.            If you don't feel better right away, don't give up on your treatment plan.    Current barriers:   Chronic Mental Health needs related to addiction, PTSD, depression, stress and anxiety. Patient requires Support, Education, Resources, Referrals, Advocacy, and Care Coordination, in order to meet Unmet Mental Health Needs. Patient has a history of trauma and addiction Patient will implement clinical interventions discussed today to decrease symptoms of depression and increase knowledge and/or ability of: coping skills. Mental Health Concerns and Social Isolation Patient lacks knowledge of available community counseling agencies and resources. Ongoing issues with insomnia   Clinical Goal(s): verbalize understanding of plan for management of PTSD, Anxiety, Depression, and Stress and demonstrate a reduction in symptoms. Patient will connect with a provider for ongoing mental health treatment, increase coping skills, healthy habits, self-management skills, and stress reduction        Clinical Interventions:  Assessed patient's previous and current treatment, coping skills, support system and barriers to care. Patient provided hx  Verbalization of feelings encouraged, motivational interviewing employed Emotional support provided, positive coping strategies explored. Establishing healthy boundaries emphasized and healthy self-care education provided Patient was educated on available mental health resources within their area that accept Medicaid and offer counseling and psychiatry. Patient is agreeable to referral to [facility]  for counseling at [facility] and understands that an additional` referral will have to be made later once they start accepting new clients again for psychiatry. [facility] LCSW made referral for counseling only on 10/06/22. LCSW 10/17/22 update- [facility] LCSW spoke with [place] at [facility] staff and was informed that  they are booked out until May of 2024. Patient was provided this update but had to take another call that was coming in from her PCP office. [facility] LCSW sent email to patient with resources for her to review until next week's follow up social work appointment. [facility] LCSW 11/01/22 update- Patient reports that she has received great news recently that she will become a grandmother this year. She reports that she continues to remain free of illicit substance and alcohol use. Positive reinforcement provided for this successful self-care implementation. [facility] LCSW was able to communicate to [facility] and get patient scheduled for therapy. However, patient is still in need of finding a psychiatrist. Email was sent to patient with resources for her to review but patient is strongly considering going back to [place] as [place] is in her county. 11/20/22 update - Patient has an upcoming initial counseling appointment on  12/20/22 at 3 pm at [facility]. 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 Patient is going to the gym at [employer] and even has a staff member there that is assisting her with personal training. Patient had a recent ED visit but overall is implementing healthy self-care tools into her daily routine to promote healthy living. Patient was provided positive reinforcement for ACTIVELY working on patient's mental and physical health. Patient is agreeable to contact [place] at [place] to get scheduled with a psychiatrist as well since she was only scheduled with a counselor which may be due to staff shortage.  Email with crisis support resources and [facility]'s walk in clinic hours was sent as well  LCSW provided education on relaxation techniques such as meditation, deep breathing, massage, grounding exercises or yoga that can activate the body's relaxation response and ease symptoms of stress and anxiety. LCSW ask that when pt is  struggling with difficult emotions and racing thoughts that they start this relaxation response process. LCSW provided extensive education on healthy coping skills for anxiety. SW used active and reflective listening, validated patient's feelings/concerns, and provided emotional support. Patient will work on implementing appropriate self-care habits into their daily routine such as: staying positive, writing a gratitude list, drinking water, staying active around the house, taking their medications as prescribed, combating negative thoughts or emotions and staying connected with their family and friends. Positive reinforcement provided for this decision to work on this. LCSW provided education on healthy sleep hygiene and what that looks like. LCSW encouraged patient to implement a night time routine into their schedule that works best for them and that they are able to maintain. Advised patient to implement deep breathing/grounding/meditation/self-care exercises into their nightly routine to combat racing thoughts at night. LCSW encouraged patient to wake up at the same time each day, make their sleeping environment comfortable, exercise when able, to limit naps and to not eat or drink anything right before bed.  Motivational Interviewing employed Depression screen reviewed  PHQ2/ PHQ9 completed or reviewed  Mindfulness or Relaxation training provided Active listening / Reflection utilized  Advance Care and HCPOA education provided Emotional Support Provided Problem [place] strategies reviewed Provided psychoeducation for mental health needs  Provided brief CBT  Reviewed mental health medications and discussed importance of compliance:  Quality of sleep assessed & Sleep Hygiene techniques promoted  Participation in counseling encouraged  Verbalization of feelings encouraged  Suicidal Ideation/Homicidal Ideation assessed: Patient denies SI/HI  Review resources, discussed options and  provided patient information about  [place] care team collaboration (see longitudinal plan of care) Patient Goals/Self-Care Activities: Over the next 120 days Attend scheduled medical appointments Utilize healthy coping skills and supportive resources discussed Contact PCP with any questions or concerns Keep 90 percent of counseling appointments Call your insurance provider for more information about your Enhanced Benefits  Check out counseling resources provided  Begin personal counseling with LCSW, to reduce and manage symptoms of Depression and Stress, until well-established with mental health provider Accept all calls from representative with [employer] in an effort to establish ongoing mental health counseling and supportive services. Incorporate into daily practice - relaxation techniques, deep breathing exercises, and mindfulness meditation strategies. Talk about feelings with friends, family members, spiritual advisor, etc. Contact LCSW directly [phone]), if you have questions, need assistance, or if additional social work needs are identified between now and our next scheduled telephone outreach  call. Call 988 for mental health hotline/crisis line if needed (24/7 available) Try techniques to reduce symptoms of anxiety/negative thinking (deep breathing, distraction, positive self talk, etc)  - develop a personal safety plan - develop a plan to deal with triggers like holidays, anniversaries - exercise at least 2 to 3 times per week - have a plan for how to handle bad days - journal feelings and what helps to feel better or worse - spend time or talk with others at least 2 to 3 times per week - watch for early signs of feeling worse - begin personal counseling - call and visit an old friend - check out volunteer opportunities - join a support group - laugh; watch a funny movie or comedian - learn and use visualization or guided imagery -  perform a random act of kindness - practice relaxation or meditation daily - start or continue a personal journal - practice positive thinking and self-talk -continue with compliance of taking medication  -identify current effective and ineffective coping strategies.  -implement positive self-talk in care to increase self-esteem, confidence and feelings of control.  -consider alternative and complementary therapy approaches such as meditation, mindfulness or yoga.  -journaling, prayer, worship services, meditation or pastoral counseling.  -increase participation in pleasurable group activities such as hobbies, singing, sports or volunteering).  -consider the use of meditative movement therapy such as tai chi, yoga or qigong.  -start a regular daily exercise program based on tolerance, ability and patient choice to support positive thinking and activity    If you are experiencing a Mental Health or [place] or need someone to talk to, please call the Suicide and Crisis Lifeline: [place] Availability:    [facility]  [address] [place], [place] Crisis [phone]   [facility] [employer] [phone]   [place]  [phone]    [place]  [phone] (after hours)   Therapeutic Alternative/Mobile Crisis   [phone]   [place] National Suicide Hotline  (418)346-4609 (TALK) OR 988   Call 911 or go to emergency room   [facility]  [phone]);  [place] and [employer]  [phone]); [place], [place], [place], [place], [place], [place], [place]        10 LITTLE Things To Do When You're Feeling Too Down To Do Anything  Take a shower. Even if you plan to stay in all day long and not see a soul, take a shower. It takes the most effort to hop in to the shower but once you do, you'll feel immediate results. It  will wake you up and you'll be feeling much fresher (and cleaner too).  Brush and floss your teeth. Give your teeth a good brushing with a floss finish. It's a small task but it feels so good and you can check 'taking care of your health' off the list of things to do.  Do something small on your list. Most of [place] have some small thing we would like to get done (load of laundry, sew a button, email a friend). Doing one of these things will make you feel like you've accomplished something.  Drink water. Drinking water is easy right? It's also really beneficial for your health so keep a glass beside you all day and take sips often. It gives you energy and prevents you from boredom eating.  Do some floor exercises.  The last thing you want to do is exercise but it might be just the thing you need the most. Keep it simple and do exercises that involve sitting or laying on the floor. Even the smallest of exercises release chemicals in the brain that make you feel good. Yoga stretches or core exercises are going to make you feel good with minimal effort.  Make your bed. Making your bed takes a few minutes but it's productive and you'll feel relieved when it's done. An unmade bed is a huge visual reminder that you're having an unproductive day. Do it and consider it your housework for the day.  Put on some nice clothes. Take the sweatpants off even if you don't plan to go anywhere. Put on clothes that make you feel good. Take a look in the mirror so your brain recognizes the sweatpants have been replaced with clothes that make you look great. It's an instant confidence booster.  Wash the dishes. A pile of dirty dishes in the sink is a reflection of your mood. It's possible that if you wash up the dishes, your mood will follow suit. It's worth a try.  Cook a real meal. If you have the luxury to have a "do nothing" day, you have time to make a real meal for yourself. Make a meal that you love to eat. The  process is good to get you out of the funk and the food will ensure you have more energy for tomorrow.  Write out your thoughts by hand. When you hand write, you stimulate your brain to focus on the moment that you're in so make yourself comfortable and write whatever comes into your mind. Put those thoughts out on paper so they stop spinning around in your head. Those thoughts might be the very thing holding you down.    Patient Goals: Follow up goal     Follow up:  Patient agrees to Care Plan and Follow-up.  Plan: The Managed Medicaid care management team will reach out to the patient again over the next 30 days.  Date/time of next scheduled Social Work care management/care coordination outreach:  12/12/22 at 3 pm.  Eula Fried, [place], MSW, [place] Medicaid LCSW [email or web address] Phone: [phone]

## 2022-11-24 ENCOUNTER — Other Ambulatory Visit: Payer: Self-pay | Admitting: Family Medicine

## 2022-11-24 ENCOUNTER — Ambulatory Visit (INDEPENDENT_AMBULATORY_CARE_PROVIDER_SITE_OTHER): Payer: Medicaid Other | Admitting: Family Medicine

## 2022-11-24 VITALS — BP 139/87 | HR 67 | Ht 69.0 in | Wt 355.8 lb

## 2022-11-24 DIAGNOSIS — I10 Essential (primary) hypertension: Secondary | ICD-10-CM | POA: Diagnosis not present

## 2022-11-24 DIAGNOSIS — W57XXXA Bitten or stung by nonvenomous insect and other nonvenomous arthropods, initial encounter: Secondary | ICD-10-CM | POA: Diagnosis not present

## 2022-11-24 DIAGNOSIS — R739 Hyperglycemia, unspecified: Secondary | ICD-10-CM | POA: Diagnosis not present

## 2022-11-24 DIAGNOSIS — S20461A Insect bite (nonvenomous) of right back wall of thorax, initial encounter: Secondary | ICD-10-CM | POA: Diagnosis not present

## 2022-11-24 MED ORDER — DOXYCYCLINE HYCLATE 100 MG PO TABS
100.0000 mg | ORAL_TABLET | Freq: Two times a day (BID) | ORAL | 0 refills | Status: DC
Start: 2022-11-24 — End: 2022-12-15

## 2022-11-24 MED ORDER — PREGABALIN 75 MG PO CAPS: ORAL_CAPSULE | ORAL | 2 refills | Status: DC

## 2022-11-24 MED ORDER — LOSARTAN POTASSIUM 100 MG PO TABS: ORAL_TABLET | ORAL | 4 refills | Status: DC

## 2022-11-24 NOTE — Progress Notes (Unsigned)
   Subjective:    Patient ID: Mary [name], female    DOB: 08/10/1981, 42 y.o.   MRN: [id number]  HPI Patient arrives today for hospital follow up for high blood pressure.Patient states she went to the ED on 11/16/22.  Patient states she has not taken her blood pressure medication or fluid pill this morning. Patient also states she found tick on her right shoulder yesterday. Patient states it maybe cause of elevated blood pressure.   Hyperglycemia - Plan: Hemoglobin A1c  Primary hypertension - Plan: losartan (COZAAR) 100 MG tablet, Basic Metabolic Panel (7), Microalbumin/Creatinine Ratio, Urine  Tick bite of right back wall of thorax, initial encounter Patient relates she had tick bite on her back been going on for a while because it is raised red and itching and she finally had someone look at it and there was a tick there, they remove the tick without difficulty she denies sweats or chills but she does state not feeling good the past several days with some body aches no wheezing difficulty breathing  She also relates chronic insomnia issues states trazodone does not help she states in the past Valium helped I did discuss with her how that is not the current approach for these type of issues would discourage that  We did discuss keeping blood pressure under good control and proper behavioral techniques to help do so    Review of Systems     Objective:   Physical Exam  General-in no acute distress Eyes-no discharge Lungs-respiratory rate normal, CTA CV-no murmurs,RRR Extremities skin warm dry no edema Neuro grossly normal Behavior normal, alert       Assessment & Plan:  HTN-bump up losartan 100 mg Check metabolic 7 along with urine ACR in 7 days Patient interested in getting back on Ozempic or Mounjaro it is hard to tell when she was originally started on if she was truly diabetic or not.  Check A1c She has chronic back pain and discomfort was seen Dr. Wynn Banker but  states that she will not be going back there we did discuss how we could adjust the dose of Lyrica 75 mg 1 taken 3 times daily and she will do a follow-up visit in several weeks If this is not adequately treating her back discomfort potentially referral to pain management with [place] As for her sleep I recommend she stick with trazodone she mention trying Valium again but I told her that we could not prescribe that-no longer the accepted standard of care

## 2022-11-27 DIAGNOSIS — I5032 Chronic diastolic (congestive) heart failure: Secondary | ICD-10-CM | POA: Diagnosis not present

## 2022-11-28 ENCOUNTER — Ambulatory Visit: Payer: Medicaid Other | Admitting: Family Medicine

## 2022-11-28 DIAGNOSIS — I5032 Chronic diastolic (congestive) heart failure: Secondary | ICD-10-CM | POA: Diagnosis not present

## 2022-11-29 DIAGNOSIS — I5032 Chronic diastolic (congestive) heart failure: Secondary | ICD-10-CM | POA: Diagnosis not present

## 2022-11-30 DIAGNOSIS — I10 Essential (primary) hypertension: Secondary | ICD-10-CM | POA: Diagnosis not present

## 2022-11-30 DIAGNOSIS — R739 Hyperglycemia, unspecified: Secondary | ICD-10-CM | POA: Diagnosis not present

## 2022-11-30 DIAGNOSIS — I5032 Chronic diastolic (congestive) heart failure: Secondary | ICD-10-CM | POA: Diagnosis not present

## 2022-11-30 DIAGNOSIS — J449 Chronic obstructive pulmonary disease, unspecified: Secondary | ICD-10-CM | POA: Diagnosis not present

## 2022-12-01 DIAGNOSIS — I5032 Chronic diastolic (congestive) heart failure: Secondary | ICD-10-CM | POA: Diagnosis not present

## 2022-12-01 LAB — MICROALBUMIN / CREATININE URINE RATIO
Creatinine, Urine: 112.8 mg/dL
Microalb/Creat Ratio: 4 mg/g creat (ref 0–29)
Microalbumin, Urine: 4.4 ug/mL

## 2022-12-01 LAB — BASIC METABOLIC PANEL (7)
BUN/Creatinine Ratio: 8 — ABNORMAL LOW (ref 9–23)
BUN: 8 mg/dL (ref 6–24)
CO2: 27 mmol/L (ref 20–29)
Chloride: 103 mmol/L (ref 96–106)
Creatinine, Ser: 1 mg/dL (ref 0.57–1.00)
Glucose: 85 mg/dL (ref 70–99)
Potassium: 4.1 mmol/L (ref 3.5–5.2)
Sodium: 143 mmol/L (ref 134–144)
eGFR: 72 mL/min/{1.73_m2} (ref >59)

## 2022-12-01 LAB — HEMOGLOBIN A1C
Est. average glucose Bld gHb Est-mCnc: 105 mg/dL
Hgb A1c MFr Bld: 5.3 % (ref 4.8–5.6)

## 2022-12-04 DIAGNOSIS — I5032 Chronic diastolic (congestive) heart failure: Secondary | ICD-10-CM | POA: Diagnosis not present

## 2022-12-05 DIAGNOSIS — I5032 Chronic diastolic (congestive) heart failure: Secondary | ICD-10-CM | POA: Diagnosis not present

## 2022-12-06 DIAGNOSIS — I5032 Chronic diastolic (congestive) heart failure: Secondary | ICD-10-CM | POA: Diagnosis not present

## 2022-12-07 DIAGNOSIS — I5032 Chronic diastolic (congestive) heart failure: Secondary | ICD-10-CM | POA: Diagnosis not present

## 2022-12-08 DIAGNOSIS — I5032 Chronic diastolic (congestive) heart failure: Secondary | ICD-10-CM | POA: Diagnosis not present

## 2022-12-11 ENCOUNTER — Telehealth: Payer: Self-pay | Admitting: *Deleted

## 2022-12-11 DIAGNOSIS — I5032 Chronic diastolic (congestive) heart failure: Secondary | ICD-10-CM | POA: Diagnosis not present

## 2022-12-11 NOTE — Patient Outreach (Signed)
  Care Management   Note  12/11/2022 Name: Mary [name] MRN: [id number] DOB: Mar 21, 1981  [name] is enrolled in a Managed Medicaid plan: Yes. Outreach attempt today was successful.   Incoming call from Ms. Devincenzi. She experienced an unusual episode last night after eating [employer], he skin felt like it was on fire and her body went in "limp mode" for about 30 minutes. She has never experienced anything similar and is feeling better today. Patient is scheduled with PCP on 12/15/22. RNCM explained to patient to follow up with PCP earlier or be seen at urgent care, if symptoms reoccur. Patient expressed understanding.  Telephone follow up appointment with care management team member scheduled for:01/09/23 @ 3:30pm  [name] RN, BSN [place]  Managed [facility] RN Care Coordinator [phone]

## 2022-12-12 ENCOUNTER — Other Ambulatory Visit: Payer: Medicaid Other | Admitting: Licensed Clinical Social Worker

## 2022-12-12 DIAGNOSIS — I5032 Chronic diastolic (congestive) heart failure: Secondary | ICD-10-CM | POA: Diagnosis not present

## 2022-12-13 DIAGNOSIS — I5032 Chronic diastolic (congestive) heart failure: Secondary | ICD-10-CM | POA: Diagnosis not present

## 2022-12-13 NOTE — Patient Outreach (Signed)
Medicaid Managed Care Social Work Note  12/12/2022 Name:  Mary [name] MRN:  [id number] DOB:  03-27-1981  Mary [name] is an 42 y.o. year old female who is a primary patient of Mary Sams, DO.  The Medicaid Managed Care Coordination team was consulted for assistance with:  Mental Health Counseling and Resources  Ms. Mary [name] was given information about Medicaid Managed Care Coordination team services today. Mary [name] Patient agreed to services and verbal consent obtained.  Engaged with patient  for by telephone forfollow up visit in response to referral for case management and/or care coordination services.   Assessments/Interventions:  Review of past medical history, allergies, medications, health status, including review of consultants reports, laboratory and other test data, was performed as part of comprehensive evaluation and provision of chronic care management services.  SDOH: (Social Determinant of Health) assessments and interventions performed: SDOH Interventions    Flowsheet Row Patient Outreach Telephone from 12/12/2022 in [place] POPULATION HEALTH DEPARTMENT Patient Outreach Telephone from 11/20/2022 in [place] POPULATION HEALTH DEPARTMENT Patient Outreach Telephone from 11/01/2022 in [place] POPULATION HEALTH DEPARTMENT Patient Outreach Telephone from 10/06/2022 in [place] POPULATION HEALTH DEPARTMENT Patient Outreach Telephone from 08/28/2022 in [place] POPULATION HEALTH DEPARTMENT Telephone from 08/09/2022 in [place] POPULATION HEALTH DEPARTMENT  SDOH Interventions        Food Insecurity Interventions -- -- -- -- Other (Comment)  [BSW referral for local food pantries] --  Housing Interventions -- -- -- -- Intervention Not Indicated --  Transportation Interventions -- -- -- -- Intervention Not Indicated Intervention Not Indicated  Utilities Interventions -- -- -- -- Intervention Not Indicated --  Depression Interventions/Treatment  -- --  -- Referral to Psychiatry, Medication -- --  Stress Interventions Offered [employer], Provide Counseling Offered [employer], Provide Counseling  [Anxiety due to recent ED visit] Offered [employer], Provide Counseling Offered [employer] Resources, Provide Counseling -- --       Advanced Directives Status:  See Care Plan for related entries.  Care Plan                 Allergies  Allergen Reactions   Buprenorphine Hcl Anaphylaxis    "it stops my heart"   Morphine Anaphylaxis    i quit breathing    Morphine And Related Anaphylaxis    "it stops my heart"   Penicillins Anaphylaxis and Other (See Comments)    Has patient had a PCN reaction causing immediate rash, facial/tongue/throat swelling, SOB or lightheadedness with hypotension: Yes Has patient had a PCN reaction causing severe rash involving mucus membranes or skin necrosis: Yes Has patient had a PCN reaction that required hospitalization: Yes Has patient had a PCN reaction occurring within the last 10 years: No If all of the above answers are "NO", then may proceed with Cephalosporin use.    Gabapentin Other (See Comments)    Per patient "hallucination"   Nsaids    Sulfa Antibiotics     Other reaction(s): Unknown   Ibuprofen-Acetaminophen Nausea And Vomiting   Latex Rash   Tylenol [Acetaminophen] Nausea And Vomiting    Medications Reviewed Today     Reviewed by Elizbeth Squires, CMA (Certified Medical Assistant) on 11/24/22 at 1026  Med List Status: <None>   Medication Order Taking? Sig Documenting Provider Last Dose Status Informant  albuterol (VENTOLIN HFA) 108 (90 Base) MCG/ACT inhaler [id number] Yes Inhale 2 puffs into the lungs every 6 (six) hours as needed for  wheezing or shortness of breath. Mary Sams, DO Taking Active   Atogepant (QULIPTA) 10 MG TABS [id number] Yes Take 10 mg by mouth daily. Sonny Masters, FNP Taking Active   celecoxib (CELEBREX)  200 MG capsule [id number] Yes Take 1 capsule (200 mg total) by mouth 2 (two) times daily. Vickki Hearing, MD Taking Active   Cholecalciferol (VITAMIN D3) 1000 units CAPS [id number] Yes Take 3,000 Units by mouth daily. [provider] Taking Active   citalopram (CELEXA) 20 MG tablet [id number] Yes Take by mouth. [provider] Taking Active   dapagliflozin propanediol (FARXIGA) 10 MG TABS tablet [id number] Yes Take 1 tablet (10 mg total) by mouth daily before breakfast. Janetta Hora, PA-C Taking Active   DULERA 100-5 MCG/ACT AERO [id number] Yes INHALE 2 PUFFS TWICE DAILY Rakes, Doralee Albino, FNP Taking Active   fluticasone [facility]) 50 MCG/ACT nasal spray [id number] Yes 1 SPRAY IN EACH NOSTRIL ONCE A DAY Cook, Jayce G, DO Taking Active   furosemide (LASIX) 20 MG tablet [id number] Yes Take 20 mg by mouth. [provider] Taking Active            Med Note (ROBB, MELANIE A   Fri Nov 10, 2022  3:33 PM) Patient reports taking a 40 mg and 20 mg tablet daily  hydrOXYzine (VISTARIL) 25 MG capsule [id number] Yes TAKE 1 CAPSULE EVERY 8 HOURS AS NEEDED Adriana Simas, Jayce G, DO Taking Active   levocetirizine (XYZAL) 5 MG tablet [id number] Yes Take 1 tablet (5 mg total) by mouth every evening. Ameduite, Alvino Chapel, FNP Taking Active   losartan (COZAAR) 50 MG tablet [id number] Yes Take 1 tablet (50 mg total) by mouth daily. Sonny Masters, FNP Taking Active   melatonin 3 MG TABS tablet [id number] Yes Take 10 mg by mouth at bedtime. [provider] Taking Active   omeprazole (PRILOSEC) 40 MG capsule [id number] Yes Take 40 mg by mouth daily. [provider] Taking Active   ondansetron (ZOFRAN-ODT) 4 MG disintegrating tablet [id number] Yes TAKE 1 TABLET EVERY 8 HOURS AS NEEDED FOR NAUSEA OR VOMITING Cook, Jayce G, DO Taking Active   polyethylene glycol (MIRALAX / GLYCOLAX) 17 g packet [id number] Yes Take 17 g by mouth daily. Zadie Rhine, MD Taking Active   pregabalin (LYRICA) 75 MG  capsule [id number] Yes TAKE ONE CAPSULE TWICE DAILY Kirsteins, Victorino Sparrow, MD Taking Active   promethazine-dextromethorphan (PROMETHAZINE-DM) 6.25-15 MG/5ML syrup [id number] Yes Take 5 mLs by mouth 4 (four) times daily as needed. Mary Sams, DO Taking Active   spironolactone (ALDACTONE) 25 MG tablet [id number] Yes Take 12.5 mg by mouth daily. [provider] Taking Active   tirzepatide [facility]) 2.5 MG/0.5ML Pen [id number] Yes Inject 2.5 mg into the skin once a week. Ameduite, Alvino Chapel, FNP Taking Active   torsemide (DEMADEX) 20 MG tablet [id number] Yes TAKE ONE TABLET ONCE DAILY Mary Sams, DO Taking Active   traZODone (DESYREL) 100 MG tablet [id number] Yes TAKE TWO TABLETS AT BEDTIME Mary Sams, DO Taking Active             Patient Active Problem List   Diagnosis Date Noted   Cough 07/09/2022   OSA (obstructive sleep apnea) 03/16/2022   Chronic pain syndrome 05/24/2021   High risk medication use 05/24/2021   Allergic rhinitis 05/24/2021   Morbid obesity 06/11/2020   Primary hypertension 06/11/2020   Migraine headache without aura 12/02/2019   History of cervical cancer 02/06/2018   Depression, recurrent 02/06/2018   Blind  right eye 02/03/2014   COPD (chronic obstructive pulmonary disease) 02/03/2014   GAD (generalized anxiety disorder) 02/03/2014   PTSD (post-traumatic stress disorder) 02/03/2014    Conditions to be addressed/monitored per PCP order:  Anxiety  Care Plan : LCSW Plan of Care  Updates made by Gustavus Bryant, LCSW since 12/13/2022 12:00 AM     Problem: Anxiety Identification (Anxiety)      Long-Range Goal: Anxiety Symptoms Identified   Start Date: 10/06/2022  Note:   Priority: High  Timeframe:  Long-Range Goal Priority:  High Start Date:   10/06/22      Expected End Date:  ongoing                     Follow Up Date--12/22/22 at 1 pm  - keep 90 percent of scheduled appointments -consider counseling or psychiatry -consider bumping up your  self-care  -consider creating a stronger support network   Why is this important?             Combatting depression may take some time.            If you don't feel better right away, don't give up on your treatment plan.    Current barriers:   Chronic Mental Health needs related to addiction, PTSD, depression, stress and anxiety. Patient requires Support, Education, Resources, Referrals, Advocacy, and Care Coordination, in order to meet Unmet Mental Health Needs. Patient has a history of trauma and addiction Patient will implement clinical interventions discussed today to decrease symptoms of depression and increase knowledge and/or ability of: coping skills. Mental Health Concerns and Social Isolation Patient lacks knowledge of available community counseling agencies and resources. Ongoing issues with insomnia   Clinical Goal(s): verbalize understanding of plan for management of PTSD, Anxiety, Depression, and Stress and demonstrate a reduction in symptoms. Patient will connect with a provider for ongoing mental health treatment, increase coping skills, healthy habits, self-management skills, and stress reduction        Clinical Interventions:  Assessed patient's previous and current treatment, coping skills, support system and barriers to care. Patient provided hx  Verbalization of feelings encouraged, motivational interviewing employed Emotional support provided, positive coping strategies explored. Establishing healthy boundaries emphasized and healthy self-care education provided Patient was educated on available mental health resources within their area that accept Medicaid and offer counseling and psychiatry. Patient is agreeable to referral to [facility]  for counseling at [facility] and understands that an additional` referral will have to be made later once they start accepting new clients again for psychiatry. [facility] LCSW made referral for counseling only  on 10/06/22. LCSW 10/17/22 update- [facility] LCSW spoke with [facility] staff and was informed that they are booked out until May of 2024. Patient was provided this update but had to take another call that was coming in from her PCP office. [facility] LCSW sent email to patient with resources for her to review until next week's follow up social work appointment. [facility] LCSW 11/01/22 update- Patient reports that she has received great news recently that she will become a grandmother this year. She reports that she continues to remain free of illicit substance and alcohol use. Positive reinforcement provided for this successful self-care implementation. [facility] LCSW was able to communicate to [facility] and get patient scheduled for therapy. However, patient is still in need of finding a psychiatrist. Email was sent to patient with resources for  her to review but patient is strongly considering going back to [facility] as it is in her county. 11/20/22 update - Patient has an upcoming initial counseling appointment on 12/20/22 at 3 pm at [facility]. 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 Patient is going to the gym at [employer] and even has a staff member there that is assisting her with personal training. Patient had a recent ED visit but overall is implementing healthy self-care tools into her daily routine to promote healthy living. Patient was provided positive reinforcement for ACTIVELY working on patient's mental and physical health. Patient is agreeable to contact [facility] [place], [place] to get scheduled with a psychiatrist as well since she was only scheduled with a counselor which may be due to staff shortage. 12/13/22 update- patient was able to get scheduled with a counselor but wishes to wait before gaining psychiatry until she is established at [facility] [place]. Patient's mood have lifted and  improved with ongoing self-care implementation.  Email with crisis support resources and [facility]'s walk in clinic hours was sent as well  LCSW provided education on relaxation techniques such as meditation, deep breathing, massage, grounding exercises or yoga that can activate the body's relaxation response and ease symptoms of stress and anxiety. LCSW ask that when pt is struggling with difficult emotions and racing thoughts that they start this relaxation response process. LCSW provided extensive education on healthy coping skills for anxiety. SW used active and reflective listening, validated patient's feelings/concerns, and provided emotional support. Patient will work on implementing appropriate self-care habits into their daily routine such as: staying positive, writing a gratitude list, drinking water, staying active around the house, taking their medications as prescribed, combating negative thoughts or emotions and staying connected with their family and friends. Positive reinforcement provided for this decision to work on this. LCSW provided education on healthy sleep hygiene and what that looks like. LCSW encouraged patient to implement a night time routine into their schedule that works best for them and that they are able to maintain. Advised patient to implement deep breathing/grounding/meditation/self-care exercises into their nightly routine to combat racing thoughts at night. LCSW encouraged patient to wake up at the same time each day, make their sleeping environment comfortable, exercise when able, to limit naps and to not eat or drink anything right before bed.  Motivational Interviewing employed Depression screen reviewed  PHQ2/ PHQ9 completed or reviewed  Mindfulness or Relaxation training provided Active listening / Reflection utilized  Advance Care and HCPOA education provided Emotional Support Provided Problem Solving /Task Center strategies reviewed Provided psychoeducation for  mental health needs  Provided brief CBT  Reviewed mental health medications and discussed importance of compliance:  Quality of sleep assessed & Sleep Hygiene techniques promoted  Participation in counseling encouraged  Verbalization of feelings encouraged  Suicidal Ideation/Homicidal Ideation assessed: Patient denies SI/HI  Review resources, discussed options and provided patient information about  Mental Health Resources Inter-disciplinary care team collaboration (see longitudinal plan of care) Patient Goals/Self-Care Activities: Over the next 120 days Attend scheduled medical appointments Utilize healthy coping skills and supportive resources discussed Contact PCP with any questions or concerns Keep 90 percent of counseling appointments Call your insurance provider for more information about your Enhanced Benefits  Check out counseling resources provided  Begin personal counseling with LCSW, to reduce and manage symptoms of Depression and Stress, until well-established with mental health provider Accept all  calls from representative with [facility] in an effort to establish ongoing mental health counseling and supportive services. Incorporate into daily practice - relaxation techniques, deep breathing exercises, and mindfulness meditation strategies. Talk about feelings with friends, family members, spiritual advisor, etc. Contact LCSW directly [phone]), if you have questions, need assistance, or if additional social work needs are identified between now and our next scheduled telephone outreach call. Call 988 for mental health hotline/crisis line if needed (24/7 available) Try techniques to reduce symptoms of anxiety/negative thinking (deep breathing, distraction, positive self talk, etc)  - develop a personal safety plan - develop a plan to deal with triggers like holidays, anniversaries - exercise at least 2 to 3 times per week - have a plan for how to handle bad days -  journal feelings and what helps to feel better or worse - spend time or talk with others at least 2 to 3 times per week - watch for early signs of feeling worse - begin personal counseling - call and visit an old friend - check out volunteer opportunities - join a support group - laugh; watch a funny movie or comedian - learn and use visualization or guided imagery - perform a random act of kindness - practice relaxation or meditation daily - start or continue a personal journal - practice positive thinking and self-talk -continue with compliance of taking medication  -identify current effective and ineffective coping strategies.  -implement positive self-talk in care to increase self-esteem, confidence and feelings of control.  -consider alternative and complementary therapy approaches such as meditation, mindfulness or yoga.  -journaling, prayer, worship services, meditation or pastoral counseling.  -increase participation in pleasurable group activities such as hobbies, singing, sports or volunteering).  -consider the use of meditative movement therapy such as tai chi, yoga or qigong.  -start a regular daily exercise program based on tolerance, ability and patient choice to support positive thinking and activity    If you are experiencing a Mental Health or Behavioral Health Crisis or need someone to talk to, please call the Suicide and Crisis Lifeline: 988      24- Hour Availability:    [facility]  [address] [place], [place] [phone] Crisis [phone]   [facility] [phone]   [place] [facility]  [phone]    [facility]  [phone] (after hours)   Therapeutic Alternative/Mobile Crisis   [phone]   [place] National Suicide Hotline  641-657-9263 (TALK) OR 988   Call 911 or go to emergency room   [facility]  [phone]);  [place] and [employer]  [phone]); [place], [place], [place], [place], [place], [place], [place]        10 LITTLE Things To Do When You're Feeling Too Down To Do Anything  Take a shower. Even if you plan to stay in all day long and not see a soul, take a shower. It takes the most effort to hop in to the shower but once you do, you'll feel immediate results. It will wake you up and you'll be feeling much fresher (and cleaner too).  Brush and floss your teeth. Give your teeth a good brushing with a floss finish. It's a small task but it feels so good and you can check 'taking care of your health' off the list of things to do.  Do something small on your list. Most of [place] have some small thing we would  like to get done (load of laundry, sew a button, email a friend). Doing one of these things will make you feel like you've accomplished something.  Drink water. Drinking water is easy right? It's also really beneficial for your health so keep a glass beside you all day and take sips often. It gives you energy and prevents you from boredom eating.  Do some floor exercises. The last thing you want to do is exercise but it might be just the thing you need the most. Keep it simple and do exercises that involve sitting or laying on the floor. Even the smallest of exercises release chemicals in the brain that make you feel good. Yoga stretches or core exercises are going to make you feel good with minimal effort.  Make your bed. Making your bed takes a few minutes but it's productive and you'll feel relieved when it's done. An unmade bed is a huge visual reminder that you're having an unproductive day. Do it and consider it your housework for the day.  Put on some nice clothes. Take the sweatpants off even if you don't plan to go anywhere. Put on clothes that make you feel good. Take a look in the mirror so your brain recognizes the sweatpants have been replaced with clothes that make you look  great. It's an instant confidence booster.  Wash the dishes. A pile of dirty dishes in the sink is a reflection of your mood. It's possible that if you wash up the dishes, your mood will follow suit. It's worth a try.  Cook a real meal. If you have the luxury to have a "do nothing" day, you have time to make a real meal for yourself. Make a meal that you love to eat. The process is good to get you out of the funk and the food will ensure you have more energy for tomorrow.  Write out your thoughts by hand. When you hand write, you stimulate your brain to focus on the moment that you're in so make yourself comfortable and write whatever comes into your mind. Put those thoughts out on paper so they stop spinning around in your head. Those thoughts might be the very thing holding you down.    Patient Goals: Follow up goal     Follow up:  Patient agrees to Care Plan and Follow-up.  Plan: The Managed Medicaid care management team will reach out to the patient again over the next 30 days.  Dickie La, BSW, MSW, [employer] Managed Medicaid LCSW [facility]  [facility] [email or web address] Phone: [phone]

## 2022-12-13 NOTE — Patient Instructions (Signed)
Visit Information  Ms. Giuffre was given information about Medicaid Managed Care team care coordination services as a part of their [facility] Medicaid benefit. [name] verbally consented to engagement with the [facility] Managed Care team.   If you are experiencing a medical emergency, please call 911 or report to your local emergency department or urgent care.   If you have a non-emergency medical problem during routine business hours, please contact your provider's office and ask to speak with a nurse.   For questions related to your [facility] health plan, please call: [phone] or go here:[email or web address]  If you would like to schedule transportation through your [facility] plan, please call the following number at least 2 days in advance of your appointment: [phone].  You can also use the MTM portal or MTM mobile app to manage your rides. For the portal, please go to [email or web address]  Call the [facility] Crisis Line at [phone], at any time, 24 hours a day, 7 days a week. If you are in danger or need immediate medical attention call 911.  If you would like help to quit smoking, call 1-800-QUIT-NOW (504-250-7870) OR Espaol: 1-855-Djelo-Ya (8-756-433-2951) o para ms informacin haga clic aqu or Text READY to 884-166 to register via text  Following is a copy of your plan of care:  Care Plan : LCSW Plan of Care  Updates made by Gustavus Bryant, LCSW since 12/13/2022 12:00 AM     Problem: Anxiety Identification (Anxiety)      Long-Range Goal: Anxiety Symptoms Identified   Start Date: 10/06/2022  Note:   Priority: High  Timeframe:  Long-Range Goal Priority:  High Start Date:   10/06/22      Expected End Date:  ongoing                     Follow Up Date--12/22/22 at 1 pm  - keep 90 percent of scheduled appointments -consider counseling or psychiatry -consider bumping up your self-care  -consider creating a stronger support  network   Why is this important?             Combatting depression may take some time.            If you don't feel better right away, don't give up on your treatment plan.    Current barriers:   Chronic Mental Health needs related to addiction, PTSD, depression, stress and anxiety. Patient requires Support, Education, Resources, Referrals, Advocacy, and Care Coordination, in order to meet Unmet Mental Health Needs. Patient has a history of trauma and addiction Patient will implement clinical interventions discussed today to decrease symptoms of depression and increase knowledge and/or ability of: coping skills. Mental Health Concerns and Social Isolation Patient lacks knowledge of available community counseling agencies and resources. Ongoing issues with insomnia   Clinical Goal(s): verbalize understanding of plan for management of PTSD, Anxiety, Depression, and Stress and demonstrate a reduction in symptoms. Patient will connect with a provider for ongoing mental health treatment, increase coping skills, healthy habits, self-management skills, and stress reduction      Patient Goals/Self-Care Activities: Over the next 120 days Attend scheduled medical appointments Utilize healthy coping skills and supportive resources discussed Contact PCP with any questions or concerns Keep 90 percent of counseling appointments Call your insurance provider for more information about your Enhanced Benefits  Check out counseling resources provided  Begin personal counseling with LCSW, to reduce and manage symptoms of Depression and Stress, until well-established with mental  health provider Accept all calls from representative with [facility] in an effort to establish ongoing mental health counseling and supportive services. Incorporate into daily practice - relaxation techniques, deep breathing exercises, and mindfulness meditation strategies. Talk about feelings with friends, family members, spiritual  advisor, etc. Contact LCSW directly [phone]), if you have questions, need assistance, or if additional social work needs are identified between now and our next scheduled telephone outreach call. Call 988 for mental health hotline/crisis line if needed (24/7 available) Try techniques to reduce symptoms of anxiety/negative thinking (deep breathing, distraction, positive self talk, etc)  - develop a personal safety plan - develop a plan to deal with triggers like holidays, anniversaries - exercise at least 2 to 3 times per week - have a plan for how to handle bad days - journal feelings and what helps to feel better or worse - spend time or talk with others at least 2 to 3 times per week - watch for early signs of feeling worse - begin personal counseling - call and visit an old friend - check out volunteer opportunities - join a support group - laugh; watch a funny movie or comedian - learn and use visualization or guided imagery - perform a random act of kindness - practice relaxation or meditation daily - start or continue a personal journal - practice positive thinking and self-talk -continue with compliance of taking medication  -identify current effective and ineffective coping strategies.  -implement positive self-talk in care to increase self-esteem, confidence and feelings of control.  -consider alternative and complementary therapy approaches such as meditation, mindfulness or yoga.  -journaling, prayer, worship services, meditation or pastoral counseling.  -increase participation in pleasurable group activities such as hobbies, singing, sports or volunteering).  -consider the use of meditative movement therapy such as tai chi, yoga or qigong.  -start a regular daily exercise program based on tolerance, ability and patient choice to support positive thinking and activity    If you are experiencing a Mental Health or Behavioral Health Crisis or need someone to talk to,  please call the Suicide and Crisis Lifeline: 988      24- Hour Availability:    [facility]  [address] [place], [place] [phone] Crisis [phone]   [facility] [phone]   [place] [facility]  [phone]    [facility]  [phone] (after hours)   Therapeutic Alternative/Mobile Crisis   [phone]   [place] National Suicide Hotline  629-341-2287 (TALK) OR 988   Call 911 or go to emergency room   [facility]  [phone]);  [place] and [employer]  [phone]); [place], [place], [place], [place], [place], [place], [place]        10 LITTLE Things To Do When You're Feeling Too Down To Do Anything  Take a shower. Even if you plan to stay in all day long and not see a soul, take a shower. It takes the most effort to hop in to the shower but once you do, you'll feel immediate results. It will wake you up and you'll be feeling much fresher (and cleaner too).  Brush and floss your teeth. Give your teeth a good brushing with a floss finish. It's a small task but it feels so good and you can check 'taking care of your health' off the list of things to do.  Do something small on your list. Most of [place] have some  small thing we would like to get done (load of laundry, sew a button, email a friend). Doing one of these things will make you feel like you've accomplished something.  Drink water. Drinking water is easy right? It's also really beneficial for your health so keep a glass beside you all day and take sips often. It gives you energy and prevents you from boredom eating.  Do some floor exercises. The last thing you want to do is exercise but it might be just the thing you need the most. Keep it simple and do exercises that involve sitting or laying on the floor. Even the smallest of exercises release chemicals in the brain that make you  feel good. Yoga stretches or core exercises are going to make you feel good with minimal effort.  Make your bed. Making your bed takes a few minutes but it's productive and you'll feel relieved when it's done. An unmade bed is a huge visual reminder that you're having an unproductive day. Do it and consider it your housework for the day.  Put on some nice clothes. Take the sweatpants off even if you don't plan to go anywhere. Put on clothes that make you feel good. Take a look in the mirror so your brain recognizes the sweatpants have been replaced with clothes that make you look great. It's an instant confidence booster.  Wash the dishes. A pile of dirty dishes in the sink is a reflection of your mood. It's possible that if you wash up the dishes, your mood will follow suit. It's worth a try.  Cook a real meal. If you have the luxury to have a "do nothing" day, you have time to make a real meal for yourself. Make a meal that you love to eat. The process is good to get you out of the funk and the food will ensure you have more energy for tomorrow.  Write out your thoughts by hand. When you hand write, you stimulate your brain to focus on the moment that you're in so make yourself comfortable and write whatever comes into your mind. Put those thoughts out on paper so they stop spinning around in your head. Those thoughts might be the very thing holding you down.    Patient Goals: Follow up goal

## 2022-12-14 DIAGNOSIS — I5032 Chronic diastolic (congestive) heart failure: Secondary | ICD-10-CM | POA: Diagnosis not present

## 2022-12-15 ENCOUNTER — Ambulatory Visit (INDEPENDENT_AMBULATORY_CARE_PROVIDER_SITE_OTHER): Payer: Medicaid Other | Admitting: Family Medicine

## 2022-12-15 ENCOUNTER — Encounter: Payer: Self-pay | Admitting: Family Medicine

## 2022-12-15 VITALS — BP 124/70 | HR 63 | Temp 97.4°F | Ht 69.0 in | Wt 359.0 lb

## 2022-12-15 DIAGNOSIS — R110 Nausea: Secondary | ICD-10-CM | POA: Diagnosis not present

## 2022-12-15 DIAGNOSIS — I10 Essential (primary) hypertension: Secondary | ICD-10-CM | POA: Diagnosis not present

## 2022-12-15 DIAGNOSIS — I5032 Chronic diastolic (congestive) heart failure: Secondary | ICD-10-CM | POA: Insufficient documentation

## 2022-12-15 DIAGNOSIS — G4700 Insomnia, unspecified: Secondary | ICD-10-CM

## 2022-12-15 DIAGNOSIS — F39 Unspecified mood [affective] disorder: Secondary | ICD-10-CM | POA: Insufficient documentation

## 2022-12-15 MED ORDER — ONDANSETRON 4 MG PO TBDP: ORAL_TABLET | ORAL | 0 refills | Status: DC

## 2022-12-15 MED ORDER — ESZOPICLONE 1 MG PO TABS: 1.0000 mg | ORAL_TABLET | Freq: Every evening | ORAL | 0 refills | Status: DC | PRN

## 2022-12-15 NOTE — Patient Instructions (Addendum)
Stop Furosemide.  Referral placed.  Medication for sleep as prescribed.  Take care  Dr. Adriana Simas

## 2022-12-18 DIAGNOSIS — G4700 Insomnia, unspecified: Secondary | ICD-10-CM | POA: Insufficient documentation

## 2022-12-18 DIAGNOSIS — I5032 Chronic diastolic (congestive) heart failure: Secondary | ICD-10-CM | POA: Diagnosis not present

## 2022-12-18 NOTE — Assessment & Plan Note (Signed)
-   Referring to healthy weight and wellness 

## 2022-12-18 NOTE — Assessment & Plan Note (Signed)
Trial of Lunesta

## 2022-12-18 NOTE — Progress Notes (Signed)
Subjective:  Patient ID: Mary [name], female    DOB: 09/16/1980  Age: 42 y.o. MRN: [id number]  CC: Chief Complaint  Patient presents with   Dizziness    And passing out twice in the past month , last episode 2 weeks ago    weight concern   Back Pain   Anxiety    Ptsd seeing psych in May    HPI:  42 year old female presents for follow-up.  Patient has had some recent episodes of dizziness.  No syncope.  It appears that she has been taking both torsemide and Lasix.  I am unsure why.  She does have a history of diastolic CHF.  Will discuss this today.  Patient states that she will be seeing a mental health provider in the near future.  She is currently on Celexa and hydroxyzine.  Patient states that she is having significant difficulty sleeping.  She states that the trazodone does not help.  She states that Valium has worked for her in the past.  She would like to discuss treatment options today.  Patient also very concerned about her weight.  She has seen bariatric surgery but was found to not be a good surgical candidate.  Her insurance does not allow for GLP-1 medications for weight loss.  Will discuss treatment options today.  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   COPD (chronic obstructive pulmonary disease) (HCC) 02/03/2014   PTSD (post-traumatic stress disorder) 02/03/2014    Social Hx   Social History   Socioeconomic History   Marital status: Significant Other    Spouse name: Not on file   Number of children: 3   Years of education:  12   Highest education level: High school graduate  Occupational History   Occupation: Seeking disability  Tobacco Use   Smoking status: Never   Smokeless tobacco: Never  Vaping Use   Vaping Use: Never used  Substance and Sexual Activity   Alcohol use: No   Drug use: Yes    Types: Marijuana   Sexual activity: Not Currently    Birth control/protection: Surgical    Comment: hyst  Other Topics Concern   Not on file  Social History Narrative   Lives at home with father.   Right-handed.   No daily caffeine per day.   Social Determinants of Health   Financial Resource Strain: High Risk (01/03/2022)   Overall Financial Resource Strain (CARDIA)    Difficulty of Paying Living Expenses: Very hard  Food Insecurity: Food Insecurity Present (08/28/2022)   Hunger Vital Sign    Worried About Running Out of Food in the Last Year: Often true    Ran Out of Food in the Last Year: Often true  Transportation Needs: No Transportation Needs (08/28/2022)   PRAPARE - Administrator, Civil Service (Medical): No    Lack of Transportation (Non-Medical): No  Physical Activity: Insufficiently Active (01/03/2022)   Exercise Vital Sign    Days of Exercise per Week: 3 days    Minutes of Exercise per Session: 40 min  Stress: No  Stress Concern Present (12/12/2022)   [employer] of Occupational Health - Occupational Stress Questionnaire    Feeling of Stress : Only a little  Recent Concern: Stress - Stress Concern Present (11/20/2022)   [employer] of Occupational Health - Occupational Stress Questionnaire    Feeling of Stress : Rather much  Social Connections: Moderately Integrated (01/03/2022)   Social Connection and Isolation Panel [NHANES]    Frequency of Communication with Friends and Family: More than three times a week    Frequency of Social Gatherings with Friends and Family: More than three times a week    Attends Religious Services: More than 4 times per year    Active Member of  [employer] or Organizations: No    Attends Engineer, structural: Never    Marital Status: Living with partner    Review of Systems Per HPI  Objective:  BP 124/70   Pulse 63   Temp (!) 97.4 F (36.3 C)   Ht 5\' 9"  (1.753 m)   Wt (!) 359 lb (162.8 kg)   SpO2 98%   BMI 53.02 kg/m      12/15/2022   10:58 AM 11/24/2022   10:29 AM 11/14/2022   10:23 PM  BP/Weight  Systolic BP 124 139 125  Diastolic BP 70 87 88  Wt. (Lbs) 359 355.8   BMI 53.02 kg/m2 52.54 kg/m2     Physical Exam Vitals and nursing note reviewed.  Constitutional:      General: She is not in acute distress.    Appearance: Normal appearance. She is obese.  HENT:     Head: Normocephalic and atraumatic.  Cardiovascular:     Rate and Rhythm: Normal rate and regular rhythm.  Pulmonary:     Effort: Pulmonary effort is normal.     Breath sounds: Normal breath sounds. No wheezing, rhonchi or rales.  Neurological:     Mental Status: She is alert.     Lab Results  Component Value Date   WBC 9.9 11/14/2022   HGB 14.0 11/14/2022   HCT 42.4 11/14/2022   PLT 233 11/14/2022   GLUCOSE 85 11/30/2022   CHOL 160 12/27/2021   TRIG 76 12/27/2021   HDL 51 12/27/2021   LDLCALC 94 12/27/2021   ALT 23 03/09/2022   AST 16 03/09/2022   NA 143 11/30/2022   K 4.1 11/30/2022   CL 103 11/30/2022   CREATININE 1.00 11/30/2022   BUN 8 11/30/2022   CO2 27 11/30/2022   TSH 2.060 03/09/2022   HGBA1C 5.3 11/30/2022     Assessment & Plan:   Problem List Items Addressed This Visit       Cardiovascular and Mediastinum   Chronic diastolic CHF (congestive heart failure) (HCC) - Primary    Stop Lasix.  Continue torsemide.      Primary hypertension    Well-controlled.  Continue losartan.  Continue torsemide.        Other   Insomnia    Trial of Lunesta.      Morbid obesity (HCC)    Referring to healthy weight and wellness.      Relevant Orders   Amb Ref to Medical Weight Management   Other Visit Diagnoses      Nausea       Relevant Medications   ondansetron (ZOFRAN-ODT) 4 MG disintegrating tablet       Meds ordered this encounter  Medications   ondansetron (ZOFRAN-ODT) 4 MG disintegrating tablet    Sig: TAKE 1 TABLET EVERY 8  HOURS AS NEEDED FOR NAUSEA OR VOMITING    Dispense:  20 tablet    Refill:  0   eszopiclone (LUNESTA) 1 MG TABS tablet    Sig: Take 1 tablet (1 mg total) by mouth at bedtime as needed for sleep. Take immediately before bedtime    Dispense:  30 tablet    Refill:  0   Makenzye Troutman DO [facility]

## 2022-12-18 NOTE — Assessment & Plan Note (Signed)
Well-controlled.  Continue losartan.  Continue torsemide.

## 2022-12-18 NOTE — Assessment & Plan Note (Signed)
Stop Lasix.  Continue torsemide.

## 2022-12-19 DIAGNOSIS — I5032 Chronic diastolic (congestive) heart failure: Secondary | ICD-10-CM | POA: Diagnosis not present

## 2022-12-20 ENCOUNTER — Ambulatory Visit (HOSPITAL_COMMUNITY): Payer: Medicaid Other | Admitting: Psychiatry

## 2022-12-20 ENCOUNTER — Telehealth (HOSPITAL_COMMUNITY): Payer: Self-pay | Admitting: Psychiatry

## 2022-12-20 DIAGNOSIS — I5032 Chronic diastolic (congestive) heart failure: Secondary | ICD-10-CM | POA: Diagnosis not present

## 2022-12-20 DIAGNOSIS — Z419 Encounter for procedure for purposes other than remedying health state, unspecified: Secondary | ICD-10-CM | POA: Diagnosis not present

## 2022-12-20 NOTE — Telephone Encounter (Signed)
Therapist attempted to contact patient via phone regarding scheduled in office appointment and received voicemail recording.  Therapist left message indicating attempt and requesting patient call office. 

## 2022-12-21 DIAGNOSIS — I5032 Chronic diastolic (congestive) heart failure: Secondary | ICD-10-CM | POA: Diagnosis not present

## 2022-12-22 ENCOUNTER — Ambulatory Visit: Payer: Medicaid Other | Admitting: Licensed Clinical Social Worker

## 2022-12-22 DIAGNOSIS — I5032 Chronic diastolic (congestive) heart failure: Secondary | ICD-10-CM | POA: Diagnosis not present

## 2022-12-25 ENCOUNTER — Telehealth: Payer: Self-pay | Admitting: Licensed Clinical Social Worker

## 2022-12-25 DIAGNOSIS — I5032 Chronic diastolic (congestive) heart failure: Secondary | ICD-10-CM | POA: Diagnosis not present

## 2022-12-25 NOTE — Patient Instructions (Signed)
Visit Information  Ms. Bushell was given information about Medicaid Managed Care team care coordination services as a part of their [facility] Medicaid benefit. [name] verbally consented to engagement with the [facility] Managed Care team.   If you are experiencing a medical emergency, please call 911 or report to your local emergency department or urgent care.   If you have a non-emergency medical problem during routine business hours, please contact your provider's office and ask to speak with a nurse.   For questions related to your [facility] health plan, please call: [phone] or go here:[email or web address]  If you would like to schedule transportation through your [employer] plan, please call the following number at least 2 days in advance of your appointment: [phone].   You can also use the MTM portal or MTM mobile app to manage your rides. Reimbursement for transportation is available through [facility]! For the portal, please go to [email or web address]  Call the [facility] Crisis Line at [phone], at any time, 24 hours a day, 7 days a week. If you are in danger or need immediate medical attention call 911.  If you would like help to quit smoking, call 1-800-QUIT-NOW (972-470-7486) OR Espaol: 1-855-Djelo-Ya (4-403-474-2595) o para ms informacin haga clic aqu or Text READY to 638-756 to register via text  Following is a copy of your plan of care:  Care Plan : LCSW Plan of Care  Updates made by Gustavus Bryant, LCSW since 12/25/2022 12:00 AM     Problem: Anxiety Identification (Anxiety)      Long-Range Goal: Anxiety Symptoms Identified   Start Date: 10/06/2022  Note:   Priority: High  Timeframe:  Long-Range Goal Priority:  High Start Date:   10/06/22      Expected End Date:  ongoing                     Follow Up Date--01/16/23 at 1 pm  - keep 90 percent of scheduled appointments -consider counseling or psychiatry -consider  bumping up your self-care  -consider creating a stronger support network   Why is this important?             Combatting depression may take some time.            If you don't feel better right away, don't give up on your treatment plan.    Current barriers:   Chronic Mental Health needs related to addiction, PTSD, depression, stress and anxiety. Patient requires Support, Education, Resources, Referrals, Advocacy, and Care Coordination, in order to meet Unmet Mental Health Needs. Patient has a history of trauma and addiction Patient will implement clinical interventions discussed today to decrease symptoms of depression and increase knowledge and/or ability of: coping skills. Mental Health Concerns and Social Isolation Patient lacks knowledge of available community counseling agencies and resources. Ongoing issues with insomnia   Clinical Goal(s): verbalize understanding of plan for management of PTSD, Anxiety, Depression, and Stress and demonstrate a reduction in symptoms. Patient will connect with a provider for ongoing mental health treatment, increase coping skills, healthy habits, self-management skills, and stress reduction        Clinical Interventions:  Assessed patient's previous and current treatment, coping skills, support system and barriers to care. Patient provided hx  Verbalization of feelings encouraged, motivational interviewing employed Emotional support provided, positive coping strategies explored. Establishing healthy boundaries emphasized and healthy self-care education provided Patient was educated on available mental health resources within their area that accept Medicaid and  offer counseling and psychiatry. Patient is agreeable to referral to [facility]  for counseling at [facility] at [facility] and understands that an additional` referral will have to be made later once they start accepting new clients again for psychiatry. [facility] LCSW made referral for  counseling only on 10/06/22. LCSW 10/17/22 update- [facility] LCSW spoke with [facility] staff and was informed that they are booked out until May of 2024. Patient was provided this update but had to take another call that was coming in from her PCP office. [facility] LCSW sent email to patient with resources for her to review until next week's follow up social work appointment. [facility] LCSW 11/01/22 update- Patient reports that she has received great news recently that she will become a grandmother this year. She reports that she continues to remain free of illicit substance and alcohol use. Positive reinforcement provided for this successful self-care implementation. [facility] LCSW was able to communicate to [facility] and get patient scheduled for therapy. However, patient is still in need of finding a psychiatrist. Email was sent to patient with resources for her to review but patient is strongly considering going back to [facility] as it is in her county. 11/20/22 update - Patient has an upcoming initial counseling appointment on 12/20/22 at 3 pm at [facility]. 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 Patient is going to the gym at [employer] and even has a staff member there that is assisting her with personal training. Patient had a recent ED visit but overall is implementing healthy self-care tools into her daily routine to promote healthy living. Patient was provided positive reinforcement for ACTIVELY working on patient's mental and physical health. Patient is agreeable to contact [facility] at [place], [place] to get scheduled with a psychiatrist as well since she was only scheduled with a counselor which may be due to staff shortage. 12/13/22 update- patient was able to get scheduled with a counselor but wishes to wait before gaining psychiatry until she is established at [facility] at [place]. Patient's mood have  lifted and improved with ongoing self-care implementation. Update- Patient has been successfully scheduled with psychiatry and [facility] at [place].  Email with crisis support resources and [facility]'s walk in clinic hours was sent as well  LCSW provided education on relaxation techniques such as meditation, deep breathing, massage, grounding exercises or yoga that can activate the body's relaxation response and ease symptoms of stress and anxiety. LCSW ask that when pt is struggling with difficult emotions and racing thoughts that they start this relaxation response process. LCSW provided extensive education on healthy coping skills for anxiety. SW used active and reflective listening, validated patient's feelings/concerns, and provided emotional support. Patient will work on implementing appropriate self-care habits into their daily routine such as: staying positive, writing a gratitude list, drinking water, staying active around the house, taking their medications as prescribed, combating negative thoughts or emotions and staying connected with their family and friends. Positive reinforcement provided for this decision to work on this. LCSW provided education on healthy sleep hygiene and what that looks like. LCSW encouraged patient to implement a night time routine into their schedule that works best for them and that they are able to maintain. Advised patient to implement deep breathing/grounding/meditation/self-care exercises into their nightly routine to combat racing thoughts at night. LCSW encouraged patient to wake up at  the same time each day, make their sleeping environment comfortable, exercise when able, to limit naps and to not eat or drink anything right before bed.  Motivational Interviewing employed Depression screen reviewed  PHQ2/ PHQ9 completed or reviewed  Mindfulness or Relaxation training provided Active listening / Reflection utilized  Advance Care and HCPOA education  provided Emotional Support Provided Problem Solving /Task Center strategies reviewed Provided psychoeducation for mental health needs  Provided brief CBT  Reviewed mental health medications and discussed importance of compliance:  Quality of sleep assessed & Sleep Hygiene techniques promoted  Participation in counseling encouraged  Verbalization of feelings encouraged  Suicidal Ideation/Homicidal Ideation assessed: Patient denies SI/HI  Review resources, discussed options and provided patient information about  Mental Health Resources Inter-disciplinary care team collaboration (see longitudinal plan of care) Patient Goals/Self-Care Activities: Over the next 120 days Attend scheduled medical appointments Utilize healthy coping skills and supportive resources discussed Contact PCP with any questions or concerns Keep 90 percent of counseling appointments Call your insurance provider for more information about your Enhanced Benefits  Check out counseling resources provided  Begin personal counseling with LCSW, to reduce and manage symptoms of Depression and Stress, until well-established with mental health provider Accept all calls from representative with [place] in an effort to establish ongoing mental health counseling and supportive services. Incorporate into daily practice - relaxation techniques, deep breathing exercises, and mindfulness meditation strategies. Talk about feelings with friends, family members, spiritual advisor, etc. Contact LCSW directly [phone]), if you have questions, need assistance, or if additional social work needs are identified between now and our next scheduled telephone outreach call. Call 988 for mental health hotline/crisis line if needed (24/7 available) Try techniques to reduce symptoms of anxiety/negative thinking (deep breathing, distraction, positive self talk, etc)  - develop a personal safety plan - develop a plan to deal with triggers like  holidays, anniversaries - exercise at least 2 to 3 times per week - have a plan for how to handle bad days - journal feelings and what helps to feel better or worse - spend time or talk with others at least 2 to 3 times per week - watch for early signs of feeling worse - begin personal counseling - call and visit an old friend - check out volunteer opportunities - join a support group - laugh; watch a funny movie or comedian - learn and use visualization or guided imagery - perform a random act of kindness - practice relaxation or meditation daily - start or continue a personal journal - practice positive thinking and self-talk -continue with compliance of taking medication  -identify current effective and ineffective coping strategies.  -implement positive self-talk in care to increase self-esteem, confidence and feelings of control.  -consider alternative and complementary therapy approaches such as meditation, mindfulness or yoga.  -journaling, prayer, worship services, meditation or pastoral counseling.  -increase participation in pleasurable group activities such as hobbies, singing, sports or volunteering).  -consider the use of meditative movement therapy such as tai chi, yoga or qigong.  -start a regular daily exercise program based on tolerance, ability and patient choice to support positive thinking and activity    If you are experiencing a Mental Health or Behavioral Health Crisis or need someone to talk to, please call the Suicide and Crisis Lifeline: 988      24- Hour Availability:    [facility]  [address] [place], [place] [phone] Crisis [phone]   [facility]  the [facility] [phone]   [facility] Crisis Service  [phone]    [facility]  [phone] (after hours)   Therapeutic Alternative/Mobile Crisis   [phone]   [place] National Suicide Hotline  519-363-4426  Len Childs) [place] [address]   Call 911 or go to emergency room   [facility]  [phone]);  [place] and [employer]  [phone]); [place], [place], [place], [place], [place], [place], [place]        10 LITTLE Things To Do When You're Feeling Too Down To Do Anything  Take a shower. Even if you plan to stay in all day long and not see a soul, take a shower. It takes the most effort to hop in to the shower but once you do, you'll feel immediate results. It will wake you up and you'll be feeling much fresher (and cleaner too).  Brush and floss your teeth. Give your teeth a good brushing with a floss finish. It's a small task but it feels so good and you can check 'taking care of your health' off the list of things to do.  Do something small on your list. Most of [place] have some small thing we would like to get done (load of laundry, sew a button, email a friend). Doing one of these things will make you feel like you've accomplished something.  Drink water. Drinking water is easy right? It's also really beneficial for your health so keep a glass beside you all day and take sips often. It gives you energy and prevents you from boredom eating.  Do some floor exercises. The last thing you want to do is exercise but it might be just the thing you need the most. Keep it simple and do exercises that involve sitting or laying on the floor. Even the smallest of exercises release chemicals in the brain that make you feel good. Yoga stretches or core exercises are going to make you feel good with minimal effort.  Make your bed. Making your bed takes a few minutes but it's productive and you'll feel relieved when it's done. An unmade bed is a huge visual reminder that you're having an unproductive day. Do it and consider it your housework for the day.  Put on some nice clothes. Take the sweatpants off even if you don't plan to go anywhere. Put on clothes that make you feel good.  Take a look in the mirror so your brain recognizes the sweatpants have been replaced with clothes that make you look great. It's an instant confidence booster.  Wash the dishes. A pile of dirty dishes in the sink is a reflection of your mood. It's possible that if you wash up the dishes, your mood will follow suit. It's worth a try.  Cook a real meal. If you have the luxury to have a "do nothing" day, you have time to make a real meal for yourself. Make a meal that you love to eat. The process is good to get you out of the funk and the food will ensure you have more energy for tomorrow.  Write out your thoughts by hand. When you hand write, you stimulate your brain to focus on the moment that you're in so make yourself comfortable and write whatever comes into your mind. Put those thoughts out on paper so they stop spinning around in your head. Those thoughts might be the very thing holding you down.    Patient Goals: Follow up goal

## 2022-12-25 NOTE — Patient Outreach (Addendum)
Medicaid Managed Care Social Work Note  12/22/2022 Name:  Mary [name] MRN:  [id number] DOB:  Nov 05, 1980  Mary [name] is an 42 y.o. year old female who is a primary patient of [name], DO.  The Medicaid Managed Care Coordination team was consulted for assistance with:  Mental Health Counseling and Resources  Ms. Parden was given information about Medicaid Managed Care Coordination team services today. [name] Patient agreed to services and verbal consent obtained.  Engaged with patient  for by telephone forfollow up visit in response to referral for case management and/or care coordination services.   Assessments/Interventions:  Review of past medical history, allergies, medications, health status, including review of consultants reports, laboratory and other test data, was performed as part of comprehensive evaluation and provision of chronic care management services.  SDOH: (Social Determinant of Health) assessments and interventions performed: SDOH Interventions    Flowsheet Row Telephone from 12/25/2022 in [place] POPULATION HEALTH DEPARTMENT Patient Outreach Telephone from 12/12/2022 in [place] POPULATION HEALTH DEPARTMENT Patient Outreach Telephone from 11/20/2022 in [place] POPULATION HEALTH DEPARTMENT Patient Outreach Telephone from 11/01/2022 in [place] POPULATION HEALTH DEPARTMENT Patient Outreach Telephone from 10/06/2022 in [place] POPULATION HEALTH DEPARTMENT Patient Outreach Telephone from 08/28/2022 in [place] POPULATION HEALTH DEPARTMENT  SDOH Interventions        Food Insecurity Interventions -- -- -- -- -- Other (Comment)  [BSW referral for local food pantries]  Housing Interventions -- -- -- -- -- Intervention Not Indicated  Transportation Interventions -- -- -- -- -- Intervention Not Indicated  Utilities Interventions -- -- -- -- -- Intervention Not Indicated  Depression Interventions/Treatment  -- -- -- -- Referral to  Psychiatry, Medication --  Stress Interventions Offered [employer], Provide Counseling Offered [employer] Resources, Provide Counseling Offered [facility] Resources, Provide Counseling  [Anxiety due to recent ED visit] Offered [employer], Provide Counseling Offered [employer] Resources, Provide Counseling --       Advanced Directives Status:  See Care Plan for related entries.  Care Plan                 Allergies  Allergen Reactions   Buprenorphine Hcl Anaphylaxis    "it stops my heart"   Morphine Anaphylaxis    i quit breathing    Morphine And Related Anaphylaxis    "it stops my heart"   Penicillins Anaphylaxis and Other (See Comments)    Has patient had a PCN reaction causing immediate rash, facial/tongue/throat swelling, SOB or lightheadedness with hypotension: Yes Has patient had a PCN reaction causing severe rash involving mucus membranes or skin necrosis: Yes Has patient had a PCN reaction that required hospitalization: Yes Has patient had a PCN reaction occurring within the last 10 years: No If all of the above answers are "NO", then may proceed with Cephalosporin use.    Gabapentin Other (See Comments)    Per patient "hallucination"   Nsaids    Sulfa Antibiotics     Other reaction(s): Unknown   Ibuprofen-Acetaminophen Nausea And Vomiting   Latex Rash   Tylenol [Acetaminophen] Nausea And Vomiting    Medications Reviewed Today     Reviewed by Nyoka Cowden, LPN (Licensed Practical Nurse) on 12/15/22 at 1100  Med List Status: <None>   Medication Order Taking? Sig Documenting Provider Last Dose Status Informant  albuterol (VENTOLIN HFA) 108 (90 Base) MCG/ACT inhaler [id number]  Inhale 2 puffs into the lungs every 6 (six) hours  as needed for wheezing or shortness of breath. Cook, Jayce G, DO  Active   Atogepant (QULIPTA) 10 MG TABS [id number]  Take 10 mg by mouth daily. Sonny Masters, FNP  Active   celecoxib  (CELEBREX) 200 MG capsule [id number]  Take 1 capsule (200 mg total) by mouth 2 (two) times daily. Vickki Hearing, MD  Active   Cholecalciferol (VITAMIN D3) 1000 units CAPS [id number]  Take 3,000 Units by mouth daily. [provider]  Active   citalopram (CELEXA) 20 MG tablet [id number]  TAKE ONE TABLET ONCE DAILY [name], DO  Active   dapagliflozin propanediol (FARXIGA) 10 MG TABS tablet [id number]  Take 1 tablet (10 mg total) by mouth daily before breakfast. Janetta Hora, PA-C  Active   doxycycline (VIBRA-TABS) 100 MG tablet [id number]  Take 1 tablet (100 mg total) by mouth 2 (two) times daily. Babs Sciara, MD  Active   DULERA 100-5 MCG/ACT AERO [id number]  INHALE 2 PUFFS TWICE DAILY Rakes, Doralee Albino, FNP  Active   fluticasone [facility]) 50 MCG/ACT nasal spray [id number]  1 SPRAY IN EACH NOSTRIL ONCE A DAY Cook, Jayce G, DO  Active   furosemide (LASIX) 20 MG tablet [id number]  Take 20 mg by mouth. [provider]  Active            Med Note (ROBB, MELANIE A   Fri Nov 10, 2022  3:33 PM) Patient reports taking a 40 mg and 20 mg tablet daily  hydrOXYzine (VISTARIL) 25 MG capsule [id number]  TAKE 1 CAPSULE EVERY 8 HOURS AS NEEDED Adriana Simas, Jayce G, DO  Active   levocetirizine (XYZAL) 5 MG tablet [id number]  Take 1 tablet (5 mg total) by mouth every evening. Ameduite, Alvino Chapel, FNP  Active   losartan (COZAAR) 100 MG tablet [id number]  100 mg one daily Luking, Scott A, MD  Active   melatonin 3 MG TABS tablet [id number]  Take 10 mg by mouth at bedtime. [provider]  Active   omeprazole (PRILOSEC) 40 MG capsule [id number]  Take 40 mg by mouth daily. [provider]  Active   ondansetron (ZOFRAN-ODT) 4 MG disintegrating tablet [id number]  TAKE 1 TABLET EVERY 8 HOURS AS NEEDED FOR NAUSEA OR VOMITING Cook, Jayce G, DO  Active   polyethylene glycol (MIRALAX / GLYCOLAX) 17 g packet [id number]  Take 17 g by mouth daily. Zadie Rhine, MD  Active   pregabalin (LYRICA) 75  MG capsule [id number]  One tid Babs Sciara, MD  Active   promethazine-dextromethorphan (PROMETHAZINE-DM) 6.25-15 MG/5ML syrup [id number]  Take 5 mLs by mouth 4 (four) times daily as needed. [name], DO  Active   tirzepatide [facility]) 2.5 MG/0.5ML Pen [id number]  Inject 2.5 mg into the skin once a week. Ameduite, Alvino Chapel, FNP  Active   torsemide (DEMADEX) 20 MG tablet [id number]  TAKE ONE TABLET ONCE DAILY [name], DO  Active   traZODone (DESYREL) 100 MG tablet [id number]  TAKE TWO TABLETS AT BEDTIME [name], [place]  Active             Patient Active Problem List   Diagnosis Date Noted   Insomnia 12/18/2022   Mood disorder (HCC) 12/15/2022   Chronic diastolic CHF (congestive heart failure) (HCC) 12/15/2022   OSA (obstructive sleep apnea) 03/16/2022   Chronic pain syndrome 05/24/2021   Allergic rhinitis 05/24/2021   Morbid obesity (HCC) 06/11/2020   Primary hypertension 06/11/2020   Migraine headache without aura 12/02/2019  History of cervical cancer 02/06/2018   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   COPD (chronic obstructive pulmonary disease) (HCC) 02/03/2014   PTSD (post-traumatic stress disorder) 02/03/2014    Conditions to be addressed/monitored per PCP order:  Anxiety  Care Plan : LCSW Plan of Care  Updates made by Gustavus Bryant, LCSW since 12/25/2022 12:00 AM     Problem: Anxiety Identification (Anxiety)      Long-Range Goal: Anxiety Symptoms Identified   Start Date: 10/06/2022  Note:   Priority: High  Timeframe:  Long-Range Goal Priority:  High Start Date:   10/06/22      Expected End Date:  ongoing                     Follow Up Date--01/16/23 at 1 pm  - keep 90 percent of scheduled appointments -consider counseling or psychiatry -consider bumping up your self-care  -consider creating a stronger support network   Why is this important?             Combatting depression may take some time.            If you don't feel better right away, don't give up  on your treatment plan.    Current barriers:   Chronic Mental Health needs related to addiction, PTSD, depression, stress and anxiety. Patient requires Support, Education, Resources, Referrals, Advocacy, and Care Coordination, in order to meet Unmet Mental Health Needs. Patient has a history of trauma and addiction Patient will implement clinical interventions discussed today to decrease symptoms of depression and increase knowledge and/or ability of: coping skills. Mental Health Concerns and Social Isolation Patient lacks knowledge of available community counseling agencies and resources. Ongoing issues with insomnia   Clinical Goal(s): verbalize understanding of plan for management of PTSD, Anxiety, Depression, and Stress and demonstrate a reduction in symptoms. Patient will connect with a provider for ongoing mental health treatment, increase coping skills, healthy habits, self-management skills, and stress reduction        Clinical Interventions:  Assessed patient's previous and current treatment, coping skills, support system and barriers to care. Patient provided hx  Verbalization of feelings encouraged, motivational interviewing employed Emotional support provided, positive coping strategies explored. Establishing healthy boundaries emphasized and healthy self-care education provided Patient was educated on available mental health resources within their area that accept Medicaid and offer counseling and psychiatry. Patient is agreeable to referral to [facility]  for counseling at [facility] and understands that an additional` referral will have to be made later once they start accepting new clients again for psychiatry. [facility] LCSW made referral for counseling only on 10/06/22. LCSW 10/17/22 update- [facility] LCSW spoke with [facility] staff and was informed that they are booked out until May of 2024. Patient was provided this update but had to  take another call that was coming in from her PCP office. [facility] LCSW sent email to patient with resources for her to review until next week's follow up social work appointment. [facility] LCSW 11/01/22 update- Patient reports that she has received great news recently that she will become a grandmother this year. She reports that she continues to remain free of illicit substance and alcohol use. Positive reinforcement provided for this successful self-care implementation. [facility] LCSW was able to communicate to [facility] and get patient scheduled for therapy. However, patient is still in need of finding a psychiatrist. Email was sent to patient with  resources for her to review but patient is strongly considering going back to [facility] as it is in her [place]. 11/20/22 update - Patient has an upcoming initial counseling appointment on 12/20/22 at 3 pm at [facility]. 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 Patient is going to the gym at [employer] and even has a staff member there that is assisting her with personal training. Patient had a recent ED visit but overall is implementing healthy self-care tools into her daily routine to promote healthy living. Patient was provided positive reinforcement for ACTIVELY working on patient's mental and physical health. Patient is agreeable to contact [facility] [place], [place] to get scheduled with a psychiatrist as well since she was only scheduled with a counselor which may be due to staff shortage. 12/13/22 update- patient was able to get scheduled with a counselor but wishes to wait before gaining psychiatry until she is established at [facility] [place]. Patient's mood have lifted and improved with ongoing self-care implementation. Update- Patient has been successfully scheduled with psychiatry and [facility] [place].  Email with crisis support resources and [facility]'s walk in clinic  hours was sent as well  LCSW provided education on relaxation techniques such as meditation, deep breathing, massage, grounding exercises or yoga that can activate the body's relaxation response and ease symptoms of stress and anxiety. LCSW ask that when pt is struggling with difficult emotions and racing thoughts that they start this relaxation response process. LCSW provided extensive education on healthy coping skills for anxiety. SW used active and reflective listening, validated patient's feelings/concerns, and provided emotional support. Patient will work on implementing appropriate self-care habits into their daily routine such as: staying positive, writing a gratitude list, drinking water, staying active around the house, taking their medications as prescribed, combating negative thoughts or emotions and staying connected with their family and friends. Positive reinforcement provided for this decision to work on this. LCSW provided education on healthy sleep hygiene and what that looks like. LCSW encouraged patient to implement a night time routine into their schedule that works best for them and that they are able to maintain. Advised patient to implement deep breathing/grounding/meditation/self-care exercises into their nightly routine to combat racing thoughts at night. LCSW encouraged patient to wake up at the same time each day, make their sleeping environment comfortable, exercise when able, to limit naps and to not eat or drink anything right before bed.  Motivational Interviewing employed Depression screen reviewed  PHQ2/ PHQ9 completed or reviewed  Mindfulness or Relaxation training provided Active listening / Reflection utilized  Advance Care and HCPOA education provided Emotional Support Provided Problem Solving /Task Center strategies reviewed Provided psychoeducation for mental health needs  Provided brief CBT  Reviewed mental health medications and discussed importance of  compliance:  Quality of sleep assessed & Sleep Hygiene techniques promoted  Participation in counseling encouraged  Verbalization of feelings encouraged  Suicidal Ideation/Homicidal Ideation assessed: Patient denies SI/HI  Review resources, discussed options and provided patient information about  Mental Health Resources Inter-disciplinary care team collaboration (see longitudinal plan of care) Patient Goals/Self-Care Activities: Over the next 120 days Attend scheduled medical appointments Utilize healthy coping skills and supportive resources discussed Contact PCP with any questions or concerns Keep 90 percent of counseling appointments Call your insurance provider for more information about your Enhanced Benefits  Check out counseling resources provided  Begin personal counseling with LCSW, to  reduce and manage symptoms of Depression and Stress, until well-established with mental health provider Accept all calls from representative with [place] in an effort to establish ongoing mental health counseling and supportive services. Incorporate into daily practice - relaxation techniques, deep breathing exercises, and mindfulness meditation strategies. Talk about feelings with friends, family members, spiritual advisor, etc. Contact LCSW directly [phone]), if you have questions, need assistance, or if additional social work needs are identified between now and our next scheduled telephone outreach call. Call 988 for mental health hotline/crisis line if needed (24/7 available) Try techniques to reduce symptoms of anxiety/negative thinking (deep breathing, distraction, positive self talk, etc)  - develop a personal safety plan - develop a plan to deal with triggers like holidays, anniversaries - exercise at least 2 to 3 times per week - have a plan for how to handle bad days - journal feelings and what helps to feel better or worse - spend time or talk with others at least 2 to 3  times per week - watch for early signs of feeling worse - begin personal counseling - call and visit an old friend - check out volunteer opportunities - join a support group - laugh; watch a funny movie or comedian - learn and use visualization or guided imagery - perform a random act of kindness - practice relaxation or meditation daily - start or continue a personal journal - practice positive thinking and self-talk -continue with compliance of taking medication  -identify current effective and ineffective coping strategies.  -implement positive self-talk in care to increase self-esteem, confidence and feelings of control.  -consider alternative and complementary therapy approaches such as meditation, mindfulness or yoga.  -journaling, prayer, worship services, meditation or pastoral counseling.  -increase participation in pleasurable group activities such as hobbies, singing, sports or volunteering).  -consider the use of meditative movement therapy such as tai chi, yoga or qigong.  -start a regular daily exercise program based on tolerance, ability and patient choice to support positive thinking and activity    If you are experiencing a Mental Health or Behavioral Health Crisis or need someone to talk to, please call the Suicide and Crisis Lifeline: 988      24- Hour Availability:    [facility]  [address] [place], [place] [phone] Crisis [phone]   [facility] [phone]   [facility]  [phone]    [facility]  [phone] (after hours)   Therapeutic Alternative/Mobile Crisis   [phone]   [place] National Suicide Hotline  636-378-4387 (TALK) OR 988   Call 911 or go to emergency room   [facility]  [phone]);  [place] and [employer]  [phone]); [place], [place], [place], [place], [place], [place], [place]         10 LITTLE Things To Do When You're Feeling Too Down To Do Anything  Take a shower. Even if you plan to stay in all day long and not see a soul, take a shower. It takes the most effort to hop in to the shower but once you do, you'll feel immediate results. It will wake you up and you'll be feeling much fresher (and cleaner too).  Brush and floss your teeth. Give your teeth a good brushing with a floss finish. It's a small task but it feels so good and you can check 'taking care of your health' off the list of things to do.  Do something small on your list. Most of [place] have some small thing we would like to get done (load of laundry, sew a button, email a friend). Doing one of these things will make you feel like you've accomplished something.  Drink water. Drinking water is easy right? It's also really beneficial for your health so keep a glass beside you all day and take sips often. It gives you energy and prevents you from boredom eating.  Do some floor exercises. The last thing you want to do is exercise but it might be just the thing you need the most. Keep it simple and do exercises that involve sitting or laying on the floor. Even the smallest of exercises release chemicals in the brain that make you feel good. Yoga stretches or core exercises are going to make you feel good with minimal effort.  Make your bed. Making your bed takes a few minutes but it's productive and you'll feel relieved when it's done. An unmade bed is a huge visual reminder that you're having an unproductive day. Do it and consider it your housework for the day.  Put on some nice clothes. Take the sweatpants off even if you don't plan to go anywhere. Put on clothes that make you feel good. Take a look in the mirror so your brain recognizes the sweatpants have been replaced with clothes that make you look great. It's an instant confidence booster.  Wash the dishes. A pile of dirty dishes in the sink is a  reflection of your mood. It's possible that if you wash up the dishes, your mood will follow suit. It's worth a try.  Cook a real meal. If you have the luxury to have a "do nothing" day, you have time to make a real meal for yourself. Make a meal that you love to eat. The process is good to get you out of the funk and the food will ensure you have more energy for tomorrow.  Write out your thoughts by hand. When you hand write, you stimulate your brain to focus on the moment that you're in so make yourself comfortable and write whatever comes into your mind. Put those thoughts out on paper so they stop spinning around in your head. Those thoughts might be the very thing holding you down.    Patient Goals: Follow up goal     Follow up:  Patient agrees to Care Plan and Follow-up.  Plan: The Managed Medicaid care management team will reach out to the patient again over the next 30 days.  Dickie La, BSW, MSW, [employer] Managed Medicaid LCSW [facility]  [email or web address] Phone: [phone]

## 2022-12-26 DIAGNOSIS — I5032 Chronic diastolic (congestive) heart failure: Secondary | ICD-10-CM | POA: Diagnosis not present

## 2022-12-27 DIAGNOSIS — I5032 Chronic diastolic (congestive) heart failure: Secondary | ICD-10-CM | POA: Diagnosis not present

## 2022-12-28 DIAGNOSIS — I5032 Chronic diastolic (congestive) heart failure: Secondary | ICD-10-CM | POA: Diagnosis not present

## 2022-12-29 DIAGNOSIS — I5032 Chronic diastolic (congestive) heart failure: Secondary | ICD-10-CM | POA: Diagnosis not present

## 2023-01-01 DIAGNOSIS — I5032 Chronic diastolic (congestive) heart failure: Secondary | ICD-10-CM | POA: Diagnosis not present

## 2023-01-02 DIAGNOSIS — I5032 Chronic diastolic (congestive) heart failure: Secondary | ICD-10-CM | POA: Diagnosis not present

## 2023-01-03 DIAGNOSIS — I5032 Chronic diastolic (congestive) heart failure: Secondary | ICD-10-CM | POA: Diagnosis not present

## 2023-01-04 ENCOUNTER — Other Ambulatory Visit: Payer: Self-pay | Admitting: Family Medicine

## 2023-01-04 DIAGNOSIS — I5032 Chronic diastolic (congestive) heart failure: Secondary | ICD-10-CM | POA: Diagnosis not present

## 2023-01-04 DIAGNOSIS — F411 Generalized anxiety disorder: Secondary | ICD-10-CM

## 2023-01-05 ENCOUNTER — Ambulatory Visit: Payer: Medicaid Other | Admitting: Family Medicine

## 2023-01-05 DIAGNOSIS — I5032 Chronic diastolic (congestive) heart failure: Secondary | ICD-10-CM | POA: Diagnosis not present

## 2023-01-08 ENCOUNTER — Ambulatory Visit: Payer: Medicaid Other | Admitting: Family Medicine

## 2023-01-08 DIAGNOSIS — I5032 Chronic diastolic (congestive) heart failure: Secondary | ICD-10-CM | POA: Diagnosis not present

## 2023-01-09 ENCOUNTER — Other Ambulatory Visit: Payer: 59 | Admitting: *Deleted

## 2023-01-09 ENCOUNTER — Encounter: Payer: Self-pay | Admitting: *Deleted

## 2023-01-09 ENCOUNTER — Ambulatory Visit: Payer: Medicaid Other | Admitting: Adult Health

## 2023-01-09 DIAGNOSIS — I5032 Chronic diastolic (congestive) heart failure: Secondary | ICD-10-CM | POA: Diagnosis not present

## 2023-01-09 NOTE — Patient Instructions (Signed)
Visit Information  Mary [name] was given information about Medicaid Managed Care team care coordination services as a part of their [facility] Medicaid benefit. [name] verbally consented to engagement with the [facility] Managed Care team.   If you are experiencing a medical emergency, please call 911 or report to your local emergency department or urgent care.   If you have a non-emergency medical problem during routine business hours, please contact your provider's office and ask to speak with a nurse.   For questions related to your [facility] health plan, please call: [phone] or go here:[email or web address]  If you would like to schedule transportation through your [facility] plan, please call the following number at least 2 days in advance of your appointment: [phone].   You can also use the MTM portal or MTM mobile app to manage your rides. Reimbursement for transportation is available through [facility]! For the portal, please go to [email or web address]  Call the [facility] Crisis Line at [phone], at any time, 24 hours a day, 7 days a week. If you are in danger or need immediate medical attention call 911.  If you would like help to quit smoking, call 1-800-QUIT-NOW ((939)579-4839) OR Espaol: 1-855-Djelo-Ya (1-027-253-6644) o para ms informacin haga clic aqu or Text READY to 034-742 to register via text  Mary [name],   Please see education materials related to allergic rhinitis provided by MyChart link.  Patient verbalizes understanding of instructions and care plan provided today and agrees to view in MyChart. Active MyChart status and patient understanding of how to access instructions and care plan via MyChart confirmed with patient.     Telephone follow up appointment with Managed Medicaid care management team member scheduled for:02/09/23 @ 10:30am  Mary Emms RN, BSN [place]  Managed [facility] RN Care  Coordinator [phone]   Following is a copy of your plan of care:  Care Plan : RN Care Manager Plan of Care  Updates made by Heidi Dach, RN since 01/09/2023 12:00 AM     Problem: Health Management needs related to Weight loss and COPD      Long-Range Goal: Development of Plan of Car to address Health Management needs related to Weight loss and COPD   Start Date: 08/28/2022  Expected End Date: 02/09/2023  Note:   Current Barriers:  Chronic Disease Management support and education needs related to COPD and Weight Loss Patient is grieving over the loss of a grandchild. She is working on her GED and is excited about this goal. Mary [name] is working on weight loss, currently taking Keto gummies.   RNCM Clinical Goal(s):  Patient will verbalize understanding of plan for management of COPD and weight loss as evidenced by patient reports take all medications exactly as prescribed and will call provider for medication related questions as evidenced by patient reports    attend all scheduled medical appointments: schedule follow up with Pulmonology and rescheduling GYN appointment as evidenced by provider documentation        work with social worker to address Financial constraints related to utilities, Limited access to food, and applying for disability related to the management of COPD and weight loss as evidenced by review of EMR and patient or social worker report     through collaboration with Medical illustrator, provider, and care team.   Interventions: Inter-disciplinary care team collaboration (see longitudinal plan of care) Evaluation of current treatment plan related to  self management and patient's adherence to plan as established by provider  Reviewed provider notes and discussed with patient  Provided therapeutic listening-patient recently lost a granddaughter Congratulated patient on working on GED, advised to contact [employer] for GED benefit   Hypertension Interventions:   (Status:  Goal on track:  Yes.) Long Term Goal - BP today 147/90 Last practice recorded BP readings:  BP Readings from Last 3 Encounters:  12/15/22 124/70  11/24/22 139/87  11/14/22 125/88  Most recent eGFR/CrCl:  Lab Results  Component Value Date   EGFR 72 11/30/2022    No components found for: "CRCL"  Evaluation of current treatment plan related to hypertension self management and patient's adherence to plan as established by provider Reviewed medications with patient and discussed importance of compliance Counseled on the importance of exercise goals with target of 150 minutes per week Advised patient, providing education and rationale, to monitor blood pressure daily and record, calling PCP for findings outside established parameters Reviewed scheduled/upcoming provider appointments including:  Advised patient to check BP daily, keep a journal of BP readings and take with her to her next PCP visit  COPD: (Status: Goal on Track (progressing): YES.) Long Term Goal  Reviewed medications with patient, including use of prescribed maintenance and rescue inhalers, and provided instruction on medication management and the importance of adherence Advised patient to track and manage COPD triggers Advised patient to self assesses COPD action plan zone and make appointment with provider if in the yellow zone for 48 hours without improvement Provided education about and advised patient to utilize infection prevention strategies to reduce risk of respiratory infection Discussed the importance of adequate rest and management of fatigue with COPD Assessed social determinant of health barriers Provided patient with Dr. Reginia Naas office number [phone] will call tomorrow to schedule an appointment  Weight Loss:  (Status: Goal on Track (progressing): YES.)  Offered to connect patient with psychology or social work support for counseling and supportive care;  Reviewed recommended dietary  changes: avoid fad diets, make small/incremental dietary and exercise changes, eat at the table and avoid eating in front of the TV, plan management of cravings, monitor snacking and cravings in food diary; Assessed social determinant of health barriers;  Provided encouragement and therapeutic listening Patient referred to Weight and Wellness, has not been scheduled  Patient Goals/Self-Care Activities: Take medications as prescribed   Attend all scheduled provider appointments Call provider office for new concerns or questions  Work with the social worker to address care coordination needs and will continue to work with the clinical team to address health care and disease management related needs

## 2023-01-09 NOTE — Patient Outreach (Signed)
Medicaid Managed Care   Nurse Care Manager Note  01/09/2023 Name:  Mary [name] MRN:  [id number] DOB:  [birth year]  Mary [name] is an 42 y.o. year old female who is [name] primary patient of Mary Sams, DO.  [facility] Managed Care Coordination team was consulted for assistance with:    HTN COPD Weight loss  [name] was given information about Medicaid Managed Care Coordination team services today. Mary [name] Patient agreed to services and verbal consent obtained.  Engaged with patient by telephone for follow up visit in response to provider referral for case management and/or care coordination services.   Assessments/Interventions:  Review of past medical history, allergies, medications, health status, including review of consultants reports, laboratory and other test data, was performed as part of comprehensive evaluation and provision of chronic care management services.  SDOH (Social Determinants of Health) assessments and interventions performed: SDOH Interventions    Flowsheet Row Telephone from 12/25/2022 in [place] POPULATION HEALTH DEPARTMENT Patient Outreach Telephone from 12/12/2022 in [place] POPULATION HEALTH DEPARTMENT Patient Outreach Telephone from 11/20/2022 in [place] POPULATION HEALTH DEPARTMENT Patient Outreach Telephone from 11/01/2022 in [place] POPULATION HEALTH DEPARTMENT Patient Outreach Telephone from 10/06/2022 in [place] POPULATION HEALTH DEPARTMENT Patient Outreach Telephone from 08/28/2022 in [place] POPULATION HEALTH DEPARTMENT  SDOH Interventions        Food Insecurity Interventions -- -- -- -- -- Other (Comment)  [BSW referral for local food pantries]  Housing Interventions -- -- -- -- -- Intervention Not Indicated  Transportation Interventions -- -- -- -- -- Intervention Not Indicated  Utilities Interventions -- -- -- -- -- Intervention Not Indicated  Depression Interventions/Treatment  -- -- -- -- Referral to  Psychiatry, Medication --  Stress Interventions Offered Mary [name], Provide Counseling Offered Mary [name] [name], Provide Counseling Offered Mary [name], Provide Counseling  [Anxiety due to recent ED visit] Offered Mary [name], Provide Counseling Offered Mary [name] [name], Provide Counseling --       Care Plan  Allergies  Allergen Reactions   Buprenorphine Hcl Anaphylaxis    "it stops my heart"   Morphine Anaphylaxis    i quit breathing    Morphine And Codeine Anaphylaxis    "it stops my heart"   Penicillins Anaphylaxis and Other (See Comments)    Has patient had [name] PCN reaction causing immediate rash, facial/tongue/throat swelling, SOB or lightheadedness with hypotension: Yes Has patient had [name] PCN reaction causing severe rash involving mucus membranes or skin necrosis: Yes Has patient had [name] PCN reaction that required hospitalization: Yes Has patient had [name] PCN reaction occurring within the last 10 years: No If all of the above answers are "NO", then may proceed with Cephalosporin use.    Gabapentin Other (See Comments)    Per patient "hallucination"   Nsaids    Sulfa Antibiotics     Other reaction(s): Unknown   Ibuprofen-Acetaminophen Nausea And Vomiting   Latex Rash   Tylenol [Acetaminophen] Nausea And Vomiting    Medications Reviewed Today     Reviewed by Mary Dach, RN (Registered Nurse) on 01/09/23 at 1550  Med List Status: <None>   Medication Order Taking? Sig Documenting Provider Last Dose Status Informant  albuterol (VENTOLIN HFA) 108 (90 Base) MCG/ACT inhaler [id number] Yes Inhale 2 puffs into the lungs every 6 (six) hours as needed for wheezing or shortness of breath. Mary Sams, DO Taking Active   Atogepant (QULIPTA) 10 MG TABS [id number] Yes  Take 10 mg by mouth daily. Mary Masters, [name] Taking Active   celecoxib (CELEBREX) 200 MG capsule [id number] Yes Take 1 capsule (200 mg total) by  mouth 2 (two) times daily. Mary Hearing, [name] Taking Active   Cholecalciferol (VITAMIN D3) 1000 units CAPS [id number] Yes Take 3,000 Units by mouth daily. Provider, Historical, [name] Taking Active   citalopram (CELEXA) 20 MG tablet [id number] Yes TAKE ONE TABLET ONCE DAILY Mary Sams, DO Taking Active   dapagliflozin propanediol (FARXIGA) 10 MG TABS tablet [id number] Yes Take 1 tablet (10 mg total) by mouth daily before breakfast. Mary Hora, [name] Taking Active   DULERA 100-5 MCG/ACT AERO [id number] Yes INHALE 2 PUFFS TWICE DAILY [name], Mary Albino, [name] Taking Active   eszopiclone (LUNESTA) 1 MG TABS tablet [id number] Yes Take 1 tablet (1 mg total) by mouth at bedtime as needed for sleep. Take immediately before bedtime Mary Sams, DO Taking Active   fluticasone [facility]) 50 MCG/ACT nasal spray [id number] Yes 1 SPRAY IN EACH NOSTRIL ONCE [name] DAY Mary Sams, DO Taking Active   hydrOXYzine (VISTARIL) 25 MG capsule [id number] Yes TAKE ONE CAPSULE UP TO EVERY 8 HOURS AS NEEDED Mary Sams, DO Taking Active   levocetirizine (XYZAL) 5 MG tablet [id number] Yes Take 1 tablet (5 mg total) by mouth every evening. Mary, Alvino Chapel, [name] Taking Active   losartan (COZAAR) 100 MG tablet [id number] Yes 100 mg one daily Mary, Mary [name], [name] Taking Active   melatonin 3 MG TABS tablet [id number] Yes Take 10 mg by mouth at bedtime. Provider, Historical, [name] Taking Active   omeprazole (PRILOSEC) 40 MG capsule [id number] Yes Take 40 mg by mouth daily. Provider, Historical, [name] Taking Active   ondansetron (ZOFRAN-ODT) 4 MG disintegrating tablet [id number] Yes TAKE 1 TABLET EVERY 8 HOURS AS NEEDED FOR NAUSEA OR VOMITING Mary Sams, DO Taking Active   pregabalin (LYRICA) 75 MG capsule [id number] Yes One tid Mary Sciara, [name] Taking Active   torsemide (DEMADEX) 20 MG tablet [id number] Yes TAKE ONE TABLET ONCE DAILY Mary Sams, DO Taking Active   UNABLE TO FIND [id number] Yes Med Name: Mary [name] Provider, Historical,  [name] Taking Active             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   COPD (chronic obstructive pulmonary disease) (HCC) 02/03/2014   PTSD (post-traumatic stress disorder) 02/03/2014    Conditions to be addressed/monitored per PCP order:  HTN, COPD, and Weight loss  Care Plan : RN Care Manager Plan of Care  Updates made by Mary Dach, RN since 01/09/2023 12:00 AM     Problem: Health Management needs related to Weight loss and COPD      Long-Range Goal: Development of Plan of Car to address Health Management needs related to Weight loss and COPD   Start Date: 08/28/2022  Expected End Date: 02/09/2023  Note:   Current Barriers:  Chronic Disease Management support and education needs related to COPD and Weight Loss Patient is grieving over the loss of [name] grandchild. She is  working on her GED and is excited about this goal. Ms. Quintero is working on weight loss, currently taking Keto gummies.   RNCM Clinical Goal(s):  Patient will verbalize understanding of plan for management of COPD and weight loss as evidenced by patient reports take all medications exactly as prescribed and will call provider for medication related questions as evidenced by patient reports    attend all scheduled medical appointments: schedule follow up with Pulmonology and rescheduling GYN appointment as evidenced by provider documentation        work with social worker to address Financial constraints related to utilities, Limited access to food, and applying for disability related to  the management of COPD and weight loss as evidenced by review of EMR and patient or Child psychotherapist report     through collaboration with Medical illustrator, provider, and care team.   Interventions: Inter-disciplinary care team collaboration (see longitudinal plan of care) Evaluation of current treatment plan related to  self management and patient's adherence to plan as established by provider Reviewed provider notes and discussed with patient  Provided therapeutic listening-patient recently lost [name] granddaughter Congratulated patient on working on [employer], advised to contact [employer] for GED benefit   Hypertension Interventions:  (Status:  Goal on track:  Yes.) Long Term Goal - BP today 147/90 Last practice recorded BP readings:  BP Readings from Last 3 Encounters:  12/15/22 124/70  11/24/22 139/87  11/14/22 125/88  Most recent eGFR/CrCl:  Lab Results  Component Value Date   EGFR 72 11/30/2022    No components found for: "CRCL"  Evaluation of current treatment plan related to hypertension self management and patient's adherence to plan as established by provider Reviewed medications with patient and discussed importance of compliance Counseled on the importance of exercise goals with target of 150 minutes per week Advised patient, providing education and rationale, to monitor blood pressure daily and record, calling PCP for findings outside established parameters Reviewed scheduled/upcoming provider appointments including:  Advised patient to check BP daily, keep [name] journal of BP readings and take with her to her next PCP visit  COPD: (Status: Goal on Track (progressing): YES.) Long Term Goal  Reviewed medications with patient, including use of prescribed maintenance and rescue inhalers, and provided instruction on medication management and the importance of adherence Advised patient to track and manage COPD triggers Advised patient to self assesses COPD action plan zone and make appointment  with provider if in the yellow zone for 48 hours without improvement Provided education about and advised patient to utilize infection prevention strategies to reduce risk of respiratory infection Discussed the importance of adequate rest and management of fatigue with COPD Assessed social determinant of health barriers Provided patient with Dr. Reginia Naas office number [phone] will call tomorrow to schedule an appointment  Weight Loss:  (Status: Goal on Track (progressing): YES.)  Offered to connect patient with psychology or social work support for counseling and supportive care;  Reviewed recommended dietary changes: avoid fad diets, make small/incremental dietary and exercise changes, eat at the table and avoid eating in front of the TV, plan management of cravings, monitor snacking and cravings in food diary; Assessed social determinant of health barriers;  Provided encouragement and therapeutic listening Patient referred to Weight and Wellness, has not been scheduled  Patient Goals/Self-Care Activities: Take medications as prescribed   Attend all scheduled provider appointments Call provider office for new concerns or questions  Work with the social worker to address care coordination needs and will  continue to work with the clinical team to address health care and disease management related needs       Follow Up:  Patient agrees to Care Plan and Follow-up.  Plan: The Managed Medicaid care management team will reach out to the patient again over the next 30 days.  Date/time of next scheduled RN care management/care coordination outreach:  02/09/23 @ 10:30am  [name] RN, BSN [place]  Managed [facility] RN Care Coordinator [phone]

## 2023-01-10 DIAGNOSIS — I5032 Chronic diastolic (congestive) heart failure: Secondary | ICD-10-CM | POA: Diagnosis not present

## 2023-01-11 DIAGNOSIS — I5032 Chronic diastolic (congestive) heart failure: Secondary | ICD-10-CM | POA: Diagnosis not present

## 2023-01-12 DIAGNOSIS — I5032 Chronic diastolic (congestive) heart failure: Secondary | ICD-10-CM | POA: Diagnosis not present

## 2023-01-15 DIAGNOSIS — I5032 Chronic diastolic (congestive) heart failure: Secondary | ICD-10-CM | POA: Diagnosis not present

## 2023-01-16 ENCOUNTER — Other Ambulatory Visit: Payer: 59 | Admitting: Licensed Clinical Social Worker

## 2023-01-16 DIAGNOSIS — I5032 Chronic diastolic (congestive) heart failure: Secondary | ICD-10-CM | POA: Diagnosis not present

## 2023-01-16 NOTE — Patient Instructions (Signed)
Visit Information  Mary [name] was given information about Medicaid Managed Care team care coordination services as a part of their [facility] Medicaid benefit. Mary [name] verbally consented to engagement with the [facility] Managed Care team.   If you are experiencing a medical emergency, please call 911 or report to your local emergency department or urgent care.   If you have a non-emergency medical problem during routine business hours, please contact your provider's office and ask to speak with a nurse.   For questions related to your [facility] health plan, please call: [phone] or go here:[email or web address]  If you would like to schedule transportation through your [facility] plan, please call the following number at least 2 days in advance of your appointment: [phone].   You can also use the MTM portal or MTM mobile app to manage your rides. Reimbursement for transportation is available through [facility]! For the portal, please go to [email or web address]  Call the [facility] Crisis Line at [phone], at any time, 24 hours a day, 7 days a week. If you are in danger or need immediate medical attention call 911.  If you would like help to quit smoking, call 1-800-QUIT-NOW ((669)024-9609) OR Espaol: 1-855-Djelo-Ya (4-132-440-1027) o para ms informacin haga clic aqu or Text READY to 253-664 to register via text  Following is a copy of your plan of care:  Care Plan : LCSW Plan of Care  Updates made by Mary Bryant, LCSW since 01/16/2023 12:00 AM     Problem: Anxiety Identification (Anxiety)      Long-Range Goal: Anxiety Symptoms Identified   Start Date: 10/06/2022  Note:   Priority: High  Timeframe:  Long-Range Goal Priority:  High Start Date:   10/06/22      Expected End Date:  ongoing                     Follow Up Date--01/30/23 at 1:15 pm  - keep 90 percent of scheduled appointments -consider counseling or  psychiatry -consider bumping up your self-care  -consider creating a stronger support network   Why is this important?             Combatting depression may take some time.            If you don't feel better right away, don't give up on your treatment plan.    Current barriers:   Chronic Mental Health needs related to addiction, PTSD, depression, stress and anxiety. Patient requires Support, Education, Resources, Referrals, Advocacy, and Care Coordination, in order to meet Unmet Mental Health Needs. Patient has a history of trauma and addiction Patient will implement clinical interventions discussed today to decrease symptoms of depression and increase knowledge and/or ability of: coping skills. Mental Health Concerns and Social Isolation Patient lacks knowledge of available community counseling agencies and resources. Ongoing issues with insomnia   Clinical Goal(s): verbalize understanding of plan for management of PTSD, Anxiety, Depression, and Stress and demonstrate a reduction in symptoms. Patient will connect with a provider for ongoing mental health treatment, increase coping skills, healthy habits, self-management skills, and stress reduction        Patient Goals/Self-Care Activities: Over the next 120 days Attend scheduled medical appointments Utilize healthy coping skills and supportive resources discussed Contact PCP with any questions or concerns Keep 90 percent of counseling appointments Call your insurance provider for more information about your Enhanced Benefits  Check out counseling resources provided  Begin personal counseling with LCSW, to reduce and  manage symptoms of Depression and Stress, until well-established with mental health provider Accept all calls from representative with [place] in an effort to establish ongoing mental health counseling and supportive services. Incorporate into daily practice - relaxation techniques, deep breathing exercises, and  mindfulness meditation strategies. Talk about feelings with friends, family members, spiritual advisor, etc. Contact LCSW directly [phone]), if you have questions, need assistance, or if additional social work needs are identified between now and our next scheduled telephone outreach call. Call 988 for mental health hotline/crisis line if needed (24/7 available) Try techniques to reduce symptoms of anxiety/negative thinking (deep breathing, distraction, positive self talk, etc)  - develop a personal safety plan - develop a plan to deal with triggers like holidays, anniversaries - exercise at least 2 to 3 times per week - have a plan for how to handle bad days - journal feelings and what helps to feel better or worse - spend time or talk with others at least 2 to 3 times per week - watch for early signs of feeling worse - begin personal counseling - call and visit an old friend - check out volunteer opportunities - join a support group - laugh; watch a funny movie or comedian - learn and use visualization or guided imagery - perform a random act of kindness - practice relaxation or meditation daily - start or continue a personal journal - practice positive thinking and self-talk -continue with compliance of taking medication  -identify current effective and ineffective coping strategies.  -implement positive self-talk in care to increase self-esteem, confidence and feelings of control.  -consider alternative and complementary therapy approaches such as meditation, mindfulness or yoga.  -journaling, prayer, worship services, meditation or pastoral counseling.  -increase participation in pleasurable group activities such as hobbies, singing, sports or volunteering).  -consider the use of meditative movement therapy such as tai chi, yoga or qigong.  -start a regular daily exercise program based on tolerance, ability and patient choice to support positive thinking and activity    If  you are experiencing a Mental Health or Behavioral Health Crisis or need someone to talk to, please call the Suicide and Crisis Lifeline: 988      24- Hour Availability:    [facility]  [address] [place], [place] [phone] Crisis [phone]   [facility] [phone]   [facility]  [phone]    [facility]  [phone] (after hours)   Therapeutic Alternative/Mobile Crisis   [phone]   [place] National Suicide Hotline  (831) 281-3550 (TALK) OR 988   Call 911 or go to emergency room   [facility]  [phone]);  [place] and [employer]  [phone]); [place], [place], [place], [place], [place], [place], [place]        10 LITTLE Things To Do When You're Feeling Too Down To Do Anything  Take a shower. Even if you plan to stay in all day long and not see a soul, take a shower. It takes the most effort to hop in to the shower but once you do, you'll feel immediate results. It will wake you up and you'll be feeling much fresher (and cleaner too).  Brush and floss your teeth. Give your teeth a good brushing with a floss finish. It's a small task but it feels so good and you can check 'taking care of your health' off the list of things to do.  Do  something small on your list. Most of [place] have some small thing we would like to get done (load of laundry, sew a button, email a friend). Doing one of these things will make you feel like you've accomplished something.  Drink water. Drinking water is easy right? It's also really beneficial for your health so keep a glass beside you all day and take sips often. It gives you energy and prevents you from boredom eating.  Do some floor exercises. The last thing you want to do is exercise but it might be just the thing you need the most. Keep it simple and do exercises that involve sitting or laying  on the floor. Even the smallest of exercises release chemicals in the brain that make you feel good. Yoga stretches or core exercises are going to make you feel good with minimal effort.  Make your bed. Making your bed takes a few minutes but it's productive and you'll feel relieved when it's done. An unmade bed is a huge visual reminder that you're having an unproductive day. Do it and consider it your housework for the day.  Put on some nice clothes. Take the sweatpants off even if you don't plan to go anywhere. Put on clothes that make you feel good. Take a look in the mirror so your brain recognizes the sweatpants have been replaced with clothes that make you look great. It's an instant confidence booster.  Wash the dishes. A pile of dirty dishes in the sink is a reflection of your mood. It's possible that if you wash up the dishes, your mood will follow suit. It's worth a try.  Cook a real meal. If you have the luxury to have a "do nothing" day, you have time to make a real meal for yourself. Make a meal that you love to eat. The process is good to get you out of the funk and the food will ensure you have more energy for tomorrow.  Write out your thoughts by hand. When you hand write, you stimulate your brain to focus on the moment that you're in so make yourself comfortable and write whatever comes into your mind. Put those thoughts out on paper so they stop spinning around in your head. Those thoughts might be the very thing holding you down.    Patient Goals: Follow up goal
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
## 2023-01-17 DIAGNOSIS — I5032 Chronic diastolic (congestive) heart failure: Secondary | ICD-10-CM | POA: Diagnosis not present

## 2023-01-18 DIAGNOSIS — I5032 Chronic diastolic (congestive) heart failure: Secondary | ICD-10-CM | POA: Diagnosis not present

## 2023-01-18 DIAGNOSIS — Z7951 Long term (current) use of inhaled steroids: Secondary | ICD-10-CM | POA: Diagnosis not present

## 2023-01-18 DIAGNOSIS — Z8249 Family history of ischemic heart disease and other diseases of the circulatory system: Secondary | ICD-10-CM | POA: Diagnosis not present

## 2023-01-18 DIAGNOSIS — G629 Polyneuropathy, unspecified: Secondary | ICD-10-CM | POA: Diagnosis not present

## 2023-01-18 DIAGNOSIS — K219 Gastro-esophageal reflux disease without esophagitis: Secondary | ICD-10-CM | POA: Diagnosis not present

## 2023-01-18 DIAGNOSIS — J309 Allergic rhinitis, unspecified: Secondary | ICD-10-CM | POA: Diagnosis not present

## 2023-01-18 DIAGNOSIS — Z885 Allergy status to narcotic agent status: Secondary | ICD-10-CM | POA: Diagnosis not present

## 2023-01-18 DIAGNOSIS — M1990 Unspecified osteoarthritis, unspecified site: Secondary | ICD-10-CM | POA: Diagnosis not present

## 2023-01-18 DIAGNOSIS — Z9181 History of falling: Secondary | ICD-10-CM | POA: Diagnosis not present

## 2023-01-18 DIAGNOSIS — I10 Essential (primary) hypertension: Secondary | ICD-10-CM | POA: Diagnosis not present

## 2023-01-18 DIAGNOSIS — R32 Unspecified urinary incontinence: Secondary | ICD-10-CM | POA: Diagnosis not present

## 2023-01-18 DIAGNOSIS — F419 Anxiety disorder, unspecified: Secondary | ICD-10-CM | POA: Diagnosis not present

## 2023-01-18 DIAGNOSIS — Z880 Allergy status to penicillin: Secondary | ICD-10-CM | POA: Diagnosis not present

## 2023-01-19 DIAGNOSIS — I5032 Chronic diastolic (congestive) heart failure: Secondary | ICD-10-CM | POA: Diagnosis not present

## 2023-01-20 DIAGNOSIS — Z419 Encounter for procedure for purposes other than remedying health state, unspecified: Secondary | ICD-10-CM | POA: Diagnosis not present

## 2023-01-22 DIAGNOSIS — L709 Acne, unspecified: Secondary | ICD-10-CM | POA: Diagnosis not present

## 2023-01-22 DIAGNOSIS — J019 Acute sinusitis, unspecified: Secondary | ICD-10-CM | POA: Diagnosis not present

## 2023-01-22 DIAGNOSIS — Z6841 Body Mass Index (BMI) 40.0 and over, adult: Secondary | ICD-10-CM | POA: Diagnosis not present

## 2023-01-22 DIAGNOSIS — I5032 Chronic diastolic (congestive) heart failure: Secondary | ICD-10-CM | POA: Diagnosis not present

## 2023-01-22 DIAGNOSIS — B354 Tinea corporis: Secondary | ICD-10-CM | POA: Diagnosis not present

## 2023-01-23 DIAGNOSIS — I5032 Chronic diastolic (congestive) heart failure: Secondary | ICD-10-CM | POA: Diagnosis not present

## 2023-01-24 ENCOUNTER — Other Ambulatory Visit: Payer: 59

## 2023-01-24 DIAGNOSIS — I5032 Chronic diastolic (congestive) heart failure: Secondary | ICD-10-CM | POA: Diagnosis not present

## 2023-01-24 NOTE — Patient Outreach (Signed)
Medicaid Managed Care Social Work Note  01/24/2023 Name:  Mary [name] MRN:  [id number] DOB:  01/05/1981  Mary [name] is an 42 y.o. year old female who is a primary patient of [name], DO.  The Medicaid Managed Care Coordination team was consulted for assistance with:  Community Resources   Ms. Helke was given information about Medicaid Managed Care Coordination team services today. [name] Patient agreed to services and verbal consent obtained.  Engaged with patient  for by telephone forfollow up visit in response to referral for case management and/or care coordination services.   Assessments/Interventions:  Review of past medical history, allergies, medications, health status, including review of consultants reports, laboratory and other test data, was performed as part of comprehensive evaluation and provision of chronic care management services.  SDOH: (Social Determinant of Health) assessments and interventions performed: SDOH Interventions    Flowsheet Row Patient Outreach Telephone from 01/16/2023 in [place] POPULATION [facility] DEPARTMENT Telephone from 12/25/2022 in [place] POPULATION HEALTH DEPARTMENT Patient Outreach Telephone from 12/12/2022 in [place] POPULATION HEALTH DEPARTMENT Patient Outreach Telephone from 11/20/2022 in [place] POPULATION HEALTH DEPARTMENT Patient Outreach Telephone from 11/01/2022 in [place] POPULATION HEALTH DEPARTMENT Patient Outreach Telephone from 10/06/2022 in [place] POPULATION HEALTH DEPARTMENT  SDOH Interventions        Housing Interventions Intervention Not Indicated -- -- -- -- --  Depression Interventions/Treatment  -- -- -- -- -- Referral to Psychiatry, Medication  Stress Interventions Offered [employer], Provide Counseling Offered [employer] Resources, Provide Counseling Offered [facility] Resources, Provide Counseling Offered [facility] Resources, Provide  Counseling  [Anxiety due to recent ED visit] Offered [employer], Provide Counseling Offered [employer] Resources, Provide Counseling     BSW completed a telephone outreach with patient she states she needs assistance with her rent and utilities. She does have a cut off notice for [employer] for July. BSW will mail patient resources for rent and utilities and contact [employer] to see if they can offer any assistance.   Advanced Directives Status:  Not addressed in this encounter.  Care Plan                 Allergies  Allergen Reactions   Buprenorphine Hcl Anaphylaxis    "it stops my heart"   Morphine Anaphylaxis    i quit breathing    Morphine And Codeine Anaphylaxis    "it stops my heart"   Penicillins Anaphylaxis and Other (See Comments)    Has patient had a PCN reaction causing immediate rash, facial/tongue/throat swelling, SOB or lightheadedness with hypotension: Yes Has patient had a PCN reaction causing severe rash involving mucus membranes or skin necrosis: Yes Has patient had a PCN reaction that required hospitalization: Yes Has patient had a PCN reaction occurring within the last 10 years: No If all of the above answers are "NO", then may proceed with Cephalosporin use.    Gabapentin Other (See Comments)    Per patient "hallucination"   Nsaids    Sulfa Antibiotics     Other reaction(s): Unknown   Ibuprofen-Acetaminophen Nausea And Vomiting   Latex Rash   Tylenol [Acetaminophen] Nausea And Vomiting    Medications Reviewed Today     Reviewed by Heidi Dach, RN (Registered Nurse) on 01/09/23 at 1550  Med List Status: <None>   Medication Order Taking? Sig Documenting Provider Last Dose Status Informant  albuterol (VENTOLIN HFA) 108 (90 Base) MCG/ACT inhaler [id number] Yes  Inhale 2 puffs into the lungs every 6 (six) hours as needed for wheezing or shortness of breath. [name], DO Taking Active   Atogepant (QULIPTA) 10 MG TABS [id number] Yes  Take 10 mg by mouth daily. Sonny Masters, FNP Taking Active   celecoxib (CELEBREX) 200 MG capsule [id number] Yes Take 1 capsule (200 mg total) by mouth 2 (two) times daily. Vickki Hearing, MD Taking Active   Cholecalciferol (VITAMIN D3) 1000 units CAPS [id number] Yes Take 3,000 Units by mouth daily. [provider] Taking Active   citalopram (CELEXA) 20 MG tablet [id number] Yes TAKE ONE TABLET ONCE DAILY [name], DO Taking Active   dapagliflozin propanediol (FARXIGA) 10 MG TABS tablet [id number] Yes Take 1 tablet (10 mg total) by mouth daily before breakfast. Janetta Hora, PA-C Taking Active   DULERA 100-5 MCG/ACT AERO [id number] Yes INHALE 2 PUFFS TWICE DAILY Rakes, Doralee Albino, FNP Taking Active   eszopiclone (LUNESTA) 1 MG TABS tablet [id number] Yes Take 1 tablet (1 mg total) by mouth at bedtime as needed for sleep. Take immediately before bedtime [name], DO Taking Active   fluticasone [facility]) 50 MCG/ACT nasal spray [id number] Yes 1 SPRAY IN EACH NOSTRIL ONCE A DAY [name], DO Taking Active   hydrOXYzine (VISTARIL) 25 MG capsule [id number] Yes TAKE ONE CAPSULE UP TO EVERY 8 HOURS AS NEEDED [name], DO Taking Active   levocetirizine (XYZAL) 5 MG tablet [id number] Yes Take 1 tablet (5 mg total) by mouth every evening. [employer], Alvino Chapel, FNP Taking Active   losartan (COZAAR) 100 MG tablet [id number] Yes 100 mg one daily Luking, Scott A, MD Taking Active   melatonin 3 MG TABS tablet [id number] Yes Take 10 mg by mouth at bedtime. [provider] Taking Active   omeprazole (PRILOSEC) 40 MG capsule [id number] Yes Take 40 mg by mouth daily. [provider] Taking Active   ondansetron (ZOFRAN-ODT) 4 MG disintegrating tablet [id number] Yes TAKE 1 TABLET EVERY 8 HOURS AS NEEDED FOR NAUSEA OR VOMITING [name], DO Taking Active   pregabalin (LYRICA) 75 MG capsule [id number] Yes One tid Babs Sciara, MD Taking Active   torsemide (DEMADEX) 20 MG tablet  [id number] Yes TAKE ONE TABLET ONCE DAILY [name], DO Taking Active   UNABLE TO FIND [id number] Yes Med Name: Timothy Lasso [provider] Taking Active             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   Follow up:  Patient agrees to Care Plan and Follow-up.  Plan: The Managed Medicaid care management team will reach out to the patient again over the next 15 days.  Date/time of next scheduled Social Work care management/care coordination outreach:  02/14/23 Gus Puma, Kenard Gower, [facility]  Managed Medicaid  Social Worker [phone]

## 2023-01-24 NOTE — Patient Outreach (Signed)
BSW heard from [facility] rep and they can assist with rent and utilities up to 250. Patient has to contact 2 community resources first.   Gus Puma, [place], [facility]  Managed [facility] Social Worker [phone]

## 2023-01-24 NOTE — Patient Instructions (Signed)
Visit Information  Ms. Busk was given information about Medicaid Managed Care team care coordination services as a part of their [facility] Medicaid benefit. [name] verbally consented to engagement with the [facility] Managed Care team.   If you are experiencing a medical emergency, please call 911 or report to your local emergency department or urgent care.   If you have a non-emergency medical problem during routine business hours, please contact your provider's office and ask to speak with a nurse.   For questions related to your [facility] health plan, please call: [phone] or go here:[email or web address]  If you would like to schedule transportation through your [facility] plan, please call the following number at least 2 days in advance of your appointment: [phone].   You can also use the MTM portal or MTM mobile app to manage your rides. Reimbursement for transportation is available through [facility]! For the portal, please go to [email or web address]  Call the [facility] Crisis Line at [phone], at any time, 24 hours a day, 7 days a week. If you are in danger or need immediate medical attention call 911.  If you would like help to quit smoking, call 1-800-QUIT-NOW ((480)260-8510) OR Espaol: 1-855-Djelo-Ya (4-132-440-1027) o para ms informacin haga clic aqu or Text READY to 253-664 to register via text  Ms. Pischke - following are the goals we discussed in your visit today:   Goals Addressed   None      Social Worker will follow up in 15 days.   Gus Puma, Kenard Gower, MHA [facility]  Managed Medicaid Social Worker [phone]   Following is a copy of your plan of care:  There are no care plans that you recently modified to display for this patient.

## 2023-01-25 DIAGNOSIS — I5032 Chronic diastolic (congestive) heart failure: Secondary | ICD-10-CM | POA: Diagnosis not present

## 2023-01-26 DIAGNOSIS — I5032 Chronic diastolic (congestive) heart failure: Secondary | ICD-10-CM | POA: Diagnosis not present

## 2023-01-29 DIAGNOSIS — I5032 Chronic diastolic (congestive) heart failure: Secondary | ICD-10-CM | POA: Diagnosis not present

## 2023-01-30 ENCOUNTER — Other Ambulatory Visit: Payer: 59 | Admitting: Licensed Clinical Social Worker

## 2023-01-30 DIAGNOSIS — I5032 Chronic diastolic (congestive) heart failure: Secondary | ICD-10-CM | POA: Diagnosis not present
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
## 2023-01-30 NOTE — Patient Instructions (Signed)
Visit Information  Mary [name] was given information about Medicaid Managed Care team care coordination services as a part of their [facility] Medicaid benefit. Mary [name] verbally consented to engagement with the [facility] Managed Care team.   If you are experiencing a medical emergency, please call 911 or report to your local emergency department or urgent care.   If you have a non-emergency medical problem during routine business hours, please contact your provider's office and ask to speak with a nurse.   For questions related to your [facility] health plan, please call: [phone] or go here:[email or web address]  If you would like to schedule transportation through your [facility] plan, please call the following number at least 2 days in advance of your appointment: [phone].   You can also use the MTM portal or MTM mobile app to manage your rides. Reimbursement for transportation is available through [facility]! For the portal, please go to [email or web address]  Call the [facility] Crisis Line at [phone], at any time, 24 hours a day, 7 days a week. If you are in danger or need immediate medical attention call 911.  If you would like help to quit smoking, call 1-800-QUIT-NOW ((825)674-9025) OR Espaol: 1-855-Djelo-Ya (4-132-440-1027) o para ms informacin haga clic aqu or Text READY to 253-664 to register via text   Following is a copy of your plan of care:  Care Plan : LCSW Plan of Care  Updates made by Mary Bryant, LCSW since 01/30/2023 12:00 AM     Problem: Anxiety Identification (Anxiety)      Long-Range Goal: Anxiety Symptoms Identified   Start Date: 10/06/2022  Note:   Priority: High  Timeframe:  Long-Range Goal Priority:  High Start Date:   10/06/22      Expected End Date:  ongoing                     Follow Up Date--02/20/23 at 1 pm  - keep 90 percent of scheduled appointments -consider counseling or psychiatry -consider  bumping up your self-care  -consider creating a stronger support network   Why is this important?             Combatting depression may take some time.            If you don't feel better right away, don't give up on your treatment plan.    Current barriers:   Chronic Mental Health needs related to addiction, PTSD, depression, stress and anxiety. Patient requires Support, Education, Resources, Referrals, Advocacy, and Care Coordination, in order to meet Unmet Mental Health Needs. Patient has a history of trauma and addiction Patient will implement clinical interventions discussed today to decrease symptoms of depression and increase knowledge and/or ability of: coping skills. Mental Health Concerns and Social Isolation Patient lacks knowledge of available community counseling agencies and resources. Ongoing issues with insomnia   Clinical Goal(s): verbalize understanding of plan for management of PTSD, Anxiety, Depression, and Stress and demonstrate a reduction in symptoms. Patient will connect with a provider for ongoing mental health treatment, increase coping skills, healthy habits, self-management skills, and stress reduction       Patient Goals/Self-Care Activities: Over the next 120 days Attend scheduled medical appointments Utilize healthy coping skills and supportive resources discussed Contact PCP with any questions or concerns Keep 90 percent of counseling appointments Call your insurance provider for more information about your Enhanced Benefits  Check out counseling resources provided  Begin personal counseling with LCSW, to reduce and  manage symptoms of Depression and Stress, until well-established with mental health provider Accept all calls from representative with [place] in an effort to establish ongoing mental health counseling and supportive services. Incorporate into daily practice - relaxation techniques, deep breathing exercises, and mindfulness meditation  strategies. Talk about feelings with friends, family members, spiritual advisor, etc. Contact LCSW directly [phone]), if you have questions, need assistance, or if additional social work needs are identified between now and our next scheduled telephone outreach call. Call 988 for mental health hotline/crisis line if needed (24/7 available) Try techniques to reduce symptoms of anxiety/negative thinking (deep breathing, distraction, positive self talk, etc)  - develop a personal safety plan - develop a plan to deal with triggers like holidays, anniversaries - exercise at least 2 to 3 times per week - have a plan for how to handle bad days - journal feelings and what helps to feel better or worse - spend time or talk with others at least 2 to 3 times per week - watch for early signs of feeling worse - begin personal counseling - call and visit an old friend - check out volunteer opportunities - join a support group - laugh; watch a funny movie or comedian - learn and use visualization or guided imagery - perform a random act of kindness - practice relaxation or meditation daily - start or continue a personal journal - practice positive thinking and self-talk -continue with compliance of taking medication  -identify current effective and ineffective coping strategies.  -implement positive self-talk in care to increase self-esteem, confidence and feelings of control.  -consider alternative and complementary therapy approaches such as meditation, mindfulness or yoga.  -journaling, prayer, worship services, meditation or pastoral counseling.  -increase participation in pleasurable group activities such as hobbies, singing, sports or volunteering).  -consider the use of meditative movement therapy such as tai chi, yoga or qigong.  -start a regular daily exercise program based on tolerance, ability and patient choice to support positive thinking and activity    If you are experiencing a  Mental Health or Behavioral Health Crisis or need someone to talk to, please call the Suicide and Crisis Lifeline: 988      24- Hour Availability:    [facility]  [address] [place], [place] [phone] Crisis [phone]   [facility] [phone]   [place] [facility]  [phone]    [facility]  [phone] (after hours)   Therapeutic Alternative/Mobile Crisis   [phone]   [place] National Suicide Hotline  (862)246-5063 (TALK) OR 988   Call 911 or go to emergency room   [facility]  [phone]);  [place] and [employer]  [phone]); [place], [place], [place], [place], [place], [place], [place]        10 LITTLE Things To Do When You're Feeling Too Down To Do Anything  Take a shower. Even if you plan to stay in all day long and not see a soul, take a shower. It takes the most effort to hop in to the shower but once you do, you'll feel immediate results. It will wake you up and you'll be feeling much fresher (and cleaner too).  Brush and floss your teeth. Give your teeth a good brushing with a floss finish. It's a small task but it feels so good and you can check 'taking care of your health' off the list of things to do.  Do  something small on your list. Most of [place] have some small thing we would like to get done (load of laundry, sew a button, email a friend). Doing one of these things will make you feel like you've accomplished something.  Drink water. Drinking water is easy right? It's also really beneficial for your health so keep a glass beside you all day and take sips often. It gives you energy and prevents you from boredom eating.  Do some floor exercises. The last thing you want to do is exercise but it might be just the thing you need the most. Keep it simple and do exercises that involve sitting or laying on the floor. Even the  smallest of exercises release chemicals in the brain that make you feel good. Yoga stretches or core exercises are going to make you feel good with minimal effort.  Make your bed. Making your bed takes a few minutes but it's productive and you'll feel relieved when it's done. An unmade bed is a huge visual reminder that you're having an unproductive day. Do it and consider it your housework for the day.  Put on some nice clothes. Take the sweatpants off even if you don't plan to go anywhere. Put on clothes that make you feel good. Take a look in the mirror so your brain recognizes the sweatpants have been replaced with clothes that make you look great. It's an instant confidence booster.  Wash the dishes. A pile of dirty dishes in the sink is a reflection of your mood. It's possible that if you wash up the dishes, your mood will follow suit. It's worth a try.  Cook a real meal. If you have the luxury to have a "do nothing" day, you have time to make a real meal for yourself. Make a meal that you love to eat. The process is good to get you out of the funk and the food will ensure you have more energy for tomorrow.  Write out your thoughts by hand. When you hand write, you stimulate your brain to focus on the moment that you're in so make yourself comfortable and write whatever comes into your mind. Put those thoughts out on paper so they stop spinning around in your head. Those thoughts might be the very thing holding you down.    Patient Goals: Follow up goal

## 2023-01-31 DIAGNOSIS — I5032 Chronic diastolic (congestive) heart failure: Secondary | ICD-10-CM | POA: Diagnosis not present

## 2023-02-01 DIAGNOSIS — I5032 Chronic diastolic (congestive) heart failure: Secondary | ICD-10-CM | POA: Diagnosis not present

## 2023-02-05 DIAGNOSIS — I5032 Chronic diastolic (congestive) heart failure: Secondary | ICD-10-CM | POA: Diagnosis not present

## 2023-02-06 DIAGNOSIS — I5032 Chronic diastolic (congestive) heart failure: Secondary | ICD-10-CM | POA: Diagnosis not present

## 2023-02-09 ENCOUNTER — Other Ambulatory Visit: Payer: 59 | Admitting: *Deleted

## 2023-02-09 NOTE — Patient Outreach (Signed)
  Medicaid Managed Care   Unsuccessful Attempt Note   02/09/2023 Name: Mary [name] MRN: [id number] DOB: Aug 02, 1981  Referred by: [name], DO Reason for referral : High Risk Managed Medicaid (Unsuccessful [facility] outreach)   An unsuccessful telephone outreach was attempted today. The patient was referred to the case management team for assistance with care management and care coordination.    Follow Up Plan: A HIPAA compliant phone message was left for the patient providing contact information and requesting a return call. and The Managed Medicaid care management team will reach out to the patient again over the next 7 days.    [name] RN, BSN [place]  Managed [facility] RN Care Coordinator [phone]

## 2023-02-09 NOTE — Patient Instructions (Signed)
Visit Information  Ms. [name]  - as a part of your Medicaid benefit, you are eligible for care management and care coordination services at no cost or copay. I was unable to reach you by phone today but would be happy to help you with your health related needs. Please feel free to call me @ [phone].   A member of the Managed Medicaid care management team will reach out to you again over the next 7 days.   [name] RN, BSN [place]  Managed [facility] RN Care Coordinator [phone]

## 2023-02-13 ENCOUNTER — Other Ambulatory Visit: Payer: 59

## 2023-02-13 NOTE — Patient Instructions (Signed)
Visit Information  Mary [name] was given information about Medicaid Managed Care team care coordination services as a part of their [facility] Medicaid benefit. Mary [name] verbally consented to engagement with the [facility] Managed Care team.   If you are experiencing a medical emergency, please call 911 or report to your local emergency department or urgent care.   If you have a non-emergency medical problem during routine business hours, please contact your provider's office and ask to speak with a nurse.   For questions related to your [facility] health plan, please call: [phone] or go here:[email or web address]  If you would like to schedule transportation through your [facility] plan, please call the following number at least 2 days in advance of your appointment: [phone].   You can also use the MTM portal or MTM mobile app to manage your rides. Reimbursement for transportation is available through [facility]! For the portal, please go to [email or web address]  Call the [facility] Crisis Line at [phone], at any time, 24 hours a day, 7 days a week. If you are in danger or need immediate medical attention call 911.  If you would like help to quit smoking, call 1-800-QUIT-NOW (858-393-2830) OR Espaol: 1-855-Djelo-Ya (3-557-322-0254) o para ms informacin haga clic aqu or Text READY to 270-623 to register via text  Ms. Fanelli - following are the goals we discussed in your visit today:   Goals Addressed   None      Social Worker will follow up in 30 days.   Gus Puma, Kenard Gower, MHA [facility] Health  Managed Medicaid Social Worker [phone]   Following is a copy of your plan of care:  There are no care plans that you recently modified to display for this patient.

## 2023-02-13 NOTE — Patient Outreach (Signed)
Medicaid Managed Care Social Work Note  02/13/2023 Name:  Mary [name] MRN:  [id number] DOB:  January 25, 1981  Mary [name] is an 42 y.o. year old female who is a primary patient of Mary Sams, DO.  The Medicaid Managed Care Coordination team was consulted for assistance with:  Community Resources   Mary [name] was given information about Medicaid Managed Care Coordination team services today. Mary [name] Patient agreed to services and verbal consent obtained.  Engaged with patient  for by telephone forfollow up visit in response to referral for case management and/or care coordination services.   Assessments/Interventions:  Review of past medical history, allergies, medications, health status, including review of consultants reports, laboratory and other test data, was performed as part of comprehensive evaluation and provision of chronic care management services.  SDOH: (Social Determinant of Health) assessments and interventions performed: SDOH Interventions    Flowsheet Row Patient Outreach Telephone from 01/30/2023 in [place] POPULATION HEALTH DEPARTMENT Patient Outreach Telephone from 01/16/2023 in [place] POPULATION HEALTH DEPARTMENT Telephone from 12/25/2022 in [place] POPULATION HEALTH DEPARTMENT Patient Outreach Telephone from 12/12/2022 in [place] POPULATION HEALTH DEPARTMENT Patient Outreach Telephone from 11/20/2022 in [place] POPULATION HEALTH DEPARTMENT Patient Outreach Telephone from 11/01/2022 in  POPULATION HEALTH DEPARTMENT  SDOH Interventions        Housing Interventions -- Intervention Not Indicated -- -- -- --  Stress Interventions Offered [employer], Provide Counseling Offered [employer] Resources, Provide Counseling Offered Community Wellness Resources, Provide Counseling Offered Community Wellness Resources, Provide Counseling Offered Community Wellness Resources, Provide Counseling  [Anxiety due to recent  ED visit] Offered [employer], Provide Counseling      BSW completed a telephone outreach with patient and informed about Well[employer] benefit. Patient states she has contacted 2 community resources and would contact [employer]. No additional resources are needed at this time.  Advanced Directives Status:  Not addressed in this encounter.  Care Plan                 Allergies  Allergen Reactions   Buprenorphine Hcl Anaphylaxis    "it stops my heart"   Morphine Anaphylaxis    i quit breathing    Morphine And Codeine Anaphylaxis    "it stops my heart"   Penicillins Anaphylaxis and Other (See Comments)    Has patient had a PCN reaction causing immediate rash, facial/tongue/throat swelling, SOB or lightheadedness with hypotension: Yes Has patient had a PCN reaction causing severe rash involving mucus membranes or skin necrosis: Yes Has patient had a PCN reaction that required hospitalization: Yes Has patient had a PCN reaction occurring within the last 10 years: No If all of the above answers are "NO", then may proceed with Cephalosporin use.    Gabapentin Other (See Comments)    Per patient "hallucination"   Nsaids    Sulfa Antibiotics     Other reaction(s): Unknown   Ibuprofen-Acetaminophen Nausea And Vomiting   Latex Rash   Tylenol [Acetaminophen] Nausea And Vomiting    Medications Reviewed Today     Reviewed by Heidi Dach, RN (Registered Nurse) on 01/09/23 at 1550  Med List Status: <None>   Medication Order Taking? Sig Documenting Provider Last Dose Status Informant  albuterol (VENTOLIN HFA) 108 (90 Base) MCG/ACT inhaler [id number] Yes Inhale 2 puffs into the lungs every 6 (six) hours as needed for wheezing or shortness of breath. Mary Sams, DO Taking Active   Atogepant (QULIPTA) 10 MG  TABS [id number] Yes Take 10 mg by mouth daily. Sonny Masters, FNP Taking Active   celecoxib (CELEBREX) 200 MG capsule [id number] Yes Take 1 capsule (200 mg total) by mouth 2  (two) times daily. Vickki Hearing, MD Taking Active   Cholecalciferol (VITAMIN D3) 1000 units CAPS [id number] Yes Take 3,000 Units by mouth daily. [provider] Taking Active   citalopram (CELEXA) 20 MG tablet [id number] Yes TAKE ONE TABLET ONCE DAILY Mary Sams, DO Taking Active   dapagliflozin propanediol (FARXIGA) 10 MG TABS tablet [id number] Yes Take 1 tablet (10 mg total) by mouth daily before breakfast. Janetta Hora, PA-C Taking Active   DULERA 100-5 MCG/ACT AERO [id number] Yes INHALE 2 PUFFS TWICE DAILY Rakes, Doralee Albino, FNP Taking Active   eszopiclone (LUNESTA) 1 MG TABS tablet [id number] Yes Take 1 tablet (1 mg total) by mouth at bedtime as needed for sleep. Take immediately before bedtime Mary Sams, DO Taking Active   fluticasone [facility]) 50 MCG/ACT nasal spray [id number] Yes 1 SPRAY IN EACH NOSTRIL ONCE A DAY Mary Sams, DO Taking Active   hydrOXYzine (VISTARIL) 25 MG capsule [id number] Yes TAKE ONE CAPSULE UP TO EVERY 8 HOURS AS NEEDED Mary Sams, DO Taking Active   levocetirizine (XYZAL) 5 MG tablet [id number] Yes Take 1 tablet (5 mg total) by mouth every evening. Amed[employer]ite, Alvino Chapel, FNP Taking Active   losartan (COZAAR) 100 MG tablet [id number] Yes 100 mg one daily Luking, Scott A, MD Taking Active   melatonin 3 MG TABS tablet [id number] Yes Take 10 mg by mouth at bedtime. [provider] Taking Active   omeprazole (PRILOSEC) 40 MG capsule [id number] Yes Take 40 mg by mouth daily. [provider] Taking Active   ondansetron (ZOFRAN-ODT) 4 MG disintegrating tablet [id number] Yes TAKE 1 TABLET EVERY 8 HOURS AS NEEDED FOR NAUSEA OR VOMITING Mary Sams, DO Taking Active   pregabalin (LYRICA) 75 MG capsule [id number] Yes One tid Babs Sciara, MD Taking Active   torsemide (DEMADEX) 20 MG tablet [id number] Yes TAKE ONE TABLET ONCE DAILY Mary Sams, DO Taking Active   UNABLE TO FIND [id number] Yes Med Name: Mary [name] [provider]  Taking Active             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   Follow up:  Patient agrees to Care Plan and Follow-up.  Plan: The Managed Medicaid care management team will reach out to the patient again over the next 30 days.  Date/time of next scheduled Social Work care management/care coordination outreach:  03/15/23  Gus Puma, Kenard Gower, [name]  Managed [facility] Social Worker [phone]

## 2023-02-15 ENCOUNTER — Telehealth: Payer: Self-pay

## 2023-02-15 NOTE — Telephone Encounter (Signed)
..   Medicaid Managed Care   Unsuccessful Outreach Note  02/15/2023 Name: Mary [name] MRN: [id number] DOB: 1981-03-11  Referred by: [name], DO Reason for referral : Appointment   A second unsuccessful telephone outreach was attempted today. The patient was referred to the case management team for assistance with care management and care coordination.   Follow Up Plan: A HIPAA compliant phone message was left for the patient providing contact information and requesting a return call.  The care management team will reach out to the patient again over the next 7 days.   Weston Settle Care Guide  [facility] Managed  Care Guide [facility]  [phone]

## 2023-02-19 DIAGNOSIS — Z419 Encounter for procedure for purposes other than remedying health state, unspecified: Secondary | ICD-10-CM | POA: Diagnosis not present

## 2023-02-26 ENCOUNTER — Telehealth: Payer: Self-pay

## 2023-02-26 NOTE — Telephone Encounter (Signed)
..   Medicaid Managed Care   Unsuccessful Outreach Note  02/26/2023 Name: Mary [name] MRN: [id number] DOB: 09/27/1980  Referred by: [name], DO Reason for referral : Appointment   Third unsuccessful telephone outreach was attempted today. The patient was referred to the case management team for assistance with care management and care coordination. The patient's primary care provider has been notified of our unsuccessful attempts to make or maintain contact with the patient. The care management team is pleased to engage with this patient at any time in the future should he/she be interested in assistance from the care management team.   Follow Up Plan: We have been unable to make contact with the patient for follow up. The care management team is available to follow up with the patient after provider conversation with the patient regarding recommendation for care management engagement and subsequent re-referral to the care management team.   Weston Settle Care Guide  [facility] Managed  Care Guide [facility]  [phone]

## 2023-02-28 ENCOUNTER — Encounter (INDEPENDENT_AMBULATORY_CARE_PROVIDER_SITE_OTHER): Payer: 59 | Admitting: Family Medicine

## 2023-03-06 ENCOUNTER — Other Ambulatory Visit (HOSPITAL_COMMUNITY)
Admission: RE | Admit: 2023-03-06 | Discharge: 2023-03-06 | Disposition: A | Discharge status: Home / Self Care | Payer: 59 | Source: Ambulatory Visit | Attending: Adult Health | Admitting: Adult Health

## 2023-03-06 ENCOUNTER — Ambulatory Visit (INDEPENDENT_AMBULATORY_CARE_PROVIDER_SITE_OTHER): Payer: 59 | Admitting: Adult Health

## 2023-03-06 ENCOUNTER — Encounter: Payer: Self-pay | Admitting: Adult Health

## 2023-03-06 VITALS — BP 161/104 | HR 70 | Ht 69.0 in | Wt 360.2 lb

## 2023-03-06 DIAGNOSIS — I10 Essential (primary) hypertension: Secondary | ICD-10-CM

## 2023-03-06 DIAGNOSIS — Z1211 Encounter for screening for malignant neoplasm of colon: Secondary | ICD-10-CM

## 2023-03-06 DIAGNOSIS — Z8541 Personal history of malignant neoplasm of cervix uteri: Secondary | ICD-10-CM | POA: Insufficient documentation

## 2023-03-06 DIAGNOSIS — Z08 Encounter for follow-up examination after completed treatment for malignant neoplasm: Secondary | ICD-10-CM | POA: Diagnosis present

## 2023-03-06 DIAGNOSIS — R159 Full incontinence of feces: Secondary | ICD-10-CM

## 2023-03-06 DIAGNOSIS — Z90710 Acquired absence of both cervix and uterus: Secondary | ICD-10-CM

## 2023-03-06 DIAGNOSIS — R198 Other specified symptoms and signs involving the digestive system and abdomen: Secondary | ICD-10-CM

## 2023-03-06 DIAGNOSIS — Z1330 Encounter for screening examination for mental health and behavioral disorders, unspecified: Secondary | ICD-10-CM | POA: Diagnosis not present

## 2023-03-06 DIAGNOSIS — N3946 Mixed incontinence: Secondary | ICD-10-CM

## 2023-03-06 DIAGNOSIS — Z01419 Encounter for gynecological examination (general) (routine) without abnormal findings: Secondary | ICD-10-CM | POA: Diagnosis not present

## 2023-03-06 DIAGNOSIS — Z1231 Encounter for screening mammogram for malignant neoplasm of breast: Secondary | ICD-10-CM | POA: Diagnosis not present

## 2023-03-06 DIAGNOSIS — B372 Candidiasis of skin and nail: Secondary | ICD-10-CM

## 2023-03-06 LAB — HEMOCCULT GUIAC POC 1CARD (OFFICE): Fecal Occult Blood, POC: NEGATIVE

## 2023-03-06 MED ORDER — KETOCONAZOLE 2 % EX CREA: 1.0000 | TOPICAL_CREAM | Freq: Every day | CUTANEOUS | 0 refills | Status: DC

## 2023-03-06 NOTE — Addendum Note (Signed)
Addended by: Cyril Mourning A on: 03/06/2023 03:21 PM   Modules accepted: Orders

## 2023-03-06 NOTE — Progress Notes (Addendum)
Patient ID: Mary [name], female   DOB: 05/23/1981, 42 y.o.   MRN: [id number] History of Present Illness: Mary [name] is a 42 year old white female,single, sp hysterectomy(had cervical cancer) in for a well woman gyn exam and pap.  She is complaining of weight, has urinary incontinence and fecal incontinence and itchy spots in head and arms, has meds, and +ringworm. Has headache today, has not taken BP meds.   PCP is Dr Adriana Simas Current Medications, Allergies, Past Medical History, Past Surgical History, Family History and Social History were reviewed in [employer] electronic medical record.     Review of Systems: Patient denies any  hearing loss, fatigue, blurred vision, shortness of breath, chest pain, abdominal pain, problems with  intercourse(not active). No joint pain or mood swings.  See HPI for positives.  Physical Exam:BP (!) 161/104 (BP Location: Left Arm, Patient Position: Sitting, Cuff Size: Normal)   Pulse 70   Ht 5\' 9"  (1.753 m)   Wt (!) 360 lb 3.2 oz (163.4 kg)   BMI 53.19 kg/m   General:  Well developed, well nourished, no acute distress Skin:  Warm and dry,has scabs on arms and in her head, has cream and shampoo Neck:  Midline trachea, normal thyroid, good ROM, no lymphadenopathy Lungs; Clear to auscultation bilaterally Breast:  No dominant palpable mass, retraction, or nipple discharge Cardiovascular: Regular rate and rhythm Abdomen:  Soft, non tender, no hepatosplenomegaly Pelvic:  External genitalia is normal in appearance, no lesions. Has what appears to be ringworm right inner thigh.  The vagina is normal in appearance. Urethra has no lesions or masses. The cervix and uterus are absent, pap was performed with HR HPV genotyping. No adnexal masses or tenderness noted.Bladder is non tender, no masses felt. Rectal: Good sphincter tone, no polyps, or hemorrhoids felt.  Hemoccult negative. Extremities/musculoskeletal:  No swelling or varicosities noted, no clubbing or  cyanosis Psych:  No mood changes, alert and cooperative,seems happy AA is 0 Fall risk is moderate    03/06/2023    2:12 PM 12/15/2022   11:23 AM 11/24/2022   10:28 AM  Depression screen PHQ 2/9  Decreased Interest 0 0 2  Down, Depressed, Hopeless 2 0 2  PHQ - 2 Score 2 0 4  Altered sleeping 3 2 3   Tired, decreased energy 2 2 2   Change in appetite 0 0 0  Feeling bad or failure about yourself  2 0 1  Trouble concentrating 0 3 3  Moving slowly or fidgety/restless 0 0 0  Suicidal thoughts 0 0 0  PHQ-9 Score 9 7 13   Difficult doing work/chores  Very difficult Very difficult   She is on meds    03/06/2023    2:13 PM 12/15/2022   11:23 AM 11/24/2022   10:28 AM 07/07/2022    2:11 PM  GAD 7 : Generalized Anxiety Score  Nervous, Anxious, on Edge 2 1 3 1   Control/stop worrying 2 1 2 1   Worry too much - different things 2 2 3 1   Trouble relaxing 2 3 2 1   Restless 2 3 1 1   Easily annoyed or irritable 2 0 2 2  Afraid - awful might happen 0 0 0 0  Total GAD 7 Score 12 10 13 7   Anxiety Difficulty  Very difficult Very difficult Somewhat difficult      Upstream - 03/06/23 1419       Pregnancy Intention Screening   Does the patient want to become pregnant in the next year? N/A  Does the patient's partner want to become pregnant in the next year? N/A    Would the patient like to discuss contraceptive options today? N/A      Contraception Wrap Up   Current Method Female Sterilization   hyst   End Method Female Sterilization   hyst   Contraception Counseling Provided No             Examination chaperoned by Malachy Mood LPN  Impression and Plan: 1. Encounter for well woman exam with routine gynecological exam Physical in 1 year Labs with PCP  2. S/P hysterectomy  3. History of cervical cancer Pap sent  - Cytology - PAP( [facility])  4. Primary hypertension Take BP meds  Follow up with PCP  5. Morbid obesity (HCC) Talk with PCP  Would not recommend phentermine with BP    6. Skin yeast infection Will rx ketoconazole  Meds ordered this encounter  Medications   ketoconazole (NIZORAL) 2 % cream    Sig: Apply 1 Application topically daily.    Dispense:  15 g    Refill:  0    Order Specific Question:   Supervising Provider    Answer:   EURE, LUTHER H [[id number]]     7. Mixed stress and urge urinary incontinence Refer to urology to be evaluated  - Ambulatory referral to Urology  8. Full incontinence of feces Has on and off not all the time Referred to [facility] to be evaluated  - Ambulatory referral to Gastroenterology  9. Alternating constipation and diarrhea Referred to [facility]  - Ambulatory referral to Gastroenterology  10. Encounter for screening fecal occult blood testing Hemoccult was negative   11. Vaginal Pap smear following hysterectomy for malignancy Pap sent  - Cytology - PAP( [place])  12. Screening mammogram for breast cancer Mammogram scheduled for 03/14/23 at 11:30 am at [facility]  - MM 3D SCREENING MAMMOGRAM BILATERAL BREAST; Future

## 2023-03-09 LAB — CYTOLOGY - PAP
Chlamydia: NEGATIVE
Comment: NEGATIVE
Comment: NEGATIVE
Comment: NORMAL
Diagnosis: NEGATIVE
High risk HPV: NEGATIVE
Neisseria Gonorrhea: NEGATIVE

## 2023-03-13 ENCOUNTER — Ambulatory Visit (INDEPENDENT_AMBULATORY_CARE_PROVIDER_SITE_OTHER): Payer: 59 | Admitting: Psychiatry

## 2023-03-13 ENCOUNTER — Encounter (HOSPITAL_COMMUNITY): Payer: Self-pay | Admitting: Psychiatry

## 2023-03-13 DIAGNOSIS — F4310 Post-traumatic stress disorder, unspecified: Secondary | ICD-10-CM | POA: Diagnosis not present
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
## 2023-03-14 ENCOUNTER — Ambulatory Visit (HOSPITAL_COMMUNITY)
Admission: RE | Admit: 2023-03-14 | Discharge: 2023-03-14 | Disposition: A | Discharge status: Home / Self Care | Payer: 59 | Source: Ambulatory Visit | Attending: Adult Health | Admitting: Adult Health

## 2023-03-14 DIAGNOSIS — Z1231 Encounter for screening mammogram for malignant neoplasm of breast: Secondary | ICD-10-CM | POA: Insufficient documentation

## 2023-03-15 ENCOUNTER — Other Ambulatory Visit: Payer: 59

## 2023-03-15 NOTE — Patient Instructions (Signed)
  Medicaid Managed Care   Unsuccessful Outreach Note  03/15/2023 Name: JOHNELL LANDOWSKI MRN: [id number] DOB: 02-15-1981  Referred by: [name], DO Reason for referral : High Risk Managed Medicaid ([facility] Unsuccessful Telephone outreach)   An unsuccessful telephone outreach was attempted today. The patient was referred to the case management team for assistance with care management and care coordination.   Follow Up Plan: A HIPAA compliant phone message was left for the patient providing contact information and requesting a return call.   Abelino Derrick, MHA [facility]  Managed [facility] Social Worker [phone]

## 2023-03-15 NOTE — Patient Outreach (Signed)
  Medicaid Managed Care   Unsuccessful Outreach Note  03/15/2023 Name: Mary [name] MRN: [id number] DOB: 02-15-1981  Referred by: [name], DO Reason for referral : High Risk Managed Medicaid ([facility] Unsuccessful Telephone outreach)   An unsuccessful telephone outreach was attempted today. The patient was referred to the case management team for assistance with care management and care coordination.   Follow Up Plan: A HIPAA compliant phone message was left for the patient providing contact information and requesting a return call.   Abelino Derrick, MHA [facility]  [facility] Social Worker [phone]
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
## 2023-03-19 ENCOUNTER — Encounter: Payer: Self-pay | Admitting: Gastroenterology

## 2023-03-19 ENCOUNTER — Other Ambulatory Visit: Payer: Self-pay | Admitting: *Deleted

## 2023-03-19 ENCOUNTER — Encounter: Payer: Self-pay | Admitting: *Deleted

## 2023-03-19 ENCOUNTER — Ambulatory Visit (INDEPENDENT_AMBULATORY_CARE_PROVIDER_SITE_OTHER): Payer: 59 | Admitting: Gastroenterology

## 2023-03-19 VITALS — BP 154/104 | Temp 97.7°F | Ht 69.0 in | Wt 364.0 lb

## 2023-03-19 DIAGNOSIS — Z800 Family history of malignant neoplasm of digestive organs: Secondary | ICD-10-CM

## 2023-03-19 DIAGNOSIS — Z83719 Family history of colon polyps, unspecified: Secondary | ICD-10-CM | POA: Diagnosis not present

## 2023-03-19 DIAGNOSIS — K219 Gastro-esophageal reflux disease without esophagitis: Secondary | ICD-10-CM

## 2023-03-19 DIAGNOSIS — K625 Hemorrhage of anus and rectum: Secondary | ICD-10-CM

## 2023-03-19 MED ORDER — PEG 3350-KCL-NA BICARB-NACL 420 G PO SOLR: 4000.0000 mL | Freq: Once | ORAL | 0 refills | Status: AC

## 2023-03-19 NOTE — Patient Instructions (Addendum)
We are scheduling you for a colonoscopy in the near future with Dr. Levon Hedger.   Start taking Linzess 145 mg daily. I have provided smaples for you today. Ensure you take this on an empty stomach.   How to take Linzess: Once a day every day on empty stomach, at least 30 minutes before your first meal of the day. It is best to keep medications at a stable temperature Medication is best kept in its original bottle with the disket present.  It is a medication that is meant for everyday use and not to be used as needed.   What to expect: Constipation relief is typically felt in about 1 week Relief of abdominal pain, discomfort, and bloating begins in about 1 week with symptoms typically improving over 12 weeks and beyond. Diarrhea is most common side effect and typically begins within the first 2 weeks and can take 3-4 weeks to resolve It would be helpful to begin treatment over the weekend or when you can be closer to a bathroom   You can go to [email or web address] for patient support and sign up for daily medication reminders.   Continue your omeprazole 40 mg once daily, 30 minutes prior to breakfast.  If you begin to have worsening issues with swallowing please let me know and we can get a special x-ray to evaluate but only if it becomes more frequently.  Follow up in 2 months.   It was a pleasure to see you today. I want to create trusting relationships with patients. If you receive a survey regarding your visit,  I greatly appreciate you taking time to fill this out on paper or through your MyChart. I value your feedback.  Brooke Bonito, MSN, FNP-BC, AGACNP-BC [facility]

## 2023-03-20 ENCOUNTER — Encounter: Payer: Self-pay | Admitting: *Deleted

## 2023-03-20 ENCOUNTER — Encounter: Payer: Self-pay | Admitting: Gastroenterology

## 2023-03-22 DIAGNOSIS — Z419 Encounter for procedure for purposes other than remedying health state, unspecified: Secondary | ICD-10-CM | POA: Diagnosis not present

## 2023-03-29 ENCOUNTER — Other Ambulatory Visit: Payer: Self-pay | Admitting: Family Medicine

## 2023-03-29 DIAGNOSIS — M25472 Effusion, left ankle: Secondary | ICD-10-CM

## 2023-04-04 ENCOUNTER — Ambulatory Visit: Payer: 59 | Admitting: Urology

## 2023-04-05 NOTE — Patient Instructions (Signed)
Mary [name]  04/05/2023     @PREFPERIOPPHARMACY @   Your procedure is scheduled on 04/10/23.  Report to [facility] at 1230 P.M.  Call this number if you have problems the morning of surgery:  [phone]  If you experience any cold or flu symptoms such as cough, fever, chills, shortness of breath, etc. between now and your scheduled surgery, please notify us at the above number.   Remember:  Do not eat or drink after midnight.    Take these medicines the morning of surgery with A SIP OF WATER qulipta, celebrex, celexa, prilosec, lyrica & zofran if needed.   Use your inhalers before coming to hospital & bring them with you.  Do not take any diabetic medication morning of procedure.  FARXIGA LAST DOSE TO BE 04/07/23.    Do not wear jewelry, make-up or nail polish, including gel polish,  artificial nails, or any other type of covering on natural nails (fingers and  toes).  Do not wear lotions, powders, or perfumes, or deodorant.  Do not shave 48 hours prior to surgery.  Men may shave face and neck.  Do not bring valuables to the hospital.  [facility] is not responsible for any belongings or valuables.  Contacts, dentures or bridgework may not be worn into surgery.  Leave your suitcase in the car.  After surgery it may be brought to your room.  For patients admitted to the hospital, discharge time will be determined by your treatment team.  Patients discharged the day of surgery will not be allowed to drive home.   Name and phone number of your driver:   FAMILY  Special instructions:  FOLLOE DIET & PREP INSTRUCTIONS PROVIDED TO YOU FROM OFFICE.  Please read over the following fact sheets that you were given.       PATIENT INSTRUCTIONS POST-ANESTHESIA  IMMEDIATELY FOLLOWING SURGERY:  Do not drive or operate machinery for the first twenty four hours after surgery.  Do not make any important decisions for twenty four hours after surgery or while taking narcotic pain  medications or sedatives.  If you develop intractable nausea and vomiting or a severe headache please notify your doctor immediately.  FOLLOW-UP:  Please make an appointment with your surgeon as instructed. You do not need to follow up with anesthesia unless specifically instructed to do so.  WOUND CARE INSTRUCTIONS (if applicable):  Keep a dry clean dressing on the anesthesia/puncture wound site if there is drainage.  Once the wound has quit draining you may leave it open to air.  Generally you should leave the bandage intact for twenty four hours unless there is drainage.  If the epidural site drains for more than 36-48 hours please call the anesthesia department.  QUESTIONS?:  Please feel free to call your physician or the hospital operator if you have any questions, and they will be happy to assist you.      Colonoscopy, Adult A colonoscopy is a procedure to look at the entire large intestine. This procedure is done using a long, thin, flexible tube that has a camera on the end. You may have a colonoscopy: As a part of normal colorectal screening. If you have certain symptoms, such as: A low number of red blood cells in your blood (anemia). Diarrhea that does not go away. Pain in your abdomen. Blood in your stool. A colonoscopy can help screen for and diagnose medical problems, including: An abnormal growth of cells or tissue (tumor). Abnormal growths within  the lining of your intestine (polyps). Inflammation. Areas of bleeding. Tell your health care provider about: Any allergies you have. All medicines you are taking, including vitamins, herbs, eye drops, creams, and over-the-counter medicines. Any problems you or family members have had with anesthetic medicines. Any bleeding problems you have. Any surgeries you have had. Any medical conditions you have. Any problems you have had with having bowel movements. Whether you are pregnant or may be pregnant. What are the  risks? Generally, this is a safe procedure. However, problems may occur, including: Bleeding. Damage to your intestine. Allergic reactions to medicines given during the procedure. Infection. This is rare. What happens before the procedure? Eating and drinking restrictions Follow instructions from your health care provider about eating or drinking restrictions, which may include: A few days before the procedure: Follow a low-fiber diet. Avoid nuts, seeds, dried fruit, raw fruits, and vegetables. 1-3 days before the procedure: Eat only gelatin dessert or ice pops. Drink only clear liquids, such as water, clear juice, clear broth or bouillon, black coffee or tea, or clear soft drinks or sports drinks. Avoid liquids that contain red or purple dye. The day of the procedure: Do not eat solid foods. You may continue to drink clear liquids until up to 2 hours before the procedure. Do not eat or drink anything starting 2 hours before the procedure, or within the time period that your health care provider recommends. Bowel prep If you were prescribed a bowel prep to take by mouth (orally) to clean out your colon: Take it as told by your health care provider. Starting the day before your procedure, you will need to drink a large amount of liquid medicine. The liquid will cause you to have many bowel movements of loose stool until your stool becomes almost clear or light green. If your skin or the opening between the buttocks (anus) gets irritated from diarrhea, you may relieve the irritation using: Wipes with medicine in them, such as adult wet wipes with aloe and vitamin E. A product to soothe skin, such as petroleum jelly. If you vomit while drinking the bowel prep: Take a break for up to 60 minutes. Begin the bowel prep again. Call your health care provider if you keep vomiting or you cannot take the bowel prep without vomiting. To clean out your colon, you may also be given: Laxative  medicines. These help you have a bowel movement. Instructions for enema use. An enema is liquid medicine injected into your rectum. Medicines Ask your health care provider about: Changing or stopping your regular medicines or supplements. This is especially important if you are taking iron supplements, diabetes medicines, or blood thinners. Taking medicines such as aspirin and ibuprofen. These medicines can thin your blood. Do not take these medicines unless your health care provider tells you to take them. Taking over-the-counter medicines, vitamins, herbs, and supplements. General instructions Ask your health care provider what steps will be taken to help prevent infection. These may include washing skin with a germ-killing soap. If you will be going home right after the procedure, plan to have a responsible adult: Take you home from the hospital or clinic. You will not be allowed to drive. Care for you for the time you are told. What happens during the procedure?  An IV will be inserted into one of your veins. You will be given a medicine to make you fall asleep (general anesthetic). You will lie on your side with your knees bent. A lubricant will  be put on the tube. Then the tube will be: Inserted into your anus. Gently eased through all parts of your large intestine. Air will be sent into your colon to keep it open. This may cause some pressure or cramping. Images will be taken with the camera and will appear on a screen. A small tissue sample may be removed to be looked at under a microscope (biopsy). The tissue may be sent to a lab for testing if any signs of problems are found. If small polyps are found, they may be removed and checked for cancer cells. When the procedure is finished, the tube will be removed. The procedure may vary among health care providers and hospitals. What happens after the procedure? Your blood pressure, heart rate, breathing rate, and blood oxygen level  will be monitored until you leave the hospital or clinic. You may have a small amount of blood in your stool. You may pass gas and have mild cramping or bloating in your abdomen. This is caused by the air that was used to open your colon during the exam. If you were given a sedative during the procedure, it can affect you for several hours. Do not drive or operate machinery until your health care provider says that it is safe. It is up to you to get the results of your procedure. Ask your health care provider, or the department that is doing the procedure, when your results will be ready. Summary A colonoscopy is a procedure to look at the entire large intestine. Follow instructions from your health care provider about eating and drinking before the procedure. If you were prescribed an oral bowel prep to clean out your colon, take it as told by your health care provider. During the colonoscopy, a flexible tube with a camera on its end is inserted into the anus and then passed into all parts of the large intestine. This information is not intended to replace advice given to you by your health care provider. Make sure you discuss any questions you have with your health care provider. Document Revised: 09/19/2022 Document Reviewed: 03/30/2021 Elsevier Patient Education  2024 [employer]. Colonoscopy, Adult, Care After The following information offers guidance on how to care for yourself after your procedure. Your health care provider may also give you more specific instructions. If you have problems or questions, contact your health care provider. What can I expect after the procedure? After the procedure, it is common to have: A small amount of blood in your stool for 24 hours after the procedure. Some gas. Mild cramping or bloating of your abdomen. Follow these instructions at home: Eating and drinking  Drink enough fluid to keep your urine pale yellow. Follow instructions from your health  care provider about eating or drinking restrictions. Resume your normal diet as told by your health care provider. Avoid heavy or fried foods that are hard to digest. Activity Rest as told by your health care provider. Avoid sitting for a long time without moving. Get up to take short walks every 1-2 hours. This is important to improve blood flow and breathing. Ask for help if you feel weak or unsteady. Return to your normal activities as told by your health care provider. Ask your health care provider what activities are safe for you. Managing cramping and bloating  Try walking around when you have cramps or feel bloated. If directed, apply heat to your abdomen as told by your health care provider. Use the heat source that your  health care provider recommends, such as a moist heat pack or a heating pad. Place a towel between your skin and the heat source. Leave the heat on for 20-30 minutes. Remove the heat if your skin turns bright red. This is especially important if you are unable to feel pain, heat, or cold. You have a greater risk of getting burned. General instructions If you were given a sedative during the procedure, it can affect you for several hours. Do not drive or operate machinery until your health care provider says that it is safe. For the first 24 hours after the procedure: Do not sign important documents. Do not drink alcohol. Do your regular daily activities at a slower pace than normal. Eat soft foods that are easy to digest. Take over-the-counter and prescription medicines only as told by your health care provider. Keep all follow-up visits. This is important. Contact a health care provider if: You have blood in your stool 2-3 days after the procedure. Get help right away if: You have more than a small spotting of blood in your stool. You have large blood clots in your stool. You have swelling of your abdomen. You have nausea or vomiting. You have a fever. You have  increasing pain in your abdomen that is not relieved with medicine. These symptoms may be an emergency. Get help right away. Call 911. Do not wait to see if the symptoms will go away. Do not drive yourself to the hospital. Summary After the procedure, it is common to have a small amount of blood in your stool. You may also have mild cramping and bloating of your abdomen. If you were given a sedative during the procedure, it can affect you for several hours. Do not drive or operate machinery until your health care provider says that it is safe. Get help right away if you have a lot of blood in your stool, nausea or vomiting, a fever, or increased pain in your abdomen. This information is not intended to replace advice given to you by your health care provider. Make sure you discuss any questions you have with your health care provider. Document Revised: 09/19/2022 Document Reviewed: 03/30/2021 Elsevier Patient Education  2024 [employer].

## 2023-04-06 ENCOUNTER — Encounter (HOSPITAL_COMMUNITY): Payer: Self-pay

## 2023-04-06 ENCOUNTER — Encounter (HOSPITAL_COMMUNITY)
Admission: RE | Admit: 2023-04-06 | Discharge: 2023-04-06 | Disposition: A | Discharge status: Home / Self Care | Payer: 59 | Source: Ambulatory Visit | Attending: Gastroenterology | Admitting: Gastroenterology

## 2023-04-06 VITALS — BP 157/101 | HR 57 | Temp 97.7°F | Resp 18 | Ht 69.0 in | Wt 364.0 lb

## 2023-04-06 DIAGNOSIS — Z01818 Encounter for other preprocedural examination: Secondary | ICD-10-CM | POA: Diagnosis not present

## 2023-04-06 DIAGNOSIS — Z01812 Encounter for preprocedural laboratory examination: Secondary | ICD-10-CM | POA: Insufficient documentation

## 2023-04-06 LAB — BASIC METABOLIC PANEL
Anion gap: 15 (ref 5–15)
BUN: 10 mg/dL (ref 6–20)
CO2: 29 mmol/L (ref 22–32)
Calcium: 8.8 mg/dL — ABNORMAL LOW (ref 8.9–10.3)
Chloride: 96 mmol/L — ABNORMAL LOW (ref 98–111)
Creatinine, Ser: 1.04 mg/dL — ABNORMAL HIGH (ref 0.44–1.00)
GFR, Estimated: >60 mL/min (ref >60)
Glucose, Bld: 102 mg/dL — ABNORMAL HIGH (ref 70–99)
Potassium: 4.1 mmol/L (ref 3.5–5.1)
Sodium: 140 mmol/L (ref 135–145)

## 2023-04-09 ENCOUNTER — Other Ambulatory Visit: Payer: 59 | Admitting: *Deleted

## 2023-04-09 NOTE — Patient Outreach (Signed)
Care Management/Care Coordination  RN Case Manager Case Closure Note  04/09/2023 Name: Mary [name] MRN: [id number] DOB: 10-28-1980  Mary [name] is a 42 y.o. year old female who is a primary care patient of [name], DO. The care management/care coordination team was consulted for assistance with chronic disease management and/or care coordination needs.   Care Plan : RN Care Manager Plan of Care  Updates made by Heidi Dach, RN since 04/09/2023 12:00 AM  Completed 04/09/2023   Problem: Health Management needs related to Weight loss and COPD Resolved 04/09/2023     Long-Range Goal: Development of Plan of Car to address Health Management needs related to Weight loss and COPD Completed 04/09/2023  Start Date: 08/28/2022  Expected End Date: 02/09/2023  Note:   Current Barriers:  Chronic Disease Management support and education needs related to COPD and Weight Loss Unable to maintain contact with patient  RNCM Clinical Goal(s):  Patient will verbalize understanding of plan for management of COPD and weight loss as evidenced by patient reports take all medications exactly as prescribed and will call provider for medication related questions as evidenced by patient reports    attend all scheduled medical appointments: schedule follow up with Pulmonology and rescheduling GYN appointment as evidenced by provider documentation        work with Child psychotherapist to address Financial constraints related to utilities, Limited access to food, and applying for disability related to the management of COPD and weight loss as evidenced by review of EMR and patient or Child psychotherapist report     through collaboration with Medical illustrator, provider, and care team.   Interventions: Inter-disciplinary care team collaboration (see longitudinal plan of care) Evaluation of current treatment plan related to  self management and patient's adherence to plan as established by provider Reviewed provider  notes and discussed with patient  Provided therapeutic listening-patient recently lost a granddaughter Congratulated patient on working on [employer], advised to contact [employer] for GED benefit   Hypertension Interventions:  (Status:  Goal on track:  Yes. and Gaol Not Met.) Long Term Goal - BP today 147/90 Last practice recorded BP readings:  BP Readings from Last 3 Encounters:  12/15/22 124/70  11/24/22 139/87  11/14/22 125/88   Most recent eGFR/CrCl:  Lab Results  Component Value Date   EGFR 72 11/30/2022    No components found for: "CRCL"  Evaluation of current treatment plan related to hypertension self management and patient's adherence to plan as established by provider Reviewed medications with patient and discussed importance of compliance Counseled on the importance of exercise goals with target of 150 minutes per week Advised patient, providing education and rationale, to monitor blood pressure daily and record, calling PCP for findings outside established parameters Reviewed scheduled/upcoming provider appointments including:  Advised patient to check BP daily, keep a journal of BP readings and take with her to her next PCP visit  COPD: (Status: Goal Not Met.) Long Term Goal  Reviewed medications with patient, including use of prescribed maintenance and rescue inhalers, and provided instruction on medication management and the importance of adherence Advised patient to track and manage COPD triggers Advised patient to self assesses COPD action plan zone and make appointment with provider if in the yellow zone for 48 hours without improvement Provided education about and advised patient to utilize infection prevention strategies to reduce risk of respiratory infection Discussed the importance of adequate rest and management of fatigue with COPD Assessed social determinant  of health barriers Provided patient with Dr. Reginia Naas office number [phone] will call tomorrow to  schedule an appointment  Weight Loss:  (Status: Goal on Track (progressing): YES. Goal Not Met.)  Offered to connect patient with psychology or social work support for counseling and supportive care;  Reviewed recommended dietary changes: avoid fad diets, make small/incremental dietary and exercise changes, eat at the table and avoid eating in front of the TV, plan management of cravings, monitor snacking and cravings in food diary; Assessed social determinant of health barriers;  Provided encouragement and therapeutic listening Patient referred to Weight and Wellness, has not been scheduled  Patient Goals/Self-Care Activities: Take medications as prescribed   Attend all scheduled provider appointments Call provider office for new concerns or questions  Work with the social worker to address care coordination needs and will continue to work with the clinical team to address health care and disease management related needs       Plan: We have been unable to make contact with the patient for follow up. The care management team is available to follow up with the patient should new care management/care coordination needs arise.   [name] RN, BSN [place]  Managed [facility] RN Care Coordinator [phone]

## 2023-04-10 ENCOUNTER — Ambulatory Visit (HOSPITAL_COMMUNITY): Payer: 59 | Admitting: Anesthesiology

## 2023-04-10 ENCOUNTER — Encounter (HOSPITAL_COMMUNITY): Payer: Self-pay | Admitting: Gastroenterology

## 2023-04-10 ENCOUNTER — Ambulatory Visit (HOSPITAL_BASED_OUTPATIENT_CLINIC_OR_DEPARTMENT_OTHER): Payer: 59 | Admitting: Anesthesiology

## 2023-04-10 ENCOUNTER — Encounter (HOSPITAL_COMMUNITY)
Admission: RE | Disposition: A | Discharge status: Home / Self Care | Payer: Self-pay | Source: Home / Self Care | Attending: Gastroenterology

## 2023-04-10 ENCOUNTER — Ambulatory Visit (HOSPITAL_COMMUNITY)
Admission: RE | Admit: 2023-04-10 | Discharge: 2023-04-10 | Disposition: A | Discharge status: Home / Self Care | Payer: 59 | Attending: Gastroenterology | Admitting: Gastroenterology

## 2023-04-10 DIAGNOSIS — K625 Hemorrhage of anus and rectum: Secondary | ICD-10-CM | POA: Diagnosis present

## 2023-04-10 DIAGNOSIS — I110 Hypertensive heart disease with heart failure: Secondary | ICD-10-CM

## 2023-04-10 DIAGNOSIS — Z83719 Family history of colon polyps, unspecified: Secondary | ICD-10-CM | POA: Insufficient documentation

## 2023-04-10 DIAGNOSIS — K648 Other hemorrhoids: Secondary | ICD-10-CM | POA: Diagnosis not present

## 2023-04-10 DIAGNOSIS — D126 Benign neoplasm of colon, unspecified: Secondary | ICD-10-CM | POA: Diagnosis not present

## 2023-04-10 DIAGNOSIS — Z8541 Personal history of malignant neoplasm of cervix uteri: Secondary | ICD-10-CM | POA: Diagnosis not present

## 2023-04-10 DIAGNOSIS — I5032 Chronic diastolic (congestive) heart failure: Secondary | ICD-10-CM

## 2023-04-10 DIAGNOSIS — I10 Essential (primary) hypertension: Secondary | ICD-10-CM | POA: Diagnosis not present

## 2023-04-10 DIAGNOSIS — F419 Anxiety disorder, unspecified: Secondary | ICD-10-CM | POA: Diagnosis not present

## 2023-04-10 DIAGNOSIS — G4730 Sleep apnea, unspecified: Secondary | ICD-10-CM | POA: Insufficient documentation

## 2023-04-10 DIAGNOSIS — K5900 Constipation, unspecified: Secondary | ICD-10-CM | POA: Insufficient documentation

## 2023-04-10 DIAGNOSIS — K635 Polyp of colon: Secondary | ICD-10-CM | POA: Diagnosis not present

## 2023-04-10 DIAGNOSIS — Z79899 Other long term (current) drug therapy: Secondary | ICD-10-CM | POA: Diagnosis not present

## 2023-04-10 DIAGNOSIS — M069 Rheumatoid arthritis, unspecified: Secondary | ICD-10-CM | POA: Diagnosis not present

## 2023-04-10 DIAGNOSIS — Z7985 Long-term (current) use of injectable non-insulin antidiabetic drugs: Secondary | ICD-10-CM | POA: Insufficient documentation

## 2023-04-10 DIAGNOSIS — D128 Benign neoplasm of rectum: Secondary | ICD-10-CM | POA: Insufficient documentation

## 2023-04-10 DIAGNOSIS — F319 Bipolar disorder, unspecified: Secondary | ICD-10-CM | POA: Diagnosis not present

## 2023-04-10 DIAGNOSIS — J449 Chronic obstructive pulmonary disease, unspecified: Secondary | ICD-10-CM | POA: Diagnosis not present

## 2023-04-10 HISTORY — PX: COLONOSCOPY WITH PROPOFOL: SHX5780

## 2023-04-10 HISTORY — PX: POLYPECTOMY: SHX5525

## 2023-04-10 LAB — HM COLONOSCOPY

## 2023-04-10 SURGERY — COLONOSCOPY WITH PROPOFOL
Anesthesia: General

## 2023-04-10 MED ORDER — LACTATED RINGERS IV SOLN: INTRAVENOUS | Status: DC

## 2023-04-10 MED ORDER — LIDOCAINE HCL 1 % IJ SOLN
INTRAMUSCULAR | Status: DC | PRN
  Administered 2023-04-10: 50 mg via INTRADERMAL

## 2023-04-10 MED ORDER — PROPOFOL 10 MG/ML IV BOLUS
INTRAVENOUS | Status: DC | PRN
Start: 2023-04-10 — End: 2023-04-10
  Administered 2023-04-10: 100 mg via INTRAVENOUS

## 2023-04-10 MED ORDER — PROPOFOL 500 MG/50ML IV EMUL
INTRAVENOUS | Status: DC | PRN
  Administered 2023-04-10: 100 ug/kg/min via INTRAVENOUS

## 2023-04-10 NOTE — Discharge Instructions (Signed)
You are being discharged to home.  Resume your previous diet.  We are waiting for your pathology results.  Your physician has recommended a repeat colonoscopy in three years because the bowel preparation was suboptimal.  Take Linzess 145 mcg every day.

## 2023-04-10 NOTE — Anesthesia Postprocedure Evaluation (Signed)
Anesthesia Post Note  Patient: Mary [name]  Procedure(s) Performed: COLONOSCOPY WITH PROPOFOL POLYPECTOMY  Patient location during evaluation: Short Stay Anesthesia Type: General Level of consciousness: awake and alert Pain management: pain level controlled Vital Signs Assessment: post-procedure vital signs reviewed and stable Respiratory status: spontaneous breathing Cardiovascular status: blood pressure returned to baseline and stable Postop Assessment: no apparent nausea or vomiting Anesthetic complications: no   No notable events documented.   Last Vitals:  Vitals:   04/10/23 0915 04/10/23 1045  BP: (!) 149/94 (!) 146/75  Pulse:  62  Resp:  (!) 21  Temp:  36.5 C  SpO2:  99%    Last Pain:  Vitals:   04/10/23 1045  TempSrc: Axillary  PainSc: 0-No pain                 Pau Banh

## 2023-04-10 NOTE — Anesthesia Preprocedure Evaluation (Signed)
Anesthesia Evaluation  Patient identified by MRN, date of birth, ID band Patient awake    Reviewed: Allergy & Precautions, H&P , NPO status , Patient's Chart, lab work & pertinent test results, reviewed documented beta blocker date and time   Airway Mallampati: II  TM Distance: >3 FB Neck ROM: full    Dental no notable dental hx.    Pulmonary neg pulmonary ROS, asthma , sleep apnea , COPD   Pulmonary exam normal breath sounds clear to auscultation       Cardiovascular Exercise Tolerance: Good hypertension, +CHF  negative cardio ROS  Rhythm:regular Rate:Normal     Neuro/Psych  Headaches PSYCHIATRIC DISORDERS Anxiety Depression Bipolar Disorder    Neuromuscular disease negative neurological ROS  negative psych ROS   GI/Hepatic negative GI ROS, Neg liver ROS,,,  Endo/Other    Morbid obesity  Renal/GU negative Renal ROS  negative genitourinary   Musculoskeletal   Abdominal   Peds  Hematology negative hematology ROS (+)   Anesthesia Other Findings   Reproductive/Obstetrics negative OB ROS                             Anesthesia Physical Anesthesia Plan  ASA: 3  Anesthesia Plan: General   Post-op Pain Management:    Induction:   PONV Risk Score and Plan: Propofol infusion  Airway Management Planned:   Additional Equipment:   Intra-op Plan:   Post-operative Plan:   Informed Consent: I have reviewed the patients History and Physical, chart, labs and discussed the procedure including the risks, benefits and alternatives for the proposed anesthesia with the patient or authorized representative who has indicated his/her understanding and acceptance.     Dental Advisory Given  Plan Discussed with: CRNA  Anesthesia Plan Comments:        Anesthesia Quick Evaluation

## 2023-04-10 NOTE — Transfer of Care (Signed)
Immediate Anesthesia Transfer of Care Note  Patient: Mary [name]  Procedure(s) Performed: COLONOSCOPY WITH PROPOFOL POLYPECTOMY  Patient Location: Short Stay  Anesthesia Type:General  Level of Consciousness: awake  Airway & Oxygen Therapy: Patient Spontanous Breathing  Post-op Assessment: Report given to RN  Post vital signs: Reviewed and stable  Last Vitals:  Vitals Value Taken Time  BP 146/75 04/10/23 1046  Temp    Pulse 67 04/10/23 1047  Resp 23 04/10/23 1047  SpO2 98 % 04/10/23 1047  Vitals shown include unfiled device data.  Last Pain:  Vitals:   04/10/23 1011  PainSc: 8       Patients Stated Pain Goal: 7 (04/10/23 9323)  Complications: No notable events documented.

## 2023-04-10 NOTE — Interval H&P Note (Signed)
History and Physical Interval Note:  04/10/2023 8:10 AM  Mary [name]  has presented today for surgery, with the diagnosis of rectal bleeding,family hx of colon cancer.  The various methods of treatment have been discussed with the patient and family. After consideration of risks, benefits and other options for treatment, the patient has consented to  Procedure(s) with comments: COLONOSCOPY WITH PROPOFOL (N/A) - 9:00 am, asa 3, pt knows to arrive at 8:30 as a surgical intervention.  The patient's history has been reviewed, patient examined, no change in status, stable for surgery.  I have reviewed the patient's chart and labs.  Questions were answered to the patient's satisfaction.     Katrinka Blazing [place]

## 2023-04-10 NOTE — Op Note (Signed)
[facility] Patient Name: Mary [name] Procedure Date: 04/10/2023 9:57 AM MRN: [id number] Date of Birth: 04/30/1981 Attending MD: Katrinka Blazing , , [id number] CSN: [id number] Age: 42 Admit Type: Outpatient Procedure:                Colonoscopy Indications:              Rectal bleeding, Constipation Providers:                Katrinka Blazing, Angelica Ran, Dyann Ruddle, Elinor Parkinson Referring MD:              Medicines:                Monitored Anesthesia Care Complications:            No immediate complications. Estimated Blood Loss:     Estimated blood loss: none. Procedure:                Pre-Anesthesia Assessment:                           - Prior to the procedure, a History and Physical                            was performed, and patient medications, allergies                            and sensitivities were reviewed. The patient's                            tolerance of previous anesthesia was reviewed.                           - The risks and benefits of the procedure and the                            sedation options and risks were discussed with the                            patient. All questions were answered and informed                            consent was obtained.                           - ASA Grade Assessment: II - A patient with mild                            systemic disease.                           After obtaining informed consent, the colonoscope                            was passed under direct vision. Throughout the  procedure, the patient's blood pressure, pulse, and                            oxygen saturations were monitored continuously. The                            PCF-HQ190L ([id number] scope was introduced through                            the anus and advanced to the the cecum, identified                            by appendiceal orifice and ileocecal valve. The                             colonoscopy was performed without difficulty. The                            patient tolerated the procedure well. The quality                            of the bowel preparation was fair. Scope In: Scope Out: 10:40:13 AM Scope Withdrawal Time: 0 hours 18 minutes 31 seconds  Findings:      The perianal and digital rectal examinations were normal.      Two sessile polyps were found in the rectum. The polyps were 3 to 6 mm       in size. These polyps were removed with a cold snare. Resection and       retrieval were complete.      Non-bleeding internal hemorrhoids were found during retroflexion. The       hemorrhoids were small. Impression:               - Preparation of the colon was fair.                           - Two 3 to 6 mm polyps in the rectum, removed with                            a cold snare. Resected and retrieved.                           - Non-bleeding internal hemorrhoids. Moderate Sedation:      Per Anesthesia Care Recommendation:           - Discharge patient to home (ambulatory).                           - Resume previous diet.                           - Await pathology results.                           - Repeat colonoscopy in 3 years because the bowel  preparation was suboptimal.                           - Take Linzess 145 mcg every day. Procedure Code(s):        --- Professional ---                           [phone], Colonoscopy, flexible; with removal of                            tumor(s), polyp(s), or other lesion(s) by snare                            technique Diagnosis Code(s):        --- Professional ---                           K64.8, Other hemorrhoids                           D12.8, Benign neoplasm of rectum                           K62.5, Hemorrhage of anus and rectum                           K59.00, Constipation, unspecified CPT copyright 2022 [employer]. All rights reserved. The codes  documented in this report are preliminary and upon coder review may  be revised to meet current compliance requirements. Katrinka Blazing, MD Katrinka Blazing,  04/10/2023 10:46:33 AM This report has been signed electronically. Number of Addenda: 0

## 2023-04-11 ENCOUNTER — Encounter (INDEPENDENT_AMBULATORY_CARE_PROVIDER_SITE_OTHER): Payer: Self-pay | Admitting: *Deleted

## 2023-04-11 LAB — SURGICAL PATHOLOGY

## 2023-04-12 ENCOUNTER — Encounter (HOSPITAL_COMMUNITY): Payer: Self-pay | Admitting: Gastroenterology

## 2023-04-16 ENCOUNTER — Encounter (INDEPENDENT_AMBULATORY_CARE_PROVIDER_SITE_OTHER): Payer: Self-pay | Admitting: *Deleted

## 2023-04-22 DIAGNOSIS — Z419 Encounter for procedure for purposes other than remedying health state, unspecified: Secondary | ICD-10-CM | POA: Diagnosis not present

## 2023-04-30 DIAGNOSIS — K219 Gastro-esophageal reflux disease without esophagitis: Secondary | ICD-10-CM | POA: Insufficient documentation

## 2023-04-30 DIAGNOSIS — F331 Major depressive disorder, recurrent, moderate: Secondary | ICD-10-CM | POA: Insufficient documentation

## 2023-04-30 DIAGNOSIS — E119 Type 2 diabetes mellitus without complications: Secondary | ICD-10-CM | POA: Insufficient documentation

## 2023-04-30 DIAGNOSIS — Z6841 Body Mass Index (BMI) 40.0 and over, adult: Secondary | ICD-10-CM | POA: Insufficient documentation

## 2023-04-30 NOTE — Progress Notes (Unsigned)
Name: Mary [name] DOB: May 12, 1981 MRN: [id number]  History of Present Illness: Mary [name] is a 42 y.o. female who presents today as a new patient at [facility] [place]. All available relevant medical records have been reviewed.   She reports chief complaint of mixed urinary incontinence - reports urge incontinence + stress incontinence with cough/laugh/sneeze/heavy lifting.  She reports urinary frequency, nocturia, urgency, and urge incontinence. Voiding 6-8x/day and 3-4x/night on average. Leaking 6x/day on average; using 2 pads per day on average. She denies caffeine intake. She denies prior attempted treatment for these symptoms.   She reports history of obstructive sleep apnea and denies wearing CPAP routinely (in process of getting new fit).  She denies significant fluid intake within 3 hours prior to bedtime or overnight. She denies caffeine intake within 8 hours prior to bedtime. She reports routinely experiencing lower extremity edema during the day.  She denies dysuria, gross hematuria, straining to void, or sensations of incomplete emptying.  She reports that she is working on weight loss.    Fall Screening: Do you usually have a device to assist in your mobility? No   Medications: Current Outpatient Medications  Medication Sig Dispense Refill   mirabegron ER (MYRBETRIQ) 25 MG TB24 tablet Take 2 tablets (50 mg total) by mouth daily. 60 tablet 2   torsemide (DEMADEX) 20 MG tablet TAKE ONE TABLET ONCE DAILY 30 tablet 0   albuterol (VENTOLIN HFA) 108 (90 Base) MCG/ACT inhaler Inhale 2 puffs into the lungs every 6 (six) hours as needed for wheezing or shortness of breath. 18 g 5   Atogepant (QULIPTA) 10 MG TABS Take 10 mg by mouth daily. 30 tablet 3   celecoxib (CELEBREX) 200 MG capsule Take 1 capsule (200 mg total) by mouth 2 (two) times daily. 60 capsule 0   Cholecalciferol (VITAMIN D3) 1000 units CAPS Take 3,000 Units by mouth daily.     citalopram  (CELEXA) 20 MG tablet TAKE ONE TABLET ONCE DAILY 30 tablet 5   dapagliflozin propanediol (FARXIGA) 10 MG TABS tablet Take 1 tablet (10 mg total) by mouth daily before breakfast. (Patient not taking: Reported on 05/01/2023) 90 tablet 1   DULERA 100-5 MCG/ACT AERO INHALE 2 PUFFS TWICE DAILY 13 g 0   eszopiclone (LUNESTA) 1 MG TABS tablet Take 1 tablet (1 mg total) by mouth at bedtime as needed for sleep. Take immediately before bedtime 30 tablet 0   fluticasone (FLONASE) 50 MCG/ACT nasal spray 1 SPRAY IN EACH NOSTRIL ONCE A DAY 16 g 0   hydrOXYzine (VISTARIL) 25 MG capsule TAKE ONE CAPSULE UP TO EVERY 8 HOURS AS NEEDED 60 capsule 1   ketoconazole (NIZORAL) 2 % cream Apply 1 Application topically daily. 15 g 0   levocetirizine (XYZAL) 5 MG tablet Take 1 tablet (5 mg total) by mouth every evening. 90 tablet 3   losartan (COZAAR) 100 MG tablet 100 mg one daily 30 tablet 4   melatonin 3 MG TABS tablet Take 10 mg by mouth at bedtime.     omeprazole (PRILOSEC) 40 MG capsule Take 40 mg by mouth daily.     ondansetron (ZOFRAN-ODT) 4 MG disintegrating tablet TAKE 1 TABLET EVERY 8 HOURS AS NEEDED FOR NAUSEA OR VOMITING 20 tablet 0   pregabalin (LYRICA) 75 MG capsule One tid 90 capsule 2   No current facility-administered medications for this visit.    Allergies: Allergies  Allergen Reactions   Buprenorphine Hcl Anaphylaxis    "it stops my heart"  Morphine Anaphylaxis    i quit breathing    Morphine And Codeine Anaphylaxis    "it stops my heart"   Penicillins Anaphylaxis and Other (See Comments)    Has patient had a PCN reaction causing immediate rash, facial/tongue/throat swelling, SOB or lightheadedness with hypotension: Yes Has patient had a PCN reaction causing severe rash involving mucus membranes or skin necrosis: Yes Has patient had a PCN reaction that required hospitalization: Yes Has patient had a PCN reaction occurring within the last 10 years: No If all of the above answers are "NO",  then may proceed with Cephalosporin use.    Gabapentin Hives and Other (See Comments)    Per patient "hallucination"   Nsaids    Sulfa Antibiotics     Other reaction(s): Unknown   Ibuprofen-Acetaminophen Nausea And Vomiting   Latex Rash   Tylenol [Acetaminophen] Nausea And Vomiting    Past Medical History:  Diagnosis Date   Allergy    Anxiety    Asthma    Bipolar 1 disorder (HCC)    Bulging lumbar disc    BV (bacterial vaginosis) 02/18/2018   Cancer of cervix (HCC)    Carpal tunnel syndrome on right    Cervical cancer (HCC)    Chronic back pain    COPD (chronic obstructive pulmonary disease) (HCC)    Depression    Hypertension    Migraines    Physiological ovarian cysts    Plantar fasciitis    PTSD (post-traumatic stress disorder)    Rheumatoid arthritis (HCC)    S/P partial hysterectomy    Vaginal Pap smear, abnormal    Past Surgical History:  Procedure Laterality Date   ABDOMINAL HYSTERECTOMY     ABDOMINAL HYSTERECTOMY     COLONOSCOPY WITH PROPOFOL N/A 04/10/2023   Procedure: COLONOSCOPY WITH PROPOFOL;  Surgeon: Dolores Frame, MD;  Location: [facility];  Service: Gastroenterology;  Laterality: N/A;  9:00 am, asa 3, pt knows to arrive at 8:30   EYE SURGERY  age 74   Artificial right eye   ORIF ELBOW FRACTURE Left 11/18/2019   Procedure: OPEN REDUCTION WITH REPAIR VERSUS FRAGMENT EXCISION CAPITELLUM FRACTURE LEFT ELBOW AND LIGAMENTOUS RECONSTRUCTION AS NECESSARY;  Surgeon: Dominica Severin, MD;  Location: [facility] OR;  Service: Orthopedics;  Laterality: Left;  2 HRS   POLYPECTOMY  04/10/2023   Procedure: POLYPECTOMY;  Surgeon: Dolores Frame, MD;  Location: [facility];  Service: Gastroenterology;;   Family History  Problem Relation Age of Onset   Stroke Mother        TIA   Asthma Mother    Cancer Mother        leukemia   Other Mother        blood platelets are dropping   Heart attack Father    Hypertension Father    Cancer Father         skin cancer   Stroke Father    Colon polyps Father    Fibromyalgia Sister    Cancer Sister        cervical   Cancer Sister        cervical   Cancer Brother        thyroid cancer   Bipolar disorder Brother    Anxiety disorder Brother    Post-traumatic stress disorder Brother    Diabetes Maternal Grandmother    Heart Problems Maternal Grandmother    Anuerysm Maternal Grandfather    Seizures Maternal Grandfather    Cancer Paternal Grandmother  breast   Asthma Daughter    Sleep apnea Son    Cancer Maternal Aunt        breast cancer   Cancer Paternal Aunt    Colon cancer Maternal Uncle 6 - 79   Lung cancer Maternal Uncle    Social History   Socioeconomic History   Marital status: Single    Spouse name: Not on file   Number of children: 3   Years of education: 12   Highest education level: High school graduate  Occupational History   Occupation: Seeking disability  Tobacco Use   Smoking status: Never   Smokeless tobacco: Never  Vaping Use   Vaping status: Never Used  Substance and Sexual Activity   Alcohol use: No   Drug use: Not Currently    Types: Marijuana   Sexual activity: Not Currently    Birth control/protection: Surgical    Comment: hyst  Other Topics Concern   Not on file  Social History Narrative   Lives at home with father.   Right-handed.   No daily caffeine per day.   Social Determinants of Health   Financial Resource Strain: High Risk (03/06/2023)   Overall Financial Resource Strain (CARDIA)    Difficulty of Paying Living Expenses: Very hard  Food Insecurity: Food Insecurity Present (03/06/2023)   Hunger Vital Sign    Worried About Running Out of Food in the Last Year: Sometimes true    Ran Out of Food in the Last Year: Sometimes true  Transportation Needs: No Transportation Needs (03/06/2023)   PRAPARE - Administrator, Civil Service (Medical): No    Lack of Transportation (Non-Medical): No  Physical Activity: Sufficiently  Active (03/06/2023)   Exercise Vital Sign    Days of Exercise per Week: 7 days    Minutes of Exercise per Session: 30 min  Stress: No Stress Concern Present (03/06/2023)   [employer] of Occupational Health - Occupational Stress Questionnaire    Feeling of Stress : Only a little  Recent Concern: Stress - Stress Concern Present (01/30/2023)   [employer] of Occupational Health - Occupational Stress Questionnaire    Feeling of Stress : Very much  Social Connections: Moderately Isolated (03/06/2023)   Social Connection and Isolation Panel [NHANES]    Frequency of Communication with Friends and Family: More than three times a week    Frequency of Social Gatherings with Friends and Family: Three times a week    Attends Religious Services: More than 4 times per year    Active Member of Clubs or Organizations: No    Attends Banker Meetings: Never    Marital Status: Divorced  Catering manager Violence: Not At Risk (03/06/2023)   Humiliation, Afraid, Rape, and Kick questionnaire    Fear of Current or Ex-Partner: No    Emotionally Abused: No    Physically Abused: No    Sexually Abused: No    SUBJECTIVE  Review of Systems Constitutional: Patient reports unintentional weight gain lntegumentary: Patient denies any rashes or pruritus Eyes: Patient reports dry eyes ENT: Patient reports dry mouth Cardiovascular: Patient denies chest pain or syncope Respiratory: Patient denies shortness of breath Gastrointestinal: Patient denies nausea, vomiting, or diarrhea. Reports chronic constipation. Musculoskeletal: Patient denies muscle cramps or weakness Neurologic: Patient denies convulsions or seizures Psychiatric: Patient denies memory problems Allergic/Immunologic: Patient denies recent allergic reaction(s) Hematologic/Lymphatic: Patient denies bleeding tendencies Endocrine: Patient denies heat/cold intolerance  GU: As per HPI.  OBJECTIVE Vitals:   05/01/23 1024  BP:  (!) 157/84  Pulse: (!) 55  Temp: 97.9 F (36.6 C)   There is no height or weight on file to calculate BMI.  Physical Examination  Constitutional: No obvious distress; patient is non-toxic appearing  Cardiovascular: No visible lower extremity edema.  Respiratory: The patient does not have audible wheezing/stridor; respirations do not appear labored  Gastrointestinal: Abdomen non-distended Musculoskeletal: Normal ROM of UEs  Skin: No obvious rashes/open sores  Neurologic: CN 2-12 grossly intact Psychiatric: Answered questions appropriately with normal affect  Hematologic/Lymphatic/Immunologic: No obvious bruises or sites of spontaneous bleeding   No UA - pt denied needing to void in office today PVR: 3 ml  ASSESSMENT Mixed stress and urge urinary incontinence - Plan: Urinalysis, Routine w reflex microscopic, BLADDER SCAN AMB NON-IMAGING, Ambulatory referral to Urology, mirabegron ER (MYRBETRIQ) 25 MG TB24 tablet  Dry mouth and eyes - Plan: mirabegron ER (MYRBETRIQ) 25 MG TB24 tablet  Chronic constipation - Plan: mirabegron ER (MYRBETRIQ) 25 MG TB24 tablet  Marijuana use  We discussed the different forms of urinary incontinence, such as stress and urge incontinence, and described how symptoms are consistent with mixed urinary incontinence.  1. For treatment of stress urinary incontinence: We discussed the pathophysiology of this condition and likely exacerbating factors (obesity, cough secondary to COPD / smoking).  Reviewed management options: - Nonsurgical options include weight loss, Kegel exercises, with or without physical therapy, as well as an incontinence pessary.  - Surgical options include a midurethral sling, a Burch urethropexy, and transurethral injection of a bulking agent.   She would like to proceed with surgical consultation. Internal referral placed to Dr. Sherron Monday at [place] office.   2. For treatment of OAB with urinary frequency, nocturia, urgency, and  urge incontinence:  We discussed the symptoms of overactive bladder (OAB), which include urinary urgency, frequency, nocturia, with or without urge incontinence.   While we may not know the exact etiology of OAB, several risk factors can be identified.  - We discussed neurogenic risk factors for OAB-type symptoms including T2DM.  - Likely exacerbated by diuretic use (Torsemide) and obesity.   We discussed the following management options in detail including potential benefits, risks, and side effects: Behavioral therapy: Modify fluid intake Decreasing bladder irritants (such as caffeine, acidic foods, spicy foods, alcohol) Urge suppression strategies Bladder retraining / timed voiding Double voiding Medication(s): - Not a safe candidate for anticholinergic medications due to potential side effect risks - may exacerbate her pre-existing dry eyes, dry mouth, and constipation.  - For beta-3 agonist medication, we discussed the risk for urinary retention and the potential side effect of elevated blood pressure specific to Myrbetriq (which is more likely to occur in individuals with uncontrolled hypertension).  For refractory cases: PTNS (posterior tibial nerve stimulation) Sacral neuromodulation trial (Medtronic lnterStim or Axonics implant) Bladder Botox injections  She decided to proceed with Myrbetriq 50 mg daily. Given Gemtesa samples today for use while awaiting insurance authorization for Myrbetriq.   Advised PVR check at next visit with Dr. Sherron Monday re: OAB follow up. Pt verbalized understanding and agreement. All questions were answered.  PLAN Advised the following: 1. Myrbetriq 50 mg daily prescribed.  2. Gemtesa 75 mg daily (samples given) while waiting for Myrbetriq approval. 3. Minimize caffeine intake. 4. Work on timed voiding. 5. Return for 1st available f/u with Dr. Sherron Monday in [facility] for [facility] surgery consult & PVR for OAB f/u.  Orders Placed This Encounter   Procedures   Urinalysis, Routine w reflex microscopic  Ambulatory referral to Urology    Referral Priority:   Routine    Referral Type:   Consultation    Referral Reason:   Specialty Services Required    Requested Specialty:   Urology    Number of Visits Requested:   1   BLADDER SCAN AMB NON-IMAGING   It has been explained that the patient is to follow regularly with their PCP in addition to all other providers involved in their care and to follow instructions provided by these respective offices. Patient advised to contact urology clinic if any urologic-pertaining questions, concerns, new symptoms or problems arise in the interim period.  Patient Instructions             Overactive bladder (OAB) overview for patients:  Symptoms may include: urinary urgency ("gotta go" feeling) urinary frequency (voiding >8 times per day) night time urination (nocturia) urge incontinence of urine (UUI)  While we do not know the exact etiology of OAB, several treatment options exist including:  Behavioral therapy: Reducing fluid intake Decreasing bladder stimulants (such as caffeine) and irritants (such as acidic food, spicy foods, alcohol) Urge suppression strategies Bladder retraining via timed voiding  Pelvic floor physical therapy  Medication(s) - can use one or both of the drug classes below. Anticholinergic / antimuscarinic medications:  Mechanism of action: Activate M3 receptors to reduce detrusor stimulation and increase bladder capacity   (parasympathetic nervous system). Effect: Relaxes the bladder to decrease overactivity, increase bladder storage capacity, and increase time between voids. Onset: Slow acting (may take 8-12 weeks to determine efficacy). Medications include: Vesicare (Solifenacin), Ditropan (Oxybutynin), Detrol (Tolterodine), Toviaz (Fesoterodine), Sanctura (Trospium), Urispas (Flavoxate), Enablex (Darifenacin), Bentyl (Dicyclomine), Levsin (Hyoscyamine  ). Potential side effects include but are not limited to: Dry eyes, dry mouth, constipation, cognitive impairment, dementia risk with long term use, and urinary retention/ incomplete bladder emptying. Insurance companies generally prefer for patients to try 1-2 anticholinergic / antimuscarinic medications first due to low cost. Some exceptions are made based on patient-specific comorbidities / risk factors. Beta-3 agonist medications: Mechanism of action: Stimulates selective B3 adrenergic receptors to cause smooth muscle bladder relaxation (sympathetic nervous system). Effect: Relaxes the bladder to decrease overactivity, increase bladder storage capacity, and increase time between voids. Onset: Slow acting (may take 8-12 weeks to determine efficacy). Medications include: Myrbetriq (Mirabegron) and Vibegron Leslye Peer). Potential side effects include but are not limited to: urinary retention / incomplete bladder emptying and elevated blood pressure (more likely to occur in individuals with pre-existing uncontrolled hypertension). These medications tend to be more expensive than the anticholinergic / antimuscarinic medications.   For patients with refractory OAB (if the above treatment options have been unsuccessful): Posterior tibial nerve stimulation (PTNS). Small acupuncture-type needle inserted near ankle with electric current to stimulate bladder via posterior tibial nerve pathway. Initially requires 12 weekly in-office treatments lasting 30 minutes each; followed by monthly in-office treatments lasting 30 minutes each for 1 year.  Bladder Botox injections. How it is done: Typically done via in-office cystoscopy; sometimes done in the OR depending on the situation. The bladder is numbed with lidocaine instilled via a catheter. Then the urologist injects Botox into the bladder muscle wall in about 20 locations. Causes local paralysis of the bladder muscle at the injection sites to reduce bladder  muscle overactivity / spasms. The effect lasts for approximately 6 months and cannot be reversed once performed. Risks may included but are not limited to: infection, incomplete bladder emptying/ urinary retention, short term need for self-catheterization or indwelling  catheter, and need for repeat therapy. There is a 5-12% chance of needing to catheterize with Botox - that usually resolves in a few months as the Botox wears off. Typically Botox injections would need to be repeated every 3-12 months since this is not a permanent therapy.  Sacral neuromodulation trial (Medtronic lnterStim or Axonics implant). Sacral neuromodulation is FDA-approved for uncontrolled urinary urgency, urinary frequency, urinary urge incontinence, non-obstructive urinary retention, or fecal incontinence. It is not FDA-approved as a treatment for pain. The goal of this therapy is at least a 50% improvement in symptoms. It is NOT realistic to expect a 100% cure. This is a a 2-step outpatient procedure. After a successful test period, a permanent wire and generator are placed in the OR. We discussed the risk of infection. We reviewed the fact that about 30% of patients fail the test phase and are not candidates for permanent generator placement. During the 1-2 week trial phase, symptoms are documented by the patient to determine response. If patient gets at least a 50% improvement in symptoms, they may then proceed with Step 2. Step 1: Trial lead placement. Per physician discretion, may done one of two ways: Percutaneous nerve evaluation (PNE) in the [facility] urology office. Performed by urologist under local anesthesia (numbing the area with lidocaine) using a spinal needle for placement of test wire, which usually stays in place for 5-7 days to determine therapy response. Test lead placement in OR under anesthesia. Usually stays in place 2 weeks to determine therapy response. > Step 2: Permanent implantation of sacral  neuromodulation device, which is performed in the OR.  Sacral neuromodulation implants: All are conditionally MRI safe. Manufacturer: Medtronic Website: [employer] therapy/right-for-you.html Options: lnterStim X: Non-rechargeable. The battery lasts 10 years on average. lnterStim Micro: Rechargeable. The battery lasts 15 years on average and must be charged routinely. Approximately 50% smaller implant than lnterStim X implant.  Manufacturer: Axonics Website: Findrealrelief.axonics.com Options: Non-rechargeable (Axonics F15): The battery lasts 15 years on average. Rechargeable (Axonics R20): The battery lasts 20 years on average and must be charged in office for about 1 hour every 6-10 months on average. Approximately 50% smaller implant than Axonics non-rechargeable implant.  Note: Generally the rechargeable devices are only advised for very small or thin patients who may not have sufficient adipose tissue to comfortably overlay the implanted device.   Electronically signed by:  Donnita Falls, MSN, FNP-C, CUNP 05/01/2023 11:54 AM

## 2023-05-01 ENCOUNTER — Ambulatory Visit (INDEPENDENT_AMBULATORY_CARE_PROVIDER_SITE_OTHER): Payer: 59 | Admitting: Urology

## 2023-05-01 ENCOUNTER — Encounter: Payer: Self-pay | Admitting: Urology

## 2023-05-01 VITALS — BP 157/84 | HR 55 | Temp 97.9°F

## 2023-05-01 DIAGNOSIS — N3946 Mixed incontinence: Secondary | ICD-10-CM

## 2023-05-01 DIAGNOSIS — R682 Dry mouth, unspecified: Secondary | ICD-10-CM | POA: Diagnosis not present

## 2023-05-01 DIAGNOSIS — K5909 Other constipation: Secondary | ICD-10-CM

## 2023-05-01 DIAGNOSIS — H04123 Dry eye syndrome of bilateral lacrimal glands: Secondary | ICD-10-CM

## 2023-05-01 DIAGNOSIS — F1290 Cannabis use, unspecified, uncomplicated: Secondary | ICD-10-CM | POA: Insufficient documentation

## 2023-05-01 DIAGNOSIS — Z970 Presence of artificial eye: Secondary | ICD-10-CM | POA: Insufficient documentation

## 2023-05-01 LAB — BLADDER SCAN AMB NON-IMAGING: Scan Result: 3

## 2023-05-01 MED ORDER — MIRABEGRON ER 25 MG PO TB24
50.0000 mg | ORAL_TABLET | Freq: Every day | ORAL | 2 refills | Status: DC
Start: 2023-05-01 — End: 2023-07-09

## 2023-05-01 NOTE — Patient Instructions (Signed)
Overactive bladder (OAB) overview for patients:  Symptoms may include: urinary urgency ("gotta go" feeling) urinary frequency (voiding >8 times per day) night time urination (nocturia) urge incontinence of urine (UUI)  While we do not know the exact etiology of OAB, several treatment options exist including:  Behavioral therapy: Reducing fluid intake Decreasing bladder stimulants (such as caffeine) and irritants (such as acidic food, spicy foods, alcohol) Urge suppression strategies Bladder retraining via timed voiding  Pelvic floor physical therapy  Medication(s) - can use one or both of the drug classes below. Anticholinergic / antimuscarinic medications:  Mechanism of action: Activate M3 receptors to reduce detrusor stimulation and increase bladder capacity  (parasympathetic nervous system). Effect: Relaxes the bladder to decrease overactivity, increase bladder storage capacity, and increase time between voids. Onset: Slow acting (may take 8-12 weeks to determine efficacy). Medications include: Vesicare (Solifenacin), Ditropan (Oxybutynin), Detrol (Tolterodine), Toviaz (Fesoterodine), Sanctura (Trospium), Urispas (Flavoxate), Enablex (Darifenacin), Bentyl (Dicyclomine), Levsin (Hyoscyamine ). Potential side effects include but are not limited to: Dry eyes, dry mouth, constipation, cognitive impairment, dementia risk with long term use, and urinary retention/ incomplete bladder emptying. Insurance companies generally prefer for patients to try 1-2 anticholinergic / antimuscarinic medications first due to low cost. Some exceptions are made based on patient-specific comorbidities / risk factors. Beta-3 agonist medications: Mechanism of action: Stimulates selective B3 adrenergic receptors to cause smooth muscle bladder relaxation (sympathetic nervous system). Effect: Relaxes the bladder to decrease overactivity, increase bladder storage capacity, and increase time between voids. Onset:  Slow acting (may take 8-12 weeks to determine efficacy). Medications include: Myrbetriq (Mirabegron) and Vibegron (Gemtesa). Potential side effects include but are not limited to: urinary retention / incomplete bladder emptying and elevated blood pressure (more likely to occur in individuals with pre-existing uncontrolled hypertension). These medications tend to be more expensive than the anticholinergic / antimuscarinic medications.   For patients with refractory OAB (if the above treatment options have been unsuccessful): Posterior tibial nerve stimulation (PTNS). Small acupuncture-type needle inserted near ankle with electric current to stimulate bladder via posterior tibial nerve pathway. Initially requires 12 weekly in-office treatments lasting 30 minutes each; followed by monthly in-office treatments lasting 30 minutes each for 1 year.  Bladder Botox injections. How it is done: Typically done via in-office cystoscopy; sometimes done in the OR depending on the situation. The bladder is numbed with lidocaine instilled via a catheter. Then the urologist injects Botox into the bladder muscle wall in about 20 locations. Causes local paralysis of the bladder muscle at the injection sites to reduce bladder muscle overactivity / spasms. The effect lasts for approximately 6 months and cannot be reversed once performed. Risks may included but are not limited to: infection, incomplete bladder emptying/ urinary retention, short term need for self-catheterization or indwelling catheter, and need for repeat therapy. There is a 5-12% chance of needing to catheterize with Botox - that usually resolves in a few months as the Botox wears off. Typically Botox injections would need to be repeated every 3-12 months since this is not a permanent therapy.  Sacral neuromodulation trial (Medtronic lnterStim or Axonics implant). Sacral neuromodulation is FDA-approved for uncontrolled urinary urgency, urinary frequency,  urinary urge incontinence, non-obstructive urinary retention, or fecal incontinence. It is not FDA-approved as a treatment for pain. The goal of this therapy is at least a 50% improvement in symptoms. It is NOT realistic to expect a 100% cure. This is a a 2-step outpatient procedure. After a successful test period, a permanent wire and generator are   placed in the OR. We discussed the risk of infection. We reviewed the fact that about 30% of patients fail the test phase and are not candidates for permanent generator placement. During the 1-2 week trial phase, symptoms are documented by the patient to determine response. If patient gets at least a 50% improvement in symptoms, they may then proceed with Step 2. Step 1: Trial lead placement. Per physician discretion, may done one of two ways: Percutaneous nerve evaluation (PNE) in the [facility] office. Performed by urologist under local anesthesia (numbing the area with lidocaine) using a spinal needle for placement of test wire, which usually stays in place for 5-7 days to determine therapy response. Test lead placement in OR under anesthesia. Usually stays in place 2 weeks to determine therapy response. > Step 2: Permanent implantation of sacral neuromodulation device, which is performed in the OR.  Sacral neuromodulation implants: All are conditionally MRI safe. Manufacturer: Medtronic Website: www.medtronic.com/[place]-en/patients/treatments-therapies/neurostimulator-overactive-bladder/getting therapy/right-for-you.html Options: lnterStim X: Non-rechargeable. The battery lasts 10 years on average. lnterStim Micro: Rechargeable. The battery lasts 15 years on average and must be charged routinely. Approximately 50% smaller implant than lnterStim X implant.  Manufacturer: Axonics Website: Findrealrelief.axonics.com Options: Non-rechargeable (Axonics F15): The battery lasts 15 years on average. Rechargeable (Axonics R20): The battery lasts 20 years on  average and must be charged in office for about 1 hour every 6-10 months on average. Approximately 50% smaller implant than Axonics non-rechargeable implant.  Note: Generally the rechargeable devices are only advised for very small or thin patients who may not have sufficient adipose tissue to comfortably overlay the implanted device.   

## 2023-05-01 NOTE — Progress Notes (Signed)
post void residual=3

## 2023-05-07 ENCOUNTER — Encounter: Payer: Self-pay | Admitting: Urology

## 2023-05-07 ENCOUNTER — Other Ambulatory Visit: Payer: Self-pay | Admitting: Family Medicine

## 2023-05-07 ENCOUNTER — Ambulatory Visit (INDEPENDENT_AMBULATORY_CARE_PROVIDER_SITE_OTHER): Payer: 59 | Admitting: Urology

## 2023-05-07 ENCOUNTER — Ambulatory Visit (HOSPITAL_COMMUNITY): Payer: 59 | Admitting: Psychiatry

## 2023-05-07 VITALS — BP 152/103 | HR 52 | Ht 69.0 in | Wt 362.0 lb

## 2023-05-07 DIAGNOSIS — M25471 Effusion, right ankle: Secondary | ICD-10-CM

## 2023-05-07 DIAGNOSIS — F411 Generalized anxiety disorder: Secondary | ICD-10-CM

## 2023-05-07 DIAGNOSIS — N3946 Mixed incontinence: Secondary | ICD-10-CM

## 2023-05-07 DIAGNOSIS — R110 Nausea: Secondary | ICD-10-CM

## 2023-05-07 LAB — URINALYSIS, COMPLETE
Bilirubin, UA: NEGATIVE
Glucose, UA: NEGATIVE
Ketones, UA: NEGATIVE
Leukocytes,UA: NEGATIVE
Nitrite, UA: NEGATIVE
Protein,UA: NEGATIVE
RBC, UA: NEGATIVE
Specific Gravity, UA: >1.03 — ABNORMAL HIGH (ref 1.005–1.030)
Urobilinogen, Ur: 0.2 mg/dL (ref 0.2–1.0)
pH, UA: 5.5 (ref 5.0–7.5)

## 2023-05-07 LAB — MICROSCOPIC EXAMINATION: Epithelial Cells (non renal): >10 /HPF — AB (ref 0–10)

## 2023-05-07 NOTE — Patient Instructions (Signed)

## 2023-05-07 NOTE — Progress Notes (Signed)
05/07/2023 9:50 AM   [name] 20-Jan-1981 [id number]  Referring provider: [name], DO [address] [name],  [place] [address]  Chief Complaint  Patient presents with   New Patient (Initial Visit)   Urinary Incontinence    HPI: I was consulted to assess the patient's urinary incontinence.  She leaks with coughing sneezing bending lifting.  She has urge incontinence.  Sometimes she has bedwetting.  She wears 2-3 pads a day moderately wet.  Both components are significant  She voids every 2 hours and is up to 6 times a night.  She has significant ankle edema but does take a diuretic  She recently failed Gemtesa  She has had a hysterectomy  No history of kidney stones bladder surgery or bladder infections    PMH: Past Medical History:  Diagnosis Date   Allergy    Anxiety    Asthma    Bipolar 1 disorder (HCC)    Bulging lumbar disc    BV (bacterial vaginosis) 02/18/2018   Cancer of cervix (HCC)    Carpal tunnel syndrome on right    Cervical cancer (HCC)    Chronic back pain    COPD (chronic obstructive pulmonary disease) (HCC)    Depression    Hypertension    Migraines    Physiological ovarian cysts    Plantar fasciitis    PTSD (post-traumatic stress disorder)    Rheumatoid arthritis (HCC)    S/P partial hysterectomy    Vaginal Pap smear, abnormal     Surgical History: Past Surgical History:  Procedure Laterality Date   ABDOMINAL HYSTERECTOMY     ABDOMINAL HYSTERECTOMY     COLONOSCOPY WITH PROPOFOL N/A 04/10/2023   Procedure: COLONOSCOPY WITH PROPOFOL;  Surgeon: Dolores Frame, MD;  Location: [facility];  Service: Gastroenterology;  Laterality: N/A;  9:00 am, asa 3, pt knows to arrive at 8:30   EYE SURGERY  age 42   Artificial right eye   ORIF ELBOW FRACTURE Left 11/18/2019   Procedure: OPEN REDUCTION WITH REPAIR VERSUS FRAGMENT EXCISION CAPITELLUM FRACTURE LEFT ELBOW AND LIGAMENTOUS RECONSTRUCTION AS NECESSARY;  Surgeon:  Dominica Severin, MD;  Location: [facility];  Service: Orthopedics;  Laterality: Left;  2 HRS   POLYPECTOMY  04/10/2023   Procedure: POLYPECTOMY;  Surgeon: Dolores Frame, MD;  Location: [facility];  Service: Gastroenterology;;    Home Medications:  Allergies as of 05/07/2023       Reactions   Buprenorphine Hcl Anaphylaxis   "it stops my heart"   Morphine Anaphylaxis   i quit breathing    Morphine And Codeine Anaphylaxis   "it stops my heart"   Penicillins Anaphylaxis, Other (See Comments)   Has patient had a PCN reaction causing immediate rash, facial/tongue/throat swelling, SOB or lightheadedness with hypotension: Yes Has patient had a PCN reaction causing severe rash involving mucus membranes or skin necrosis: Yes Has patient had a PCN reaction that required hospitalization: Yes Has patient had a PCN reaction occurring within the last 10 years: No If all of the above answers are "NO", then may proceed with Cephalosporin use.   Gabapentin Hives, Other (See Comments)   Per patient "hallucination"   Nsaids    Sulfa Antibiotics    Other reaction(s): Unknown   Ibuprofen-acetaminophen Nausea And Vomiting   Latex Rash   Tylenol [acetaminophen] Nausea And Vomiting        Medication List        Accurate as of May 07, 2023  9:50 AM. If you have any questions, ask your nurse or doctor.          albuterol 108 (90 Base) MCG/ACT inhaler Commonly known as: VENTOLIN HFA Inhale 2 puffs into the lungs every 6 (six) hours as needed for wheezing or shortness of breath.   celecoxib 200 MG capsule Commonly known as: CeleBREX Take 1 capsule (200 mg total) by mouth 2 (two) times daily.   citalopram 20 MG tablet Commonly known as: CELEXA TAKE ONE TABLET ONCE DAILY   dapagliflozin propanediol 10 MG Tabs tablet Commonly known as: Farxiga Take 1 tablet (10 mg total) by mouth daily before breakfast.   Dulera 100-5 MCG/ACT Aero Generic drug: mometasone-formoterol INHALE  2 PUFFS TWICE DAILY   eszopiclone 1 MG Tabs tablet Commonly known as: LUNESTA Take 1 tablet (1 mg total) by mouth at bedtime as needed for sleep. Take immediately before bedtime   fluticasone 50 MCG/ACT nasal spray Commonly known as: FLONASE 1 SPRAY IN EACH NOSTRIL ONCE A DAY   hydrOXYzine 25 MG capsule Commonly known as: VISTARIL TAKE ONE CAPSULE UP TO EVERY 8 HOURS AS NEEDED   ketoconazole 2 % cream Commonly known as: NIZORAL Apply 1 Application topically daily.   levocetirizine 5 MG tablet Commonly known as: XYZAL Take 1 tablet (5 mg total) by mouth every evening.   losartan 100 MG tablet Commonly known as: COZAAR 100 mg one daily   melatonin 3 MG Tabs tablet Take 10 mg by mouth at bedtime.   mirabegron ER 25 MG Tb24 tablet Commonly known as: MYRBETRIQ Take 2 tablets (50 mg total) by mouth daily.   omeprazole 40 MG capsule Commonly known as: PRILOSEC Take 40 mg by mouth daily.   ondansetron 4 MG disintegrating tablet Commonly known as: ZOFRAN-ODT TAKE 1 TABLET EVERY 8 HOURS AS NEEDED FOR NAUSEA OR VOMITING   pregabalin 75 MG capsule Commonly known as: LYRICA One tid   Qulipta 10 MG Tabs Generic drug: Atogepant Take 10 mg by mouth daily.   torsemide 20 MG tablet Commonly known as: DEMADEX TAKE ONE TABLET ONCE DAILY   Vitamin D3 1000 units Caps Take 3,000 Units by mouth daily.        Allergies:  Allergies  Allergen Reactions   Buprenorphine Hcl Anaphylaxis    "it stops my heart"   Morphine Anaphylaxis    i quit breathing    Morphine And Codeine Anaphylaxis    "it stops my heart"   Penicillins Anaphylaxis and Other (See Comments)    Has patient had a PCN reaction causing immediate rash, facial/tongue/throat swelling, SOB or lightheadedness with hypotension: Yes Has patient had a PCN reaction causing severe rash involving mucus membranes or skin necrosis: Yes Has patient had a PCN reaction that required hospitalization: Yes Has patient had a PCN  reaction occurring within the last 10 years: No If all of the above answers are "NO", then may proceed with Cephalosporin use.    Gabapentin Hives and Other (See Comments)    Per patient "hallucination"   Nsaids    Sulfa Antibiotics     Other reaction(s): Unknown   Ibuprofen-Acetaminophen Nausea And Vomiting   Latex Rash   Tylenol [Acetaminophen] Nausea And Vomiting    Family History: Family History  Problem Relation Age of Onset   Stroke Mother        TIA   Asthma Mother    Cancer Mother        leukemia   Other Mother  blood platelets are dropping   Heart attack Father    Hypertension Father    Cancer Father        skin cancer   Stroke Father    Colon polyps Father    Fibromyalgia Sister    Cancer Sister        cervical   Cancer Sister        cervical   Cancer Brother        thyroid cancer   Bipolar disorder Brother    Anxiety disorder Brother    Post-traumatic stress disorder Brother    Diabetes Maternal Grandmother    Heart Problems Maternal Grandmother    Anuerysm Maternal Grandfather    Seizures Maternal Grandfather    Cancer Paternal Grandmother        breast   Asthma Daughter    Sleep apnea Son    Cancer Maternal Aunt        breast cancer   Cancer Paternal Aunt    Colon cancer Maternal Uncle 57 - 79   Lung cancer Maternal Uncle     Social History:  reports that she has never smoked. She has never been exposed to tobacco smoke. She has never used smokeless tobacco. She reports that she does not currently use drugs after having used the following drugs: Marijuana. She reports that she does not drink alcohol.  ROS:                                        Physical Exam: There were no vitals taken for this visit.  Constitutional:  Alert and oriented, No acute distress. HEENT: [place] AT, moist mucus membranes.  Trachea midline, no masses. Cardiovascular: No clubbing, cyanosis, or edema. Respiratory: Normal respiratory effort,  no increased work of breathing. GI: Abdomen is soft, nontender, nondistended, no abdominal masses GU: Mild grade 2 hypermobility the bladder neck and no stress incontinence or prolapse Skin: No rashes, bruises or suspicious lesions. Lymph: No cervical or inguinal adenopathy. Neurologic: Grossly intact, no focal deficits, moving all 4 extremities. Psychiatric: Normal mood and affect.  Laboratory Data: Lab Results  Component Value Date   WBC 9.9 11/14/2022   HGB 14.0 11/14/2022   HCT 42.4 11/14/2022   MCV 86.5 11/14/2022   PLT 233 11/14/2022    Lab Results  Component Value Date   CREATININE 1.04 (H) 04/06/2023    No results found for: "PSA"  No results found for: "TESTOSTERONE"  Lab Results  Component Value Date   HGBA1C 5.3 11/30/2022    Urinalysis    Component Value Date/Time   COLORURINE YELLOW (A) 01/05/2020 1634   APPEARANCEUR Clear 07/26/2021 1135   LABSPEC 1.020 01/05/2020 1634   LABSPEC 1.005 09/15/2013 1913   PHURINE 7.0 01/05/2020 1634   GLUCOSEU Negative 07/26/2021 1135   GLUCOSEU Negative 09/15/2013 1913   HGBUR NEGATIVE 01/05/2020 1634   BILIRUBINUR Negative 07/26/2021 1135   BILIRUBINUR Negative 09/15/2013 1913   KETONESUR NEGATIVE 01/05/2020 1634   PROTEINUR Negative 07/26/2021 1135   PROTEINUR NEGATIVE 01/05/2020 1634   UROBILINOGEN 0.2 09/03/2014 1935   NITRITE Negative 07/26/2021 1135   NITRITE NEGATIVE 01/05/2020 1634   LEUKOCYTESUR Negative 07/26/2021 1135   LEUKOCYTESUR LARGE (A) 01/05/2020 1634   LEUKOCYTESUR Negative 09/15/2013 1913    Pertinent Imaging: Urine reviewed and sent for culture.  Chart reviewed  Assessment & Plan: Patient has mixed incontinence.  She has intermittent  bedwetting and milder frequency and significant nocturia.  She likely has a nocturnal diuresis role of urodynamics and cystoscopy discussed.  We will proceed accordingly.  I would not be surprised that she primarily has an overactive bladder  1. Mixed stress  and urge urinary incontinence  - Urinalysis, Complete   No follow-ups on file.  Martina Sinner, MD  [facility] [address] [place], [place] [address] [phone]

## 2023-05-20 NOTE — Progress Notes (Unsigned)
GI Office Note    Referring Provider: [name], DO Primary Care Physician:  [name], DO Primary Gastroenterologist: Mary Frame, MD  Date:  05/21/2023  ID:  [name], DOB [birth year], MRN [id number]  Chief Complaint   Chief Complaint  Patient presents with   Follow-up    Follow up after colonoscopy   History of Present Illness  Mary [name] is a 42 y.o. female with a history of anxiety/depression, bipolar 1, cervical cancer, carpal tunnel syndrome, partial hysterectomy, HTN, PTSD, rheumatoid arthritis, and COPD presenting today for follow-up of constipation and rectal bleeding.  Initial office visit 03/19/23.  Has been experiencing some fecal urgency which has resulted in a couple of accidents.  Reports alternating constipation with some looser stools.  Felt a knot in her rectal area assumed to be hemorrhoid. Taking miralax with Gatorade. Tried dulcolax and enemas as well as suppositories. Admits to straining. Had been on ozempic and mounjaro. Had been taken oxycodone and roxicodone.  Advised trial of Linzess 145 mcg daily, continue omeprazole daily.  Scheduled for colonoscopy per patient's request given recent rectal bleeding.  Colonoscopy 04/10/23: - Preparation of the colon was fair.  - Two 3 to 6 mm polyps in the rectum (tubular adenoma) - Non- bleeding internal hemorrhoids. - Repeat colonoscopy in 3 years (2 day prep) - Take Linzess 145 mcg daily.   Recently started on Myrbetriq by urology to help with overactive bladder and urinary urgency and incontinence. Due to follow-up with urology in [facility] for discussion of SUI surgery and PVR for overactive bladder.  Today:  GERD - Having worsening reflux at night. Getting weird sensation at back of her throat (near base of neck. Becoming more frequent. Having to stay elevated at night to breath better. Feels likes she is suffocating with her CPAP mask.   Constipation - Still having some days  where she can not make it to the bathroom and still having some days with constipation. Still having more constipation. Even if she does have to go she can't make it. Having combination urinary and fecal urgency. Going to have a cystoscopy soon for evaluation. Linzess helped some but not a whole lot.   No more rectal bleeding.  Has occasional abdominal pain, constantly tender throughout the day usually but worsening in the lower mid abdomen.   Has lost about 6 lbs since last visit. Has changed her diet and working on exercise. Consider rectal manometry if ongoing symptoms despite better improvement in constipation.   Does not want to be sent to multiple different doctors to help manage all of her conditions and not a specialist  for each little problem.   She states she was on Mounjaro in the past when she was going to [place]. Did not have constipation and issues until she was started on ozempic.   Wt Readings from Last 3 Encounters:  05/21/23 (!) 358 lb 12.8 oz (162.8 kg)  05/07/23 (!) 362 lb (164.2 kg)  04/06/23 (!) 364 lb (165.1 kg)    Current Outpatient Medications  Medication Sig Dispense Refill   albuterol (VENTOLIN HFA) 108 (90 Base) MCG/ACT inhaler Inhale 2 puffs into the lungs every 6 (six) hours as needed for wheezing or shortness of breath. 18 g 5   Atogepant (QULIPTA) 10 MG TABS Take 10 mg by mouth daily. 30 tablet 3   celecoxib (CELEBREX) 200 MG capsule Take 1 capsule (200 mg total) by mouth 2 (two) times daily. 60 capsule 0  Cholecalciferol (VITAMIN D3) 1000 units CAPS Take 3,000 Units by mouth daily.     citalopram (CELEXA) 20 MG tablet TAKE ONE TABLET ONCE DAILY 30 tablet 5   dapagliflozin propanediol (FARXIGA) 10 MG TABS tablet Take 1 tablet (10 mg total) by mouth daily before breakfast. 90 tablet 1   DULERA 100-5 MCG/ACT AERO INHALE 2 PUFFS TWICE DAILY 13 g 0   eszopiclone (LUNESTA) 1 MG TABS tablet Take 1 tablet (1 mg total) by mouth at bedtime as needed for  sleep. Take immediately before bedtime 30 tablet 0   fluticasone (FLONASE) 50 MCG/ACT nasal spray 1 SPRAY IN EACH NOSTRIL ONCE A DAY 16 g 0   hydrOXYzine (VISTARIL) 25 MG capsule TAKE ONE CAPSULE UP TO EVERY 8 HOURS AS NEEDED 60 capsule 0   ketoconazole (NIZORAL) 2 % cream Apply 1 Application topically daily. 15 g 0   levocetirizine (XYZAL) 5 MG tablet Take 1 tablet (5 mg total) by mouth every evening. 90 tablet 3   losartan (COZAAR) 100 MG tablet 100 mg one daily 30 tablet 4   melatonin 3 MG TABS tablet Take 10 mg by mouth at bedtime.     mirabegron ER (MYRBETRIQ) 25 MG TB24 tablet Take 2 tablets (50 mg total) by mouth daily. 60 tablet 2   omeprazole (PRILOSEC) 40 MG capsule Take 40 mg by mouth daily.     ondansetron (ZOFRAN-ODT) 4 MG disintegrating tablet TAKE ONE TABLET EVERY 8 HOURS AS NEEDED FOR NAUSEA AND VOMITING 20 tablet 0   pregabalin (LYRICA) 75 MG capsule One tid 90 capsule 2   torsemide (DEMADEX) 20 MG tablet TAKE ONE TABLET ONCE DAILY 30 tablet 0   No current facility-administered medications for this visit.    Past Medical History:  Diagnosis Date   Allergy    Anxiety    Asthma    Bipolar 1 disorder (HCC)    Bulging lumbar disc    BV (bacterial vaginosis) 02/18/2018   Cancer of cervix (HCC)    Carpal tunnel syndrome on right    Cervical cancer (HCC)    Chronic back pain    COPD (chronic obstructive pulmonary disease) (HCC)    Depression    Hypertension    Migraines    Physiological ovarian cysts    Plantar fasciitis    PTSD (post-traumatic stress disorder)    Rheumatoid arthritis (HCC)    S/P partial hysterectomy    Vaginal Pap smear, abnormal     Past Surgical History:  Procedure Laterality Date   ABDOMINAL HYSTERECTOMY     ABDOMINAL HYSTERECTOMY     COLONOSCOPY WITH PROPOFOL N/A 04/10/2023   Procedure: COLONOSCOPY WITH PROPOFOL;  Surgeon: Mary Frame, MD;  Location: [facility];  Service: Gastroenterology;  Laterality: N/A;  9:00 am, asa 3,  pt knows to arrive at 8:30   EYE SURGERY  age 53   Artificial right eye   ORIF ELBOW FRACTURE Left 11/18/2019   Procedure: OPEN REDUCTION WITH REPAIR VERSUS FRAGMENT EXCISION CAPITELLUM FRACTURE LEFT ELBOW AND LIGAMENTOUS RECONSTRUCTION AS NECESSARY;  Surgeon: Dominica Severin, MD;  Location: [facility];  Service: Orthopedics;  Laterality: Left;  2 HRS   POLYPECTOMY  04/10/2023   Procedure: POLYPECTOMY;  Surgeon: Mary Frame, MD;  Location: [facility];  Service: Gastroenterology;;    Family History  Problem Relation Age of Onset   Stroke Mother        TIA   Asthma Mother    Cancer Mother  leukemia   Other Mother        blood platelets are dropping   Heart attack Father    Hypertension Father    Cancer Father        skin cancer   Stroke Father    Colon polyps Father    Fibromyalgia Sister    Cancer Sister        cervical   Cancer Sister        cervical   Cancer Brother        thyroid cancer   Bipolar disorder Brother    Anxiety disorder Brother    Post-traumatic stress disorder Brother    Diabetes Maternal Grandmother    Heart Problems Maternal Grandmother    Anuerysm Maternal Grandfather    Seizures Maternal Grandfather    Cancer Paternal Grandmother        breast   Asthma Daughter    Sleep apnea Son    Cancer Maternal Aunt        breast cancer   Cancer Paternal Aunt    Colon cancer Maternal Uncle 69 - 79   Lung cancer Maternal Uncle     Allergies as of 05/21/2023 - Review Complete 05/21/2023  Allergen Reaction Noted   Buprenorphine hcl Anaphylaxis 09/03/2014   Morphine Anaphylaxis 07/06/2012   Morphine and codeine Anaphylaxis 05/10/2011   Penicillins Anaphylaxis and Other (See Comments) 11/23/2009   Gabapentin Hives and Other (See Comments) 10/07/2014   Nsaids  05/24/2021   Sulfa antibiotics  11/15/2021   Ibuprofen-acetaminophen Nausea And Vomiting 11/17/2019   Latex Rash 05/22/2015   Tylenol [acetaminophen] Nausea And Vomiting 11/17/2019     Social History   Socioeconomic History   Marital status: Single    Spouse name: Not on file   Number of children: 3   Years of education: 12   Highest education level: High school graduate  Occupational History   Occupation: Seeking disability  Tobacco Use   Smoking status: Never    Passive exposure: Never   Smokeless tobacco: Never  Vaping Use   Vaping status: Never Used  Substance and Sexual Activity   Alcohol use: No   Drug use: Not Currently    Types: Marijuana   Sexual activity: Not Currently    Birth control/protection: Surgical    Comment: hyst  Other Topics Concern   Not on file  Social History Narrative   Lives at home with father.   Right-handed.   No daily caffeine per day.   Social Determinants of Health   Financial Resource Strain: High Risk (03/06/2023)   Overall Financial Resource Strain (CARDIA)    Difficulty of Paying Living Expenses: Very hard  Food Insecurity: Food Insecurity Present (03/06/2023)   Hunger Vital Sign    Worried About Running Out of Food in the Last Year: Sometimes true    Ran Out of Food in the Last Year: Sometimes true  Transportation Needs: No Transportation Needs (03/06/2023)   PRAPARE - Administrator, Civil Service (Medical): No    Lack of Transportation (Non-Medical): No  Physical Activity: Sufficiently Active (03/06/2023)   Exercise Vital Sign    Days of Exercise per Week: 7 days    Minutes of Exercise per Session: 30 min  Stress: No Stress Concern Present (03/06/2023)   [employer] of Occupational Health - Occupational Stress Questionnaire    Feeling of Stress : Only a little  Recent Concern: Stress - Stress Concern Present (01/30/2023)   [employer] of Occupational Health -  Occupational Stress Questionnaire    Feeling of Stress : Very much  Social Connections: Moderately Isolated (03/06/2023)   Social Connection and Isolation Panel [NHANES]    Frequency of Communication with Friends and Family:  More than three times a week    Frequency of Social Gatherings with Friends and Family: Three times a week    Attends Religious Services: More than 4 times per year    Active Member of Clubs or Organizations: No    Attends Banker Meetings: Never    Marital Status: Divorced   Review of Systems   Gen: Denies fever, chills, anorexia. Denies fatigue, weakness, weight loss.  CV: Denies chest pain, palpitations, syncope, peripheral edema, and claudication. Resp: Denies dyspnea at rest, cough, wheezing, coughing up blood, and pleurisy. GI: See HPI Derm: Denies rash, itching, dry skin Psych: + Anxiety/depression, bipolar.  Denies memory loss, confusion. No homicidal or suicidal ideation.  Heme: Denies bruising, bleeding, and enlarged lymph nodes.  Physical Exam   BP 126/84   Pulse 74   Temp (!) 97.2 F (36.2 C)   Ht 5\' 9"  (1.753 m)   Wt (!) 358 lb 12.8 oz (162.8 kg)   BMI 52.99 kg/m   General:   Alert and oriented. No distress noted. Pleasant and cooperative.  Head:  Normocephalic and atraumatic. Abdomen:  +BS, soft, non-distended.  Generalized TTP throughout.  No rebound or guarding. No HSM or masses noted. Rectal: deferred Msk:  Symmetrical without gross deformities. Normal posture. Extremities:  Without edema. Neurologic:  Alert and  oriented x4 Psych:  Alert and cooperative. Normal mood and affect.   Assessment  Mary [name] is a 42 y.o. female with a history of anxiety/depression, bipolar 1, cervical cancer, carpal tunnel syndrome, partial hysterectomy, HTN, PTSD, rheumatoid arthritis, and COPD presenting today for follow-up of constipation and rectal bleeding.   Constipation, fecal urgency/leakage: Continues to suffer from some constipation.  Occasionally will have days of urgency with fecal leakage but no overt incontinence.  Has been having some stress urinary incontinence and overactive bladder.  Soon to have cystoscopy for further evaluation of this.   Linzess 145 mcg has helped some but has room for improvement.  Will increase Linzess to 290 mcg daily.  If helpful we will send a prescription.  Given her urinary incontinence as well as some fecal leakage/soilage she could have some pelvic floor dyssynergy/pelvic floor laxity therefore we discussed that given her lower abdominal pain we can evaluate with a repeat CT scan of the abdomen pelvis versus performing rectal manometry.   Rectal bleeding: Currently not having any rectal bleeding.  Hemorrhoids noted on recent colonoscopy 04/10/23.  GERD: Not currently well-controlled with omeprazole 40 mg once daily in the mornings.  Having worsening symptoms at night.  At times having mild globus sensation at the base of her neck and difficulty with breathing, having to sleep sitting up.  Has had about a 6 pound weight loss since last visit as she is working on diet and exercise.  Ongoing weight loss likely will continue to improve her reflux.  PLAN   Linzess 290 mcg, if helpful will send in prescription Continue omeprazole 40 mg, switch to nightly.  May consider increasing to twice daily if no improvement.  High fiber.  GERD diet CT A/P vs rectal manometry if no improvement with better bowel regularity.  Follow up 3 months.     Brooke Bonito, MSN, FNP-BC, AGACNP-BC [facility]

## 2023-05-21 ENCOUNTER — Encounter: Payer: Self-pay | Admitting: Gastroenterology

## 2023-05-21 ENCOUNTER — Ambulatory Visit (INDEPENDENT_AMBULATORY_CARE_PROVIDER_SITE_OTHER): Payer: 59 | Admitting: Psychiatry

## 2023-05-21 ENCOUNTER — Encounter (HOSPITAL_COMMUNITY): Payer: Self-pay

## 2023-05-21 ENCOUNTER — Ambulatory Visit (INDEPENDENT_AMBULATORY_CARE_PROVIDER_SITE_OTHER): Payer: 59 | Admitting: Gastroenterology

## 2023-05-21 VITALS — BP 126/84 | HR 74 | Temp 97.2°F | Ht 69.0 in | Wt 358.8 lb

## 2023-05-21 DIAGNOSIS — K625 Hemorrhage of anus and rectum: Secondary | ICD-10-CM

## 2023-05-21 DIAGNOSIS — F4310 Post-traumatic stress disorder, unspecified: Secondary | ICD-10-CM

## 2023-05-21 DIAGNOSIS — K581 Irritable bowel syndrome with constipation: Secondary | ICD-10-CM

## 2023-05-21 DIAGNOSIS — K5900 Constipation, unspecified: Secondary | ICD-10-CM

## 2023-05-21 DIAGNOSIS — R1310 Dysphagia, unspecified: Secondary | ICD-10-CM

## 2023-05-21 DIAGNOSIS — R152 Fecal urgency: Secondary | ICD-10-CM | POA: Diagnosis not present

## 2023-05-21 DIAGNOSIS — K219 Gastro-esophageal reflux disease without esophagitis: Secondary | ICD-10-CM | POA: Diagnosis not present
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
## 2023-05-21 NOTE — Patient Instructions (Addendum)
Trial Linzess 290 mcg daily.  Continue your omeprazole but switched to taking it 30 minutes prior to dinner.  Try this for 1-2 weeks and if you are not having breakthrough symptoms during the daytime then please increase your omeprazole to twice daily and let me know so I can update your prescription.  Also may help to increase fiber in your diet, this may help bulk up your stool some to avoid some of the fecal leakage.  Follow a GERD diet:  Avoid fried, fatty, greasy, spicy, citrus foods. Avoid caffeine and carbonated beverages. Avoid chocolate. Try eating 4-6 small meals a day rather than 3 large meals. Do not eat within 3 hours of laying down. Prop head of bed up on wood or bricks to create a 6 inch incline.  Follow-up in 3 months, sooner if needed.  It was a pleasure to see you today. I want to create trusting relationships with patients. If you receive a survey regarding your visit,  I greatly appreciate you taking time to fill this out on paper or through your MyChart. I value your feedback.  Brooke Bonito, MSN, FNP-BC, AGACNP-BC [facility]

## 2023-05-22 DIAGNOSIS — Z419 Encounter for procedure for purposes other than remedying health state, unspecified: Secondary | ICD-10-CM | POA: Diagnosis not present

## 2023-05-25 ENCOUNTER — Other Ambulatory Visit: Payer: Self-pay | Admitting: Family Medicine

## 2023-05-31 ENCOUNTER — Telehealth: Payer: Self-pay

## 2023-05-31 NOTE — Telephone Encounter (Signed)
Prior authorization key [id number] PENDING APPROVAL

## 2023-06-04 ENCOUNTER — Ambulatory Visit (INDEPENDENT_AMBULATORY_CARE_PROVIDER_SITE_OTHER): Payer: 59 | Admitting: Psychiatry

## 2023-06-04 DIAGNOSIS — F4310 Post-traumatic stress disorder, unspecified: Secondary | ICD-10-CM
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
## 2023-06-18 ENCOUNTER — Encounter: Payer: Self-pay | Admitting: Family Medicine

## 2023-06-18 ENCOUNTER — Other Ambulatory Visit: Payer: Self-pay

## 2023-06-18 ENCOUNTER — Ambulatory Visit: Payer: 59 | Admitting: Family Medicine

## 2023-06-18 VITALS — BP 150/100 | HR 65 | Resp 16 | Ht 69.0 in | Wt 357.0 lb

## 2023-06-18 DIAGNOSIS — R21 Rash and other nonspecific skin eruption: Secondary | ICD-10-CM | POA: Insufficient documentation

## 2023-06-18 DIAGNOSIS — I10 Essential (primary) hypertension: Secondary | ICD-10-CM | POA: Diagnosis not present

## 2023-06-18 DIAGNOSIS — G4709 Other insomnia: Secondary | ICD-10-CM | POA: Diagnosis not present

## 2023-06-18 DIAGNOSIS — Z970 Presence of artificial eye: Secondary | ICD-10-CM

## 2023-06-18 DIAGNOSIS — R739 Hyperglycemia, unspecified: Secondary | ICD-10-CM | POA: Diagnosis not present

## 2023-06-18 DIAGNOSIS — G894 Chronic pain syndrome: Secondary | ICD-10-CM

## 2023-06-18 DIAGNOSIS — E038 Other specified hypothyroidism: Secondary | ICD-10-CM | POA: Diagnosis not present

## 2023-06-18 DIAGNOSIS — F411 Generalized anxiety disorder: Secondary | ICD-10-CM

## 2023-06-18 DIAGNOSIS — E119 Type 2 diabetes mellitus without complications: Secondary | ICD-10-CM | POA: Diagnosis not present

## 2023-06-18 DIAGNOSIS — J301 Allergic rhinitis due to pollen: Secondary | ICD-10-CM

## 2023-06-18 DIAGNOSIS — E559 Vitamin D deficiency, unspecified: Secondary | ICD-10-CM | POA: Diagnosis not present

## 2023-06-18 DIAGNOSIS — Z1159 Encounter for screening for other viral diseases: Secondary | ICD-10-CM

## 2023-06-18 DIAGNOSIS — R110 Nausea: Secondary | ICD-10-CM

## 2023-06-18 DIAGNOSIS — M25472 Effusion, left ankle: Secondary | ICD-10-CM

## 2023-06-18 DIAGNOSIS — E7849 Other hyperlipidemia: Secondary | ICD-10-CM | POA: Diagnosis not present

## 2023-06-18 DIAGNOSIS — Z114 Encounter for screening for human immunodeficiency virus [HIV]: Secondary | ICD-10-CM

## 2023-06-18 MED ORDER — CITALOPRAM HYDROBROMIDE 20 MG PO TABS: 20.0000 mg | ORAL_TABLET | Freq: Every day | ORAL | 5 refills | Status: DC

## 2023-06-18 MED ORDER — ONDANSETRON 4 MG PO TBDP: ORAL_TABLET | ORAL | 0 refills | Status: DC

## 2023-06-18 MED ORDER — TORSEMIDE 20 MG PO TABS
20.0000 mg | ORAL_TABLET | Freq: Every day | ORAL | 2 refills | Status: DC
Start: 2023-06-18 — End: 2024-02-18

## 2023-06-18 MED ORDER — FLUTICASONE PROPIONATE 50 MCG/ACT NA SUSP
1.0000 | Freq: Every day | NASAL | 0 refills | Status: DC
Start: 2023-06-18 — End: 2024-03-13

## 2023-06-18 MED ORDER — HYDROXYZINE PAMOATE 25 MG PO CAPS
25.0000 mg | ORAL_CAPSULE | Freq: Three times a day (TID) | ORAL | 2 refills | Status: DC | PRN
Start: 2023-06-18 — End: 2024-01-17

## 2023-06-18 MED ORDER — PREGABALIN 75 MG PO CAPS: ORAL_CAPSULE | ORAL | 2 refills | Status: DC

## 2023-06-18 NOTE — Assessment & Plan Note (Addendum)
Referral placed to pain management for collaborative care management of the patient's pain Encouraged to continue taking Lyrica 75 mg 3 times daily

## 2023-06-18 NOTE — Assessment & Plan Note (Signed)
Encouraged to continue taking prescribed ketoconazole 2% cream

## 2023-06-18 NOTE — Assessment & Plan Note (Addendum)
Sleep Hygiene Reviewed Discussed sleep hygiene practices, including establishing a consistent bedtime routine, reducing daily stress, and optimizing the sleep environment for better rest. The patient is encouraged to continue taking over-the-counter melatonin as a sleep aid.

## 2023-06-18 NOTE — Assessment & Plan Note (Signed)
Pending hemoglobin A1c Encouraged to decrease her intake of high sugar foods and beverages increase physical activity

## 2023-06-18 NOTE — Assessment & Plan Note (Addendum)
Uncontrolled Blood Pressure: The patient is encouraged to resume therapy with torsemide 20 mg daily and will follow up in a week. If her blood pressure remains consistently above the target goal of less than 140/80, adjustments to her treatment regimen will be made. A low-sodium diet with increased physical activity is also recommended. BP Readings from Last 3 Encounters:  06/18/23 (!) 150/100  05/21/23 126/84  05/07/23 (!) 152/103

## 2023-06-18 NOTE — Assessment & Plan Note (Signed)
Right eye.  History of traumatic right eye loss at age 42

## 2023-06-18 NOTE — Patient Instructions (Addendum)
I appreciate the opportunity to provide care to you today!    Follow up:  1 week for BP   Labs: please stop by the lab today to get your blood drawn (CBC, CMP, TSH, Lipid profile, HgA1c, Vit D)  Screening: HIV and Hep C  Hypertension Management  Your current blood pressure is above the target goal of <140/90 mmHg. To address this, please continue taking torsemide 20 mg daily  Medication Instructions: Take your blood pressure medication at the same time each day. After taking your medication, check your blood pressure at least an hour later. If your first reading is >140/90 mmHg, wait at least 10 minutes and recheck your blood pressure. Diet and Lifestyle: Adhere to a low-sodium diet, limiting intake to less than 1500 mg daily, and increase your physical activity. Hydration and Nutrition: Stay well-hydrated by drinking at least 64 ounces of water daily. Increase your servings of fruits and vegetables and avoid excessive sodium in your diet. Long-Term Considerations: Uncontrolled hypertension can increase the risk of cardiovascular diseases, including stroke, coronary artery disease, and heart failure.  Please report to the emergency department if your blood pressure exceeds 180/120 and is accompanied by symptoms such as headaches, chest pain, palpitations, blurred vision, or dizziness.   -Continue taking melatonin over-the-counter Non-Pharmacological Management for Sleep Hygiene:  Establish a Consistent Bedtime Routine: -Develop and adhere to a regular sleep and wake schedule. -Avoid using electronic devices, including computers and smartphones, at least one hour before bedtime. -If unable to fall asleep within 15 minutes, refrain from staying in bed and engage in a relaxing activity until you feel sleepy. Reduce Daily Stress: -Engage in stress-reducing activities before bedtime to help relax your mind and body. Avoid intense physical exercise and stimulant use, such as caffeine, late in  the day. Optimize Sleep Environment: -Use the bed and bedroom exclusively for sleep and intimate activities. -Consider removing electronic devices from the sleeping area and limit screen time prior to bedtime. Incorporate Relaxation Techniques: -Practice abdominal breathing and meditation to promote relaxation. Utilize progressive muscle relaxation and visualization techniques to aid in achieving restful sleep.   Rash Please continue applying ketoconazole 2% cream to the affected site  Referral: pain clinic  Attached with your AVS, you will find valuable resources for self-education. I highly recommend dedicating some time to thoroughly examine them.   Please continue to a heart-healthy diet and increase your physical activities. Try to exercise for at least five days a week.    It was a pleasure to see you and I look forward to continuing to work together on your health and well-being. Please do not hesitate to call the office if you need care or have questions about your care.  In case of emergency, please visit the Emergency Department for urgent care, or contact our clinic at [phone] to schedule an appointment. We're here to help you!   Have a wonderful day and week. With Gratitude, Gilmore Laroche MSN, FNP-BC
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
## 2023-06-19 LAB — CMP14+EGFR
ALT: 22 [IU]/L (ref 0–32)
AST: 21 [IU]/L (ref 0–40)
Albumin: 4.4 g/dL (ref 3.9–4.9)
Alkaline Phosphatase: 123 [IU]/L — ABNORMAL HIGH (ref 44–121)
BUN/Creatinine Ratio: 12 (ref 9–23)
BUN: 11 mg/dL (ref 6–24)
Bilirubin Total: 0.6 mg/dL (ref 0.0–1.2)
CO2: 26 mmol/L (ref 20–29)
Calcium: 9.3 mg/dL (ref 8.7–10.2)
Chloride: 104 mmol/L (ref 96–106)
Creatinine, Ser: 0.91 mg/dL (ref 0.57–1.00)
Globulin, Total: 2.7 g/dL (ref 1.5–4.5)
Glucose: 87 mg/dL (ref 70–99)
Potassium: 4.2 mmol/L (ref 3.5–5.2)
Sodium: 142 mmol/L (ref 134–144)
Total Protein: 7.1 g/dL (ref 6.0–8.5)
eGFR: 80 mL/min/{1.73_m2} (ref >59)

## 2023-06-19 LAB — CBC WITH DIFFERENTIAL/PLATELET
Basophils Absolute: 0 10*3/uL (ref 0.0–0.2)
Basos: 0 %
EOS (ABSOLUTE): 0.1 10*3/uL (ref 0.0–0.4)
Eos: 1 %
Hematocrit: 38.8 % (ref 34.0–46.6)
Hemoglobin: 12.7 g/dL (ref 11.1–15.9)
Immature Grans (Abs): 0 10*3/uL (ref 0.0–0.1)
Immature Granulocytes: 0 %
Lymphocytes Absolute: 1.5 10*3/uL (ref 0.7–3.1)
Lymphs: 24 %
MCH: 28 pg (ref 26.6–33.0)
MCHC: 32.7 g/dL (ref 31.5–35.7)
MCV: 86 fL (ref 79–97)
Monocytes Absolute: 0.3 10*3/uL (ref 0.1–0.9)
Monocytes: 5 %
Neutrophils Absolute: 4.4 10*3/uL (ref 1.4–7.0)
Neutrophils: 70 %
Platelets: 214 10*3/uL (ref 150–450)
RBC: 4.53 x10E6/uL (ref 3.77–5.28)
RDW: 14.3 % (ref 11.7–15.4)
WBC: 6.3 10*3/uL (ref 3.4–10.8)

## 2023-06-19 LAB — LIPID PANEL
Chol/HDL Ratio: 3 ratio (ref 0.0–4.4)
Cholesterol, Total: 167 mg/dL (ref 100–199)
HDL: 55 mg/dL (ref >39)
LDL Chol Calc (NIH): 99 mg/dL (ref 0–99)
Triglycerides: 66 mg/dL (ref 0–149)
VLDL Cholesterol Cal: 13 mg/dL (ref 5–40)

## 2023-06-19 LAB — HEMOGLOBIN A1C
Est. average glucose Bld gHb Est-mCnc: 103 mg/dL
Hgb A1c MFr Bld: 5.2 % (ref 4.8–5.6)

## 2023-06-19 LAB — VITAMIN D 25 HYDROXY (VIT D DEFICIENCY, FRACTURES): Vit D, 25-Hydroxy: 23.8 ng/mL — ABNORMAL LOW (ref 30.0–100.0)

## 2023-06-19 LAB — TSH+FREE T4
Free T4: 1.27 ng/dL (ref 0.82–1.77)
TSH: 1.27 u[IU]/mL (ref 0.450–4.500)

## 2023-06-19 LAB — HIV ANTIBODY (ROUTINE TESTING W REFLEX): HIV Screen 4th Generation wRfx: NONREACTIVE

## 2023-06-19 LAB — HEPATITIS C ANTIBODY: Hep C Virus Ab: NONREACTIVE

## 2023-06-22 DIAGNOSIS — Z419 Encounter for procedure for purposes other than remedying health state, unspecified: Secondary | ICD-10-CM | POA: Diagnosis not present

## 2023-06-25 ENCOUNTER — Ambulatory Visit (INDEPENDENT_AMBULATORY_CARE_PROVIDER_SITE_OTHER): Payer: 59 | Admitting: Family Medicine

## 2023-06-25 ENCOUNTER — Encounter: Payer: Self-pay | Admitting: Family Medicine

## 2023-06-25 VITALS — BP 136/74 | HR 68 | Ht 69.0 in | Wt 357.1 lb

## 2023-06-25 DIAGNOSIS — Z23 Encounter for immunization: Secondary | ICD-10-CM

## 2023-06-25 DIAGNOSIS — I10 Essential (primary) hypertension: Secondary | ICD-10-CM | POA: Diagnosis not present

## 2023-06-25 DIAGNOSIS — E559 Vitamin D deficiency, unspecified: Secondary | ICD-10-CM

## 2023-06-25 MED ORDER — WEGOVY 0.25 MG/0.5ML ~~LOC~~ SOAJ
0.2500 mg | SUBCUTANEOUS | 0 refills | Status: DC
Start: 2023-06-25 — End: 2023-10-18

## 2023-06-25 MED ORDER — VITAMIN D (ERGOCALCIFEROL) 1.25 MG (50000 UNIT) PO CAPS: 50000.0000 [IU] | ORAL_CAPSULE | ORAL | 1 refills | Status: DC

## 2023-06-25 NOTE — Patient Instructions (Addendum)
I appreciate the opportunity to provide care to you today!    Follow up:  3 months  -Start wegovy 0.25 mg  weekly Recommendations for Weight Loss Management:  Emphasize Lifestyle Changes: A heart-healthy diet and increased physical activity are crucial. Healthy Tips for Weight Loss: Increase Intake of Nutrient-Rich Foods: Prioritize fruits, vegetables, and whole grains. Incorporate Lean Proteins: Include chicken, fish, beans, and legumes in your diet. Choose Low-Fat Dairy Products: Opt for dairy products that are low in fat. Reduce Unhealthy Fats: Limit saturated fats, trans fatty acids, and cholesterol. Aim for Regular Physical Activity: Engage in at least 30 minutes of brisk walking or other physical activities on at least 5 days a week.    Attached with your AVS, you will find valuable resources for self-education. I highly recommend dedicating some time to thoroughly examine them.   Please continue to a heart-healthy diet and increase your physical activities. Try to exercise for at least five days a week.    It was a pleasure to see you and I look forward to continuing to work together on your health and well-being. Please do not hesitate to call the office if you need care or have questions about your care.  In case of emergency, please visit the Emergency Department for urgent care, or contact our clinic at [phone] to schedule an appointment. We're here to help you!   Have a wonderful day and week. With Gratitude, Gilmore Laroche MSN, FNP-BC

## 2023-06-25 NOTE — Progress Notes (Unsigned)
Established Patient Office Visit  Subjective:  Patient ID: Mary [name], female    DOB: [birth year]  Age: 42 y.o. MRN: [id number]  CC:  Chief Complaint  Patient presents with   Hypertension    Follow up on bp.     HPI Mary [name] is a 42 y.o. female presents for BP f/u of  chronic medical conditions. For the details of today's visit, please refer to the assessment and plan.    Past Medical History:  Diagnosis Date   Allergy    Anxiety    Asthma    Bipolar 1 disorder (HCC)    Bulging lumbar disc    BV (bacterial vaginosis) 02/18/2018   Cancer of cervix (HCC)    Carpal tunnel syndrome on right    Cervical cancer (HCC)    Chronic back pain    COPD (chronic obstructive pulmonary disease) (HCC)    Depression    Hypertension    Migraines    Physiological ovarian cysts    Plantar fasciitis    PTSD (post-traumatic stress disorder)    Rheumatoid arthritis (HCC)    S/P partial hysterectomy    Vaginal Pap smear, abnormal     Past Surgical History:  Procedure Laterality Date   ABDOMINAL HYSTERECTOMY     ABDOMINAL HYSTERECTOMY     COLONOSCOPY WITH PROPOFOL N/A 04/10/2023   Procedure: COLONOSCOPY WITH PROPOFOL;  Surgeon: Dolores Frame, MD;  [facility]: [facility];  Service: Gastroenterology;  Laterality: N/A;  9:00 am, asa 3, pt knows to arrive at 8:30   EYE SURGERY  age 58   Artificial right eye   ORIF ELBOW FRACTURE Left 11/18/2019   Procedure: OPEN REDUCTION WITH REPAIR VERSUS FRAGMENT EXCISION CAPITELLUM FRACTURE LEFT ELBOW AND LIGAMENTOUS RECONSTRUCTION AS NECESSARY;  Surgeon: Dominica Severin, MD;  Location: [facility];  Service: Orthopedics;  Laterality: Left;  2 HRS   POLYPECTOMY  04/10/2023   Procedure: POLYPECTOMY;  Surgeon: Marguerita Merles, Reuel Boom, MD;  Location: [facility];  Service: Gastroenterology;;    Family History  Problem Relation Age of Onset   Stroke Mother        TIA   Asthma Mother    Cancer Mother        leukemia   Other  Mother        blood platelets are dropping   Heart attack Father    Hypertension Father    Cancer Father        skin cancer   Stroke Father    Colon polyps Father    Fibromyalgia Sister    Cancer Sister        cervical   Cancer Sister        cervical   Cancer Brother        thyroid cancer   Bipolar disorder Brother    Anxiety disorder Brother    Post-traumatic stress disorder Brother    Diabetes Maternal Grandmother    Heart Problems Maternal Grandmother    Anuerysm Maternal Grandfather    Seizures Maternal Grandfather    Cancer Paternal Grandmother        breast   Asthma Daughter    Sleep apnea Son    Cancer Maternal Aunt        breast cancer   Cancer Paternal Aunt    Colon cancer Maternal Uncle 58 - 79   Lung cancer Maternal Uncle     Social History   Socioeconomic History   Marital status: Single  Spouse name: Not on file   Number of children: 3   Years of education: 57   Highest education level: High school graduate  Occupational History   Occupation: Seeking disability  Tobacco Use   Smoking status: Never    Passive exposure: Never   Smokeless tobacco: Never  Vaping Use   Vaping status: Never Used  Substance and Sexual Activity   Alcohol use: No   Drug use: Not Currently    Types: Marijuana   Sexual activity: Not Currently    Birth control/protection: Surgical    Comment: hyst  Other Topics Concern   Not on file  Social History Narrative   Lives at home with father.   Right-handed.   No daily caffeine per day.   Social Determinants of Health   Financial Resource Strain: High Risk (03/06/2023)   Overall Financial Resource Strain (CARDIA)    Difficulty of Paying Living Expenses: Very hard  Food Insecurity: Food Insecurity Present (03/06/2023)   Hunger Vital Sign    Worried About Running Out of Food in the Last Year: Sometimes true    Ran Out of Food in the Last Year: Sometimes true  Transportation Needs: No Transportation Needs (03/06/2023)    PRAPARE - Administrator, Civil Service (Medical): No    Lack of Transportation (Non-Medical): No  Physical Activity: Sufficiently Active (03/06/2023)   Exercise Vital Sign    Days of Exercise per Week: 7 days    Minutes of Exercise per Session: 30 min  Stress: No Stress Concern Present (03/06/2023)   [employer] of Occupational Health - Occupational Stress Questionnaire    Feeling of Stress : Only a little  Recent Concern: Stress - Stress Concern Present (01/30/2023)   [employer] of Occupational Health - Occupational Stress Questionnaire    Feeling of Stress : Very much  Social Connections: Moderately Isolated (03/06/2023)   Social Connection and Isolation Panel [NHANES]    Frequency of Communication with Friends and Family: More than three times a week    Frequency of Social Gatherings with Friends and Family: Three times a week    Attends Religious Services: More than 4 times per year    Active Member of Clubs or Organizations: No    Attends Banker Meetings: Never    Marital Status: Divorced  Catering manager Violence: Not At Risk (03/06/2023)   Humiliation, Afraid, Rape, and Kick questionnaire    Fear of Current or Ex-Partner: No    Emotionally Abused: No    Physically Abused: No    Sexually Abused: No    Outpatient Medications Prior to Visit  Medication Sig Dispense Refill   albuterol (VENTOLIN HFA) 108 (90 Base) MCG/ACT inhaler Inhale 2 puffs into the lungs every 6 (six) hours as needed for wheezing or shortness of breath. 18 g 5   Atogepant (QULIPTA) 10 MG TABS Take 10 mg by mouth daily. 30 tablet 3   celecoxib (CELEBREX) 200 MG capsule Take 1 capsule (200 mg total) by mouth 2 (two) times daily. 60 capsule 0   Cholecalciferol (VITAMIN D3) 1000 units CAPS Take 3,000 Units by mouth daily.     citalopram (CELEXA) 20 MG tablet Take 1 tablet (20 mg total) by mouth daily. For depression 30 tablet 5   dapagliflozin propanediol (FARXIGA) 10 MG TABS  tablet Take 1 tablet (10 mg total) by mouth daily before breakfast. 90 tablet 1   DULERA 100-5 MCG/ACT AERO INHALE 2 PUFFS TWICE DAILY 13 g 0  eszopiclone (LUNESTA) 1 MG TABS tablet Take 1 tablet (1 mg total) by mouth at bedtime as needed for sleep. Take immediately before bedtime 30 tablet 0   fluticasone (FLONASE) 50 MCG/ACT nasal spray Place 1 spray into both nostrils daily. For allergies 16 g 0   hydrOXYzine (VISTARIL) 25 MG capsule Take 1 capsule (25 mg total) by mouth every 8 (eight) hours as needed. For anxiety 60 capsule 2   ketoconazole (NIZORAL) 2 % cream Apply 1 Application topically daily. 15 g 0   levocetirizine (XYZAL) 5 MG tablet Take 1 tablet (5 mg total) by mouth every evening. 90 tablet 3   losartan (COZAAR) 100 MG tablet 100 mg one daily 30 tablet 4   melatonin 3 MG TABS tablet Take 10 mg by mouth at bedtime.     mirabegron ER (MYRBETRIQ) 25 MG TB24 tablet Take 2 tablets (50 mg total) by mouth daily. 60 tablet 2   omeprazole (PRILOSEC) 40 MG capsule Take 40 mg by mouth daily.     ondansetron (ZOFRAN-ODT) 4 MG disintegrating tablet TAKE ONE TABLET EVERY 8 HOURS AS NEEDED FOR NAUSEA AND VOMITING 20 tablet 0   pregabalin (LYRICA) 75 MG capsule TAKE ONE CAPSULE THREE TIMES DAILY for nerve pain 90 capsule 2   torsemide (DEMADEX) 20 MG tablet Take 1 tablet (20 mg total) by mouth daily. For swelling 30 tablet 2   No facility-administered medications prior to visit.    Allergies  Allergen Reactions   Buprenorphine Hcl Anaphylaxis    "it stops my heart"   Morphine Anaphylaxis    i quit breathing    Morphine And Codeine Anaphylaxis    "it stops my heart"   Penicillins Anaphylaxis and Other (See Comments)    Has patient had a PCN reaction causing immediate rash, facial/tongue/throat swelling, SOB or lightheadedness with hypotension: Yes Has patient had a PCN reaction causing severe rash involving mucus membranes or skin necrosis: Yes Has patient had a PCN reaction that required  hospitalization: Yes Has patient had a PCN reaction occurring within the last 10 years: No If all of the above answers are "NO", then may proceed with Cephalosporin use.    Gabapentin Hives and Other (See Comments)    Per patient "hallucination"   Nsaids    Sulfa Antibiotics     Other reaction(s): Unknown   Ibuprofen-Acetaminophen Nausea And Vomiting   Latex Rash   Tylenol [Acetaminophen] Nausea And Vomiting    ROS Review of Systems  Constitutional:  Negative for chills and fever.  Eyes:  Negative for visual disturbance.  Respiratory:  Negative for chest tightness and shortness of breath.   Neurological:  Negative for dizziness and headaches.      Objective:    Physical Exam Constitutional:      Appearance: She is obese.  HENT:     Head: Normocephalic.     Mouth/Throat:     Mouth: Mucous membranes are moist.  Cardiovascular:     Rate and Rhythm: Normal rate.     Heart sounds: Normal heart sounds.  Pulmonary:     Effort: Pulmonary effort is normal.     Breath sounds: Normal breath sounds.  Neurological:     Mental Status: She is alert.     BP 136/74   Pulse 68   Ht 5\' 9"  (1.753 m)   Wt (!) 357 lb 1.9 oz (162 kg)   SpO2 95%   BMI 52.74 kg/m  Wt Readings from Last 3 Encounters:  06/25/23 (!) 357  lb 1.9 oz (162 kg)  06/18/23 (!) 357 lb (161.9 kg)  05/21/23 (!) 358 lb 12.8 oz (162.8 kg)    Lab Results  Component Value Date   TSH 1.270 06/18/2023   Lab Results  Component Value Date   WBC 6.3 06/18/2023   HGB 12.7 06/18/2023   HCT 38.8 06/18/2023   MCV 86 06/18/2023   PLT 214 06/18/2023   Lab Results  Component Value Date   NA 142 06/18/2023   K 4.2 06/18/2023   CO2 26 06/18/2023   GLUCOSE 87 06/18/2023   BUN 11 06/18/2023   CREATININE 0.91 06/18/2023   BILITOT 0.6 06/18/2023   ALKPHOS 123 (H) 06/18/2023   AST 21 06/18/2023   ALT 22 06/18/2023   PROT 7.1 06/18/2023   ALBUMIN 4.4 06/18/2023   CALCIUM 9.3 06/18/2023   ANIONGAP 15 04/06/2023    EGFR 80 06/18/2023   Lab Results  Component Value Date   CHOL 167 06/18/2023   Lab Results  Component Value Date   HDL 55 06/18/2023   Lab Results  Component Value Date   LDLCALC 99 06/18/2023   Lab Results  Component Value Date   TRIG 66 06/18/2023   Lab Results  Component Value Date   CHOLHDL 3.0 06/18/2023   Lab Results  Component Value Date   HGBA1C 5.2 06/18/2023      Assessment & Plan:  Primary hypertension Assessment & Plan: Controlled Blood Pressure Asymptomatic in the clinic Encouraged to continue torsemide 20 mg daily  A low-sodium diet with increased physical activity is also recommended. BP Readings from Last 3 Encounters:  06/25/23 136/74  06/18/23 (!) 150/100  05/21/23 126/84        Morbid obesity (HCC) Assessment & Plan: The patient reports that she has been implementing lifestyle changes for the past 3 months with minimal change in her weight. She feels discouraged about her weight loss efforts and would like to discuss additional options.  We will start the patient today on Wegovy and have reviewed the potential side effects of the medication. She is encouraged to continue emphasizing lifestyle changes alongside the medication for optimal results  Wt Readings from Last 3 Encounters:  06/25/23 (!) 357 lb 1.9 oz (162 kg)  06/18/23 (!) 357 lb (161.9 kg)  05/21/23 (!) 358 lb 12.8 oz (162.8 kg)      Orders: -     ZOXWRU; Inject 0.25 mg into the skin once a week.  Dispense: 2 mL; Refill: 0  Encounter for immunization Assessment & Plan: Patient educated on CDC recommendation for the vaccine. Verbal consent was obtained from the patient, vaccine administered by nurse, no sign of adverse reactions noted at this time. Patient education on arm soreness and use of tylenol or ibuprofen for this patient  was discussed. Patient educated on the signs and symptoms of adverse effect and advise to contact the office if they occur.   Orders: -     Flu  vaccine trivalent PF, 6mos and older(Flulaval,Afluria,Fluarix,Fluzone)  Vitamin D deficiency Assessment & Plan: Refill sent  Orders: -     Vitamin D (Ergocalciferol); Take 1 capsule (50,000 Units total) by mouth every 7 (seven) days.  Dispense: 20 capsule; Refill: 1  Note: This chart has been completed using Engineer, civil (consulting) software, and while attempts have been made to ensure accuracy, certain words and phrases may not be transcribed as intended.    Follow-up: No follow-ups on file.   Gilmore Laroche, FNP

## 2023-06-28 DIAGNOSIS — Z23 Encounter for immunization: Secondary | ICD-10-CM | POA: Insufficient documentation

## 2023-06-28 NOTE — Assessment & Plan Note (Signed)
Patient educated on CDC recommendation for the vaccine. Verbal consent was obtained from the patient, vaccine administered by nurse, no sign of adverse reactions noted at this time. Patient education on arm soreness and use of tylenol or ibuprofen for this patient  was discussed. Patient educated on the signs and symptoms of adverse effect and advise to contact the office if they occur.  

## 2023-06-28 NOTE — Assessment & Plan Note (Signed)
Refill sent.

## 2023-06-28 NOTE — Assessment & Plan Note (Addendum)
The patient reports that she has been implementing lifestyle changes for the past 3 months with minimal change in her weight. She feels discouraged about her weight loss efforts and would like to discuss additional options.  We will start the patient today on Wegovy and have reviewed the potential side effects of the medication. She is encouraged to continue emphasizing lifestyle changes alongside the medication for optimal results  Wt Readings from Last 3 Encounters:  06/25/23 (!) 357 lb 1.9 oz (162 kg)  06/18/23 (!) 357 lb (161.9 kg)  05/21/23 (!) 358 lb 12.8 oz (162.8 kg)

## 2023-06-28 NOTE — Assessment & Plan Note (Signed)
Controlled Blood Pressure Asymptomatic in the clinic Encouraged to continue torsemide 20 mg daily  A low-sodium diet with increased physical activity is also recommended. BP Readings from Last 3 Encounters:  06/25/23 136/74  06/18/23 (!) 150/100  05/21/23 126/84

## 2023-07-09 ENCOUNTER — Ambulatory Visit (INDEPENDENT_AMBULATORY_CARE_PROVIDER_SITE_OTHER): Payer: 59 | Admitting: Urology

## 2023-07-09 ENCOUNTER — Encounter: Payer: Self-pay | Admitting: Urology

## 2023-07-09 VITALS — BP 163/114 | HR 68 | Ht 69.0 in | Wt 359.0 lb

## 2023-07-09 DIAGNOSIS — N3281 Overactive bladder: Secondary | ICD-10-CM | POA: Diagnosis not present

## 2023-07-09 DIAGNOSIS — N3946 Mixed incontinence: Secondary | ICD-10-CM | POA: Diagnosis not present

## 2023-07-09 LAB — URINALYSIS, COMPLETE
Bilirubin, UA: NEGATIVE
Glucose, UA: NEGATIVE
Ketones, UA: NEGATIVE
Leukocytes,UA: NEGATIVE
Nitrite, UA: NEGATIVE
Protein,UA: NEGATIVE
RBC, UA: NEGATIVE
Specific Gravity, UA: 1.025 (ref 1.005–1.030)
Urobilinogen, Ur: 0.2 mg/dL (ref 0.2–1.0)
pH, UA: 6 (ref 5.0–7.5)

## 2023-07-09 LAB — MICROSCOPIC EXAMINATION

## 2023-07-09 MED ORDER — OXYBUTYNIN CHLORIDE ER 10 MG PO TB24: 10.0000 mg | ORAL_TABLET | Freq: Every day | ORAL | 11 refills | Status: AC

## 2023-07-09 NOTE — Progress Notes (Signed)
07/09/2023 9:29 AM   Mary [name] 08-23-1980 [id number]  Referring provider: [name], DO [address] [place],  [place] [address]  Chief Complaint  Patient presents with   Cysto    HPI: I was consulted to assess the patient's urinary incontinence.  She leaks with coughing sneezing bending lifting.  She has urge incontinence.  Sometimes she has bedwetting.  She wears 2-3 pads a day moderately wet.  Both components are significant   She voids every 2 hours and is up to 6 times a night.  She has significant ankle edema but does take a diuretic   She recently failed Gemtesa   She has had a hysterectomy    Patient has mixed incontinence. She has intermittent bedwetting and milder frequency and significant nocturia. She likely has a nocturnal diuresis role of urodynamics and cystoscopy discussed. We will proceed accordingly. I would not be surprised that she primarily has an overactive bladder   Today Patient's incontinence is improved but she still wears 2 pads a day mildly wet.  She leaks a small amount with coughing and sneezing.  She still has urge incontinence.  She is only leak twice while she is asleep  On pelvic examination she grade 2 hypermobility the bladder neck and negative cough test with a strong cough after cystoscopy.  No prolapse.  She is moderately obese  Cystoscopy: Patient underwent flexible cystoscopy.  Bladder mucosa and trigone were normal.  No cystitis.  No carcinoma.  Few white flecks in urine and sent for culture.    PMH: Past Medical History:  Diagnosis Date   Allergy    Anxiety    Asthma    Bipolar 1 disorder (HCC)    Bulging lumbar disc    BV (bacterial vaginosis) 02/18/2018   Cancer of cervix (HCC)    Carpal tunnel syndrome on right    Cervical cancer (HCC)    Chronic back pain    COPD (chronic obstructive pulmonary disease) (HCC)    Depression    Hypertension    Migraines    Physiological ovarian cysts    Plantar  fasciitis    PTSD (post-traumatic stress disorder)    Rheumatoid arthritis (HCC)    S/P partial hysterectomy    Vaginal Pap smear, abnormal     Surgical History: Past Surgical History:  Procedure Laterality Date   ABDOMINAL HYSTERECTOMY     ABDOMINAL HYSTERECTOMY     COLONOSCOPY WITH PROPOFOL N/A 04/10/2023   Procedure: COLONOSCOPY WITH PROPOFOL;  Surgeon: Dolores Frame, MD;  [facility];  Service: Gastroenterology;  Laterality: N/A;  9:00 am, asa 3, pt knows to arrive at 8:30   EYE SURGERY  age 23   Artificial right eye   ORIF ELBOW FRACTURE Left 11/18/2019   Procedure: OPEN REDUCTION WITH REPAIR VERSUS FRAGMENT EXCISION CAPITELLUM FRACTURE LEFT ELBOW AND LIGAMENTOUS RECONSTRUCTION AS NECESSARY;  Surgeon: Dominica Severin, MD;  Location: [facility];  Service: Orthopedics;  Laterality: Left;  2 HRS   POLYPECTOMY  04/10/2023   Procedure: POLYPECTOMY;  Surgeon: Dolores Frame, MD;  [facility];  Service: Gastroenterology;;    Home Medications:  Allergies as of 07/09/2023       Reactions   Buprenorphine Hcl Anaphylaxis   "it stops my heart"   Morphine Anaphylaxis   i quit breathing    Morphine And Codeine Anaphylaxis   "it stops my heart"   Penicillins Anaphylaxis, Other (See Comments)   Has patient had  a PCN reaction causing immediate rash, facial/tongue/throat swelling, SOB or lightheadedness with hypotension: Yes Has patient had a PCN reaction causing severe rash involving mucus membranes or skin necrosis: Yes Has patient had a PCN reaction that required hospitalization: Yes Has patient had a PCN reaction occurring within the last 10 years: No If all of the above answers are "NO", then may proceed with Cephalosporin use.   Gabapentin Hives, Other (See Comments)   Per patient "hallucination"   Nsaids    Sulfa Antibiotics    Other reaction(s): Unknown   Ibuprofen-acetaminophen Nausea And Vomiting   Latex Rash   Tylenol [acetaminophen]  Nausea And Vomiting        Medication List        Accurate as of July 09, 2023  9:29 AM. If you have any questions, ask your nurse or doctor.          albuterol 108 (90 Base) MCG/ACT inhaler Commonly known as: VENTOLIN HFA Inhale 2 puffs into the lungs every 6 (six) hours as needed for wheezing or shortness of breath.   celecoxib 200 MG capsule Commonly known as: CeleBREX Take 1 capsule (200 mg total) by mouth 2 (two) times daily.   citalopram 20 MG tablet Commonly known as: CELEXA Take 1 tablet (20 mg total) by mouth daily. For depression   dapagliflozin propanediol 10 MG Tabs tablet Commonly known as: Farxiga Take 1 tablet (10 mg total) by mouth daily before breakfast.   Dulera 100-5 MCG/ACT Aero Generic drug: mometasone-formoterol INHALE 2 PUFFS TWICE DAILY   eszopiclone 1 MG Tabs tablet Commonly known as: LUNESTA Take 1 tablet (1 mg total) by mouth at bedtime as needed for sleep. Take immediately before bedtime   fluticasone 50 MCG/ACT nasal spray Commonly known as: FLONASE Place 1 spray into both nostrils daily. For allergies   hydrOXYzine 25 MG capsule Commonly known as: VISTARIL Take 1 capsule (25 mg total) by mouth every 8 (eight) hours as needed. For anxiety   ketoconazole 2 % cream Commonly known as: NIZORAL Apply 1 Application topically daily.   levocetirizine 5 MG tablet Commonly known as: XYZAL Take 1 tablet (5 mg total) by mouth every evening.   losartan 100 MG tablet Commonly known as: COZAAR 100 mg one daily   melatonin 3 MG Tabs tablet Take 10 mg by mouth at bedtime.   mirabegron ER 25 MG Tb24 tablet Commonly known as: MYRBETRIQ Take 2 tablets (50 mg total) by mouth daily.   omeprazole 40 MG capsule Commonly known as: PRILOSEC Take 40 mg by mouth daily.   ondansetron 4 MG disintegrating tablet Commonly known as: ZOFRAN-ODT TAKE ONE TABLET EVERY 8 HOURS AS NEEDED FOR NAUSEA AND VOMITING   pregabalin 75 MG capsule Commonly  known as: LYRICA TAKE ONE CAPSULE THREE TIMES DAILY for nerve pain   Qulipta 10 MG Tabs Generic drug: Atogepant Take 10 mg by mouth daily.   torsemide 20 MG tablet Commonly known as: DEMADEX Take 1 tablet (20 mg total) by mouth daily. For swelling   Vitamin D (Ergocalciferol) 1.25 MG (50000 UNIT) Caps capsule Commonly known as: DRISDOL Take 1 capsule (50,000 Units total) by mouth every 7 (seven) days.   Vitamin D3 1000 units Caps Take 3,000 Units by mouth daily.   Wegovy 0.25 MG/0.5ML Soaj Generic drug: Semaglutide-Weight Management Inject 0.25 mg into the skin once a week.        Allergies:  Allergies  Allergen Reactions   Buprenorphine Hcl Anaphylaxis    "it stops  my heart"   Morphine Anaphylaxis    i quit breathing    Morphine And Codeine Anaphylaxis    "it stops my heart"   Penicillins Anaphylaxis and Other (See Comments)    Has patient had a PCN reaction causing immediate rash, facial/tongue/throat swelling, SOB or lightheadedness with hypotension: Yes Has patient had a PCN reaction causing severe rash involving mucus membranes or skin necrosis: Yes Has patient had a PCN reaction that required hospitalization: Yes Has patient had a PCN reaction occurring within the last 10 years: No If all of the above answers are "NO", then may proceed with Cephalosporin use.    Gabapentin Hives and Other (See Comments)    Per patient "hallucination"   Nsaids    Sulfa Antibiotics     Other reaction(s): Unknown   Ibuprofen-Acetaminophen Nausea And Vomiting   Latex Rash   Tylenol [Acetaminophen] Nausea And Vomiting    Family History: Family History  Problem Relation Age of Onset   Stroke Mother        TIA   Asthma Mother    Cancer Mother        leukemia   Other Mother        blood platelets are dropping   Heart attack Father    Hypertension Father    Cancer Father        skin cancer   Stroke Father    Colon polyps Father    Fibromyalgia Sister    Cancer Sister         cervical   Cancer Sister        cervical   Cancer Brother        thyroid cancer   Bipolar disorder Brother    Anxiety disorder Brother    Post-traumatic stress disorder Brother    Diabetes Maternal Grandmother    Heart Problems Maternal Grandmother    Anuerysm Maternal Grandfather    Seizures Maternal Grandfather    Cancer Paternal Grandmother        breast   Asthma Daughter    Sleep apnea Son    Cancer Maternal Aunt        breast cancer   Cancer Paternal Aunt    Colon cancer Maternal Uncle 70 - 79   Lung cancer Maternal Uncle     Social History:  reports that she has never smoked. She has never been exposed to tobacco smoke. She has never used smokeless tobacco. She reports that she does not currently use drugs after having used the following drugs: Marijuana. She reports that she does not drink alcohol.  ROS:                                        Physical Exam: There were no vitals taken for this visit.  Constitutional:  Alert and oriented, No acute distress. HEENT:  AT, moist mucus membranes.  Trachea midline, no masses.   Laboratory Data: Lab Results  Component Value Date   WBC 6.3 06/18/2023   HGB 12.7 06/18/2023   HCT 38.8 06/18/2023   MCV 86 06/18/2023   PLT 214 06/18/2023    Lab Results  Component Value Date   CREATININE 0.91 06/18/2023    No results found for: "PSA"  No results found for: "TESTOSTERONE"  Lab Results  Component Value Date   HGBA1C 5.2 06/18/2023    Urinalysis    Component Value Date/Time  COLORURINE YELLOW (A) 01/05/2020 1634   APPEARANCEUR Hazy (A) 05/07/2023 0945   LABSPEC 1.020 01/05/2020 1634   LABSPEC 1.005 09/15/2013 1913   PHURINE 7.0 01/05/2020 1634   GLUCOSEU Negative 05/07/2023 0945   GLUCOSEU Negative 09/15/2013 1913   HGBUR NEGATIVE 01/05/2020 1634   BILIRUBINUR Negative 05/07/2023 0945   BILIRUBINUR Negative 09/15/2013 1913   KETONESUR NEGATIVE 01/05/2020 1634   PROTEINUR  Negative 05/07/2023 0945   PROTEINUR NEGATIVE 01/05/2020 1634   UROBILINOGEN 0.2 09/03/2014 1935   NITRITE Negative 05/07/2023 0945   NITRITE NEGATIVE 01/05/2020 1634   LEUKOCYTESUR Negative 05/07/2023 0945   LEUKOCYTESUR LARGE (A) 01/05/2020 1634   LEUKOCYTESUR Negative 09/15/2013 1913    Pertinent Imaging: Urine negative but sent for culture  Assessment & Plan: Patient has mixed incontinence but likely primarily overactive bladder.  For insurance reason she would need urodynamics in [facility].  Role of medical and behavioral therapy discussed.  Patient is having urodynamics December 10 and she will try oxybutynin ER 10 mg 3 x 11.  I did not have Gemtesa samples today.  I thought it was fine to do the urodynamics on oxybutynin.  1. Mixed stress and urge urinary incontinence  - Urinalysis, Complete   No follow-ups on file.  Martina Sinner, MD  [facility] [address] [place], [place] [address] [phone]

## 2023-07-10 ENCOUNTER — Ambulatory Visit: Payer: Self-pay | Admitting: Family Medicine

## 2023-07-10 ENCOUNTER — Other Ambulatory Visit: Payer: Self-pay

## 2023-07-10 ENCOUNTER — Emergency Department (HOSPITAL_COMMUNITY)
Admission: EM | Admit: 2023-07-10 | Discharge: 2023-07-10 | Disposition: A | Discharge status: Home / Self Care | Payer: 59

## 2023-07-10 ENCOUNTER — Emergency Department (HOSPITAL_COMMUNITY): Payer: 59

## 2023-07-10 ENCOUNTER — Encounter (HOSPITAL_COMMUNITY): Payer: Self-pay | Admitting: Emergency Medicine

## 2023-07-10 DIAGNOSIS — Z20822 Contact with and (suspected) exposure to covid-19: Secondary | ICD-10-CM | POA: Insufficient documentation

## 2023-07-10 DIAGNOSIS — J0100 Acute maxillary sinusitis, unspecified: Secondary | ICD-10-CM | POA: Insufficient documentation

## 2023-07-10 DIAGNOSIS — R0789 Other chest pain: Secondary | ICD-10-CM | POA: Diagnosis not present

## 2023-07-10 DIAGNOSIS — I509 Heart failure, unspecified: Secondary | ICD-10-CM | POA: Diagnosis not present

## 2023-07-10 DIAGNOSIS — I110 Hypertensive heart disease with heart failure: Secondary | ICD-10-CM | POA: Insufficient documentation

## 2023-07-10 DIAGNOSIS — Z91040 Latex allergy status: Secondary | ICD-10-CM | POA: Insufficient documentation

## 2023-07-10 DIAGNOSIS — Z79899 Other long term (current) drug therapy: Secondary | ICD-10-CM | POA: Diagnosis not present

## 2023-07-10 DIAGNOSIS — R0981 Nasal congestion: Secondary | ICD-10-CM | POA: Diagnosis not present

## 2023-07-10 DIAGNOSIS — R0989 Other specified symptoms and signs involving the circulatory and respiratory systems: Secondary | ICD-10-CM | POA: Diagnosis not present

## 2023-07-10 LAB — CBC WITH DIFFERENTIAL/PLATELET
Abs Immature Granulocytes: 0.03 10*3/uL (ref 0.00–0.07)
Basophils Absolute: 0 10*3/uL (ref 0.0–0.1)
Basophils Relative: 0 %
Eosinophils Absolute: 0.1 10*3/uL (ref 0.0–0.5)
Eosinophils Relative: 1 %
HCT: 38 % (ref 36.0–46.0)
Hemoglobin: 12.3 g/dL (ref 12.0–15.0)
Immature Granulocytes: 0 %
Lymphocytes Relative: 19 %
Lymphs Abs: 1.5 10*3/uL (ref 0.7–4.0)
MCH: 28.5 pg (ref 26.0–34.0)
MCHC: 32.4 g/dL (ref 30.0–36.0)
MCV: 88.2 fL (ref 80.0–100.0)
Monocytes Absolute: 0.5 10*3/uL (ref 0.1–1.0)
Monocytes Relative: 6 %
Neutro Abs: 5.7 10*3/uL (ref 1.7–7.7)
Neutrophils Relative %: 74 %
Platelets: 228 10*3/uL (ref 150–400)
RBC: 4.31 MIL/uL (ref 3.87–5.11)
RDW: 13.8 % (ref 11.5–15.5)
WBC: 7.9 10*3/uL (ref 4.0–10.5)
nRBC: 0 % (ref 0.0–0.2)

## 2023-07-10 LAB — SARS CORONAVIRUS 2 BY RT PCR: SARS Coronavirus 2 by RT PCR: NEGATIVE

## 2023-07-10 LAB — COMPREHENSIVE METABOLIC PANEL
ALT: 28 U/L (ref 0–44)
AST: 20 U/L (ref 15–41)
Albumin: 4.1 g/dL (ref 3.5–5.0)
Alkaline Phosphatase: 89 U/L (ref 38–126)
Anion gap: 6 (ref 5–15)
BUN: 12 mg/dL (ref 6–20)
CO2: 29 mmol/L (ref 22–32)
Calcium: 9 mg/dL (ref 8.9–10.3)
Chloride: 104 mmol/L (ref 98–111)
Creatinine, Ser: 0.99 mg/dL (ref 0.44–1.00)
GFR, Estimated: >60 mL/min (ref >60)
Glucose, Bld: 90 mg/dL (ref 70–99)
Potassium: 3.7 mmol/L (ref 3.5–5.1)
Sodium: 139 mmol/L (ref 135–145)
Total Bilirubin: 0.6 mg/dL (ref <1.2)
Total Protein: 7.6 g/dL (ref 6.5–8.1)

## 2023-07-10 LAB — TROPONIN I (HIGH SENSITIVITY): Troponin I (High Sensitivity): 3 ng/L (ref <18)

## 2023-07-10 LAB — BRAIN NATRIURETIC PEPTIDE: B Natriuretic Peptide: 122 pg/mL — ABNORMAL HIGH (ref 0.0–100.0)

## 2023-07-10 MED ORDER — AZITHROMYCIN 250 MG PO TABS
ORAL_TABLET | ORAL | 0 refills | Status: DC
Start: 2023-07-10 — End: 2024-01-17

## 2023-07-10 NOTE — Discharge Instructions (Addendum)
Follow up with your doctor for recheck if symptoms persist.   Take zithromax as prescribed. Continue your other medications as well, including nasal spray and inhaler, Tylenol for aches or any fever.

## 2023-07-10 NOTE — ED Notes (Signed)
Blood pressure taken several times with different results, history shows it does vary.

## 2023-07-10 NOTE — Telephone Encounter (Signed)
Copied from CRM [id number]. Topic: Appointments - Appointment Scheduling >> Jul 10, 2023 11:13 AM Cassiday T wrote: Patient/patient representative is calling to schedule an appointment. Refer to attachments for appointment information.     Patient is coughing and sneezing and has  congestion and wants a telephone appt   Chief Complaint: Coughing and Sneezing Symptoms: Consistent Coughing, Sneezing, SOB, Mild Central Chest Pain with or without Coughing.  Frequency: ;Acute Pertinent Negatives: Patient denies Fever Disposition: [x] ED /[] Urgent Care (no appt availability in office) / [] Appointment(In office/virtual)/ []  [facility]/ [] Home Care/ [] Refused Recommended Disposition /[]  Mobile Bus/ []  Follow-up with PCP Additional Notes: Patient states that she coughed and sneezed all throughout the night and is having a runny nose and sneezing as well. The patient has a history of CHF and blood clots. Complaints of Dyspnea and mild central chest pain as well. Suggested the patient go to the ED. Patient plans to follow disposition.     Reason for Disposition  Patient sounds very sick or weak to the triager  Answer Assessment - Initial Assessment Questions 1. ONSET: "When did the cough begin?"      Saturday 2. SEVERITY: "How bad is the cough today?"      Constant, unable to sleep.  3. SPUTUM: "Describe the color of your sputum" (none, dry cough; clear, white, yellow, green)     Non productive.  4. HEMOPTYSIS: "Are you coughing up any blood?" If so ask: "How much?" (flecks, streaks, tablespoons, etc.)     Non productive.  5. DIFFICULTY BREATHING: "Are you having difficulty breathing?" If Yes, ask: "How bad is it?" (e.g., mild, moderate, severe)    - MILD: No SOB at rest, mild SOB with walking, speaks normally in sentences, can lie down, no retractions, pulse < 100.    - MODERATE: SOB at rest, SOB with minimal exertion and prefers to sit, cannot lie down flat, speaks in  phrases, mild retractions, audible wheezing, pulse 100-120.    - SEVERE: Very SOB at rest, speaks in single words, struggling to breathe, sitting hunched forward, retractions, pulse > 120      Moderate 6. FEVER: "Do you have a fever?" If Yes, ask: "What is your temperature, how was it measured, and when did it start?"     No Fever. 98.7 7. CARDIAC HISTORY: "Do you have any history of heart disease?" (e.g., heart attack, congestive heart failure)      CHF 8. LUNG HISTORY: "Do you have any history of lung disease?"  (e.g., pulmonary embolus, asthma, emphysema)     Asthma 9. PE RISK FACTORS: "Do you have a history of blood clots?" (or: recent major surgery, recent prolonged travel, bedridden)      Post Surgical Blood Clots 10. OTHER SYMPTOMS: "Do you have any other symptoms?" (e.g., runny nose, wheezing, chest pain)       Runny Nose, Sneezing 11. PREGNANCY: "Is there any chance you are pregnant?" "When was your last menstrual period?"       No 12. TRAVEL: "Have you traveled out of the country in the last month?" (e.g., travel history, exposures)       No  Protocols used: Cough - Acute Non-Productive-A-AH

## 2023-07-10 NOTE — ED Triage Notes (Signed)
Pt c/o URI x 2 days, pt c/o facial pain, nasal drainage, headache, and chest congestion

## 2023-07-10 NOTE — Telephone Encounter (Signed)
Called patient left voicemail to contact our office. 

## 2023-07-10 NOTE — ED Provider Triage Note (Signed)
Emergency Medicine Provider Triage Evaluation Note  Mary [name] , a 42 y.o. female  was evaluated in triage.  Pt complains of URI.  Review of Systems  Positive: Cough, congestion, sinus pressure Negative: Fever, vomiting, diarrhea  Physical Exam  BP (!) 159/131 (BP Location: Right Wrist)   Pulse 63   Temp 98.4 F (36.9 C) (Oral)   Resp 19   Ht 5\' 9"  (1.753 m)   Wt (!) 162.8 kg   SpO2 99%   BMI 53.02 kg/m  Gen:   Awake, no distress   Resp:  Normal effort No wheezing, good air movement MSK:   Moves extremities without difficulty  Other:    Medical Decision Making  Medically screening exam initiated at 1:14 PM.  Appropriate orders placed.  Mary [name] was informed that the remainder of the evaluation will be completed by another provider, this initial triage assessment does not replace that evaluation, and the importance of remaining in the ED until their evaluation is complete.  Patient w/history of asthma presents with 2 days of URI symptoms without fever. Using inhaler without control of cough. Reports sinus and facial pain with ache in bilateral jaw.    Elpidio Anis, PA-C 07/10/23 1316

## 2023-07-10 NOTE — ED Provider Notes (Signed)
[place] EMERGENCY DEPARTMENT AT [facility] Provider Note   CSN: [id number] Arrival date & time: 07/10/23  1150     History  Chief Complaint  Patient presents with   URI    Mary [name] is a 42 y.o. female.  Patient to ED with symptoms of sinus and nasal congestion over the last 2 days. She reports using her inhaler frequently without relief. Today reports aching in bilateral jaw, and was told by her doctor to report to the ED. History of CHF, valvular disease, per patient. Reports chest tightness that is constant. No fever. No sick contacts. Has LE edema but reports it is "always swollen".   The history is provided by the patient. No language interpreter was used.  URI      Home Medications Prior to Admission medications   Medication Sig Start Date End Date Taking? Authorizing Provider  azithromycin (ZITHROMAX Z-PAK) 250 MG tablet Take 2 tablets today. Then take one tablet each day for 4 days starting tomorrow. 07/10/23  Yes Amadeus Oyama, Melvenia Beam, PA-C  albuterol (VENTOLIN HFA) 108 (90 Base) MCG/ACT inhaler Inhale 2 puffs into the lungs every 6 (six) hours as needed for wheezing or shortness of breath. 07/18/22   Cook, Verdis Frederickson, DO  Atogepant (QULIPTA) 10 MG TABS Take 10 mg by mouth daily. 05/26/21   Sonny Masters, FNP  celecoxib (CELEBREX) 200 MG capsule Take 1 capsule (200 mg total) by mouth 2 (two) times daily. 05/11/22   Vickki Hearing, MD  Cholecalciferol (VITAMIN D3) 1000 units CAPS Take 3,000 Units by mouth daily.    [provider]  citalopram (CELEXA) 20 MG tablet Take 1 tablet (20 mg total) by mouth daily. For depression 06/18/23   Gilmore Laroche, FNP  dapagliflozin propanediol (FARXIGA) 10 MG TABS tablet Take 1 tablet (10 mg total) by mouth daily before breakfast. 01/12/22   Janetta Hora, PA-C  DULERA 100-5 MCG/ACT AERO INHALE 2 PUFFS TWICE DAILY 07/29/21   Rakes, Doralee Albino, FNP  eszopiclone (LUNESTA) 1 MG TABS tablet Take 1 tablet (1 mg  total) by mouth at bedtime as needed for sleep. Take immediately before bedtime 12/15/22   Everlene Other G, DO  fluticasone (FLONASE) 50 MCG/ACT nasal spray Place 1 spray into both nostrils daily. For allergies 06/18/23   Gilmore Laroche, FNP  hydrOXYzine (VISTARIL) 25 MG capsule Take 1 capsule (25 mg total) by mouth every 8 (eight) hours as needed. For anxiety 06/18/23   Gilmore Laroche, FNP  ketoconazole (NIZORAL) 2 % cream Apply 1 Application topically daily. 03/06/23   Adline Potter, NP  levocetirizine (XYZAL) 5 MG tablet Take 1 tablet (5 mg total) by mouth every evening. 06/20/22   Ameduite, Alvino Chapel, FNP  losartan (COZAAR) 100 MG tablet 100 mg one daily 11/24/22   Luking, Jonna Coup, MD  melatonin 3 MG TABS tablet Take 10 mg by mouth at bedtime.    [provider]  omeprazole (PRILOSEC) 40 MG capsule Take 40 mg by mouth daily.    [provider]  ondansetron (ZOFRAN-ODT) 4 MG disintegrating tablet TAKE ONE TABLET EVERY 8 HOURS AS NEEDED FOR NAUSEA AND VOMITING 06/18/23   Gilmore Laroche, FNP  oxybutynin (DITROPAN-XL) 10 MG 24 hr tablet Take 1 tablet (10 mg total) by mouth at bedtime. 07/09/23   Alfredo Martinez, MD  pregabalin (LYRICA) 75 MG capsule TAKE ONE CAPSULE THREE TIMES DAILY for nerve pain 06/18/23   Gilmore Laroche, FNP  Semaglutide-Weight Management (WEGOVY) 0.25 MG/0.5ML SOAJ Inject  0.25 mg into the skin once a week. 06/25/23   Gilmore Laroche, FNP  torsemide (DEMADEX) 20 MG tablet Take 1 tablet (20 mg total) by mouth daily. For swelling 06/18/23   Gilmore Laroche, FNP  Vitamin D, Ergocalciferol, (DRISDOL) 1.25 MG (50000 UNIT) CAPS capsule Take 1 capsule (50,000 Units total) by mouth every 7 (seven) days. 06/25/23   Gilmore Laroche, FNP      Allergies    Buprenorphine hcl, Morphine, Morphine and codeine, Penicillins, Gabapentin, Nsaids, Sulfa antibiotics, Ibuprofen-acetaminophen, Latex, and Tylenol [acetaminophen]    Review of Systems   Review of  Systems  Physical Exam Updated Vital Signs BP (!) 159/131 (BP Location: Right Wrist)   Pulse 63   Temp 98.4 F (36.9 C) (Oral)   Resp 19   Ht 5\' 9"  (1.753 m)   Wt (!) 162.8 kg   SpO2 99%   BMI 53.02 kg/m  Physical Exam Vitals and nursing note reviewed.  Constitutional:      Appearance: Normal appearance.  HENT:     Nose:     Right Sinus: Maxillary sinus tenderness present.     Left Sinus: Maxillary sinus tenderness present.     Mouth/Throat:     Mouth: Mucous membranes are moist.  Eyes:     Conjunctiva/sclera: Conjunctivae normal.  Cardiovascular:     Rate and Rhythm: Normal rate and regular rhythm.     Heart sounds: No murmur heard. Pulmonary:     Effort: Pulmonary effort is normal.     Breath sounds: No wheezing, rhonchi or rales.  Musculoskeletal:     Cervical back: Normal range of motion and neck supple.     Comments: No pitting edema in LE's.   Skin:    General: Skin is warm and dry.  Neurological:     Mental Status: She is alert and oriented to person, place, and time.     ED Results / Procedures / Treatments   Labs (all labs ordered are listed, but only abnormal results are displayed) Labs Reviewed  BRAIN NATRIURETIC PEPTIDE - Abnormal; Notable for the following components:      Result Value   B Natriuretic Peptide 122.0 (*)    All other components within normal limits  SARS CORONAVIRUS 2 BY RT PCR  CBC WITH DIFFERENTIAL/PLATELET  COMPREHENSIVE METABOLIC PANEL  TROPONIN I (HIGH SENSITIVITY)  TROPONIN I (HIGH SENSITIVITY)   Results for orders placed or performed during the hospital encounter of 07/10/23  SARS Coronavirus 2 by RT PCR (hospital order, performed in [facility] hospital lab) *cepheid single result test* Anterior Nasal Swab   Specimen: Anterior Nasal Swab  Result Value Ref Range   SARS Coronavirus 2 by RT PCR NEGATIVE NEGATIVE  Brain natriuretic peptide  Result Value Ref Range   B Natriuretic Peptide 122.0 (H) 0.0 - 100.0 pg/mL  CBC  with Differential  Result Value Ref Range   WBC 7.9 4.0 - 10.5 K/uL   RBC 4.31 3.87 - 5.11 MIL/uL   Hemoglobin 12.3 12.0 - 15.0 g/dL   HCT 09.8 11.9 - 14.7 %   MCV 88.2 80.0 - 100.0 fL   MCH 28.5 26.0 - 34.0 pg   MCHC 32.4 30.0 - 36.0 g/dL   RDW 82.9 56.2 - 13.0 %   Platelets 228 150 - 400 K/uL   nRBC 0.0 0.0 - 0.2 %   Neutrophils Relative % 74 %   Neutro Abs 5.7 1.7 - 7.7 K/uL   Lymphocytes Relative 19 %   Lymphs Abs 1.5  0.7 - 4.0 K/uL   Monocytes Relative 6 %   Monocytes Absolute 0.5 0.1 - 1.0 K/uL   Eosinophils Relative 1 %   Eosinophils Absolute 0.1 0.0 - 0.5 K/uL   Basophils Relative 0 %   Basophils Absolute 0.0 0.0 - 0.1 K/uL   Immature Granulocytes 0 %   Abs Immature Granulocytes 0.03 0.00 - 0.07 K/uL  Comprehensive metabolic panel  Result Value Ref Range   Sodium 139 135 - 145 mmol/L   Potassium 3.7 3.5 - 5.1 mmol/L   Chloride 104 98 - 111 mmol/L   CO2 29 22 - 32 mmol/L   Glucose, Bld 90 70 - 99 mg/dL   BUN 12 6 - 20 mg/dL   Creatinine, Ser 9.62 0.44 - 1.00 mg/dL   Calcium 9.0 8.9 - 95.2 mg/dL   Total Protein 7.6 6.5 - 8.1 g/dL   Albumin 4.1 3.5 - 5.0 g/dL   AST 20 15 - 41 U/L   ALT 28 0 - 44 U/L   Alkaline Phosphatase 89 38 - 126 U/L   Total Bilirubin 0.6 <1.2 mg/dL   GFR, Estimated >84 >13 mL/min   Anion gap 6 5 - 15  Troponin I (High Sensitivity)  Result Value Ref Range   Troponin I (High Sensitivity) 3 <18 ng/L    EKG None  Radiology DG Chest 2 View  Result Date: 07/10/2023 CLINICAL DATA:  Congestion. EXAM: CHEST - 2 VIEW COMPARISON:  Chest radiographs 11/14/2022 FINDINGS: Cardiac silhouette is again at the upper limits of normal size. Mediastinal contours are within limits. The lungs are clear. No pleural effusion or pneumothorax. No acute skeletal abnormality. IMPRESSION: No active cardiopulmonary disease. Electronically Signed   By: Neita Garnet M.D.   On: 07/10/2023 16:46    Procedures Procedures    Medications Ordered in ED Medications -  No data to display  ED Course/ Medical Decision Making/ A&P Clinical Course as of 07/10/23 1825  Tue Jul 10, 2023  1559 Patient to ED for URI symptoms but reports concern regarding history of CHF and valve problems in the setting of jaw aching and chest tightness. COVID collected and is negative. CHF work up initiated based on patient concerns, and reason her PCP sent her to ED.   Blood pressure elevated. Per chart review, it fluctuates a great deal. She reports being compliant with medications.  [SU]    Clinical Course User Index [SU] Elpidio Anis, PA-C                                 Medical Decision Making This patient presents to the ED for concern of chest tightness, jaw pain, this involves an extensive number of treatment options, and is a complaint that carries with it a high risk of complications and morbidity.  The differential diagnosis includes CHF exacerbation, ACS, infection, pneumonia, sinus infection   Co morbidities that complicate the patient evaluation  Obesity, CHF (per patient), HTN    Additional history obtained:  Additional history obtained from spouse  Lab Tests:   I Ordered, and personally interpreted labs.  The pertinent results include:  BNP 122, troponin 3, no leukocytosis.     Imaging Studies ordered:  I ordered imaging studies including CXR  I independently visualized and interpreted imaging which showed no significant edema, no infiltrates I agree with the radiologist interpretation   Cardiac Monitoring: / EKG:  The patient was maintained on a cardiac monitor.  I personally viewed and interpreted the cardiac monitored which showed an underlying rhythm of: sinus rhythm    Problem List / ED Course / Critical interventions / Medication management  Patient to ED with concern for symptoms c/w URI, sinus pressure, but reported chest tightness and jaw pain to PCP who advised she come for further evaluation. No evidence heart ischemia or fluid  overload. She has sinus tenderness, cough, congestion x 4 days. Sinusitis likely cause of jaw aches.   Social Determinants of Health:  Poor insight into health Supportive spouse   Test / Admission - Considered:  Ok to discharge home. Will provide Zithromax (PCN, sulfa allergy) to cover sinusitis type illness. Recommend PCP follow up.     Amount and/or Complexity of Data Reviewed Labs: ordered. Radiology: ordered.  Risk Prescription drug management.           Final Clinical Impression(s) / ED Diagnoses Final diagnoses:  Acute non-recurrent maxillary sinusitis    Rx / DC Orders ED Discharge Orders          Ordered    azithromycin (ZITHROMAX Z-PAK) 250 MG tablet        07/10/23 1824              Elpidio Anis, PA-C 07/10/23 1843    Durwin Glaze, MD 07/11/23 1901

## 2023-07-10 NOTE — ED Notes (Signed)
Patient transported to X-ray 

## 2023-07-11 ENCOUNTER — Telehealth: Payer: Self-pay

## 2023-07-11 NOTE — Transitions of Care (Post Inpatient/ED Visit) (Signed)
07/11/2023  Name: Mary [name] MRN: [id number] DOB: June 27, 1981  Today's TOC FU Call Status: Today's TOC FU Call Status:: Successful TOC FU Call Completed TOC FU Call Complete Date: 07/11/23 Patient's Name and Date of Birth confirmed.  Transition Care Management Follow-up Telephone Call Date of Discharge: 07/10/23 [facility] Type of Discharge: Emergency Department Reason for ED Visit: Respiratory, Other: (sinusitis) How have you been since you were released from the hospital?: Better Any questions or concerns?: No  Items Reviewed: Did you receive and understand the discharge instructions provided?: Yes Medications obtained,verified, and reconciled?: Yes (Medications Reviewed) Any new allergies since your discharge?: No Dietary orders reviewed?: NA Do you have support at home?: Yes People in Home: parent(s)  Medications Reviewed Today: Medications Reviewed Today     Reviewed by Karena Addison, LPN (Licensed Practical Nurse) on 07/11/23 at 1217  Med List Status: <None>   Medication Order Taking? Sig Documenting Provider Last Dose Status Informant  albuterol (VENTOLIN HFA) 108 (90 Base) MCG/ACT inhaler [id number] No Inhale 2 puffs into the lungs every 6 (six) hours as needed for wheezing or shortness of breath. [name], DO Taking Active   Atogepant (QULIPTA) 10 MG TABS [id number] No Take 10 mg by mouth daily. Sonny Masters, FNP Taking Active   azithromycin (ZITHROMAX Z-PAK) 250 MG tablet [id number]  Take 2 tablets today. Then take one tablet each day for 4 days starting tomorrow. Elpidio Anis, PA-C  Active   celecoxib (CELEBREX) 200 MG capsule [id number] No Take 1 capsule (200 mg total) by mouth 2 (two) times daily. Vickki Hearing, MD Taking Active   Cholecalciferol (VITAMIN D3) 1000 units CAPS [id number] No Take 3,000 Units by mouth daily. [provider] Taking Active   citalopram (CELEXA) 20 MG tablet [id number] No Take 1 tablet  (20 mg total) by mouth daily. For depression Gilmore Laroche, FNP Taking Active   dapagliflozin propanediol (FARXIGA) 10 MG TABS tablet [id number] No Take 1 tablet (10 mg total) by mouth daily before breakfast. Janetta Hora, PA-C Taking Active   DULERA 100-5 MCG/ACT AERO [id number] No INHALE 2 PUFFS TWICE DAILY Rakes, Doralee Albino, FNP Taking Active   eszopiclone (LUNESTA) 1 MG TABS tablet [id number] No Take 1 tablet (1 mg total) by mouth at bedtime as needed for sleep. Take immediately before bedtime Adriana Simas, Jayce G, DO Taking Active   fluticasone (FLONASE) 50 MCG/ACT nasal spray [id number] No Place 1 spray into both nostrils daily. For allergies Gilmore Laroche, FNP Taking Active   hydrOXYzine (VISTARIL) 25 MG capsule [id number] No Take 1 capsule (25 mg total) by mouth every 8 (eight) hours as needed. For anxiety Gilmore Laroche, FNP Taking Active   ketoconazole (NIZORAL) 2 % cream [id number] No Apply 1 Application topically daily. Adline Potter, NP Taking Active   levocetirizine (XYZAL) 5 MG tablet [id number] No Take 1 tablet (5 mg total) by mouth every evening. Ameduite, Alvino Chapel, FNP Taking Active   losartan (COZAAR) 100 MG tablet [id number] No 100 mg one daily Luking, Scott A, MD Taking Active   melatonin 3 MG TABS tablet [id number] No Take 10 mg by mouth at bedtime. [provider] Taking Active   omeprazole (PRILOSEC) 40 MG capsule [id number] No Take 40 mg by mouth daily. [provider] Taking Active   ondansetron (ZOFRAN-ODT) 4 MG disintegrating tablet [id number] No TAKE ONE TABLET EVERY 8 HOURS AS NEEDED FOR NAUSEA AND VOMITING Gilmore Laroche, FNP Taking Active   oxybutynin (DITROPAN-XL) 10 MG  24 hr tablet [id number]  Take 1 tablet (10 mg total) by mouth at bedtime. Alfredo Martinez, MD  Active   pregabalin (LYRICA) 75 MG capsule [id number] No TAKE ONE CAPSULE THREE TIMES DAILY for nerve pain Gilmore Laroche, FNP Taking Active   Semaglutide-Weight Management (WEGOVY) 0.25  MG/0.5ML SOAJ [id number] No Inject 0.25 mg into the skin once a week. Gilmore Laroche, FNP Taking Active   torsemide (DEMADEX) 20 MG tablet [id number] No Take 1 tablet (20 mg total) by mouth daily. For swelling Gilmore Laroche, FNP Taking Active   Vitamin D, Ergocalciferol, (DRISDOL) 1.25 MG (50000 UNIT) CAPS capsule [id number] No Take 1 capsule (50,000 Units total) by mouth every 7 (seven) days. Gilmore Laroche, FNP Taking Active             Home Care and Equipment/Supplies: Were Home Health Services Ordered?: NA Any new equipment or medical supplies ordered?: NA  Functional Questionnaire: Do you need assistance with bathing/showering or dressing?: No Do you need assistance with meal preparation?: No Do you need assistance with eating?: No Do you have difficulty maintaining continence: No Do you need assistance with getting out of bed/getting out of a chair/moving?: No Do you have difficulty managing or taking your medications?: No  Follow up appointments reviewed: PCP Follow-up appointment confirmed?: No (no avail appts, sent message to staff to schedule) MD Provider Line Number:[phone] Given: No Specialist Hospital Follow-up appointment confirmed?: NA Do you need transportation to your follow-up appointment?: No Do you understand care options if your condition(s) worsen?: Yes-patient verbalized understanding    SIGNATURE Karena Addison, LPN [facility] Nurse Health Advisor Direct Dial [phone]

## 2023-07-15 LAB — CULTURE, URINE COMPREHENSIVE

## 2023-07-22 DIAGNOSIS — Z419 Encounter for procedure for purposes other than remedying health state, unspecified: Secondary | ICD-10-CM | POA: Diagnosis not present

## 2023-07-30 ENCOUNTER — Telehealth (HOSPITAL_COMMUNITY): Payer: Self-pay | Admitting: Psychiatry

## 2023-07-30 ENCOUNTER — Ambulatory Visit (HOSPITAL_COMMUNITY): Payer: 59 | Admitting: Psychiatry

## 2023-07-30 NOTE — Telephone Encounter (Signed)
Therapist attempted to contact pt via phone regarding scheduled in office appointment and received voice mail recording. Therapist left message indicating attempt and requesting patient call office.

## 2023-08-01 DIAGNOSIS — G8929 Other chronic pain: Secondary | ICD-10-CM | POA: Diagnosis not present

## 2023-08-01 DIAGNOSIS — N3946 Mixed incontinence: Secondary | ICD-10-CM | POA: Diagnosis not present

## 2023-08-01 DIAGNOSIS — E119 Type 2 diabetes mellitus without complications: Secondary | ICD-10-CM | POA: Diagnosis not present

## 2023-08-01 DIAGNOSIS — Z90710 Acquired absence of both cervix and uterus: Secondary | ICD-10-CM | POA: Diagnosis not present

## 2023-08-01 DIAGNOSIS — Z885 Allergy status to narcotic agent status: Secondary | ICD-10-CM | POA: Diagnosis not present

## 2023-08-01 DIAGNOSIS — Z79899 Other long term (current) drug therapy: Secondary | ICD-10-CM | POA: Diagnosis not present

## 2023-08-01 DIAGNOSIS — Z880 Allergy status to penicillin: Secondary | ICD-10-CM | POA: Diagnosis not present

## 2023-08-01 DIAGNOSIS — M549 Dorsalgia, unspecified: Secondary | ICD-10-CM | POA: Diagnosis not present

## 2023-08-01 DIAGNOSIS — R109 Unspecified abdominal pain: Secondary | ICD-10-CM | POA: Diagnosis not present

## 2023-08-01 DIAGNOSIS — Z886 Allergy status to analgesic agent status: Secondary | ICD-10-CM | POA: Diagnosis not present

## 2023-08-01 DIAGNOSIS — Z7982 Long term (current) use of aspirin: Secondary | ICD-10-CM | POA: Diagnosis not present

## 2023-08-07 DIAGNOSIS — G8929 Other chronic pain: Secondary | ICD-10-CM | POA: Diagnosis not present

## 2023-08-07 DIAGNOSIS — N3946 Mixed incontinence: Secondary | ICD-10-CM | POA: Diagnosis not present

## 2023-08-07 DIAGNOSIS — R109 Unspecified abdominal pain: Secondary | ICD-10-CM | POA: Diagnosis not present

## 2023-08-13 ENCOUNTER — Ambulatory Visit (INDEPENDENT_AMBULATORY_CARE_PROVIDER_SITE_OTHER): Payer: 59 | Admitting: Psychiatry

## 2023-08-13 DIAGNOSIS — F4310 Post-traumatic stress disorder, unspecified: Secondary | ICD-10-CM | POA: Diagnosis not present
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
## 2023-08-14 DIAGNOSIS — R531 Weakness: Secondary | ICD-10-CM | POA: Diagnosis not present

## 2023-08-14 DIAGNOSIS — Z789 Other specified health status: Secondary | ICD-10-CM | POA: Diagnosis not present

## 2023-08-14 DIAGNOSIS — R102 Pelvic and perineal pain: Secondary | ICD-10-CM | POA: Diagnosis not present

## 2023-08-14 DIAGNOSIS — N3946 Mixed incontinence: Secondary | ICD-10-CM | POA: Diagnosis not present

## 2023-08-14 DIAGNOSIS — Z90710 Acquired absence of both cervix and uterus: Secondary | ICD-10-CM | POA: Diagnosis not present

## 2023-08-14 DIAGNOSIS — R278 Other lack of coordination: Secondary | ICD-10-CM | POA: Diagnosis not present

## 2023-08-14 DIAGNOSIS — R2689 Other abnormalities of gait and mobility: Secondary | ICD-10-CM | POA: Diagnosis not present

## 2023-08-20 ENCOUNTER — Ambulatory Visit: Payer: 59 | Admitting: Gastroenterology

## 2023-08-20 ENCOUNTER — Encounter: Payer: Self-pay | Admitting: Gastroenterology

## 2023-08-22 DIAGNOSIS — Z419 Encounter for procedure for purposes other than remedying health state, unspecified: Secondary | ICD-10-CM | POA: Diagnosis not present

## 2023-08-27 ENCOUNTER — Ambulatory Visit (HOSPITAL_COMMUNITY): Payer: 59 | Admitting: Psychiatry

## 2023-09-03 ENCOUNTER — Telehealth: Payer: Self-pay | Admitting: Family Medicine

## 2023-09-03 NOTE — Telephone Encounter (Signed)
Copied from CRM [id number]. Topic: Medical Record Request - Other >> Sep 03, 2023  2:39 PM Ivette P wrote: Reason for CRM: Pt called in to notify that [facility] faxed over papers for insurance to pay for the wegovy injection

## 2023-09-03 NOTE — Telephone Encounter (Signed)
 Copied from CRM [id number]. Topic: General - Other >> Aug 30, 2023  3:04 PM Zacyrah J wrote: Reason for CRM: [facility] sent a PA for Dr. Meade for medication Semaglutide -Weight Management (WEGOVY ) 0.25 MG/0.5ML SOAJ waiting on Dr. Meade to send PA back to [facility]

## 2023-09-04 NOTE — Telephone Encounter (Signed)
 Will look into it

## 2023-09-07 ENCOUNTER — Ambulatory Visit: Payer: Self-pay | Admitting: Family Medicine

## 2023-09-07 NOTE — Telephone Encounter (Signed)
Chief Complaint: Anxiety on medication Symptoms: uncontrolled crying, patient having issues getting daily activities completed.   Frequency: increased over last 2-3 months. Pertinent Negatives: Patient denies fever, CP Disposition: [x] ED /[] Urgent Care (no appt availability in office) / [] Appointment(In office/virtual)/ []  [place] [facility]/ [] Home Care/ [] Refused Recommended Disposition /[] [place] [facility]/ []  Follow-up with PCP Additional Notes: prior to the agent transferring the call, patient was crying heavily with agent. Patient transferred to this RN. Patient states that she recently had her father return to her life but he has now been diagnosed with cancer. Patient is audibly upset while speaking stating "he hasn't been in my life for 44 years and now that he is back he is might die." Patient endorses uncontrollable crying, feeling that she is staring off into space for long periods of time, issues with getting daily activities completed. Patient endorses taking her medications but that her daily anxiety medication isn't lasting and her hydroxyzine isn't working for her. Patient states she feels like she is in need of something stronger to help her anxiety and depression. Per protocol, patient is recommended for the ED.  Patient verbalizes understanding and will be going to the ED for evaluation with someone else driving her. All questions answered.   Copied from CRM [id number]. Topic: Clinical - Red Word Triage >> Sep 07, 2023  3:05 PM Fredrica W wrote: Red Word that prompted transfer to Nurse Triage: Tragedy with dad having cancer need something to help keep her calm through out the day. Crying spells. Reason for Disposition  [1] SEVERE anxiety (e.g., extremely anxious with intense emotional symptoms such as feeling of unreality, urge to flee, unable to calm down; unable to cope or function) AND [2] not better after 10 minutes of reassurance and Care Advice  Answer Assessment -  Initial Assessment Questions 1. CONCERN: "Did anything happen that prompted you to call today?"      Patient's father is diagnosed with cancer and is having larger anxiety issues. 2. ANXIETY SYMPTOMS: "Can you describe how you (your loved one; patient) have been feeling?" (e.g., tense, restless, panicky, anxious, keyed up, overwhelmed, sense of impending doom).      Crying, anxiety, overwhelmed 3. ONSET: "How long have you been feeling this way?" (e.g., hours, days, weeks)     Increasing over the last 2-3 months 4. SEVERITY: "How would you rate the level of anxiety?" (e.g., 0 - 10; or mild, moderate, severe).     moderate 5. FUNCTIONAL IMPAIRMENT: "How have these feelings affected your ability to do daily activities?" "Have you had more difficulty than usual doing your normal daily activities?" (e.g., getting better, same, worse; self-care, school, work, interactions)     Yes 6. HISTORY: "Have you felt this way before?" "Have you ever been diagnosed with an anxiety problem in the past?" (e.g., generalized anxiety disorder, panic attacks, PTSD). If Yes, ask: "How was this problem treated?" (e.g., medicines, counseling, etc.)     Been diagnosed with depression and anxiety since she was a child 7. RISK OF HARM - SUICIDAL IDEATION: "Do you ever have thoughts of hurting or killing yourself?" If Yes, ask:  "Do you have these feelings now?" "Do you have a plan on how you would do this?"     No 8. TREATMENT:  "What has been done so far to treat this anxiety?" (e.g., medicines, relaxation strategies). "What has helped?"     Medication isn't helping 9. TREATMENT - THERAPIST: "Do you have a counselor or therapist? Name?"  No 10. POTENTIAL TRIGGERS: "Do you drink caffeinated beverages (e.g., coffee, colas, teas), and how much daily?" "Do you drink alcohol or use any drugs?" "Have you started any new medicines recently?"       No 11. PATIENT SUPPORT: "Who is with you now?" "Who do you live with?" "Do you  have family or friends who you can talk to?"        Mother lives down the road and her grandparents live two doors down 12. OTHER SYMPTOMS: "Do you have any other symptoms?" (e.g., feeling depressed, trouble concentrating, trouble sleeping, trouble breathing, palpitations or fast heartbeat, chest pain, sweating, nausea, or diarrhea)       Depression, trouble concentrating,  Protocols used: Anxiety and Panic Attack-A-AH

## 2023-09-11 ENCOUNTER — Telehealth: Payer: Self-pay | Admitting: Family Medicine

## 2023-09-11 ENCOUNTER — Ambulatory Visit: Payer: Medicaid Other

## 2023-09-11 NOTE — Telephone Encounter (Signed)
Med approved 

## 2023-09-11 NOTE — Telephone Encounter (Signed)
PA submitted 09/11/23

## 2023-09-17 ENCOUNTER — Ambulatory Visit: Payer: 59 | Admitting: Urology

## 2023-09-17 ENCOUNTER — Encounter: Payer: Self-pay | Admitting: Urology

## 2023-09-22 DIAGNOSIS — Z419 Encounter for procedure for purposes other than remedying health state, unspecified: Secondary | ICD-10-CM | POA: Diagnosis not present

## 2023-09-25 ENCOUNTER — Ambulatory Visit: Payer: 59 | Admitting: Family Medicine

## 2023-10-01 ENCOUNTER — Ambulatory Visit: Payer: Self-pay | Admitting: Family Medicine

## 2023-10-01 NOTE — Telephone Encounter (Signed)
  Chief Complaint: lower back pain Symptoms: spasms Frequency: 2 days ago Pertinent Negatives: Patient denies fever, SOB, Chest pain Disposition: [] ED /[] Urgent Care (no appt availability in office) / [x] Appointment(In office/virtual)/ []  [place] [facility]/ [] Home Care/ [] Refused Recommended Disposition /[] [place] [facility]/ []  Follow-up with PCP Additional Notes: Patient c/o lower back pain/spasms that have worsened over the past 2 days. Reports that she has hx of chronic pain with bulging discs. Reports that her pain has affected her sleep and daily activities. Endorses using Aleve  and heating pad with minimal to no relief. Of note, does endorse bilateral hand numbness upon waking up that subsides within 5 mins. Scheduled patient per protocol on 10/02/2023. Patient verbalized understanding and to call back with worsening symptoms.    Copied from CRM [id number]. Topic: Clinical - Red Word Triage >> Oct 01, 2023  4:11 PM Elle L wrote: Red Word that prompted transfer to Nurse Triage: The patient is having muscle spasms in her lower back, left side, and leg and it is causing her severe pain where she has been unable to sleep. Reason for Disposition  [1] MODERATE pain (e.g., interferes with normal activities) AND [2] present > 3 days  [1] Numbness in an arm or hand (i.e., loss of sensation) AND [2] upper back pain  Answer Assessment - Initial Assessment Questions 1. ONSET: "When did the muscle aches or body pains start?"      2 days 2. LOCATION: "What part of your body is hurting?" (e.g., entire body, arms, legs)      L side - under breast muscle spasms Radiates down back and legs 3. SEVERITY: "How bad is the pain?" (Scale 1-10; or mild, moderate, severe)   - MILD (1-3): doesn't interfere with normal activities    - MODERATE (4-7): interferes with normal activities or awakens from sleep    - SEVERE (8-10):  excruciating pain, unable to do any normal activities      9/10 4. CAUSE:  "What do you think is causing the pains?"     I dont know if its because of my bulging discs in my lower back 5. FEVER: "Have you been having fever?"     no 6. OTHER SYMPTOMS: "Do you have any other symptoms?" (e.g., chest pain, weakness, rash, cold or flu symptoms, weight loss)     No Has taken aleve , heating pads with no relief  Answer Assessment - Initial Assessment Questions 8. MEDICINES: "What have you taken so far for the pain?" (e.g., nothing, acetaminophen , NSAIDS)     Aleve  and heating packs Allergic to Tylenol  and NSAIDS 9. NEUROLOGIC SYMPTOMS: "Do you have any weakness, numbness, or problems with bowel/bladder control?"     Bilateral hand numbness reported when waking up 10. OTHER SYMPTOMS: "Do you have any other symptoms?" (e.g., fever, abdomen pain, burning with urination, blood in urine)       no  Protocols used: Muscle Aches and Body Pain-A-AH, Back Pain-A-AH

## 2023-10-02 ENCOUNTER — Ambulatory Visit: Payer: Self-pay | Admitting: Internal Medicine

## 2023-10-02 ENCOUNTER — Encounter: Payer: Self-pay | Admitting: Internal Medicine

## 2023-10-02 VITALS — BP 144/99 | HR 73 | Ht 69.0 in | Wt 353.6 lb

## 2023-10-02 DIAGNOSIS — S29011A Strain of muscle and tendon of front wall of thorax, initial encounter: Secondary | ICD-10-CM | POA: Diagnosis not present

## 2023-10-02 MED ORDER — CYCLOBENZAPRINE HCL 5 MG PO TABS: 5.0000 mg | ORAL_TABLET | Freq: Three times a day (TID) | ORAL | 1 refills | Status: DC | PRN

## 2023-10-02 NOTE — Patient Instructions (Signed)
It was a pleasure to see you today.  Thank you for giving [place] the opportunity to be involved in your care.  Below is a brief recap of your visit and next steps.  We will plan to see you again on 2/27.  Summary Flexeril added for as needed spasm relief Please see the attached exercises Follow up as scheduled on 2/27.

## 2023-10-02 NOTE — Assessment & Plan Note (Signed)
Presenting today for an acute visit endorsing a 2-day history of left lateral chest wall discomfort.  There is tenderness to palpation of the left lateral chest wall and pain is exacerbated by rotational movements of the torso.  She has experienced similar symptoms previously and states that they improved with muscle relaxers.  Presenting symptoms and exam findings today are most consistent with a chest wall muscle strain. -Flexeril 5 mg as needed for spasm relief added today -Home PT exercises provided -She will return to care for PCP follow-up as previously scheduled on 2/27

## 2023-10-02 NOTE — Progress Notes (Signed)
   Acute Office Visit  Subjective:     Patient ID: Mary [name], female    DOB: Jan 02, 1981, 43 y.o.   MRN: [id number]  Chief Complaint  Patient presents with   Flank Pain    Left sided pain    restless    Restless at night not being able to sleep    Mary [name] presents today for an acute visit endorsing left lateral chest wall discomfort.  She reports onset of symptoms 2 days ago.  Pain is worse with twisting of the torso and overhead motions.  Pain wakes her from sleep when rolling over in bed.  She has tried using Tylenol without significant symptom relief.  She is able to deeply inspire without significant discomfort.  No radiation of pain to the right side of her chest, her back, neck, or left arm.  Denies shortness of breath, palpitations, and dizziness/lightheadedness.  She states that she experienced similar discomfort 3 years ago.  Symptoms improved with home PT exercises and muscle relaxers.  Review of Systems  Musculoskeletal:  Positive for myalgias (Left lateral chest wall).  All other systems reviewed and are negative.     Objective:    BP (!) 144/99 (BP Location: Right Arm, Patient Position: Sitting, Cuff Size: Large)   Pulse 73   Ht 5\' 9"  (1.753 m)   Wt (!) 353 lb 9.6 oz (160.4 kg)   SpO2 92%   BMI 52.22 kg/m   Physical Exam Vitals reviewed.  Constitutional:      Appearance: Normal appearance. She is obese.  Musculoskeletal:        General: Tenderness (Left lateral chest wall) present.     Comments: She endorses pain along the left lateral chest wall with rotational movement of the torso  Neurological:     Mental Status: She is alert.       Assessment & Plan:   Problem List Items Addressed This Visit       Chest wall muscle strain, initial encounter - Primary   Presenting today for an acute visit endorsing a 2-day history of left lateral chest wall discomfort.  There is tenderness to palpation of the left lateral chest wall and pain is exacerbated  by rotational movements of the torso.  She has experienced similar symptoms previously and states that they improved with muscle relaxers.  Presenting symptoms and exam findings today are most consistent with a chest wall muscle strain. -Flexeril 5 mg as needed for spasm relief added today -Home PT exercises provided -She will return to care for PCP follow-up as previously scheduled on 2/27      Meds ordered this encounter  Medications   cyclobenzaprine (FLEXERIL) 5 MG tablet    Sig: Take 1 tablet (5 mg total) by mouth 3 (three) times daily as needed for muscle spasms.    Dispense:  30 tablet    Refill:  1   Return if symptoms worsen or fail to improve.  Billie Lade, MD

## 2023-10-08 ENCOUNTER — Telehealth: Payer: Self-pay | Admitting: Family Medicine

## 2023-10-08 NOTE — Telephone Encounter (Unsigned)
Copied from CRM [id number]. Topic: Clinical - Prescription Issue >> Oct 08, 2023  8:37 AM Gildardo Pounds wrote: Reason for CRM: Stu with [facility] cannot submit the Semaglutide-Weight Management (WEGOVY) 0.25 MG/0.5ML SOAJ for refill. Callback number [phone]

## 2023-10-08 NOTE — Telephone Encounter (Signed)
Copied from CRM [id number]. Topic: Clinical - Prescription Issue >> Oct 08, 2023  8:37 AM Gildardo Pounds wrote: Reason for CRM: Stu with [facility] cannot submit the Semaglutide-Weight Management (WEGOVY) 0.25 MG/0.5ML SOAJ for refill. Callback number [phone]

## 2023-10-09 NOTE — Telephone Encounter (Signed)
MA spoke to [facility]. They stated that the issue is that they have never received a prescription for that dose at that pharmacy. And would need a new prescription. Please resend order if medication is approved.

## 2023-10-18 ENCOUNTER — Telehealth (HOSPITAL_COMMUNITY): Payer: Self-pay | Admitting: Psychiatry

## 2023-10-18 ENCOUNTER — Encounter: Payer: Self-pay | Admitting: Family Medicine

## 2023-10-18 ENCOUNTER — Ambulatory Visit (INDEPENDENT_AMBULATORY_CARE_PROVIDER_SITE_OTHER): Payer: Medicaid Other | Admitting: Family Medicine

## 2023-10-18 ENCOUNTER — Ambulatory Visit (HOSPITAL_COMMUNITY): Payer: 59 | Admitting: Psychiatry

## 2023-10-18 VITALS — BP 131/90 | HR 79 | Ht 69.0 in | Wt 353.0 lb

## 2023-10-18 DIAGNOSIS — E559 Vitamin D deficiency, unspecified: Secondary | ICD-10-CM | POA: Diagnosis not present

## 2023-10-18 DIAGNOSIS — E1169 Type 2 diabetes mellitus with other specified complication: Secondary | ICD-10-CM | POA: Diagnosis not present

## 2023-10-18 DIAGNOSIS — E7849 Other hyperlipidemia: Secondary | ICD-10-CM | POA: Diagnosis not present

## 2023-10-18 DIAGNOSIS — Z6841 Body Mass Index (BMI) 40.0 and over, adult: Secondary | ICD-10-CM

## 2023-10-18 DIAGNOSIS — E038 Other specified hypothyroidism: Secondary | ICD-10-CM | POA: Diagnosis not present

## 2023-10-18 DIAGNOSIS — K219 Gastro-esophageal reflux disease without esophagitis: Secondary | ICD-10-CM | POA: Diagnosis not present

## 2023-10-18 DIAGNOSIS — I10 Essential (primary) hypertension: Secondary | ICD-10-CM

## 2023-10-18 DIAGNOSIS — Z7984 Long term (current) use of oral hypoglycemic drugs: Secondary | ICD-10-CM | POA: Diagnosis not present

## 2023-10-18 DIAGNOSIS — E119 Type 2 diabetes mellitus without complications: Secondary | ICD-10-CM

## 2023-10-18 DIAGNOSIS — R7301 Impaired fasting glucose: Secondary | ICD-10-CM

## 2023-10-18 MED ORDER — OMEPRAZOLE 40 MG PO CPDR: 40.0000 mg | DELAYED_RELEASE_CAPSULE | Freq: Every day | ORAL | 1 refills | Status: AC

## 2023-10-18 MED ORDER — WEGOVY 0.25 MG/0.5ML ~~LOC~~ SOAJ: 0.5000 mg | SUBCUTANEOUS | 0 refills | Status: DC

## 2023-10-18 NOTE — Assessment & Plan Note (Signed)
 Encouraged to continue torsemide 20 mg daily  A low-sodium diet with increased physical activity is also recommended. A low-sodium diet of less than 2,300 mg daily is recommended, along with moderate-intensity physical activity for at least 150 minutes per week. The patient is encouraged to maintain these lifestyle modifications to help manage her blood pressure effectively.  Long-term considerations were discussed, emphasizing that uncontrolled hypertension increases the risk of cardiovascular diseases, including stroke, coronary artery disease, and heart failure.  The patient is encouraged to seek emergency care if blood pressure exceeds 180/120 and is accompanied by symptoms such as headaches, chest pain, palpitations, blurred vision, or dizziness. She verbalized understanding and will follow up as scheduled.

## 2023-10-18 NOTE — Assessment & Plan Note (Signed)
 I recommended reducing his intake of high-sugar foods and beverages and increasing physical activity to 150 minutes of moderate intensity per week, as tolerated  Lab Results  Component Value Date   HGBA1C 5.2 06/18/2023

## 2023-10-18 NOTE — Assessment & Plan Note (Signed)
 Stable GERD diet encouraged

## 2023-10-18 NOTE — Telephone Encounter (Signed)
Schedule an office visit.

## 2023-10-18 NOTE — Assessment & Plan Note (Signed)
 The patient reports starting Wegovy 0.25 mg weekly last month and has since lost 4 pounds. No complaints were voiced. A prescription for Wegovy 0.5 mg weekly will be sent, and the patient is encouraged to implement lifestyle modifications, including increased physical activity, to support weight management.  For optimal results with weight loss, I recommend:  Decreasing portion sizes. Reducing sugar, sodium, and carbohydrate intake, and limiting saturated fats in your diet. Increasing your fiber intake by incorporating more whole grains, fruits, and vegetables. Setting healthy goals and focusing on lowering carbs, sugar, and fat. Increasing the variety of fruits and vegetables in your diet. Reducing soda consumption and limiting processed foods. In addition to taking your weight loss medication, engage in moderate-intensity physical activity for at least 150 minutes per week for the best results.

## 2023-10-18 NOTE — Progress Notes (Signed)
 Established Patient Office Visit  Subjective:  Patient ID: Mary [name], female    DOB: 04-Jan-1981  Age: 43 y.o. MRN: [id number]  CC:  Chief Complaint  Patient presents with   Hypertension    Three month follow up    HPI Mary [name] is a 43 y.o. female with past medical history of hypertension, vitamin D deficiency, and GERD presents for f/u of  chronic medical conditions. For the details of today's visit, please refer to the assessment and plan.   Past Medical History:  Diagnosis Date   Allergy    Anxiety    Asthma    Bipolar 1 disorder (HCC)    Bulging lumbar disc    BV (bacterial vaginosis) 02/18/2018   Cancer of cervix (HCC)    Carpal tunnel syndrome on right    Cervical cancer (HCC)    Chronic back pain    COPD (chronic obstructive pulmonary disease) (HCC)    Depression    Hypertension    Migraines    Physiological ovarian cysts    Plantar fasciitis    PTSD (post-traumatic stress disorder)    Rheumatoid arthritis (HCC)    S/P partial hysterectomy    Vaginal Pap smear, abnormal     Past Surgical History:  Procedure Laterality Date   ABDOMINAL HYSTERECTOMY     ABDOMINAL HYSTERECTOMY     COLONOSCOPY WITH PROPOFOL N/A 04/10/2023   Procedure: COLONOSCOPY WITH PROPOFOL;  Surgeon: Dolores Frame, MD;  Location: [facility];  Service: Gastroenterology;  Laterality: N/A;  9:00 am, asa 3, pt knows to arrive at 8:30   EYE SURGERY  age 50   Artificial right eye   ORIF ELBOW FRACTURE Left 11/18/2019   Procedure: OPEN REDUCTION WITH REPAIR VERSUS FRAGMENT EXCISION CAPITELLUM FRACTURE LEFT ELBOW AND LIGAMENTOUS RECONSTRUCTION AS NECESSARY;  Surgeon: Dominica Severin, MD;  Location: [facility];  Service: Orthopedics;  Laterality: Left;  2 HRS   POLYPECTOMY  04/10/2023   Procedure: POLYPECTOMY;  Surgeon: Marguerita Merles, Reuel Boom, MD;  Location: [facility];  Service: Gastroenterology;;    Family History  Problem Relation Age of Onset   Stroke Mother         TIA   Asthma Mother    Cancer Mother        leukemia   Other Mother        blood platelets are dropping   Heart attack Father    Hypertension Father    Cancer Father        skin cancer   Stroke Father    Colon polyps Father    Fibromyalgia Sister    Cancer Sister        cervical   Cancer Sister        cervical   Cancer Brother        thyroid cancer   Bipolar disorder Brother    Anxiety disorder Brother    Post-traumatic stress disorder Brother    Diabetes Maternal Grandmother    Heart Problems Maternal Grandmother    Anuerysm Maternal Grandfather    Seizures Maternal Grandfather    Cancer Paternal Grandmother        breast   Asthma Daughter    Sleep apnea Son    Cancer Maternal Aunt        breast cancer   Cancer Paternal Aunt    Colon cancer Maternal Uncle 34 - 79   Lung cancer Maternal Uncle     Social History   Socioeconomic History  Marital status: Single    Spouse name: Not on file   Number of children: 3   Years of education: 1   Highest education level: High school graduate  Occupational History   Occupation: Seeking disability  Tobacco Use   Smoking status: Never    Passive exposure: Never   Smokeless tobacco: Never  Vaping Use   Vaping status: Never Used  Substance and Sexual Activity   Alcohol use: No   Drug use: Not Currently    Types: Marijuana   Sexual activity: Not Currently    Birth control/protection: Surgical    Comment: hyst  Other Topics Concern   Not on file  Social History Narrative   Lives at home with father.   Right-handed.   No daily caffeine per day.   Social Drivers of Corporate investment banker Strain: High Risk (03/06/2023)   Overall Financial Resource Strain (CARDIA)    Difficulty of Paying Living Expenses: Very hard  Food Insecurity: Food Insecurity Present (03/06/2023)   Hunger Vital Sign    Worried About Running Out of Food in the Last Year: Sometimes true    Ran Out of Food in the Last Year: Sometimes  true  Transportation Needs: No Transportation Needs (03/06/2023)   PRAPARE - Administrator, Civil Service (Medical): No    Lack of Transportation (Non-Medical): No  Physical Activity: Sufficiently Active (03/06/2023)   Exercise Vital Sign    Days of Exercise per Week: 7 days    Minutes of Exercise per Session: 30 min  Stress: No Stress Concern Present (03/06/2023)   [employer] of Occupational Health - Occupational Stress Questionnaire    Feeling of Stress : Only a little  Recent Concern: Stress - Stress Concern Present (01/30/2023)   [employer] of Occupational Health - Occupational Stress Questionnaire    Feeling of Stress : Very much  Social Connections: Moderately Isolated (03/06/2023)   Social Connection and Isolation Panel [NHANES]    Frequency of Communication with Friends and Family: More than three times a week    Frequency of Social Gatherings with Friends and Family: Three times a week    Attends Religious Services: More than 4 times per year    Active Member of Clubs or Organizations: No    Attends Banker Meetings: Never    Marital Status: Divorced  Catering manager Violence: Not At Risk (03/06/2023)   Humiliation, Afraid, Rape, and Kick questionnaire    Fear of Current or Ex-Partner: No    Emotionally Abused: No    Physically Abused: No    Sexually Abused: No    Outpatient Medications Prior to Visit  Medication Sig Dispense Refill   albuterol (VENTOLIN HFA) 108 (90 Base) MCG/ACT inhaler Inhale 2 puffs into the lungs every 6 (six) hours as needed for wheezing or shortness of breath. 18 g 5   Atogepant (QULIPTA) 10 MG TABS Take 10 mg by mouth daily. 30 tablet 3   azithromycin (ZITHROMAX Z-PAK) 250 MG tablet Take 2 tablets today. Then take one tablet each day for 4 days starting tomorrow. 6 tablet 0   celecoxib (CELEBREX) 200 MG capsule Take 1 capsule (200 mg total) by mouth 2 (two) times daily. 60 capsule 0   Cholecalciferol (VITAMIN D3)  1000 units CAPS Take 3,000 Units by mouth daily.     citalopram (CELEXA) 20 MG tablet Take 1 tablet (20 mg total) by mouth daily. For depression 30 tablet 5   cyclobenzaprine (FLEXERIL) 5 MG  tablet Take 1 tablet (5 mg total) by mouth 3 (three) times daily as needed for muscle spasms. 30 tablet 1   dapagliflozin propanediol (FARXIGA) 10 MG TABS tablet Take 1 tablet (10 mg total) by mouth daily before breakfast. 90 tablet 1   DULERA 100-5 MCG/ACT AERO INHALE 2 PUFFS TWICE DAILY 13 g 0   eszopiclone (LUNESTA) 1 MG TABS tablet Take 1 tablet (1 mg total) by mouth at bedtime as needed for sleep. Take immediately before bedtime 30 tablet 0   fluticasone (FLONASE) 50 MCG/ACT nasal spray Place 1 spray into both nostrils daily. For allergies 16 g 0   hydrOXYzine (VISTARIL) 25 MG capsule Take 1 capsule (25 mg total) by mouth every 8 (eight) hours as needed. For anxiety 60 capsule 2   ketoconazole (NIZORAL) 2 % cream Apply 1 Application topically daily. 15 g 0   levocetirizine (XYZAL) 5 MG tablet Take 1 tablet (5 mg total) by mouth every evening. 90 tablet 3   losartan (COZAAR) 100 MG tablet 100 mg one daily 30 tablet 4   melatonin 3 MG TABS tablet Take 10 mg by mouth at bedtime.     ondansetron (ZOFRAN-ODT) 4 MG disintegrating tablet TAKE ONE TABLET EVERY 8 HOURS AS NEEDED FOR NAUSEA AND VOMITING 20 tablet 0   oxybutynin (DITROPAN-XL) 10 MG 24 hr tablet Take 1 tablet (10 mg total) by mouth at bedtime. 30 tablet 11   pregabalin (LYRICA) 75 MG capsule TAKE ONE CAPSULE THREE TIMES DAILY for nerve pain 90 capsule 2   torsemide (DEMADEX) 20 MG tablet Take 1 tablet (20 mg total) by mouth daily. For swelling 30 tablet 2   Vitamin D, Ergocalciferol, (DRISDOL) 1.25 MG (50000 UNIT) CAPS capsule Take 1 capsule (50,000 Units total) by mouth every 7 (seven) days. 20 capsule 1   omeprazole (PRILOSEC) 40 MG capsule Take 40 mg by mouth daily.     Semaglutide-Weight Management (WEGOVY) 0.25 MG/0.5ML SOAJ Inject 0.25 mg into  the skin once a week. 2 mL 0   No facility-administered medications prior to visit.    Allergies  Allergen Reactions   Buprenorphine Hcl Anaphylaxis    "it stops my heart"   Morphine Anaphylaxis    i quit breathing    Morphine And Codeine Anaphylaxis    "it stops my heart"   Penicillins Anaphylaxis and Other (See Comments)    Has patient had a PCN reaction causing immediate rash, facial/tongue/throat swelling, SOB or lightheadedness with hypotension: Yes Has patient had a PCN reaction causing severe rash involving mucus membranes or skin necrosis: Yes Has patient had a PCN reaction that required hospitalization: Yes Has patient had a PCN reaction occurring within the last 10 years: No If all of the above answers are "NO", then may proceed with Cephalosporin use.    Gabapentin Hives and Other (See Comments)    Per patient "hallucination"   Nsaids    Sulfa Antibiotics     Other reaction(s): Unknown   Ibuprofen-Acetaminophen Nausea And Vomiting   Latex Rash   Tylenol [Acetaminophen] Nausea And Vomiting    ROS Review of Systems  Constitutional:  Negative for chills and fever.  Eyes:  Negative for visual disturbance.  Respiratory:  Negative for chest tightness and shortness of breath.   Neurological:  Negative for dizziness and headaches.      Objective:    Physical Exam Constitutional:      Appearance: She is obese.  HENT:     Head: Normocephalic.  Mouth/Throat:     Mouth: Mucous membranes are moist.  Cardiovascular:     Rate and Rhythm: Normal rate.     Heart sounds: Normal heart sounds.  Pulmonary:     Effort: Pulmonary effort is normal.     Breath sounds: Normal breath sounds.  Neurological:     Mental Status: She is alert.     BP (!) 131/90 (BP Location: Right Arm, Patient Position: Sitting, Cuff Size: Large)   Pulse 79   Ht 5\' 9"  (1.753 m)   Wt (!) 353 lb (160.1 kg)   SpO2 95%   BMI 52.13 kg/m  Wt Readings from Last 3 Encounters:  10/18/23 (!)  353 lb (160.1 kg)  10/02/23 (!) 353 lb 9.6 oz (160.4 kg)  09/11/23 (!) 357 lb 1.9 oz (162 kg)    Lab Results  Component Value Date   TSH 1.270 06/18/2023   Lab Results  Component Value Date   WBC 7.9 07/10/2023   HGB 12.3 07/10/2023   HCT 38.0 07/10/2023   MCV 88.2 07/10/2023   PLT 228 07/10/2023   Lab Results  Component Value Date   NA 139 07/10/2023   K 3.7 07/10/2023   CO2 29 07/10/2023   GLUCOSE 90 07/10/2023   BUN 12 07/10/2023   CREATININE 0.99 07/10/2023   BILITOT 0.6 07/10/2023   ALKPHOS 89 07/10/2023   AST 20 07/10/2023   ALT 28 07/10/2023   PROT 7.6 07/10/2023   ALBUMIN 4.1 07/10/2023   CALCIUM 9.0 07/10/2023   ANIONGAP 6 07/10/2023   EGFR 80 06/18/2023   Lab Results  Component Value Date   CHOL 167 06/18/2023   Lab Results  Component Value Date   HDL 55 06/18/2023   Lab Results  Component Value Date   LDLCALC 99 06/18/2023   Lab Results  Component Value Date   TRIG 66 06/18/2023   Lab Results  Component Value Date   CHOLHDL 3.0 06/18/2023   Lab Results  Component Value Date   HGBA1C 5.2 06/18/2023      Assessment & Plan:  Type 2 diabetes mellitus without complication, without long-term current use of insulin (HCC) Assessment & Plan: I recommended reducing his intake of high-sugar foods and beverages and increasing physical activity to 150 minutes of moderate intensity per week, as tolerated  Lab Results  Component Value Date   HGBA1C 5.2 06/18/2023      Primary hypertension Assessment & Plan: Encouraged to continue torsemide 20 mg daily  A low-sodium diet with increased physical activity is also recommended. A low-sodium diet of less than 2,300 mg daily is recommended, along with moderate-intensity physical activity for at least 150 minutes per week. The patient is encouraged to maintain these lifestyle modifications to help manage her blood pressure effectively.  Long-term considerations were discussed, emphasizing that  uncontrolled hypertension increases the risk of cardiovascular diseases, including stroke, coronary artery disease, and heart failure.  The patient is encouraged to seek emergency care if blood pressure exceeds 180/120 and is accompanied by symptoms such as headaches, chest pain, palpitations, blurred vision, or dizziness. She verbalized understanding and will follow up as scheduled.       GERD without esophagitis Assessment & Plan: Stable GERD diet encouraged  Orders: -     Omeprazole; Take 1 capsule (40 mg total) by mouth daily.  Dispense: 60 capsule; Refill: 1  Morbid obesity (HCC) Assessment & Plan: The patient reports starting Wegovy 0.25 mg weekly last month and has since lost 4 pounds. No  complaints were voiced. A prescription for Wegovy 0.5 mg weekly will be sent, and the patient is encouraged to implement lifestyle modifications, including increased physical activity, to support weight management.  For optimal results with weight loss, I recommend:  Decreasing portion sizes. Reducing sugar, sodium, and carbohydrate intake, and limiting saturated fats in your diet. Increasing your fiber intake by incorporating more whole grains, fruits, and vegetables. Setting healthy goals and focusing on lowering carbs, sugar, and fat. Increasing the variety of fruits and vegetables in your diet. Reducing soda consumption and limiting processed foods. In addition to taking your weight loss medication, engage in moderate-intensity physical activity for at least 150 minutes per week for the best results.   Orders: -     Wegovy; Inject 0.5 mg into the skin once a week.  Dispense: 2 mL; Refill: 0  IFG (impaired fasting glucose) -     Hemoglobin A1c  Vitamin D deficiency -     VITAMIN D 25 Hydroxy (Vit-D Deficiency, Fractures)  TSH (thyroid-stimulating hormone deficiency) -     TSH + free T4  Other hyperlipidemia -     Lipid panel -     CMP14+EGFR -     CBC with  Differential/Platelet  Note: This chart has been completed using Engineer, civil (consulting) software, and while attempts have been made to ensure accuracy, certain words and phrases may not be transcribed as intended.    Follow-up: Return in about 4 months (around 02/15/2024).   Gilmore Laroche, FNP

## 2023-10-18 NOTE — Patient Instructions (Addendum)
 I appreciate the opportunity to provide care to you today!    Follow up:  4 months  Labs: please stop by the lab today to get your blood drawn (CBC, CMP, TSH, Lipid profile, HgA1c, Vit D)  For optimal results with weight loss, I recommend:  Decreasing portion sizes. Reducing sugar, sodium, and carbohydrate intake, and limiting saturated fats in your diet. Increasing your fiber intake by incorporating more whole grains, fruits, and vegetables. Setting healthy goals and focusing on lowering carbs, sugar, and fat. Increasing the variety of fruits and vegetables in your diet. Reducing soda consumption and limiting processed foods. In addition to taking your weight loss medication, engage in moderate-intensity physical activity for at least 150 minutes per week for the best results.    Attached with your AVS, you will find valuable resources for self-education. I highly recommend dedicating some time to thoroughly examine them.   Please continue to a heart-healthy diet and increase your physical activities. Try to exercise for at least five days a week.    It was a pleasure to see you and I look forward to continuing to work together on your health and well-being. Please do not hesitate to call the office if you need care or have questions about your care.  In case of emergency, please visit the Emergency Department for urgent care, or contact our clinic at [phone] to schedule an appointment. We're here to help you!   Have a wonderful day and week. With Gratitude, Gilmore Laroche MSN, FNP-BC

## 2023-10-18 NOTE — Telephone Encounter (Signed)
Therapist attempted to contact patient via phone regarding scheduled in office appointment and received voicemail recording.  Therapist left message indicating attempt and requesting patient call office. 

## 2023-10-19 LAB — CMP14+EGFR
ALT: 22 [IU]/L (ref 0–32)
AST: 18 [IU]/L (ref 0–40)
Albumin: 4.4 g/dL (ref 3.9–4.9)
Alkaline Phosphatase: 121 [IU]/L (ref 44–121)
BUN/Creatinine Ratio: 9 (ref 9–23)
BUN: 10 mg/dL (ref 6–24)
Bilirubin Total: 0.5 mg/dL (ref 0.0–1.2)
CO2: 25 mmol/L (ref 20–29)
Calcium: 9.3 mg/dL (ref 8.7–10.2)
Chloride: 106 mmol/L (ref 96–106)
Creatinine, Ser: 1.08 mg/dL — ABNORMAL HIGH (ref 0.57–1.00)
Globulin, Total: 2.7 g/dL (ref 1.5–4.5)
Glucose: 87 mg/dL (ref 70–99)
Potassium: 4.3 mmol/L (ref 3.5–5.2)
Sodium: 144 mmol/L (ref 134–144)
Total Protein: 7.1 g/dL (ref 6.0–8.5)
eGFR: 65 mL/min/{1.73_m2} (ref >59)

## 2023-10-19 LAB — CBC WITH DIFFERENTIAL/PLATELET
Basophils Absolute: 0 10*3/uL (ref 0.0–0.2)
Basos: 1 %
EOS (ABSOLUTE): 0.1 10*3/uL (ref 0.0–0.4)
Eos: 2 %
Hematocrit: 39.1 % (ref 34.0–46.6)
Hemoglobin: 12.9 g/dL (ref 11.1–15.9)
Immature Grans (Abs): 0 10*3/uL (ref 0.0–0.1)
Immature Granulocytes: 0 %
Lymphocytes Absolute: 1.7 10*3/uL (ref 0.7–3.1)
Lymphs: 28 %
MCH: 28.1 pg (ref 26.6–33.0)
MCHC: 33 g/dL (ref 31.5–35.7)
MCV: 85 fL (ref 79–97)
Monocytes Absolute: 0.3 10*3/uL (ref 0.1–0.9)
Monocytes: 6 %
Neutrophils Absolute: 3.8 10*3/uL (ref 1.4–7.0)
Neutrophils: 63 %
Platelets: 210 10*3/uL (ref 150–450)
RBC: 4.59 x10E6/uL (ref 3.77–5.28)
RDW: 13.4 % (ref 11.7–15.4)
WBC: 5.9 10*3/uL (ref 3.4–10.8)

## 2023-10-19 LAB — HEMOGLOBIN A1C
Est. average glucose Bld gHb Est-mCnc: 103 mg/dL
Hgb A1c MFr Bld: 5.2 % (ref 4.8–5.6)

## 2023-10-19 LAB — TSH+FREE T4
Free T4: 1.44 ng/dL (ref 0.82–1.77)
TSH: 1.11 u[IU]/mL (ref 0.450–4.500)

## 2023-10-19 LAB — LIPID PANEL
Chol/HDL Ratio: 2.9 {ratio} (ref 0.0–4.4)
Cholesterol, Total: 145 mg/dL (ref 100–199)
HDL: 50 mg/dL (ref >39)
LDL Chol Calc (NIH): 84 mg/dL (ref 0–99)
Triglycerides: 51 mg/dL (ref 0–149)
VLDL Cholesterol Cal: 11 mg/dL (ref 5–40)

## 2023-10-19 LAB — VITAMIN D 25 HYDROXY (VIT D DEFICIENCY, FRACTURES): Vit D, 25-Hydroxy: 38.9 ng/mL (ref 30.0–100.0)

## 2023-10-20 DIAGNOSIS — Z419 Encounter for procedure for purposes other than remedying health state, unspecified: Secondary | ICD-10-CM | POA: Diagnosis not present

## 2023-10-23 ENCOUNTER — Other Ambulatory Visit: Payer: Self-pay | Admitting: Family Medicine

## 2023-10-23 DIAGNOSIS — I10 Essential (primary) hypertension: Secondary | ICD-10-CM

## 2023-10-25 ENCOUNTER — Encounter: Payer: Self-pay | Admitting: Family Medicine

## 2023-10-25 ENCOUNTER — Other Ambulatory Visit: Payer: Self-pay | Admitting: Family Medicine

## 2023-10-25 MED ORDER — WEGOVY 0.25 MG/0.5ML ~~LOC~~ SOAJ: 0.5000 mg | SUBCUTANEOUS | 0 refills | Status: DC

## 2023-10-29 ENCOUNTER — Other Ambulatory Visit: Payer: Self-pay | Admitting: Family Medicine

## 2023-10-29 DIAGNOSIS — R110 Nausea: Secondary | ICD-10-CM

## 2023-10-30 ENCOUNTER — Other Ambulatory Visit (HOSPITAL_COMMUNITY): Payer: Self-pay

## 2023-10-30 ENCOUNTER — Telehealth: Payer: Self-pay | Admitting: Pharmacy Technician

## 2023-10-30 NOTE — Telephone Encounter (Signed)
 Pharmacy Patient Advocate Encounter   Received notification from Onbase that prior authorization for [facility] 0.25MG /0.5ML PEN is required/requested.   Insurance verification completed.   The patient is insured through [facility] [place] .   Per test claim: REFILL TOO SOON. LAST FILLED 10/18/2023. NO FURTHER PA NEEDED AT THIS TIME.

## 2023-10-31 ENCOUNTER — Other Ambulatory Visit: Payer: Self-pay | Admitting: Family Medicine

## 2023-11-01 ENCOUNTER — Ambulatory Visit (HOSPITAL_COMMUNITY): Payer: 59 | Admitting: Psychiatry

## 2023-11-01 ENCOUNTER — Telehealth (HOSPITAL_COMMUNITY): Payer: Self-pay | Admitting: Psychiatry

## 2023-11-01 NOTE — Telephone Encounter (Signed)
 Therapist called patient regarding scheduled in office appointment.  Patient reports miscommunication regarding appointment time.  Patient agrees to attend next appointment on 11/15/2023.

## 2023-11-10 ENCOUNTER — Other Ambulatory Visit: Payer: Self-pay | Admitting: Family Medicine

## 2023-11-15 ENCOUNTER — Ambulatory Visit (HOSPITAL_COMMUNITY): Payer: 59 | Admitting: Psychiatry

## 2023-11-29 ENCOUNTER — Ambulatory Visit (HOSPITAL_COMMUNITY): Payer: 59 | Admitting: Psychiatry

## 2023-11-29 ENCOUNTER — Telehealth (HOSPITAL_COMMUNITY): Payer: Self-pay | Admitting: Psychiatry

## 2023-11-29 ENCOUNTER — Encounter (HOSPITAL_COMMUNITY): Payer: Self-pay | Admitting: Psychiatry

## 2023-11-29 NOTE — Telephone Encounter (Signed)
Therapist attempted to contact patient via phone regarding scheduled in office appointment and received voicemail recording.  Therapist left message indicating attempt and requesting patient call office. 

## 2023-12-01 DIAGNOSIS — Z419 Encounter for procedure for purposes other than remedying health state, unspecified: Secondary | ICD-10-CM | POA: Diagnosis not present

## 2023-12-06 ENCOUNTER — Other Ambulatory Visit: Payer: Self-pay | Admitting: Family Medicine

## 2023-12-06 MED ORDER — WEGOVY 1 MG/0.5ML ~~LOC~~ SOAJ: 1.0000 mg | SUBCUTANEOUS | 0 refills | Status: DC

## 2023-12-06 NOTE — Progress Notes (Unsigned)
 Wegovy

## 2023-12-18 ENCOUNTER — Other Ambulatory Visit: Payer: Self-pay | Admitting: Family Medicine

## 2023-12-18 DIAGNOSIS — R110 Nausea: Secondary | ICD-10-CM

## 2023-12-27 DIAGNOSIS — H5213 Myopia, bilateral: Secondary | ICD-10-CM | POA: Diagnosis not present

## 2023-12-31 ENCOUNTER — Other Ambulatory Visit: Payer: Self-pay | Admitting: Family Medicine

## 2023-12-31 DIAGNOSIS — Z419 Encounter for procedure for purposes other than remedying health state, unspecified: Secondary | ICD-10-CM | POA: Diagnosis not present

## 2023-12-31 MED ORDER — WEGOVY 1.7 MG/0.75ML ~~LOC~~ SOAJ
1.7000 mg | SUBCUTANEOUS | 0 refills | Status: DC
Start: 2023-12-31 — End: 2024-01-24

## 2024-01-04 ENCOUNTER — Ambulatory Visit: Payer: Self-pay | Admitting: Family Medicine

## 2024-01-09 ENCOUNTER — Ambulatory Visit (HOSPITAL_COMMUNITY): Admitting: Psychiatry

## 2024-01-16 ENCOUNTER — Ambulatory Visit: Payer: Self-pay

## 2024-01-16 NOTE — Telephone Encounter (Signed)
 Patient scheduled.
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
## 2024-01-17 ENCOUNTER — Encounter: Payer: Self-pay | Admitting: Internal Medicine

## 2024-01-17 ENCOUNTER — Ambulatory Visit (INDEPENDENT_AMBULATORY_CARE_PROVIDER_SITE_OTHER): Payer: Self-pay | Admitting: Internal Medicine

## 2024-01-17 VITALS — BP 130/88 | HR 60 | Ht 69.0 in | Wt 339.4 lb

## 2024-01-17 DIAGNOSIS — G894 Chronic pain syndrome: Secondary | ICD-10-CM | POA: Diagnosis not present

## 2024-01-17 DIAGNOSIS — F411 Generalized anxiety disorder: Secondary | ICD-10-CM | POA: Insufficient documentation

## 2024-01-17 DIAGNOSIS — F4320 Adjustment disorder, unspecified: Secondary | ICD-10-CM | POA: Insufficient documentation

## 2024-01-17 DIAGNOSIS — F331 Major depressive disorder, recurrent, moderate: Secondary | ICD-10-CM | POA: Diagnosis not present

## 2024-01-17 DIAGNOSIS — G8929 Other chronic pain: Secondary | ICD-10-CM | POA: Insufficient documentation

## 2024-01-17 DIAGNOSIS — M51362 Other intervertebral disc degeneration, lumbar region with discogenic back pain and lower extremity pain: Secondary | ICD-10-CM | POA: Diagnosis not present

## 2024-01-17 DIAGNOSIS — M25522 Pain in left elbow: Secondary | ICD-10-CM

## 2024-01-17 MED ORDER — HYDROXYZINE PAMOATE 25 MG PO CAPS: 25.0000 mg | ORAL_CAPSULE | Freq: Three times a day (TID) | ORAL | 2 refills | Status: DC | PRN

## 2024-01-17 MED ORDER — CITALOPRAM HYDROBROMIDE 20 MG PO TABS: 20.0000 mg | ORAL_TABLET | Freq: Every day | ORAL | 5 refills | Status: AC

## 2024-01-17 MED ORDER — BUSPIRONE HCL 7.5 MG PO TABS: 7.5000 mg | ORAL_TABLET | Freq: Two times a day (BID) | ORAL | 1 refills | Status: DC

## 2024-01-17 NOTE — Assessment & Plan Note (Signed)
 Uncontrolled due to grief reaction On citalopram  20 mg once daily Added BuSpar for anxiety, can also help with insomnia

## 2024-01-17 NOTE — Assessment & Plan Note (Addendum)
 GAD7: 19 Uncontrolled, likely worse due to grief reaction Added BuSpar 7.5 mg BID Continue escitalopram 20 mg once daily Vistaril  as needed for anxiety Referred to [facility] therapy  Patient and/or legal guardian verbally consented to [facility] services about presenting concerns and psychiatric consultation as appropriate.  The services will be billed as appropriate for the patient

## 2024-01-17 NOTE — Patient Instructions (Signed)
 Please start taking Buspirone as prescribed for anxiety.  Please continue to take Citalopram  as prescribed.

## 2024-01-17 NOTE — Progress Notes (Signed)
 Established Patient Office Visit  Subjective:  Patient ID: Mary [name], female    DOB: 29-Jul-1981  Age: 43 y.o. MRN: [id number]  CC:  Chief Complaint  Patient presents with   Depression    Depression/anxiety    HPI Mary [name] is a 43 y.o. female with past medical history of HTN, COPD, type II DM, GAD and DDD of lumbar spine who presents for f/u of MDD and GAD.  Grief reaction, MDD and GAD: She lost her father about a week ago.  She has noticed worsening of her anxiety since then.  She has insomnia and episodes of nervous breakdown.  She has auditory hallucinations of his father walking in her home.  She currently takes citalopram  20 mg QD and hydroxyzine  25 mg as needed for anxiety.  Denies SI or HI currently.  DDD of lumbar spine: She used to take oxycodone  for chronic pain syndrome, mainly due to DJD of lumbar spine.  She also has history of chronic left elbow pain, had left elbow surgery in 2022. She has not been able to see pain specialist for the last 1 year.  Past Medical History:  Diagnosis Date   Allergy    Anxiety    Asthma    Bipolar 1 disorder (HCC)    Bulging lumbar disc    BV (bacterial vaginosis) 02/18/2018   Cancer of cervix (HCC)    Carpal tunnel syndrome on right    Cervical cancer (HCC)    Chronic back pain    COPD (chronic obstructive pulmonary disease) (HCC)    Depression    Hypertension    Migraines    Physiological ovarian cysts    Plantar fasciitis    PTSD (post-traumatic stress disorder)    Rheumatoid arthritis (HCC)    S/P partial hysterectomy    Vaginal Pap smear, abnormal     Past Surgical History:  Procedure Laterality Date   ABDOMINAL HYSTERECTOMY     ABDOMINAL HYSTERECTOMY     COLONOSCOPY WITH PROPOFOL  N/A 04/10/2023   Procedure: COLONOSCOPY WITH PROPOFOL ;  Surgeon: Urban Garden, MD;  Location: [facility];  Service: Gastroenterology;  Laterality: N/A;  9:00 am, asa 3, pt knows to arrive at 8:30   EYE  SURGERY  age 35   Artificial right eye   ORIF ELBOW FRACTURE Left 11/18/2019   Procedure: OPEN REDUCTION WITH REPAIR VERSUS FRAGMENT EXCISION CAPITELLUM FRACTURE LEFT ELBOW AND LIGAMENTOUS RECONSTRUCTION AS NECESSARY;  Surgeon: Ronn Cohn, MD;  Location: [facility];  Service: Orthopedics;  Laterality: Left;  2 HRS   POLYPECTOMY  04/10/2023   Procedure: POLYPECTOMY;  Surgeon: Urban Garden, MD;  Location: [facility];  Service: Gastroenterology;;    Family History  Problem Relation Age of Onset   Stroke Mother        TIA   Asthma Mother    Cancer Mother        leukemia   Other Mother        blood platelets are dropping   Heart attack Father    Hypertension Father    Cancer Father        skin cancer   Stroke Father    Colon polyps Father    Fibromyalgia Sister    Cancer Sister        cervical   Cancer Sister        cervical   Cancer Brother        thyroid  cancer   Bipolar disorder Brother  Anxiety disorder Brother    Post-traumatic stress disorder Brother    Diabetes Maternal Grandmother    Heart Problems Maternal Grandmother    Anuerysm Maternal Grandfather    Seizures Maternal Grandfather    Cancer Paternal Grandmother        breast   Asthma Daughter    Sleep apnea Son    Cancer Maternal Aunt        breast cancer   Cancer Paternal Aunt    Colon cancer Maternal Uncle 65 - 79   Lung cancer Maternal Uncle     Social History   Socioeconomic History   Marital status: Single    Spouse name: Not on file   Number of children: 3   Years of education: 12   Highest education level: High school graduate  Occupational History   Occupation: Seeking disability  Tobacco Use   Smoking status: Never    Passive exposure: Never   Smokeless tobacco: Never  Vaping Use   Vaping status: Never Used  Substance and Sexual Activity   Alcohol use: No   Drug use: Not Currently    Types: Marijuana   Sexual activity: Not Currently    Birth control/protection:  Surgical    Comment: hyst  Other Topics Concern   Not on file  Social History Narrative   Lives at home with father.   Right-handed.   No daily caffeine per day.   Social Drivers of Corporate investment banker Strain: High Risk (03/06/2023)   Overall Financial Resource Strain (CARDIA)    Difficulty of Paying Living Expenses: Very hard  Food Insecurity: Food Insecurity Present (03/06/2023)   Hunger Vital Sign    Worried About Running Out of Food in the Last Year: Sometimes true    Ran Out of Food in the Last Year: Sometimes true  Transportation Needs: No Transportation Needs (03/06/2023)   PRAPARE - Administrator, Civil Service (Medical): No    Lack of Transportation (Non-Medical): No  Physical Activity: Sufficiently Active (03/06/2023)   Exercise Vital Sign    Days of Exercise per Week: 7 days    Minutes of Exercise per Session: 30 min  Stress: No Stress Concern Present (03/06/2023)   [employer] of Occupational Health - Occupational Stress Questionnaire    Feeling of Stress : Only a little  Recent Concern: Stress - Stress Concern Present (01/30/2023)   [employer] of Occupational Health - Occupational Stress Questionnaire    Feeling of Stress : Very much  Social Connections: Moderately Isolated (03/06/2023)   Social Connection and Isolation Panel [NHANES]    Frequency of Communication with Friends and Family: More than three times a week    Frequency of Social Gatherings with Friends and Family: Three times a week    Attends Religious Services: More than 4 times per year    Active Member of Clubs or Organizations: No    Attends Banker Meetings: Never    Marital Status: Divorced  Catering manager Violence: Not At Risk (03/06/2023)   Humiliation, Afraid, Rape, and Kick questionnaire    Fear of Current or Ex-Partner: No    Emotionally Abused: No    Physically Abused: No    Sexually Abused: No    Outpatient Medications Prior to Visit   Medication Sig Dispense Refill   albuterol  (VENTOLIN  HFA) 108 (90 Base) MCG/ACT inhaler Inhale 2 puffs into the lungs every 6 (six) hours as needed for wheezing or shortness of breath. 18 g 5  fluticasone  (FLONASE ) 50 MCG/ACT nasal spray Place 1 spray into both nostrils daily. For allergies 16 g 0   losartan  (COZAAR ) 100 MG tablet TAKE ONE TABLET DAILY 30 tablet 4   omeprazole  (PRILOSEC) 40 MG capsule Take 1 capsule (40 mg total) by mouth daily. 60 capsule 1   ondansetron  (ZOFRAN -ODT) 4 MG disintegrating tablet DISSOLVE ONE TABLET BY MOUTH EVERY 8 HOURS AS NEEDED FOR NAUSEA AND VOMITING 20 tablet 0   oxybutynin  (DITROPAN -XL) 10 MG 24 hr tablet Take 1 tablet (10 mg total) by mouth at bedtime. 30 tablet 11   Semaglutide -Weight Management (WEGOVY ) 1.7 MG/0.75ML SOAJ Inject 1.7 mg into the skin once a week. 3 mL 0   torsemide  (DEMADEX ) 20 MG tablet Take 1 tablet (20 mg total) by mouth daily. For swelling 30 tablet 2   Vitamin D , Ergocalciferol , (DRISDOL ) 1.25 MG (50000 UNIT) CAPS capsule Take 1 capsule (50,000 Units total) by mouth every 7 (seven) days. 20 capsule 1   Atogepant  (QULIPTA ) 10 MG TABS Take 10 mg by mouth daily. 30 tablet 3   Cholecalciferol (VITAMIN D3) 1000 units CAPS Take 3,000 Units by mouth daily.     citalopram  (CELEXA ) 20 MG tablet Take 1 tablet (20 mg total) by mouth daily. For depression 30 tablet 5   hydrOXYzine  (VISTARIL ) 25 MG capsule Take 1 capsule (25 mg total) by mouth every 8 (eight) hours as needed. For anxiety 60 capsule 2   celecoxib  (CELEBREX ) 200 MG capsule Take 1 capsule (200 mg total) by mouth 2 (two) times daily. (Patient not taking: Reported on 01/17/2024) 60 capsule 0   azithromycin  (ZITHROMAX  Z-PAK) 250 MG tablet Take 2 tablets today. Then take one tablet each day for 4 days starting tomorrow. 6 tablet 0   cyclobenzaprine  (FLEXERIL ) 5 MG tablet Take 1 tablet (5 mg total) by mouth 3 (three) times daily as needed for muscle spasms. 30 tablet 1   dapagliflozin   propanediol (FARXIGA ) 10 MG TABS tablet Take 1 tablet (10 mg total) by mouth daily before breakfast. 90 tablet 1   DULERA  100-5 MCG/ACT AERO INHALE 2 PUFFS TWICE DAILY 13 g 0   eszopiclone  (LUNESTA ) 1 MG TABS tablet Take 1 tablet (1 mg total) by mouth at bedtime as needed for sleep. Take immediately before bedtime 30 tablet 0   ketoconazole  (NIZORAL ) 2 % cream Apply 1 Application topically daily. 15 g 0   levocetirizine (XYZAL ) 5 MG tablet Take 1 tablet (5 mg total) by mouth every evening. 90 tablet 3   melatonin 3 MG TABS tablet Take 10 mg by mouth at bedtime.     pregabalin  (LYRICA ) 75 MG capsule TAKE ONE CAPSULE THREE TIMES DAILY for nerve pain 90 capsule 2   No facility-administered medications prior to visit.    Allergies  Allergen Reactions   Buprenorphine Hcl Anaphylaxis    "it stops my heart"   Morphine  Anaphylaxis    i quit breathing    Morphine  And Codeine  Anaphylaxis    "it stops my heart"   Penicillins Anaphylaxis and Other (See Comments)    Has patient had a PCN reaction causing immediate rash, facial/tongue/throat swelling, SOB or lightheadedness with hypotension: Yes Has patient had a PCN reaction causing severe rash involving mucus membranes or skin necrosis: Yes Has patient had a PCN reaction that required hospitalization: Yes Has patient had a PCN reaction occurring within the last 10 years: No If all of the above answers are "NO", then may proceed with Cephalosporin use.    Gabapentin Hives  and Other (See Comments)    Per patient "hallucination"   Nsaids    Sulfa  Antibiotics     Other reaction(s): Unknown   Ibuprofen -Acetaminophen  Nausea And Vomiting   Latex Rash   Tylenol  [Acetaminophen ] Nausea And Vomiting    ROS Review of Systems  Constitutional:  Negative for chills and fever.  HENT:  Negative for congestion and sore throat.   Eyes:  Negative for pain and discharge.  Respiratory:  Negative for cough and shortness of breath.   Cardiovascular:  Negative  for chest pain and palpitations.  Gastrointestinal:  Negative for abdominal pain, diarrhea, nausea and vomiting.  Endocrine: Negative for polydipsia and polyuria.  Genitourinary:  Negative for dysuria and hematuria.  Musculoskeletal:  Positive for arthralgias and back pain. Negative for neck pain and neck stiffness.  Skin:  Negative for rash.  Neurological:  Negative for dizziness and weakness.  Psychiatric/Behavioral:  Positive for agitation, decreased concentration, dysphoric mood and sleep disturbance. Negative for behavioral problems. The patient is nervous/anxious.       Objective:     Physical Exam Vitals reviewed.  Constitutional:      General: She is not in acute distress.    Appearance: She is obese. She is not diaphoretic.  HENT:     Head: Normocephalic and atraumatic.     Nose: Nose normal. No congestion.     Mouth/Throat:     Mouth: Mucous membranes are moist.     Pharynx: No posterior oropharyngeal erythema.  Eyes:     General: No scleral icterus.    Extraocular Movements: Extraocular movements intact.  Cardiovascular:     Rate and Rhythm: Normal rate and regular rhythm.     Heart sounds: Normal heart sounds. No murmur heard. Pulmonary:     Breath sounds: Normal breath sounds. No wheezing or rales.  Musculoskeletal:     Cervical back: Neck supple. No tenderness.     Right lower leg: No edema.     Left lower leg: No edema.  Skin:    General: Skin is warm.     Findings: No rash.  Neurological:     General: No focal deficit present.     Mental Status: She is alert and oriented to person, place, and time.  Psychiatric:        Mood and Affect: Mood is anxious.        Behavior: Behavior is cooperative.     BP 130/88 (BP Location: Left Arm)   Pulse 60   Ht 5\' 9"  (1.753 m)   Wt (!) 339 lb 6.4 oz (154 kg)   SpO2 98%   BMI 50.12 kg/m  Wt Readings from Last 3 Encounters:  01/17/24 (!) 339 lb 6.4 oz (154 kg)  10/18/23 (!) 353 lb (160.1 kg)  10/02/23 (!) 353  lb 9.6 oz (160.4 kg)    Lab Results  Component Value Date   TSH 1.110 10/18/2023   Lab Results  Component Value Date   WBC 5.9 10/18/2023   HGB 12.9 10/18/2023   HCT 39.1 10/18/2023   MCV 85 10/18/2023   PLT 210 10/18/2023   Lab Results  Component Value Date   NA 144 10/18/2023   K 4.3 10/18/2023   CO2 25 10/18/2023   GLUCOSE 87 10/18/2023   BUN 10 10/18/2023   CREATININE 1.08 (H) 10/18/2023   BILITOT 0.5 10/18/2023   ALKPHOS 121 10/18/2023   AST 18 10/18/2023   ALT 22 10/18/2023   PROT 7.1 10/18/2023   ALBUMIN 4.4  10/18/2023   CALCIUM 9.3 10/18/2023   ANIONGAP 6 07/10/2023   EGFR 65 10/18/2023   Lab Results  Component Value Date   CHOL 145 10/18/2023   Lab Results  Component Value Date   HDL 50 10/18/2023   Lab Results  Component Value Date   LDLCALC 84 10/18/2023   Lab Results  Component Value Date   TRIG 51 10/18/2023   Lab Results  Component Value Date   CHOLHDL 2.9 10/18/2023   Lab Results  Component Value Date   HGBA1C 5.2 10/18/2023      Assessment & Plan:   Problem List Items Addressed This Visit       Musculoskeletal and Integument   Degeneration of intervertebral disc of lumbar region with discogenic back pain and lower extremity pain   Chronic low back pain, has history of lumbar DDD Previous x-ray of lumbar spine reviewed from chart Used to be on oxycodone  in the past Referred to [facility] pain clinic       Relevant Orders   Ambulatory referral to Pain Clinic     Other   Chronic pain syndrome   Due to chronic low back pain and left elbow pain Was on chronic opioid medications Referred to [facility] pain clinic      Relevant Medications   citalopram  (CELEXA ) 20 MG tablet   Other Relevant Orders   Ambulatory referral to Pain Clinic   Moderate episode of recurrent major depressive disorder (HCC) - Primary   Uncontrolled due to grief reaction On citalopram  20 mg once daily Added BuSpar for anxiety, can also help with  insomnia      Relevant Medications   busPIRone (BUSPAR) 7.5 MG tablet   citalopram  (CELEXA ) 20 MG tablet   hydrOXYzine  (VISTARIL ) 25 MG capsule   Other Relevant Orders   Ambulatory referral to Psychology   GAD (generalized anxiety disorder)   GAD7: 21 Uncontrolled, likely worse due to grief reaction Added BuSpar 7.5 mg BID Continue escitalopram 20 mg once daily Vistaril  as needed for anxiety Referred to [facility] therapy  Patient and/or legal guardian verbally consented to [facility] services about presenting concerns and psychiatric consultation as appropriate.  The services will be billed as appropriate for the patient      Relevant Medications   busPIRone (BUSPAR) 7.5 MG tablet   citalopram  (CELEXA ) 20 MG tablet   hydrOXYzine  (VISTARIL ) 25 MG capsule   Other Relevant Orders   Ambulatory referral to Psychology   Chronic elbow pain, left   Has a history of left elbow sprain, and history of left elbow surgery for fracture Referred to pain clinic for chronic pain management       Relevant Medications   citalopram  (CELEXA ) 20 MG tablet   Other Relevant Orders   Ambulatory referral to Pain Clinic   Grief reaction   Recently lost her father about a week ago Grief counseling done today Referred to [facility] therapy for further evaluation      Relevant Orders   Ambulatory referral to Psychology    Meds ordered this encounter  Medications   busPIRone (BUSPAR) 7.5 MG tablet    Sig: Take 1 tablet (7.5 mg total) by mouth 2 (two) times daily.    Dispense:  60 tablet    Refill:  1   citalopram  (CELEXA ) 20 MG tablet    Sig: Take 1 tablet (20 mg total) by mouth daily. For depression    Dispense:  30 tablet    Refill:  5  hydrOXYzine  (VISTARIL ) 25 MG capsule    Sig: Take 1 capsule (25 mg total) by mouth every 8 (eight) hours as needed. For anxiety    Dispense:  60 capsule    Refill:  2    Follow-up: Return in about 2 months (around 03/18/2024) for GAD and  MDD.    Meldon Sport, MD

## 2024-01-17 NOTE — Assessment & Plan Note (Signed)
 Recently lost her father about a week ago Grief counseling done today Referred to [facility] therapy for further evaluation

## 2024-01-17 NOTE — Assessment & Plan Note (Signed)
 Due to chronic low back pain and left elbow pain Was on chronic opioid medications Referred to [facility] pain clinic

## 2024-01-17 NOTE — Assessment & Plan Note (Signed)
 Chronic low back pain, has history of lumbar DDD Previous x-ray of lumbar spine reviewed from chart Used to be on oxycodone  in the past Referred to [facility] pain clinic

## 2024-01-17 NOTE — Assessment & Plan Note (Signed)
 Has a history of left elbow sprain, and history of left elbow surgery for fracture Referred to pain clinic for chronic pain management

## 2024-01-23 ENCOUNTER — Ambulatory Visit (HOSPITAL_COMMUNITY): Admitting: Psychiatry

## 2024-01-24 ENCOUNTER — Other Ambulatory Visit: Payer: Self-pay | Admitting: Family Medicine

## 2024-01-24 MED ORDER — WEGOVY 2.4 MG/0.75ML ~~LOC~~ SOAJ: 2.4000 mg | SUBCUTANEOUS | 0 refills | Status: DC

## 2024-01-28 ENCOUNTER — Telehealth: Payer: Self-pay | Admitting: Pharmacy Technician

## 2024-01-28 ENCOUNTER — Other Ambulatory Visit (HOSPITAL_COMMUNITY): Payer: Self-pay

## 2024-01-28 ENCOUNTER — Telehealth: Payer: Self-pay | Admitting: Family Medicine

## 2024-01-28 NOTE — Telephone Encounter (Signed)
 Pharmacy Patient Advocate Encounter   Received notification from CoverMyMeds that prior authorization for Wegovy  2.4MG /0.75ML auto-injectors is required/requested.   Insurance verification completed.   The patient is insured through [employer] / IT consultant .   Per test claim: PA required; PA submitted to above mentioned insurance via CoverMyMeds Key/confirmation #/EOC [id number] Status is pending

## 2024-01-28 NOTE — Telephone Encounter (Signed)
 Copied from CRM [id number]. Topic: Referral - Status >> Jan 28, 2024  3:20 PM Felizardo Hotter wrote: Reason for CRM: Pt called regarding referral# [id number], she would like it sent to [facility] [address] [place], [place] [address] ph: [phone].

## 2024-01-28 NOTE — Telephone Encounter (Signed)
 Pharmacy Patient Advocate Encounter  Received notification from [employer] that Prior Authorization for Wegovy  2.4MG /0.75ML auto-injectors has been DENIED.  Full denial letter will be uploaded to the media tab. See denial reason below.   PA #/Case ID/Reference #: Key: [id number]  Patient has an active PA on file under her secondary insurance [facility] Medicaid) no further PA needed at this time. Pt still has access to medication for a copay of $4.00.

## 2024-01-30 NOTE — Telephone Encounter (Signed)
 Referral sent to [facility] as Patient requested.  MyChart message also sent to Patient with Specialty Office contact information.

## 2024-01-31 DIAGNOSIS — Z419 Encounter for procedure for purposes other than remedying health state, unspecified: Secondary | ICD-10-CM | POA: Diagnosis not present

## 2024-02-01 ENCOUNTER — Ambulatory Visit: Admitting: Professional Counselor

## 2024-02-01 DIAGNOSIS — F331 Major depressive disorder, recurrent, moderate: Secondary | ICD-10-CM
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
## 2024-02-04 NOTE — Patient Instructions (Signed)
 If your symptoms worsen or you have thoughts of suicide/homicide, PLEASE SEEK IMMEDIATE MEDICAL ATTENTION.  You may always call:   National Suicide Hotline: 988 or (913)195-1115 [place] Crisis Line: 343-675-8477 Crisis Recovery in [place]: [phone]     These are available 24 hours a day, 7 days a week.

## 2024-02-06 ENCOUNTER — Ambulatory Visit (HOSPITAL_COMMUNITY): Admitting: Psychiatry

## 2024-02-11 DIAGNOSIS — R29818 Other symptoms and signs involving the nervous system: Secondary | ICD-10-CM | POA: Diagnosis not present

## 2024-02-11 DIAGNOSIS — M549 Dorsalgia, unspecified: Secondary | ICD-10-CM | POA: Diagnosis not present

## 2024-02-11 DIAGNOSIS — Z79899 Other long term (current) drug therapy: Secondary | ICD-10-CM | POA: Diagnosis not present

## 2024-02-11 DIAGNOSIS — M129 Arthropathy, unspecified: Secondary | ICD-10-CM | POA: Diagnosis not present

## 2024-02-11 DIAGNOSIS — Z131 Encounter for screening for diabetes mellitus: Secondary | ICD-10-CM | POA: Diagnosis not present

## 2024-02-11 DIAGNOSIS — Z1159 Encounter for screening for other viral diseases: Secondary | ICD-10-CM | POA: Diagnosis not present

## 2024-02-15 ENCOUNTER — Ambulatory Visit: Admitting: Professional Counselor

## 2024-02-15 ENCOUNTER — Telehealth (INDEPENDENT_AMBULATORY_CARE_PROVIDER_SITE_OTHER): Payer: Self-pay | Admitting: Professional Counselor

## 2024-02-15 DIAGNOSIS — F331 Major depressive disorder, recurrent, moderate: Secondary | ICD-10-CM
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
## 2024-02-18 ENCOUNTER — Encounter: Payer: Self-pay | Admitting: Family Medicine

## 2024-02-18 ENCOUNTER — Other Ambulatory Visit (HOSPITAL_COMMUNITY): Payer: Self-pay | Admitting: Adult Health

## 2024-02-18 ENCOUNTER — Other Ambulatory Visit (HOSPITAL_COMMUNITY): Payer: Self-pay

## 2024-02-18 ENCOUNTER — Telehealth: Payer: Self-pay | Admitting: Pharmacy Technician

## 2024-02-18 ENCOUNTER — Ambulatory Visit (INDEPENDENT_AMBULATORY_CARE_PROVIDER_SITE_OTHER): Payer: Medicaid Other | Admitting: Family Medicine

## 2024-02-18 VITALS — BP 142/101 | HR 67 | Resp 18 | Ht 69.0 in | Wt 335.0 lb

## 2024-02-18 DIAGNOSIS — Z9181 History of falling: Secondary | ICD-10-CM | POA: Insufficient documentation

## 2024-02-18 DIAGNOSIS — G4733 Obstructive sleep apnea (adult) (pediatric): Secondary | ICD-10-CM | POA: Diagnosis not present

## 2024-02-18 DIAGNOSIS — I10 Essential (primary) hypertension: Secondary | ICD-10-CM

## 2024-02-18 DIAGNOSIS — E038 Other specified hypothyroidism: Secondary | ICD-10-CM | POA: Diagnosis not present

## 2024-02-18 DIAGNOSIS — E7849 Other hyperlipidemia: Secondary | ICD-10-CM | POA: Diagnosis not present

## 2024-02-18 DIAGNOSIS — E559 Vitamin D deficiency, unspecified: Secondary | ICD-10-CM

## 2024-02-18 DIAGNOSIS — Z1231 Encounter for screening mammogram for malignant neoplasm of breast: Secondary | ICD-10-CM

## 2024-02-18 DIAGNOSIS — E876 Hypokalemia: Secondary | ICD-10-CM | POA: Diagnosis not present

## 2024-02-18 DIAGNOSIS — R7301 Impaired fasting glucose: Secondary | ICD-10-CM

## 2024-02-18 MED ORDER — POTASSIUM CHLORIDE CRYS ER 10 MEQ PO TBCR: 10.0000 meq | EXTENDED_RELEASE_TABLET | Freq: Two times a day (BID) | ORAL | 1 refills | Status: DC

## 2024-02-18 MED ORDER — OLMESARTAN-AMLODIPINE-HCTZ 40-5-25 MG PO TABS: 1.0000 | ORAL_TABLET | Freq: Every day | ORAL | 1 refills | Status: DC

## 2024-02-18 MED ORDER — TIRZEPATIDE-WEIGHT MANAGEMENT 2.5 MG/0.5ML ~~LOC~~ SOLN: 2.5000 mg | SUBCUTANEOUS | 0 refills | Status: DC

## 2024-02-18 NOTE — Assessment & Plan Note (Signed)
 The patient is requesting a walk-in shower for her home, reporting that she has fallen twice this year while attempting to get into the shower. She was informed that Medicaid does not cover the installation of walk-in showers, and she is advised to contact her caseworker for further assistance and to explore potential resources or support programs.

## 2024-02-18 NOTE — Progress Notes (Signed)
 Established Patient Office Visit  Subjective:  Patient ID: Mary [name], female    DOB: 05/20/1981  Age: 43 y.o. MRN: [id number]  CC:  Chief Complaint  Patient presents with   Hypertension    4 month follow up    Weight Gain    Concerns about weight gain     HPI [name] is a 43 y.o. female with past medical history of hypertension, type II diabetes, migraine headaches presents for f/u of  chronic medical conditions.  For the details of today's visit, please refer to the assessment and plan.    Past Medical History:  Diagnosis Date   Allergy    Anxiety    Asthma    Bipolar 1 disorder (HCC)    Bulging lumbar disc    BV (bacterial vaginosis) 02/18/2018   Cancer of cervix (HCC)    Carpal tunnel syndrome on right    Cervical cancer (HCC)    Chronic back pain    COPD (chronic obstructive pulmonary disease) (HCC)    Depression    Hypertension    Migraines    Physiological ovarian cysts    Plantar fasciitis    PTSD (post-traumatic stress disorder)    Rheumatoid arthritis (HCC)    S/P partial hysterectomy    Vaginal Pap smear, abnormal     Past Surgical History:  Procedure Laterality Date   ABDOMINAL HYSTERECTOMY     ABDOMINAL HYSTERECTOMY     COLONOSCOPY WITH PROPOFOL  N/A 04/10/2023   Procedure: COLONOSCOPY WITH PROPOFOL ;  Surgeon: Eartha Angelia Sieving, MD;  Location: [facility];  Service: Gastroenterology;  Laterality: N/A;  9:00 am, asa 3, pt knows to arrive at 8:30   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   ORIF ELBOW FRACTURE Left 11/18/2019   Procedure: OPEN REDUCTION WITH REPAIR VERSUS FRAGMENT EXCISION CAPITELLUM FRACTURE LEFT ELBOW AND LIGAMENTOUS RECONSTRUCTION AS NECESSARY;  Surgeon: Camella Fallow, MD;  Location: [facility];  Service: Orthopedics;  Laterality: Left;  2 HRS   POLYPECTOMY  04/10/2023   Procedure: POLYPECTOMY;  Surgeon: Eartha Angelia Sieving, MD;  Location: [facility];  Service: Gastroenterology;;    Family History   Problem Relation Age of Onset   Stroke Mother        TIA   Asthma Mother    Cancer Mother        leukemia   Other Mother        blood platelets are dropping   Heart attack Father    Hypertension Father    Cancer Father        skin cancer   Stroke Father    Colon polyps Father    Fibromyalgia Sister    Cancer Sister        cervical   Cancer Sister        cervical   Cancer Brother        thyroid  cancer   Bipolar disorder Brother    Anxiety disorder Brother    Post-traumatic stress disorder Brother    Diabetes Maternal Grandmother    Heart Problems Maternal Grandmother    Anuerysm Maternal Grandfather    Seizures Maternal Grandfather    Cancer Paternal Grandmother        breast   Asthma Daughter    Sleep apnea Son    Cancer Maternal Aunt        breast cancer   Cancer Paternal Aunt    Colon cancer Maternal Uncle 68 - 79  Lung cancer Maternal Uncle     Social History   Socioeconomic History   Marital status: Single    Spouse name: Not on file   Number of children: 3   Years of education: 12   Highest education level: High school graduate  Occupational History   Occupation: Seeking disability  Tobacco Use   Smoking status: Never    Passive exposure: Never   Smokeless tobacco: Never  Vaping Use   Vaping status: Never Used  Substance and Sexual Activity   Alcohol use: No   Drug use: Not Currently    Types: Marijuana   Sexual activity: Not Currently    Birth control/protection: Surgical    Comment: hyst  Other Topics Concern   Not on file  Social History Narrative   Lives at home with father.   Right-handed.   No daily caffeine per day.   Social Drivers of Corporate investment banker Strain: High Risk (03/06/2023)   Overall Financial Resource Strain (CARDIA)    Difficulty of Paying Living Expenses: Very hard  Food Insecurity: Food Insecurity Present (03/06/2023)   Hunger Vital Sign    Worried About Running Out of Food in the Last Year: Sometimes  true    Ran Out of Food in the Last Year: Sometimes true  Transportation Needs: No Transportation Needs (03/06/2023)   PRAPARE - Administrator, Civil Service (Medical): No    Lack of Transportation (Non-Medical): No  Physical Activity: Sufficiently Active (03/06/2023)   Exercise Vital Sign    Days of Exercise per Week: 7 days    Minutes of Exercise per Session: 30 min  Stress: No Stress Concern Present (03/06/2023)   [employer] of Occupational Health - Occupational Stress Questionnaire    Feeling of Stress : Only a little  Recent Concern: Stress - Stress Concern Present (01/30/2023)   [employer] of Occupational Health - Occupational Stress Questionnaire    Feeling of Stress : Very much  Social Connections: Moderately Isolated (03/06/2023)   Social Connection and Isolation Panel    Frequency of Communication with Friends and Family: More than three times a week    Frequency of Social Gatherings with Friends and Family: Three times a week    Attends Religious Services: More than 4 times per year    Active Member of Clubs or Organizations: No    Attends Banker Meetings: Never    Marital Status: Divorced  Catering manager Violence: Not At Risk (03/06/2023)   Humiliation, Afraid, Rape, and Kick questionnaire    Fear of Current or Ex-Partner: No    Emotionally Abused: No    Physically Abused: No    Sexually Abused: No    Outpatient Medications Prior to Visit  Medication Sig Dispense Refill   albuterol  (VENTOLIN  HFA) 108 (90 Base) MCG/ACT inhaler Inhale 2 puffs into the lungs every 6 (six) hours as needed for wheezing or shortness of breath. 18 g 5   busPIRone  (BUSPAR ) 7.5 MG tablet Take 1 tablet (7.5 mg total) by mouth 2 (two) times daily. 60 tablet 1   citalopram  (CELEXA ) 20 MG tablet Take 1 tablet (20 mg total) by mouth daily. For depression 30 tablet 5   fluticasone  (FLONASE ) 50 MCG/ACT nasal spray Place 1 spray into both nostrils daily. For  allergies 16 g 0   hydrOXYzine  (VISTARIL ) 25 MG capsule Take 1 capsule (25 mg total) by mouth every 8 (eight) hours as needed. For anxiety 60 capsule 2   omeprazole  (  PRILOSEC) 40 MG capsule Take 1 capsule (40 mg total) by mouth daily. 60 capsule 1   ondansetron  (ZOFRAN -ODT) 4 MG disintegrating tablet DISSOLVE ONE TABLET BY MOUTH EVERY 8 HOURS AS NEEDED FOR NAUSEA AND VOMITING 20 tablet 0   oxybutynin  (DITROPAN -XL) 10 MG 24 hr tablet Take 1 tablet (10 mg total) by mouth at bedtime. 30 tablet 11   oxyCODONE  (OXY IR/ROXICODONE ) 5 MG immediate release tablet Take 5 mg by mouth every 6 (six) hours as needed.     Vitamin D , Ergocalciferol , (DRISDOL ) 1.25 MG (50000 UNIT) CAPS capsule Take 1 capsule (50,000 Units total) by mouth every 7 (seven) days. 20 capsule 1   losartan  (COZAAR ) 100 MG tablet TAKE ONE TABLET DAILY 30 tablet 4   Semaglutide -Weight Management (WEGOVY ) 2.4 MG/0.75ML SOAJ Inject 2.4 mg into the skin once a week. 9 mL 0   torsemide  (DEMADEX ) 20 MG tablet Take 1 tablet (20 mg total) by mouth daily. For swelling 30 tablet 2   celecoxib  (CELEBREX ) 200 MG capsule Take 1 capsule (200 mg total) by mouth 2 (two) times daily. (Patient not taking: Reported on 02/18/2024) 60 capsule 0   No facility-administered medications prior to visit.    Allergies  Allergen Reactions   Buprenorphine Hcl Anaphylaxis    it stops my heart   Morphine  Anaphylaxis    i quit breathing    Morphine  And Codeine  Anaphylaxis    it stops my heart   Penicillins Anaphylaxis and Other (See Comments)    Has patient had a PCN reaction causing immediate rash, facial/tongue/throat swelling, SOB or lightheadedness with hypotension: Yes Has patient had a PCN reaction causing severe rash involving mucus membranes or skin necrosis: Yes Has patient had a PCN reaction that required hospitalization: Yes Has patient had a PCN reaction occurring within the last 10 years: No If all of the above answers are NO, then may proceed  with Cephalosporin use.    Gabapentin Hives and Other (See Comments)    Per patient hallucination   Nsaids    Sulfa  Antibiotics     Other reaction(s): Unknown   Ibuprofen -Acetaminophen  Nausea And Vomiting   Latex Rash   Tylenol  [Acetaminophen ] Nausea And Vomiting    ROS Review of Systems  Constitutional:  Negative for chills and fever.  Eyes:  Negative for visual disturbance.  Respiratory:  Negative for chest tightness and shortness of breath.   Neurological:  Negative for dizziness and headaches.      Objective:    Physical Exam HENT:     Head: Normocephalic.     Mouth/Throat:     Mouth: Mucous membranes are moist.   Cardiovascular:     Rate and Rhythm: Normal rate.     Heart sounds: Normal heart sounds.  Pulmonary:     Effort: Pulmonary effort is normal.     Breath sounds: Normal breath sounds.   Neurological:     Mental Status: She is alert.     BP (!) 142/101   Pulse 67   Resp 18   Ht 5' 9 (1.753 m)   Wt (!) 335 lb (152 kg)   SpO2 96%   BMI 49.47 kg/m  Wt Readings from Last 3 Encounters:  02/18/24 (!) 335 lb (152 kg)  01/17/24 (!) 339 lb 6.4 oz (154 kg)  10/18/23 (!) 353 lb (160.1 kg)    Lab Results  Component Value Date   TSH 1.110 10/18/2023   Lab Results  Component Value Date   WBC 5.9 10/18/2023  HGB 12.9 10/18/2023   HCT 39.1 10/18/2023   MCV 85 10/18/2023   PLT 210 10/18/2023   Lab Results  Component Value Date   NA 144 10/18/2023   K 4.3 10/18/2023   CO2 25 10/18/2023   GLUCOSE 87 10/18/2023   BUN 10 10/18/2023   CREATININE 1.08 (H) 10/18/2023   BILITOT 0.5 10/18/2023   ALKPHOS 121 10/18/2023   AST 18 10/18/2023   ALT 22 10/18/2023   PROT 7.1 10/18/2023   ALBUMIN 4.4 10/18/2023   CALCIUM 9.3 10/18/2023   ANIONGAP 6 07/10/2023   EGFR 65 10/18/2023   Lab Results  Component Value Date   CHOL 145 10/18/2023   Lab Results  Component Value Date   HDL 50 10/18/2023   Lab Results  Component Value Date   LDLCALC 84  10/18/2023   Lab Results  Component Value Date   TRIG 51 10/18/2023   Lab Results  Component Value Date   CHOLHDL 2.9 10/18/2023   Lab Results  Component Value Date   HGBA1C 5.2 10/18/2023      Assessment & Plan:  Primary hypertension Assessment & Plan: The patient's blood pressure is uncontrolled today in the clinic. She is currently taking torsemide  20 mg daily and losartan  100 mg daily. She reports experiencing constant headaches over the past week; it is worth noting that she has a history of migraines. She denies symptoms of chest pain, palpitations, or shortness of breath.  To optimize blood pressure control, we will discontinue torsemide  and losartan  and initiate therapy with olmesartan-amlodipine-hydrochlorothiazide 40-5-25 mg daily.  In addition, the patient is encouraged to take potassium chloride 10 mEq twice daily to help prevent potential hypokalemia caused by the diuretic component of the new regimen.  A low-sodium diet with increased physical activity is also recommended. A low-sodium diet of less than 2,300 mg daily is recommended, along with moderate-intensity physical activity for at least 150 minutes per week. The patient is encouraged to maintain these lifestyle modifications to help manage her blood pressure effectively.  Long-term considerations were discussed, emphasizing that uncontrolled hypertension increases the risk of cardiovascular diseases, including stroke, coronary artery disease, and heart failure.  The patient is encouraged to seek emergency care if blood pressure exceeds 180/120 and is accompanied by symptoms such as headaches, chest pain, palpitations, blurred vision, or dizziness. She verbalized understanding and will follow up as scheduled.      Orders: -     Olmesartan-amLODIPine-HCTZ; Take 1 tablet by mouth daily.  Dispense: 30 tablet; Refill: 1 -     Microalbumin / creatinine urine ratio -     EKG 12-Lead  Hypokalemia -     Potassium  Chloride Crys ER; Take 1 tablet (10 mEq total) by mouth 2 (two) times daily.  Dispense: 60 tablet; Refill: 1  Severe obstructive sleep apnea -     Tirzepatide -Weight Management; Inject 2.5 mg into the skin once a week.  Dispense: 2 mL; Refill: 0  Morbid obesity (HCC) Assessment & Plan: The patient has been on Wegovy  2.4 mg weekly but reports that she has not experienced the significant weight loss she had hoped for, despite implementing lifestyle changes. She has expressed interest in switching to Zepbound  for weight management. The patient also has a history of obstructive sleep apnea.  She is instructed that, after completing her current dose of Wegovy , she may begin Zepbound  2.5 mg weekly. She is encouraged to request medication refills on a monthly basis to ensure continuity of treatment.  Orders: -     Tirzepatide -Weight Management; Inject 2.5 mg into the skin once a week.  Dispense: 2 mL; Refill: 0  At high risk for falls Assessment & Plan: The patient is requesting a walk-in shower for her home, reporting that she has fallen twice this year while attempting to get into the shower. She was informed that Medicaid does not cover the installation of walk-in showers, and she is advised to contact her caseworker for further assistance and to explore potential resources or support programs.    IFG (impaired fasting glucose) -     Hemoglobin A1c  Vitamin D  deficiency -     VITAMIN D  25 Hydroxy (Vit-D Deficiency, Fractures)  TSH (thyroid -stimulating hormone deficiency) -     TSH + free T4  Other hyperlipidemia -     Lipid panel -     CMP14+EGFR -     CBC with Differential/Platelet  Note: This chart has been completed using Engineer, civil (consulting) software, and while attempts have been made to ensure accuracy, certain words and phrases may not be transcribed as intended.    Follow-up: Return in about 1 month (around 03/19/2024) for BP.   Kamaya Keckler, FNP

## 2024-02-18 NOTE — Assessment & Plan Note (Signed)
 The patient has been on Wegovy  2.4 mg weekly but reports that she has not experienced the significant weight loss she had hoped for, despite implementing lifestyle changes. She has expressed interest in switching to Zepbound  for weight management. The patient also has a history of obstructive sleep apnea.  She is instructed that, after completing her current dose of Wegovy , she may begin Zepbound  2.5 mg weekly. She is encouraged to request medication refills on a monthly basis to ensure continuity of treatment.

## 2024-02-18 NOTE — Assessment & Plan Note (Signed)
 The patient's blood pressure is uncontrolled today in the clinic. She is currently taking torsemide  20 mg daily and losartan  100 mg daily. She reports experiencing constant headaches over the past week; it is worth noting that she has a history of migraines. She denies symptoms of chest pain, palpitations, or shortness of breath.  To optimize blood pressure control, we will discontinue torsemide  and losartan  and initiate therapy with olmesartan-amlodipine-hydrochlorothiazide 40-5-25 mg daily.  In addition, the patient is encouraged to take potassium chloride 10 mEq twice daily to help prevent potential hypokalemia caused by the diuretic component of the new regimen.  A low-sodium diet with increased physical activity is also recommended. A low-sodium diet of less than 2,300 mg daily is recommended, along with moderate-intensity physical activity for at least 150 minutes per week. The patient is encouraged to maintain these lifestyle modifications to help manage her blood pressure effectively.  Long-term considerations were discussed, emphasizing that uncontrolled hypertension increases the risk of cardiovascular diseases, including stroke, coronary artery disease, and heart failure.  The patient is encouraged to seek emergency care if blood pressure exceeds 180/120 and is accompanied by symptoms such as headaches, chest pain, palpitations, blurred vision, or dizziness. She verbalized understanding and will follow up as scheduled.

## 2024-02-18 NOTE — Patient Instructions (Addendum)
 I appreciate the opportunity to provide care to you today!    Follow up:  1 months BP  Labs: please stop by the lab today to get your blood drawn (CBC, CMP, TSH, Lipid profile, HgA1c, Vit D)  Schedule diabetic eye exam  Hypertension Management  Your current blood pressure is above the target goal of <140/90 mmHg. To help manage this, please start taking olmesartan-amlodipine-hydrochlorothiazide 40-5-25 mg daily. In addition, take potassium chloride 10 mEq twice daily to help counteract potential hypokalemia caused by the diuretic component of your blood pressure medication.  Medication Instructions: Take your blood pressure medication at the same time each day. After taking your medication, check your blood pressure at least an hour later. If your first reading is >140/90 mmHg, wait at least 10 minutes and recheck your blood pressure. Side Effects: In the initial days of therapy, you may experience dizziness or lightheadedness as your body adjusts to the lower blood pressure; this is expected. Diet and Lifestyle: Adhere to a low-sodium diet, limiting intake to less than 1500 mg daily, and increase your physical activity. Avoid over-the-counter NSAIDs such as ibuprofen  and naproxen  while on this medication. Hydration and Nutrition: Stay well-hydrated by drinking at least 64 ounces of water daily. Increase your servings of fruits and vegetables and avoid excessive sodium in your diet. Long-Term Considerations: Uncontrolled hypertension can increase the risk of cardiovascular diseases, including stroke, coronary artery disease, and heart failure.  Please report to the emergency department if your blood pressure exceeds 180/120 and is accompanied by symptoms such as headaches, chest pain, palpitations, blurred vision, or dizziness.   For weight management:  -After completing your current dose of Wegovy , you may begin taking Zepbound  2.5 mg weekly. Please request medication refills on a monthly  basis  Please follow up if your symptoms worsen or fail to improve..   Please continue to a heart-healthy diet and increase your physical activities. Try to exercise for at least five days a week.    It was a pleasure to see you and I look forward to continuing to work together on your health and well-being. Please do not hesitate to call the office if you need care or have questions about your care.  In case of emergency, please visit the Emergency Department for urgent care, or contact our clinic at [phone] to schedule an appointment. We're here to help you!   Have a wonderful day and week. With Gratitude, Dartha Rozzell MSN, FNP-BC

## 2024-02-18 NOTE — Telephone Encounter (Signed)
 Pharmacy Patient Advocate Encounter   Received notification from CoverMyMeds that prior authorization for Olmesartan-amLODIPine-HCTZ 40-5-25MG  tablets is required/requested.   Insurance verification completed.   The patient is insured through [employer] .   Per test claim: PA required; PA submitted to above mentioned insurance via CoverMyMeds Key/confirmation #/EOC [facility] Status is pending

## 2024-02-19 ENCOUNTER — Other Ambulatory Visit (HOSPITAL_COMMUNITY): Payer: Self-pay

## 2024-02-19 ENCOUNTER — Telehealth: Payer: Self-pay | Admitting: Pharmacy Technician

## 2024-02-19 LAB — CMP14+EGFR
ALT: 22 IU/L (ref 0–32)
AST: 24 IU/L (ref 0–40)
Albumin: 4.5 g/dL (ref 3.9–4.9)
Alkaline Phosphatase: 125 IU/L — ABNORMAL HIGH (ref 44–121)
BUN/Creatinine Ratio: 7 — ABNORMAL LOW (ref 9–23)
BUN: 8 mg/dL (ref 6–24)
Bilirubin Total: 0.7 mg/dL (ref 0.0–1.2)
CO2: 24 mmol/L (ref 20–29)
Calcium: 9.3 mg/dL (ref 8.7–10.2)
Chloride: 102 mmol/L (ref 96–106)
Creatinine, Ser: 1.11 mg/dL — ABNORMAL HIGH (ref 0.57–1.00)
Globulin, Total: 2.7 g/dL (ref 1.5–4.5)
Glucose: 83 mg/dL (ref 70–99)
Potassium: 4.2 mmol/L (ref 3.5–5.2)
Sodium: 141 mmol/L (ref 134–144)
Total Protein: 7.2 g/dL (ref 6.0–8.5)
eGFR: 63 mL/min/{1.73_m2} (ref >59)

## 2024-02-19 LAB — CBC WITH DIFFERENTIAL/PLATELET
Basophils Absolute: 0 10*3/uL (ref 0.0–0.2)
Basos: 0 %
EOS (ABSOLUTE): 0.1 10*3/uL (ref 0.0–0.4)
Eos: 1 %
Hematocrit: 40.7 % (ref 34.0–46.6)
Hemoglobin: 13.2 g/dL (ref 11.1–15.9)
Immature Grans (Abs): 0 10*3/uL (ref 0.0–0.1)
Immature Granulocytes: 0 %
Lymphocytes Absolute: 1.5 10*3/uL (ref 0.7–3.1)
Lymphs: 23 %
MCH: 28.8 pg (ref 26.6–33.0)
MCHC: 32.4 g/dL (ref 31.5–35.7)
MCV: 89 fL (ref 79–97)
Monocytes Absolute: 0.4 10*3/uL (ref 0.1–0.9)
Monocytes: 5 %
Neutrophils Absolute: 4.7 10*3/uL (ref 1.4–7.0)
Neutrophils: 71 %
Platelets: 196 10*3/uL (ref 150–450)
RBC: 4.59 x10E6/uL (ref 3.77–5.28)
RDW: 14.2 % (ref 11.7–15.4)
WBC: 6.7 10*3/uL (ref 3.4–10.8)

## 2024-02-19 LAB — HEMOGLOBIN A1C
Est. average glucose Bld gHb Est-mCnc: 97 mg/dL
Hgb A1c MFr Bld: 5 % (ref 4.8–5.6)

## 2024-02-19 LAB — TSH+FREE T4
Free T4: 1.38 ng/dL (ref 0.82–1.77)
TSH: 1.56 u[IU]/mL (ref 0.450–4.500)

## 2024-02-19 LAB — LIPID PANEL
Chol/HDL Ratio: 3.3 ratio (ref 0.0–4.4)
Cholesterol, Total: 153 mg/dL (ref 100–199)
HDL: 47 mg/dL (ref >39)
LDL Chol Calc (NIH): 92 mg/dL (ref 0–99)
Triglycerides: 71 mg/dL (ref 0–149)
VLDL Cholesterol Cal: 14 mg/dL (ref 5–40)

## 2024-02-19 LAB — VITAMIN D 25 HYDROXY (VIT D DEFICIENCY, FRACTURES): Vit D, 25-Hydroxy: 39.5 ng/mL (ref 30.0–100.0)

## 2024-02-19 NOTE — Telephone Encounter (Signed)
 Pharmacy Patient Advocate Encounter  Received notification from [employer] that Prior Authorization for Olmesartan-amLODIPine-HCTZ 40-5-25MG  tablets has been DENIED.  Full denial letter will be uploaded to the media tab. See denial reason below.   PA #/Case ID/Reference #: [id number]    Patient also had [facility] [place] Medicaid as secondary insurance which does cover the medication. Called and spoke with pharmacist and confirmed he did get a paid claim with a $4.00 copay. No additional PA needed as that time.

## 2024-02-19 NOTE — Telephone Encounter (Signed)
 Pharmacy Patient Advocate Encounter   Received notification from CoverMyMeds that prior authorization for Zepbound  2.5MG /0.5ML pen-injectors is required/requested.   Insurance verification completed.   The patient is insured through [facility] [place] .   Per test claim: PA required; PA submitted to above mentioned insurance via CoverMyMeds Key/confirmation #/EOC [id number] Status is pending

## 2024-02-19 NOTE — Telephone Encounter (Signed)
 Pharmacy Patient Advocate Encounter  Received notification from [facility] Medicaid that Prior Authorization for Zepbound  2.5MG /0.5ML pen-injectors has been APPROVED from 02/19/2024 to 08/17/2024. Spoke to pharmacy to process.Copay is $4.00.    PA #/Case ID/Reference #: [id number]

## 2024-02-22 ENCOUNTER — Ambulatory Visit: Payer: Self-pay | Admitting: Family Medicine

## 2024-02-22 NOTE — Progress Notes (Signed)
 Please inform the patient that her lab results are stable. I recommend staying well hydrated by increasing her fluid intake to at least 64 ounces daily.

## 2024-02-25 DIAGNOSIS — Z79899 Other long term (current) drug therapy: Secondary | ICD-10-CM | POA: Diagnosis not present

## 2024-02-25 DIAGNOSIS — R768 Other specified abnormal immunological findings in serum: Secondary | ICD-10-CM | POA: Diagnosis not present

## 2024-02-25 DIAGNOSIS — M47816 Spondylosis without myelopathy or radiculopathy, lumbar region: Secondary | ICD-10-CM | POA: Diagnosis not present

## 2024-02-26 NOTE — Addendum Note (Signed)
 Addended byBETHA TOBIE DOWNS on: 02/26/2024 05:10 PM   Modules accepted: Orders

## 2024-02-28 ENCOUNTER — Other Ambulatory Visit: Payer: Self-pay | Admitting: Family Medicine

## 2024-02-28 ENCOUNTER — Telehealth: Payer: Self-pay | Admitting: Family Medicine

## 2024-02-28 ENCOUNTER — Other Ambulatory Visit (HOSPITAL_COMMUNITY): Payer: Self-pay

## 2024-02-28 ENCOUNTER — Telehealth: Payer: Self-pay | Admitting: Pharmacy Technician

## 2024-02-28 DIAGNOSIS — R110 Nausea: Secondary | ICD-10-CM

## 2024-02-28 NOTE — Telephone Encounter (Signed)
 Copied from CRM [id number]. Topic: Clinical - Medication Prior Auth >> Feb 28, 2024 10:32 AM Carlatta H wrote: Reason for CRM:  ondansetron  (ZOFRAN -ODT) 4 MG disintegrating tablet [[id number]] both needs to be sent to insurance for prior authorization

## 2024-02-28 NOTE — Telephone Encounter (Signed)
 Pharmacy Patient Advocate Encounter   Received notification from Pt Calls Messages that prior authorization for Ondansetron  4mg  ODT tablets is required/requested.   Insurance verification completed.   The patient is insured through [employer] .   Per test claim: Refill too soon. PA is not needed at this time. Medication was filled 02/28/2024. Next eligible fill date is 03/04/2024.

## 2024-02-28 NOTE — Telephone Encounter (Signed)
 PA request has been Received. New Encounter has been or will be created for follow up. For additional info see Pharmacy Prior Auth telephone encounter from 02/28/2024.

## 2024-02-29 ENCOUNTER — Institutional Professional Consult (permissible substitution): Admitting: Professional Counselor

## 2024-03-01 DIAGNOSIS — Z419 Encounter for procedure for purposes other than remedying health state, unspecified: Secondary | ICD-10-CM | POA: Diagnosis not present

## 2024-03-13 ENCOUNTER — Observation Stay (HOSPITAL_COMMUNITY)

## 2024-03-13 ENCOUNTER — Emergency Department (HOSPITAL_COMMUNITY)

## 2024-03-13 ENCOUNTER — Other Ambulatory Visit: Payer: Self-pay

## 2024-03-13 ENCOUNTER — Observation Stay (HOSPITAL_COMMUNITY)
Admission: EM | Admit: 2024-03-13 | Discharge: 2024-03-14 | Disposition: A | Discharge status: Home / Self Care | Attending: Family Medicine | Admitting: Family Medicine

## 2024-03-13 ENCOUNTER — Encounter (HOSPITAL_COMMUNITY): Payer: Self-pay

## 2024-03-13 DIAGNOSIS — Z6841 Body Mass Index (BMI) 40.0 and over, adult: Secondary | ICD-10-CM | POA: Insufficient documentation

## 2024-03-13 DIAGNOSIS — I110 Hypertensive heart disease with heart failure: Secondary | ICD-10-CM | POA: Insufficient documentation

## 2024-03-13 DIAGNOSIS — G8929 Other chronic pain: Secondary | ICD-10-CM | POA: Diagnosis not present

## 2024-03-13 DIAGNOSIS — N3946 Mixed incontinence: Secondary | ICD-10-CM | POA: Insufficient documentation

## 2024-03-13 DIAGNOSIS — G894 Chronic pain syndrome: Secondary | ICD-10-CM | POA: Diagnosis present

## 2024-03-13 DIAGNOSIS — I5032 Chronic diastolic (congestive) heart failure: Secondary | ICD-10-CM | POA: Insufficient documentation

## 2024-03-13 DIAGNOSIS — N179 Acute kidney failure, unspecified: Principal | ICD-10-CM | POA: Insufficient documentation

## 2024-03-13 DIAGNOSIS — E119 Type 2 diabetes mellitus without complications: Secondary | ICD-10-CM | POA: Diagnosis not present

## 2024-03-13 DIAGNOSIS — H54414A Blindness right eye category 4, normal vision left eye: Secondary | ICD-10-CM | POA: Diagnosis not present

## 2024-03-13 DIAGNOSIS — I959 Hypotension, unspecified: Secondary | ICD-10-CM | POA: Insufficient documentation

## 2024-03-13 DIAGNOSIS — F39 Unspecified mood [affective] disorder: Secondary | ICD-10-CM | POA: Diagnosis not present

## 2024-03-13 DIAGNOSIS — M542 Cervicalgia: Secondary | ICD-10-CM | POA: Diagnosis present

## 2024-03-13 DIAGNOSIS — M5412 Radiculopathy, cervical region: Secondary | ICD-10-CM | POA: Diagnosis not present

## 2024-03-13 DIAGNOSIS — F411 Generalized anxiety disorder: Secondary | ICD-10-CM | POA: Diagnosis not present

## 2024-03-13 DIAGNOSIS — H5440 Blindness, one eye, unspecified eye: Secondary | ICD-10-CM | POA: Diagnosis present

## 2024-03-13 DIAGNOSIS — M79602 Pain in left arm: Secondary | ICD-10-CM | POA: Diagnosis not present

## 2024-03-13 DIAGNOSIS — R079 Chest pain, unspecified: Secondary | ICD-10-CM | POA: Diagnosis not present

## 2024-03-13 DIAGNOSIS — I10 Essential (primary) hypertension: Secondary | ICD-10-CM | POA: Diagnosis not present

## 2024-03-13 DIAGNOSIS — K219 Gastro-esophageal reflux disease without esophagitis: Secondary | ICD-10-CM | POA: Insufficient documentation

## 2024-03-13 DIAGNOSIS — I517 Cardiomegaly: Secondary | ICD-10-CM | POA: Diagnosis not present

## 2024-03-13 DIAGNOSIS — M79601 Pain in right arm: Secondary | ICD-10-CM | POA: Diagnosis not present

## 2024-03-13 LAB — HEMOGLOBIN A1C
Hgb A1c MFr Bld: 4.5 % — ABNORMAL LOW (ref 4.8–5.6)
Mean Plasma Glucose: 82.45 mg/dL

## 2024-03-13 LAB — COMPREHENSIVE METABOLIC PANEL WITH GFR
ALT: 26 U/L (ref 0–44)
AST: 21 U/L (ref 15–41)
Albumin: 4.3 g/dL (ref 3.5–5.0)
Alkaline Phosphatase: 104 U/L (ref 38–126)
Anion gap: 9 (ref 5–15)
BUN: 38 mg/dL — ABNORMAL HIGH (ref 6–20)
CO2: 27 mmol/L (ref 22–32)
Calcium: 9.6 mg/dL (ref 8.9–10.3)
Chloride: 102 mmol/L (ref 98–111)
Creatinine, Ser: 2.51 mg/dL — ABNORMAL HIGH (ref 0.44–1.00)
GFR, Estimated: 24 mL/min — ABNORMAL LOW (ref >60)
Glucose, Bld: 97 mg/dL (ref 70–99)
Potassium: 4.7 mmol/L (ref 3.5–5.1)
Sodium: 138 mmol/L (ref 135–145)
Total Bilirubin: 0.8 mg/dL (ref 0.0–1.2)
Total Protein: 7.8 g/dL (ref 6.5–8.1)

## 2024-03-13 LAB — CBC WITH DIFFERENTIAL/PLATELET
Abs Immature Granulocytes: 0.01 K/uL (ref 0.00–0.07)
Basophils Absolute: 0 K/uL (ref 0.0–0.1)
Basophils Relative: 1 %
Eosinophils Absolute: 0.1 K/uL (ref 0.0–0.5)
Eosinophils Relative: 1 %
HCT: 39.9 % (ref 36.0–46.0)
Hemoglobin: 13 g/dL (ref 12.0–15.0)
Immature Granulocytes: 0 %
Lymphocytes Relative: 22 %
Lymphs Abs: 1.6 K/uL (ref 0.7–4.0)
MCH: 29.3 pg (ref 26.0–34.0)
MCHC: 32.6 g/dL (ref 30.0–36.0)
MCV: 89.9 fL (ref 80.0–100.0)
Monocytes Absolute: 0.5 K/uL (ref 0.1–1.0)
Monocytes Relative: 6 %
Neutro Abs: 4.8 K/uL (ref 1.7–7.7)
Neutrophils Relative %: 70 %
Platelets: 184 K/uL (ref 150–400)
RBC: 4.44 MIL/uL (ref 3.87–5.11)
RDW: 13.5 % (ref 11.5–15.5)
WBC: 7 K/uL (ref 4.0–10.5)
nRBC: 0 % (ref 0.0–0.2)

## 2024-03-13 LAB — PHOSPHORUS: Phosphorus: 4 mg/dL (ref 2.5–4.6)

## 2024-03-13 LAB — MAGNESIUM: Magnesium: 2.6 mg/dL — ABNORMAL HIGH (ref 1.7–2.4)

## 2024-03-13 LAB — GLUCOSE, CAPILLARY
Glucose-Capillary: 106 mg/dL — ABNORMAL HIGH (ref 70–99)
Glucose-Capillary: 94 mg/dL (ref 70–99)

## 2024-03-13 MED ORDER — SODIUM CHLORIDE 0.9% FLUSH: 3.0000 mL | Freq: Two times a day (BID) | INTRAVENOUS | Status: DC

## 2024-03-13 MED ORDER — TIZANIDINE HCL 4 MG PO TABS
4.0000 mg | ORAL_TABLET | Freq: Three times a day (TID) | ORAL | Status: DC
  Administered 2024-03-13 – 2024-03-14 (×4): 4 mg via ORAL
  Filled 2024-03-13 (×4): qty 1

## 2024-03-13 MED ORDER — SODIUM CHLORIDE 0.9 % IV BOLUS
1000.0000 mL | Freq: Once | INTRAVENOUS | Status: AC
  Administered 2024-03-13: 1000 mL via INTRAVENOUS

## 2024-03-13 MED ORDER — SODIUM CHLORIDE 0.9% FLUSH
3.0000 mL | Freq: Two times a day (BID) | INTRAVENOUS | Status: DC
  Administered 2024-03-13: 3 mL via INTRAVENOUS

## 2024-03-13 MED ORDER — IPRATROPIUM BROMIDE 0.02 % IN SOLN: 0.5000 mg | Freq: Four times a day (QID) | RESPIRATORY_TRACT | Status: DC | PRN

## 2024-03-13 MED ORDER — OXYCODONE HCL 5 MG PO TABS
5.0000 mg | ORAL_TABLET | ORAL | Status: DC | PRN
  Administered 2024-03-13 (×2): 5 mg via ORAL
  Filled 2024-03-13 (×2): qty 1

## 2024-03-13 MED ORDER — CITALOPRAM HYDROBROMIDE 20 MG PO TABS
20.0000 mg | ORAL_TABLET | Freq: Every day | ORAL | Status: DC
  Administered 2024-03-13 – 2024-03-14 (×2): 20 mg via ORAL
  Filled 2024-03-13 (×2): qty 1

## 2024-03-13 MED ORDER — FLEET ENEMA RE ENEM: 1.0000 | ENEMA | Freq: Once | RECTAL | Status: DC | PRN

## 2024-03-13 MED ORDER — TRAZODONE HCL 50 MG PO TABS
25.0000 mg | ORAL_TABLET | Freq: Every evening | ORAL | Status: DC | PRN
  Administered 2024-03-13: 25 mg via ORAL
  Filled 2024-03-13: qty 1

## 2024-03-13 MED ORDER — OXYBUTYNIN CHLORIDE ER 5 MG PO TB24
10.0000 mg | ORAL_TABLET | Freq: Every day | ORAL | Status: DC
  Administered 2024-03-13: 10 mg via ORAL
  Filled 2024-03-13: qty 2

## 2024-03-13 MED ORDER — SODIUM CHLORIDE 0.9 % IV SOLN: INTRAVENOUS | Status: DC

## 2024-03-13 MED ORDER — BUSPIRONE HCL 15 MG PO TABS
7.5000 mg | ORAL_TABLET | Freq: Two times a day (BID) | ORAL | Status: DC
  Administered 2024-03-14: 7.5 mg via ORAL
  Filled 2024-03-13: qty 1

## 2024-03-13 MED ORDER — HYDROXYZINE PAMOATE 25 MG PO CAPS
25.0000 mg | ORAL_CAPSULE | Freq: Three times a day (TID) | ORAL | Status: DC | PRN
  Filled 2024-03-13: qty 1

## 2024-03-13 MED ORDER — HYDROMORPHONE HCL 1 MG/ML IJ SOLN: 0.5000 mg | INTRAMUSCULAR | Status: DC | PRN

## 2024-03-13 MED ORDER — PANTOPRAZOLE SODIUM 40 MG PO TBEC
40.0000 mg | DELAYED_RELEASE_TABLET | Freq: Every day | ORAL | Status: DC
  Administered 2024-03-13 – 2024-03-14 (×2): 40 mg via ORAL
  Filled 2024-03-13 (×2): qty 1

## 2024-03-13 MED ORDER — BISACODYL 5 MG PO TBEC
5.0000 mg | DELAYED_RELEASE_TABLET | Freq: Every day | ORAL | Status: DC | PRN
Start: 2024-03-13 — End: 2024-03-15

## 2024-03-13 MED ORDER — HEPARIN SODIUM (PORCINE) 5000 UNIT/ML IJ SOLN
5000.0000 [IU] | Freq: Three times a day (TID) | INTRAMUSCULAR | Status: DC
  Administered 2024-03-13 – 2024-03-14 (×3): 5000 [IU] via SUBCUTANEOUS
  Filled 2024-03-13 (×3): qty 1

## 2024-03-13 MED ORDER — ONDANSETRON HCL 4 MG/2ML IJ SOLN: 4.0000 mg | Freq: Four times a day (QID) | INTRAMUSCULAR | Status: DC | PRN

## 2024-03-13 MED ORDER — INSULIN ASPART 100 UNIT/ML IJ SOLN: 0.0000 [IU] | Freq: Three times a day (TID) | INTRAMUSCULAR | Status: DC

## 2024-03-13 MED ORDER — HYDRALAZINE HCL 20 MG/ML IJ SOLN
10.0000 mg | INTRAMUSCULAR | Status: DC | PRN
Start: 2024-03-13 — End: 2024-03-14

## 2024-03-13 MED ORDER — SENNOSIDES-DOCUSATE SODIUM 8.6-50 MG PO TABS
1.0000 | ORAL_TABLET | Freq: Every evening | ORAL | Status: DC | PRN
Start: 2024-03-13 — End: 2024-03-15

## 2024-03-13 MED ORDER — ONDANSETRON HCL 4 MG PO TABS: 4.0000 mg | ORAL_TABLET | Freq: Four times a day (QID) | ORAL | Status: DC | PRN

## 2024-03-13 MED ORDER — LEVALBUTEROL HCL 0.63 MG/3ML IN NEBU
0.6300 mg | INHALATION_SOLUTION | Freq: Four times a day (QID) | RESPIRATORY_TRACT | Status: DC | PRN
Start: 2024-03-13 — End: 2024-03-15

## 2024-03-13 NOTE — Assessment & Plan Note (Signed)
Continue as needed Atarax

## 2024-03-13 NOTE — ED Triage Notes (Signed)
 Pt c/o left shoulder and neck pain. States she has seen her PCP and was referred to pain clinic and she takes pain medication that helps, however this am and recently she has felt more of a nerve pain shooting up through her neck from shoulder area. Hx of HTN, no diabetes, no blood thinners.

## 2024-03-13 NOTE — Assessment & Plan Note (Signed)
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

## 2024-03-13 NOTE — Assessment & Plan Note (Addendum)
-   Patient states that she is not diabetic - Checking CBG q. ACHS, SSI coverage - Last A1c 02/18/2024 was 5.0  - She is on GLP-1 inhibitor or a setting

## 2024-03-13 NOTE — Hospital Course (Signed)
 Mary [name] is a 43 year old female with extensive history of morbid obesity, stress incontinence, GERD, DM2, HTN, anxiety, depression presenting to the ED today with chief complaint of left shoulder and neck pain. Patient was recently seen by PCP who referred patient to pain clinic. Describes the pain as shooting pain down her arm and shoulder area.  Denies any numbness.  ED evaluation:  Upon arrival patient was noted to be hypotensive with BP of 95/68, s/p 2 L normal saline 111/68, heart rate 58 Blood pressure 111/68, pulse (!) 58, temperature 97.7 F (36.5 C), temperature source Oral, resp. rate 20, height 5' 9 (1.753 m), weight (!) 149.3 kg, SpO2 100%. - CBC CMP within normal limits with exception of BUN of 38, Creatinine of 2.51 - Two-view cervical spine x-ray: IMPRESSION: Decreased cervical lordosis likely due to muscle spasm.  EDP requested patient to be admitted for close observation due to hypotension, and AKI

## 2024-03-13 NOTE — TOC Progression Note (Signed)
 Transition of Care [facility]) - Progression Note    Patient Details  Name: Mary [name] MRN: [id number] Date of Birth: 10/21/1980  Transition of Care [facility]) CM/SW Contact  Sharlyne Stabs, RN Phone Number: 03/13/2024, 3:46 PM  Clinical Narrative:   PT is recommending Outpatient PT.  Orders placed and added to AVS. Patient will follow up for date and time with [facility] outpatient therapy.     Expected Discharge Plan: Home/Self Care Barriers to Discharge: Continued Medical Work up               Expected Discharge Plan and Services          Social Drivers of Health (SDOH) Interventions SDOH Screenings   Food Insecurity: Food Insecurity Present (03/06/2023)  Housing: Medium Risk (03/06/2023)  Transportation Needs: No Transportation Needs (03/06/2023)  Utilities: Not At Risk (03/06/2023)  Alcohol Screen: Low Risk  (03/06/2023)  Depression (PHQ2-9): Low Risk  (02/18/2024)  Recent Concern: Depression (PHQ2-9) - Medium Risk (02/01/2024)  Financial Resource Strain: High Risk (03/06/2023)  Physical Activity: Sufficiently Active (03/06/2023)  Social Connections: Moderately Isolated (03/06/2023)  Stress: No Stress Concern Present (03/06/2023)  Recent Concern: Stress - Stress Concern Present (01/30/2023)  Tobacco Use: Low Risk  (03/13/2024)    Readmission Risk Interventions     No data to display

## 2024-03-13 NOTE — Assessment & Plan Note (Signed)
-   In the setting of hypertension, -We are holding her BP meds (olmesartan -amlodipine -HCTZ) -Avoiding all nephrotoxins -Continue IVF -Monitor BUN/creatinine curves  Lab Results  Component Value Date   CREATININE 2.51 (H) 03/13/2024   CREATININE 1.11 (H) 02/18/2024   CREATININE 1.08 (H) 10/18/2023

## 2024-03-13 NOTE — Progress Notes (Signed)
   03/13/24 1351  TOC Brief Assessment  Insurance and Status Reviewed  Home environment has been reviewed Home  Prior level of function: Independent  Prior/Current Home Services No current home services  Social Drivers of Health Review SDOH reviewed no interventions necessary  Readmission risk has been reviewed Yes  Transition of care needs no transition of care needs at this time   Transition of Care Department [facility]) has reviewed patient and no TOC needs have been identified at this time. We will continue to monitor patient advancement through interdisciplinary progression rounds. If new patient transition needs arise, please place a TOC consult.

## 2024-03-13 NOTE — Assessment & Plan Note (Addendum)
 Body mass index is 48.61 kg/m. - Discussed with patient recommended to follow-up with PCP  -Continue her weight loss program and medications -Continue her GLP-1 inhibitor - Trizepatide -Recommending aggressive weight loss

## 2024-03-13 NOTE — Assessment & Plan Note (Signed)
-   Resuming home medication of oxybutynin

## 2024-03-13 NOTE — Assessment & Plan Note (Signed)
 Cervical os by x-ray 2 view:IMPRESSION: Decreased cervical lordosis likely due to muscle spasm. -Will obtain CT of cervical spine without contrast -Consult PT -As needed muscle relaxants and analgesics

## 2024-03-13 NOTE — Assessment & Plan Note (Signed)
-   With history of depression and anxiety, continue home medication of Celexa , BuSpar , as needed Vistaril

## 2024-03-13 NOTE — Evaluation (Signed)
 Physical Therapy Evaluation Patient Details Name: Mary [name] MRN: [id number] DOB: 20-Oct-1980 Today's Date: 03/13/2024  History of Present Illness  Mary [name] is a 43 year old female with extensive history of morbid obesity, stress incontinence, GERD, DM2, HTN, anxiety, depression presenting to the ED today with chief complaint of left shoulder and neck pain.  Patient was recently seen by PCP who referred patient to pain clinic.  Describes the pain as shooting pain down her arm and shoulder area.  Denies any numbness.   Clinical Impression  Patient functioning at baseline for functional mobility and gait other c/o chronic low back and left sided neck pain, otherwise demonstrates good return for bed mobility, transfers and ambulating in room, hallways without loss of balance or need for an AD. Patient encouraged to ambulate ad lib as tolerated for length of stay. Plan:  Patient discharged from physical therapy to care of nursing for ambulation daily as tolerated for length of stay.          If plan is discharge home, recommend the following: Help with stairs or ramp for entrance   Can travel by private vehicle        Equipment Recommendations None recommended by PT  Recommendations for Other Services       Functional Status Assessment Patient has had a recent decline in their functional status and/or demonstrates limited ability to make significant improvements in function in a reasonable and predictable amount of time     Precautions / Restrictions Precautions Precautions: None Recall of Precautions/Restrictions: Intact Restrictions Weight Bearing Restrictions Per Provider Order: No      Mobility  Bed Mobility Overal bed mobility: Independent                  Transfers Overall transfer level: Independent                      Ambulation/Gait Ambulation/Gait assistance: Modified independent (Device/Increase time) Gait Distance (Feet): 150  Feet Assistive device: None Gait Pattern/deviations: WFL(Within Functional Limits) Gait velocity: slightly decreased     General Gait Details: grossly WFL for ambulation with good return demonstrated  ambulating in room, hallways without loss of balance or need for an AD  Stairs            Wheelchair Mobility     Tilt Bed    Modified Rankin (Stroke Patients Only)       Balance Overall balance assessment: No apparent balance deficits (not formally assessed)                                           Pertinent Vitals/Pain Pain Assessment Pain Assessment: Faces Faces Pain Scale: Hurts little more Pain Location: chronic low back and left sided neck pain Pain Descriptors / Indicators: Discomfort, Sore Pain Intervention(s): Limited activity within patient's tolerance, Monitored during session, Repositioned    Home Living Family/patient expects to be discharged to:: Private residence Living Arrangements: Spouse/significant other;Other relatives Available Help at Discharge: Family;Available PRN/intermittently Type of Home: House Home Access: Stairs to enter Entrance Stairs-Rails: None (has side rails, but unsafe to use) Entrance Stairs-Number of Steps: 2   Home Layout: One level;Laundry or work area in basement;Other (Comment) (Patient states she does not go to basement) Home Equipment: None      Prior Function Prior Level of Function : Independent/Modified Independent;Driving  Mobility Comments: Community ambulation without AD ADLs Comments: Independent for house hold, occasional assistance for community     Extremity/Trunk Assessment   Upper Extremity Assessment Upper Extremity Assessment: Overall WFL for tasks assessed    Lower Extremity Assessment Lower Extremity Assessment: Overall WFL for tasks assessed    Cervical / Trunk Assessment Cervical / Trunk Assessment: Normal  Communication   Communication Communication:  No apparent difficulties    Cognition Arousal: Alert Behavior During Therapy: WFL for tasks assessed/performed   PT - Cognitive impairments: No apparent impairments                         Following commands: Intact       Cueing Cueing Techniques: Verbal cues     General Comments      Exercises     Assessment/Plan    PT Assessment All further PT needs can be met in the next venue of care  PT Problem List Pain;Decreased mobility;Decreased activity tolerance       PT Treatment Interventions      PT Goals (Current goals can be found in the Care Plan section)  Acute Rehab PT Goals Patient Stated Goal: return home PT Goal Formulation: With patient Time For Goal Achievement: 03/13/24 Potential to Achieve Goals: Good    Frequency       Co-evaluation               AM-PAC PT 6 Clicks Mobility  Outcome Measure Help needed turning from your back to your side while in a flat bed without using bedrails?: None Help needed moving from lying on your back to sitting on the side of a flat bed without using bedrails?: None Help needed moving to and from a bed to a chair (including a wheelchair)?: None Help needed standing up from a chair using your arms (e.g., wheelchair or bedside chair)?: None Help needed to walk in hospital room?: None Help needed climbing 3-5 steps with a railing? : A Little 6 Click Score: 23    End of Session   Activity Tolerance: Patient tolerated treatment well Patient left: in bed;with call bell/phone within reach Nurse Communication: Mobility status PT Visit Diagnosis: Unsteadiness on feet (R26.81);Other abnormalities of gait and mobility (R26.89);Muscle weakness (generalized) (M62.81)    Time: 8569-8553 PT Time Calculation (min) (ACUTE ONLY): 16 min   Charges:   PT Evaluation $PT Eval Low Complexity: 1 Low PT Treatments $Therapeutic Activity: 8-22 mins PT General Charges $$ ACUTE PT VISIT: 1 Visit         3:14 PM,  03/13/24 Lynwood Music, MPT Physical Therapist with [facility] [phone] office [phone] mobile phone

## 2024-03-13 NOTE — Assessment & Plan Note (Signed)
-   When holding diuretics -S/p 2 L of normal saline given in ED -Will continue with maintenance IV fluid  - Will holding home BP meds (olmesartan -amlodipine -HCTZ)

## 2024-03-13 NOTE — Assessment & Plan Note (Signed)
 Continue PPI.

## 2024-03-13 NOTE — Assessment & Plan Note (Signed)
-  Continue as needed analgesics. 

## 2024-03-13 NOTE — Assessment & Plan Note (Signed)
 HpEF - Euvolemic, mild dehydrated, with AKI -Monitoring closely Last echo December 2022-reviewed: EJF 60 to 65%. The left ventricle has normal function. The left ventricle has no regional wall motion abnormalities. The left ventricular internal cavity size was severely dilated. Left ventricular diastolic parameters are indeterminate. Elevated left atrial pressure.

## 2024-03-13 NOTE — H&P (Signed)
 History and Physical   Patient: Mary [name]                            PCP: Zarwolo, Gloria, FNP                    DOB: 30-Sep-1980            DOA: 03/13/2024 FMW:[id number]             DOS: 03/13/2024, 1:01 PM  Zarwolo, Gloria, FNP  Patient coming from:   HOME  I have personally reviewed patient's medical records, in electronic medical records, including:  [place] [facility], and care everywhere.    Chief Complaint:   Chief Complaint  Patient presents with   left shoulder and neck pain    History of present illness:    Mary [name] is a 43 year old female with extensive history of morbid obesity, stress incontinence, GERD, DM2, HTN, anxiety, depression presenting to the ED today with chief complaint of left shoulder and neck pain. Patient was recently seen by PCP who referred patient to pain clinic. Describes the pain as shooting pain down her arm and shoulder area.  Denies any numbness.  ED evaluation:  Upon arrival patient was noted to be hypotensive with BP of 95/68, s/p 2 L normal saline 111/68, heart rate 58 Blood pressure 111/68, pulse (!) 58, temperature 97.7 F (36.5 C), temperature source Oral, resp. rate 20, height 5' 9 (1.753 m), weight (!) 149.3 kg, SpO2 100%. - CBC CMP within normal limits with exception of BUN of 38, Creatinine of 2.51 - Two-view cervical spine x-ray: IMPRESSION: Decreased cervical lordosis likely due to muscle spasm.  EDP requested patient to be admitted for close observation due to hypotension, and AKI      Patient Denies having: Fever, Chills, Cough, SOB, Chest Pain, Abd pain, N/V/D, headache, dizziness, lightheadedness,  Dysuria, Joint pain, rash, open wounds     Review of Systems: As per HPI, otherwise 10 point review of systems were negative.   ----------------------------------------------------------------------------------------------------------------------  Allergies  Allergen Reactions   Buprenorphine Hcl  Anaphylaxis    it stops my heart   Morphine  Anaphylaxis    i quit breathing    Morphine  And Codeine  Anaphylaxis    it stops my heart   Penicillins Anaphylaxis and Other (See Comments)    Has patient had a PCN reaction causing immediate rash, facial/tongue/throat swelling, SOB or lightheadedness with hypotension: Yes Has patient had a PCN reaction causing severe rash involving mucus membranes or skin necrosis: Yes Has patient had a PCN reaction that required hospitalization: Yes Has patient had a PCN reaction occurring within the last 10 years: No If all of the above answers are NO, then may proceed with Cephalosporin use.    Gabapentin Hives and Other (See Comments)    Per patient hallucination   Nsaids    Sulfa  Antibiotics     Other reaction(s): Unknown   Ibuprofen -Acetaminophen  Nausea And Vomiting   Latex Rash   Tylenol  [Acetaminophen ] Nausea And Vomiting    Home MEDs:  Prior to Admission medications   Medication Sig Start Date End Date Taking? Authorizing Provider  albuterol  (VENTOLIN  HFA) 108 (90 Base) MCG/ACT inhaler Inhale 2 puffs into the lungs every 6 (six) hours as needed for wheezing or shortness of breath. 07/18/22   Cook, Jayce G, DO  busPIRone  (BUSPAR ) 7.5 MG tablet Take 1 tablet (7.5 mg total) by mouth 2 (two)  times daily. 01/17/24   Tobie Suzzane POUR, MD  citalopram  (CELEXA ) 20 MG tablet Take 1 tablet (20 mg total) by mouth daily. For depression 01/17/24   Tobie Suzzane POUR, MD  hydrOXYzine  (VISTARIL ) 25 MG capsule Take 1 capsule (25 mg total) by mouth every 8 (eight) hours as needed. For anxiety 01/17/24   Tobie Suzzane POUR, MD  omeprazole  (PRILOSEC) 40 MG capsule Take 1 capsule (40 mg total) by mouth daily. 10/18/23   Zarwolo, Gloria, FNP  oxybutynin  (DITROPAN -XL) 10 MG 24 hr tablet Take 1 tablet (10 mg total) by mouth at bedtime. 07/09/23   Gaston Hamilton, MD  tirzepatide  (ZEPBOUND ) 2.5 MG/0.5ML injection vial Inject 2.5 mg into the skin once a week. 02/18/24    Zarwolo, Gloria, FNP  Vitamin D , Ergocalciferol , (DRISDOL ) 1.25 MG (50000 UNIT) CAPS capsule Take 1 capsule (50,000 Units total) by mouth every 7 (seven) days. 06/25/23   Zarwolo, Gloria, FNP    PRN MEDs: bisacodyl , hydrALAZINE , HYDROmorphone  (DILAUDID ) injection, hydrOXYzine , ipratropium, levalbuterol , ondansetron  **OR** ondansetron  (ZOFRAN ) IV, oxyCODONE , senna-docusate, sodium phosphate , traZODone   Past Medical History:  Diagnosis Date   Allergy    Anxiety    Asthma    Bipolar 1 disorder (HCC)    Bulging lumbar disc    BV (bacterial vaginosis) 02/18/2018   Cancer of cervix (HCC)    Carpal tunnel syndrome on right    Cervical cancer (HCC)    Chronic back pain    COPD (chronic obstructive pulmonary disease) (HCC)    Depression    Hypertension    Migraines    Physiological ovarian cysts    Plantar fasciitis    PTSD (post-traumatic stress disorder)    Rheumatoid arthritis (HCC)    S/P partial hysterectomy    Vaginal Pap smear, abnormal     Past Surgical History:  Procedure Laterality Date   ABDOMINAL HYSTERECTOMY     ABDOMINAL HYSTERECTOMY     COLONOSCOPY WITH PROPOFOL  N/A 04/10/2023   Procedure: COLONOSCOPY WITH PROPOFOL ;  Surgeon: Eartha Angelia Sieving, MD;  Location: [facility];  Service: Gastroenterology;  Laterality: N/A;  9:00 am, asa 3, pt knows to arrive at 8:30   EYE SURGERY  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 She has never been exposed to tobacco smoke. She has never used smokeless tobacco. She reports that she  does not currently use drugs after having used the following drugs: Marijuana. She reports that she does not drink alcohol.   Family History  Problem Relation Age of Onset   Stroke Mother        TIA   Asthma Mother    Cancer Mother        leukemia   Other Mother        blood platelets are dropping   Heart attack Father    Hypertension Father    Cancer Father        skin cancer   Stroke Father    Colon polyps Father    Fibromyalgia Sister    Cancer Sister  cervical   Cancer Sister        cervical   Cancer Brother        thyroid  cancer   Bipolar disorder Brother    Anxiety disorder Brother    Post-traumatic stress disorder Brother    Diabetes Maternal Grandmother    Heart Problems Maternal Grandmother    Anuerysm Maternal Grandfather    Seizures Maternal Grandfather    Cancer Paternal Grandmother        breast   Asthma Daughter    Sleep apnea Son    Cancer Maternal Aunt        breast cancer   Cancer Paternal Aunt    Colon cancer Maternal Uncle 76 - 79   Lung cancer Maternal Uncle     Physical Exam:   Vitals:   03/13/24 0839 03/13/24 1139 03/13/24 1145 03/13/24 1230  BP: 95/68 111/68 103/72 101/70  Pulse: 60 (!) 58 72 (!) 52  Resp: 20 20 14 18   Temp: 97.6 F (36.4 C) 97.7 F (36.5 C)    TempSrc: Oral Oral    SpO2: 96% 100% 93% 96%  Weight:      Height:       Constitutional: NAD, calm, comfortable Eyes: PERRL, lids and conjunctivae normal ENMT: Mucous membranes are moist. Posterior pharynx clear of any exudate or lesions.Normal dentition.  Neck: normal, supple, no masses, no thyromegaly Respiratory: clear to auscultation bilaterally, no wheezing, no crackles. Normal respiratory effort. No accessory muscle use.  Cardiovascular: Regular rate and rhythm, no murmurs / rubs / gallops. No extremity edema. 2+ pedal pulses. No carotid bruits.  Abdomen: no tenderness, no masses palpated. No hepatosplenomegaly. Bowel sounds positive.  Musculoskeletal: no  clubbing / cyanosis. No joint deformity upper and lower extremities. Good ROM, no contractures. Normal muscle tone.  Neurologic: CN II-XII grossly intact. Sensation intact, DTR normal. Strength 5/5 in all 4.  Psychiatric: Normal judgment and insight. Alert and oriented x 3. Normal mood.  Skin: no rashes, lesions, ulcers. No induration    Labs on admission:    I have personally reviewed following labs and imaging studies  CBC: Recent Labs  Lab 03/13/24 0929  WBC 7.0  NEUTROABS 4.8  HGB 13.0  HCT 39.9  MCV 89.9  PLT 184   Basic Metabolic Panel: Recent Labs  Lab 03/13/24 0929  NA 138  K 4.7  CL 102  CO2 27  GLUCOSE 97  BUN 38*  CREATININE 2.51*  CALCIUM 9.6  MG 2.6*  PHOS 4.0   GFR: Estimated Creatinine Clearance: 44.9 mL/min (A) (by C-G formula based on SCr of 2.51 mg/dL (H)). Liver Function Tests: Recent Labs  Lab 03/13/24 0929  AST 21  ALT 26  ALKPHOS 104  BILITOT 0.8  PROT 7.8  ALBUMIN 4.3    Urine analysis:    Component Value Date/Time   COLORURINE YELLOW (A) 01/05/2020 1634   APPEARANCEUR Clear 07/09/2023 0930   LABSPEC 1.020 01/05/2020 1634   LABSPEC 1.005 09/15/2013 1913   PHURINE 7.0 01/05/2020 1634   GLUCOSEU Negative 07/09/2023 0930   GLUCOSEU Negative 09/15/2013 1913   HGBUR NEGATIVE 01/05/2020 1634   BILIRUBINUR Negative 07/09/2023 0930   BILIRUBINUR Negative 09/15/2013 1913   KETONESUR NEGATIVE 01/05/2020 1634   PROTEINUR Negative 07/09/2023 0930   PROTEINUR NEGATIVE 01/05/2020 1634   UROBILINOGEN 0.2 09/03/2014 1935   NITRITE Negative 07/09/2023 0930   NITRITE NEGATIVE 01/05/2020 1634   LEUKOCYTESUR Negative 07/09/2023 0930   LEUKOCYTESUR LARGE (A) 01/05/2020 1634   LEUKOCYTESUR Negative  09/15/2013 1913    Last A1C:  Lab Results  Component Value Date   HGBA1C 5.0 02/18/2024     Radiologic Exams on Admission:   DG Cervical Spine 2 or 3 views Result Date: 03/13/2024 CLINICAL DATA:  Pain EXAM: CERVICAL SPINE - 2-3 VIEW  COMPARISON:  None Available. FINDINGS: There is no evidence of cervical spine fracture or prevertebral soft tissue swelling. Mild straightening of the cervical spine. Alignment is otherwise normal. No other significant bone abnormalities are identified. IMPRESSION: Decreased cervical lordosis likely due to muscle spasm. Electronically Signed   By: Megan  Zare M.D.   On: 03/13/2024 10:54   DG Chest 2 View Result Date: 03/13/2024 EXAM: 2 VIEW(S) XRAY OF THE CHEST 03/13/2024 10:21:00 AM COMPARISON: 07/10/2023 CLINICAL HISTORY: Pain. Per triage; Pt c/o left shoulder and neck pain. States she has seen her PCP and was referred to pain clinic and she takes pain medication that helps, however this am and recently she has felt more of a nerve pain shooting up through her neck from shoulder area. Hx of HTN, no diabetes, no blood thinners. FINDINGS: LUNGS AND PLEURA: No focal pulmonary opacity. No pulmonary edema. No pleural effusion. No pneumothorax. HEART AND MEDIASTINUM: Mild cardiomegaly. No congestive heart failure. BONES AND SOFT TISSUES: No acute osseous abnormality. Wires and leads project over the chest on the frontal view. IMPRESSION: 1. No acute findings. 2. Mild cardiomegaly. No congestive heart failure. Electronically signed by: Rockey Kilts MD 03/13/2024 10:53 AM EDT RP Workstation: [id number]    EKG:   Independently reviewed.  Orders placed or performed during the hospital encounter of 03/13/24   EKG 12-Lead   EKG 12-Lead   EKG 12-Lead   EKG 12-Lead   EKG 12-Lead   ---------------------------------------------------------------------------------------------------------------------------------------    Assessment / Plan:   Principal Problem:   AKI (acute kidney injury) (HCC) Active Problems:   Hypotensive episode   Neck pain   Mixed stress and urge urinary incontinence   Blind right eye   Chronic pain syndrome   Mood disorder (HCC)   Chronic diastolic CHF (congestive heart  failure) (HCC)   Body mass index (BMI) 50.0-59.9, adult (HCC)   GERD without esophagitis   Type 2 diabetes mellitus without complication, without long-term current use of insulin  (HCC)   Morbid obesity (HCC)   GAD (generalized anxiety disorder)   Assessment and Plan: * AKI (acute kidney injury) (HCC) - In the setting of hypertension, -We are holding her BP meds (olmesartan -amlodipine -HCTZ) -Avoiding all nephrotoxins -Continue IVF -Monitor BUN/creatinine curves  Lab Results  Component Value Date   CREATININE 2.51 (H) 03/13/2024   CREATININE 1.11 (H) 02/18/2024   CREATININE 1.08 (H) 10/18/2023     Neck pain Cervical os by x-ray 2 view:IMPRESSION: Decreased cervical lordosis likely due to muscle spasm. -Will obtain CT of cervical spine without contrast -Consult PT -As needed muscle relaxants and analgesics  Hypotensive episode - When holding diuretics -S/p 2 L of normal saline given in ED -Will continue with maintenance IV fluid  - Will holding home BP meds (olmesartan -amlodipine -HCTZ)  Mixed stress and urge urinary incontinence - Resuming home medication of oxybutynin   GAD (generalized anxiety disorder) - Continue as needed Atarax   Morbid obesity (HCC) Body mass index is 48.61 kg/m. - Discussed with patient recommended to follow-up with PCP  -Continue her weight loss program and medications -Continue her GLP-1 inhibitor - Trizepatide -Recommending aggressive weight loss  Type 2 diabetes mellitus without complication, without long-term current use of insulin  (HCC) - Patient states  that she is not diabetic - Checking CBG q. ACHS, SSI coverage - Last A1c 02/18/2024 was 5.0  - She is on GLP-1 inhibitor or a setting   GERD without esophagitis - Continue PPI  Chronic diastolic CHF (congestive heart failure) (HCC) HpEF - Euvolemic, mild dehydrated, with AKI -Monitoring closely Last echo December 2022-reviewed: EJF 60 to 65%. The left ventricle has normal  function. The left ventricle has no regional wall motion abnormalities. The left ventricular internal cavity size was severely dilated. Left ventricular diastolic parameters are indeterminate. Elevated left atrial pressure.   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 Level of care: Med-Surg   Family Communication:  none at bedside  (The above findings and plan of care has been discussed with patient in detail, the patient expressed understanding and agreement of above plan)  --------------------------------------------------------------------------------------------------------------------------------------------------  Disposition Plan:  Anticipated 1-2 days Status is: Observation The patient remains OBS appropriate and will d/c before 2 midnights.     ----------------------------------------------------------------------------------------------------------------------------------------------------  Time spent:  54  Min.  Was spent seeing and evaluating the patient, reviewing all medical records, drawn plan of care.  SIGNED: Adriana DELENA Grams, MD, FHM. FAAFP. [place] [facility], Pager  (Please use [email or web address] to page/ or secure chat through epic) If 7PM-7AM, please contact night-coverage [email or web address],  03/13/2024, 1:01 PM

## 2024-03-14 ENCOUNTER — Institutional Professional Consult (permissible substitution): Admitting: Professional Counselor

## 2024-03-14 DIAGNOSIS — N179 Acute kidney failure, unspecified: Secondary | ICD-10-CM | POA: Diagnosis not present

## 2024-03-14 LAB — GLUCOSE, CAPILLARY
Glucose-Capillary: 76 mg/dL (ref 70–99)
Glucose-Capillary: 85 mg/dL (ref 70–99)
Glucose-Capillary: 102 mg/dL — ABNORMAL HIGH (ref 70–99)

## 2024-03-14 LAB — BASIC METABOLIC PANEL WITH GFR
Anion gap: 6 (ref 5–15)
Anion gap: 10 (ref 5–15)
BUN: 29 mg/dL — ABNORMAL HIGH (ref 6–20)
BUN: 26 mg/dL — ABNORMAL HIGH (ref 6–20)
CO2: 25 mmol/L (ref 22–32)
CO2: 24 mmol/L (ref 22–32)
Calcium: 8.6 mg/dL — ABNORMAL LOW (ref 8.9–10.3)
Calcium: 8.9 mg/dL (ref 8.9–10.3)
Chloride: 108 mmol/L (ref 98–111)
Chloride: 105 mmol/L (ref 98–111)
Creatinine, Ser: 1.93 mg/dL — ABNORMAL HIGH (ref 0.44–1.00)
Creatinine, Ser: 1.9 mg/dL — ABNORMAL HIGH (ref 0.44–1.00)
GFR, Estimated: 32 mL/min — ABNORMAL LOW (ref >60)
GFR, Estimated: 33 mL/min — ABNORMAL LOW
Glucose, Bld: 85 mg/dL (ref 70–99)
Glucose, Bld: 88 mg/dL (ref 70–99)
Potassium: 4.3 mmol/L (ref 3.5–5.1)
Potassium: 4.2 mmol/L (ref 3.5–5.1)
Sodium: 139 mmol/L (ref 135–145)
Sodium: 139 mmol/L (ref 135–145)

## 2024-03-14 MED ORDER — SODIUM CHLORIDE 0.9 % IV BOLUS
1000.0000 mL | Freq: Once | INTRAVENOUS | Status: AC
  Administered 2024-03-14: 1000 mL via INTRAVENOUS

## 2024-03-14 MED ORDER — SODIUM CHLORIDE 0.9 % IV SOLN: INTRAVENOUS | Status: DC

## 2024-03-14 NOTE — TOC Transition Note (Signed)
 Transition of Care [facility]) - Discharge Note  Patient Details  Name: Mary [name] MRN: [id number] Date of Birth: 1980-12-30  [facility] CM/SW Contact:  Nena LITTIE Coffee, RN Phone Number: 03/14/2024, 2:46 PM  Clinical Narrative:    Pt to dc home. Referral made to [facility] outpatient PT and info is on pt's AVS.   Final next level of care: Home/Self Care Barriers to Discharge: Barriers Resolved  Patient Goals and CMS Choice Patient states their goals for this hospitalization and ongoing recovery are:: return home CMS Medicare.gov Compare Post Acute Care list provided to:: Patient Choice offered to / list presented to : Patient  ownership interest in [facility].provided to:: Patient   Discharge Placement Discharge Plan and Services Additional resources added to the After Visit Summary for    Social Drivers of Health (SDOH) Interventions SDOH Screenings   Food Insecurity: No Food Insecurity (03/13/2024)  Housing: Low Risk  (03/13/2024)  Transportation Needs: No Transportation Needs (03/13/2024)  Utilities: Not At Risk (03/13/2024)  Alcohol Screen: Low Risk  (03/06/2023)  Depression (PHQ2-9): Low Risk  (02/18/2024)  Recent Concern: Depression (PHQ2-9) - Medium Risk (02/01/2024)  Financial Resource Strain: High Risk (03/06/2023)  Physical Activity: Sufficiently Active (03/06/2023)  Social Connections: Moderately Isolated (03/06/2023)  Stress: No Stress Concern Present (03/06/2023)  Recent Concern: Stress - Stress Concern Present (01/30/2023)  Tobacco Use: Low Risk  (03/13/2024)  Readmission Risk Interventions     No data to display

## 2024-03-14 NOTE — Plan of Care (Signed)

## 2024-03-14 NOTE — Discharge Summary (Signed)
 Physician Discharge Summary   Patient: Mary [name] MRN: [id number] DOB: 1981-07-26  Admit date:     03/13/2024  Discharge date: 03/14/24  Discharge Physician: Adriana DELENA Grams   PCP: Zarwolo, Gloria, FNP   Recommendations at discharge:   Follow-up with the PCP in 1 week BMP in 1 week results to PCP to monitor kidney function Discontinued all blood pressure medication and diuretics due to hypotension, elevated creatinine Avoid all nephrotoxins (including NSAIDs)  Discharge Diagnoses: Principal Problem:   AKI (acute kidney injury) (HCC) Active Problems:   Hypotensive episode   Neck pain   Mixed stress and urge urinary incontinence   Blind right eye   Chronic pain syndrome   Mood disorder (HCC)   Chronic diastolic CHF (congestive heart failure) (HCC)   Body mass index (BMI) 50.0-59.9, adult (HCC)   GERD without esophagitis   Type 2 diabetes mellitus without complication, without long-term current use of insulin  (HCC)   Morbid obesity (HCC)   GAD (generalized anxiety disorder)  Resolved Problems:   * No resolved hospital problems. *  Hospital Course: Mary [name] is a 43 year old female with extensive history of morbid obesity, stress incontinence, GERD, DM2, HTN, anxiety, depression presenting to the ED today with chief complaint of left shoulder and neck pain. Patient was recently seen by PCP who referred patient to pain clinic. Describes the pain as shooting pain down her arm and shoulder area.  Denies any numbness.  ED evaluation:  Upon arrival patient was noted to be hypotensive with BP of 95/68, s/p 2 L normal saline 111/68, heart rate 58 Blood pressure 111/68, pulse (!) 58, temperature 97.7 F (36.5 C), temperature source Oral, resp. rate 20, height 5' 9 (1.753 m), weight (!) 149.3 kg, SpO2 100%. - CBC CMP within normal limits with exception of BUN of 38, Creatinine of 2.51 - Two-view cervical spine x-ray: IMPRESSION: Decreased cervical lordosis likely  due to muscle spasm.  EDP requested patient to be admitted for close observation due to hypotension, and AKI     * AKI (acute kidney injury) (HCC) - In the setting of hypertension, -We are holding her BP meds (olmesartan -amlodipine -HCTZ) -Avoiding all nephrotoxins -Continue IVF -Monitor BUN 38, 29, 26/creatinine curves GFR Lab Results  Component Value Date   CREATININE 1.90 (H) 03/14/2024   CREATININE 1.93 (H) 03/14/2024   CREATININE 2.51 (H) 03/13/2024   Neck pain Cervical os by x-ray 2 view:IMPRESSION: Decreased cervical lordosis likely due to muscle spasm. --CT cervical spine reviewed, negative for any acute abnormalities -Consult PT -As needed muscle relaxants and analgesics  Hypotensive episode - When holding diuretics -S/p 2 L of normal saline given in ED-maintenance IV fluid x 24 hours - Discontinued home BP meds (olmesartan -amlodipine -HCTZ)  Mixed stress and urge urinary incontinence - Resuming home medication of oxybutynin   GAD (generalized anxiety disorder) - Continue as needed Atarax   Morbid obesity (HCC) Body mass index is 48.61 kg/m. - Discussed with patient recommended to follow-up with PCP  -Continue her weight loss program and medications -Continue her GLP-1 inhibitor - Trizepatide -Recommending aggressive weight loss  Type 2 diabetes mellitus without complication, without long-term current use of insulin  (HCC) - Patient states that she is not diabetic - Checking CBG q. ACHS, SSI coverage - Last A1c 02/18/2024 was 5.0  - She is on GLP-1 inhibitor or a setting   GERD without esophagitis - Continue PPI  Chronic diastolic CHF (congestive heart failure) (HCC) HpEF - Euvolemic, mild dehydrated, with AKI -Monitoring closely Last echo  December 2022-reviewed: EJF 60 to 65%. The left ventricle has normal function. The left ventricle has no regional wall motion abnormalities. The left ventricular internal cavity size was severely dilated. Left  ventricular diastolic parameters are indeterminate. Elevated left atrial pressure.   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      Procedures performed: CT cervical spine Disposition: Home Diet recommendation:  Discharge Diet Orders (From admission, onward)     Start     Ordered   03/14/24 0000  Diet - low sodium heart healthy        03/14/24 1156           Regular diet DISCHARGE MEDICATION: Allergies as of 03/14/2024       Reactions   Buprenorphine Hcl Anaphylaxis   It stops my heart   Morphine  Anaphylaxis   I quit breathing    Morphine  And Codeine  Anaphylaxis   It stops my heart   Penicillins Anaphylaxis, Other (See Comments)   Immediate rash, facial/tongue/throat swelling, SOB or lightheadedness with hypotension Severe rash involving mucus membranes or skin necrosis   Gabapentin Hives, Other (See Comments)   Per patient hallucination   Nsaids Other (See Comments)   Unknown    Sulfa  Antibiotics Other (See Comments)   Unknown   Ibuprofen -acetaminophen  Nausea And Vomiting   Latex Rash   Tylenol  [acetaminophen ] Nausea And Vomiting        Medication List     STOP taking these medications    hydrOXYzine  25 MG capsule Commonly known as: VISTARIL    losartan  100 MG tablet Commonly known as: COZAAR    potassium chloride  10 MEQ tablet Commonly known as: KLOR-CON        TAKE these medications    albuterol  108 (90 Base) MCG/ACT inhaler Commonly known as: VENTOLIN  HFA Inhale 2 puffs into the lungs every 6 (six) hours as needed for wheezing or shortness of breath.   busPIRone  7.5 MG tablet Commonly known as: BUSPAR  Take 1 tablet (7.5 mg total) by mouth 2 (two) times daily.   citalopram  20 MG tablet Commonly known as: CELEXA  Take 1 tablet (20 mg total) by  mouth daily. For depression   naloxone 4 MG/0.1ML Liqd nasal spray kit Commonly known as: NARCAN Place 1 spray into the nose once.   omeprazole  40 MG capsule Commonly known as: PRILOSEC Take 1 capsule (40 mg total) by mouth daily. What changed:  when to take this reasons to take this   oxybutynin  10 MG 24 hr tablet Commonly known as: DITROPAN -XL Take 1 tablet (10 mg total) by mouth at bedtime.   oxyCODONE  5 MG immediate release tablet Commonly known as: Oxy IR/ROXICODONE  Take 5 mg by mouth every 4 (four) hours as needed for severe pain (pain score 7-10).   Vitamin D  (Ergocalciferol ) 1.25 MG (50000 UNIT) Caps capsule Commonly known as: DRISDOL  Take 1 capsule (50,000 Units total) by mouth every 7 (seven) days. What changed: when to take this   Zepbound  2.5 MG/0.5ML Pen Generic drug: tirzepatide  Inject 2.5 mg into the skin every Friday.        Discharge Exam: Filed Weights   03/13/24 0830 03/13/24 1336 03/14/24 0500  Weight: (!) 149.3 kg (!) 149.3 kg (!) 148.7 kg        General:  AAO x 3,  cooperative, no distress;   HEENT:  Normocephalic, PERRL, otherwise with in Normal limits   Neuro:  CNII-XII intact. , normal motor and sensation, reflexes intact   Lungs:   Clear to auscultation BL, Respirations unlabored,  No wheezes / crackles  Cardio:    S1/S2, RRR, No murmure, No Rubs or Gallops   Abdomen:  Soft, non-tender, bowel sounds active all four quadrants, no guarding or peritoneal signs.  Muscular  skeletal:  Limited exam -global generalized weaknesses - in bed, able to move all 4 extremities,   2+ pulses,  symmetric, No pitting edema  Skin:  Dry, warm to touch, negative for any Rashes,  Wounds: Please see nursing documentation          Condition at discharge: good  The results of significant diagnostics from this hospitalization (including imaging, microbiology, ancillary and laboratory) are listed below for reference.   Imaging Studies: CT CERVICAL  SPINE WO CONTRAST Result Date: 03/13/2024 CLINICAL DATA:  Cervical radiculopathy, pain shooting down arms and pain in shoulders. EXAM: CT CERVICAL SPINE WITHOUT CONTRAST TECHNIQUE: Multidetector CT imaging of the cervical spine was performed without intravenous contrast. Multiplanar CT image reconstructions were also generated. RADIATION DOSE REDUCTION: This exam was performed according to the departmental dose-optimization program which includes automated exposure control, adjustment of the mA and/or kV according to patient size and/or use of iterative reconstruction technique. COMPARISON:  None Available. FINDINGS: Alignment: Straightening of the normal cervical lordosis. No significant listhesis. No facet subluxation or dislocation. Skull base and vertebrae: No acute fracture. No primary bone lesion or focal pathologic process. Soft tissues and spinal canal: No prevertebral fluid or swelling. No visible canal hematoma. Disc levels: Intervertebral disc spaces are relatively maintained. Mild degenerative endplate osteophytes. There is no high-grade osseous spinal canal stenosis. No high-grade osseous foraminal stenosis. Upper chest: No acute findings. Other: None. IMPRESSION: No acute fracture or malalignment of the cervical spine. No high-grade osseous spinal canal or foraminal stenosis. Electronically Signed   By: Donnice Mania M.D.   On: 03/13/2024 15:58   US  RENAL Result Date: 03/13/2024 CLINICAL DATA:  Acute kidney injury. EXAM: RENAL / URINARY TRACT ULTRASOUND COMPLETE COMPARISON:  None Available. FINDINGS: Right Kidney: Renal measurements: 11.3 x 5.2 x 6.2 cm = volume: 188 mL. Normal parenchymal echogenicity. No hydronephrosis. No visualized stone or focal lesion. Left Kidney: Renal measurements: 10.3 x 5 x 5.4 cm = volume: 145 mL. Normal parenchymal echogenicity. No hydronephrosis. No visualized stone or focal lesion. Bladder: Only minimally distended, normal for degree of distension. Ureteral jets are  not visualized. Other: Incidental note of hepatic steatosis. IMPRESSION: Normal sonographic appearance of the kidneys and bladder. Electronically Signed   By: Andrea Gasman M.D.   On: 03/13/2024 13:39   DG Cervical Spine 2 or 3 views Result Date: 03/13/2024 CLINICAL DATA:  Pain EXAM: CERVICAL SPINE - 2-3 VIEW COMPARISON:  None Available. FINDINGS: There is no evidence of cervical spine fracture or prevertebral soft tissue swelling. Mild straightening of the cervical spine. Alignment is otherwise normal. No other significant bone abnormalities are identified. IMPRESSION: Decreased cervical lordosis likely due to muscle spasm. Electronically Signed   By: Megan  Zare M.D.   On: 03/13/2024 10:54   DG Chest 2 View Result Date: 03/13/2024 EXAM: 2 VIEW(S) XRAY OF THE CHEST 03/13/2024 10:21:00 AM COMPARISON: 07/10/2023 CLINICAL HISTORY: Pain. Per triage; Pt c/o left shoulder and neck pain. States she has seen her PCP and was referred to pain clinic and she takes pain medication that helps, however this am and recently she  has felt more of a nerve pain shooting up through her neck from shoulder area. Hx of HTN, no diabetes, no blood thinners. FINDINGS: LUNGS AND PLEURA: No focal pulmonary opacity. No pulmonary edema. No pleural effusion. No pneumothorax. HEART AND MEDIASTINUM: Mild cardiomegaly. No congestive heart failure. BONES AND SOFT TISSUES: No acute osseous abnormality. Wires and leads project over the chest on the frontal view. IMPRESSION: 1. No acute findings. 2. Mild cardiomegaly. No congestive heart failure. Electronically signed by: Rockey Kilts MD 03/13/2024 10:53 AM EDT RP Workstation: [id number]    Microbiology: Results for orders placed or performed during the hospital encounter of 07/10/23  SARS Coronavirus 2 by RT PCR (hospital order, performed in [facility] hospital lab) *cepheid single result test* Anterior Nasal Swab     Status: None   Collection Time: 07/10/23 12:33 PM   Specimen:  Anterior Nasal Swab  Result Value Ref Range Status   SARS Coronavirus 2 by RT PCR NEGATIVE NEGATIVE Final    Comment: (NOTE) SARS-CoV-2 target nucleic acids are NOT DETECTED.  The SARS-CoV-2 RNA is generally detectable in upper and lower respiratory specimens during the acute phase of infection. The lowest concentration of SARS-CoV-2 viral copies this assay can detect is 250 copies / mL. A negative result does not preclude SARS-CoV-2 infection and should not be used as the sole basis for treatment or other patient management decisions.  A negative result may occur with improper specimen collection / handling, submission of specimen other than nasopharyngeal swab, presence of viral mutation(s) within the areas targeted by this assay, and inadequate number of viral copies (<250 copies / mL). A negative result must be combined with clinical observations, patient history, and epidemiological information.  Fact Sheet for Patients:   RoadLapTop.co.za  Fact Sheet for Healthcare Providers: http://kim-miller.com/  This test is not yet approved or  cleared by the [place]  FDA and has been authorized for detection and/or diagnosis of SARS-CoV-2 by FDA under an Emergency Use Authorization (EUA).  This EUA will remain in effect (meaning this test can be used) for the duration of the COVID-19 declaration under Section 564(b)(1) of the Act, 21 U.S.C. section 360bbb-3(b)(1), unless the authorization is terminated or revoked sooner.  Performed at [facility], [address] [place], [place] [address]     Labs: CBC: Recent Labs  Lab 03/13/24 0929  WBC 7.0  NEUTROABS 4.8  HGB 13.0  HCT 39.9  MCV 89.9  PLT 184   Basic Metabolic Panel: Recent Labs  Lab 03/13/24 0929 03/14/24 0439 03/14/24 1045  NA 138 139 139  K 4.7 4.3 4.2  CL 102 108 105  CO2 27 25 24   GLUCOSE 97 85 88  BUN 38* 29* 26*  CREATININE 2.51* 1.93* 1.90*  CALCIUM  9.6 8.6* 8.9  MG 2.6*  --   --   PHOS 4.0  --   --    Liver Function Tests: Recent Labs  Lab 03/13/24 0929  AST 21  ALT 26  ALKPHOS 104  BILITOT 0.8  PROT 7.8  ALBUMIN 4.3   CBG: Recent Labs  Lab 03/13/24 1627 03/13/24 2153 03/14/24 0716 03/14/24 1113  GLUCAP 106* 94 76 85    Discharge time spent: greater than 30 minutes.  Signed: Adriana DELENA Grams, MD [facility] 03/14/2024

## 2024-03-14 NOTE — Progress Notes (Signed)
 OT Cancellation Note  Patient Details Name: Mary [name] MRN: [id number] DOB: [birth year]   Cancelled Treatment:    Reason Eval/Treat Not Completed: OT screened, no needs identified, will sign off. Pt evaluated by physical therapy and appears to be independent and not in need of OT services. Pt will be removed from the OT list. Thank you.   Braelon Sprung OT, MOT   Jayson Person 03/14/2024, 8:19 AM

## 2024-03-16 NOTE — ED Provider Notes (Signed)
 [facility] MEDICAL SURGICAL UNIT Provider Note   CSN: [id number] Arrival date & time: 03/13/24  9188     Patient presents with: left shoulder and neck pain   Mary [name] is a 43 y.o. female.   Patient complains of pain radiating down her left arm and mild weakness.  Patient has a history of chronic pain  The history is provided by the patient and medical records.  Weakness Severity:  Moderate Onset quality:  Sudden Progression:  Worsening Chronicity:  New Context: not alcohol use   Relieved by:  Nothing Worsened by:  Nothing Ineffective treatments:  None tried Associated symptoms: no abdominal pain, no chest pain, no cough, no diarrhea, no frequency, no headaches and no seizures        Prior to Admission medications   Medication Sig Start Date End Date Taking? Authorizing Provider  albuterol  (VENTOLIN  HFA) 108 (90 Base) MCG/ACT inhaler Inhale 2 puffs into the lungs every 6 (six) hours as needed for wheezing or shortness of breath. 07/18/22  Yes Cook, Jayce G, DO  busPIRone  (BUSPAR ) 7.5 MG tablet Take 1 tablet (7.5 mg total) by mouth 2 (two) times daily. 01/17/24  Yes Tobie Suzzane POUR, MD  citalopram  (CELEXA ) 20 MG tablet Take 1 tablet (20 mg total) by mouth daily. For depression 01/17/24  Yes Tobie Suzzane POUR, MD  naloxone [facility]) nasal spray 4 mg/0.1 mL Place 1 spray into the nose once. 02/11/24  Yes [provider]  omeprazole  (PRILOSEC) 40 MG capsule Take 1 capsule (40 mg total) by mouth daily. Patient taking differently: Take 40 mg by mouth daily as needed (heartburn, acid reflux). 10/18/23  Yes Zarwolo, Gloria, FNP  oxybutynin  (DITROPAN -XL) 10 MG 24 hr tablet Take 1 tablet (10 mg total) by mouth at bedtime. 07/09/23  Yes MacDiarmid, Glendia, MD  oxyCODONE  (OXY IR/ROXICODONE ) 5 MG immediate release tablet Take 5 mg by mouth every 4 (four) hours as needed for severe pain (pain score 7-10).   Yes [provider]  Vitamin D , Ergocalciferol , (DRISDOL ) 1.25 MG  (50000 UNIT) CAPS capsule Take 1 capsule (50,000 Units total) by mouth every 7 (seven) days. Patient taking differently: Take 50,000 Units by mouth every Sunday. 06/25/23  Yes Zarwolo, Gloria, FNP  ZEPBOUND  2.5 MG/0.5ML Pen Inject 2.5 mg into the skin every Friday. 02/19/24  Yes [provider]    Allergies: Buprenorphine hcl, Morphine , Morphine  and codeine , Penicillins, Gabapentin, Nsaids, Sulfa  antibiotics, Ibuprofen -acetaminophen , Latex, and Tylenol  [acetaminophen ]    Review of Systems  Constitutional:  Negative for appetite change and fatigue.  HENT:  Negative for congestion, ear discharge and sinus pressure.   Eyes:  Negative for discharge.  Respiratory:  Negative for cough.   Cardiovascular:  Negative for chest pain.  Gastrointestinal:  Negative for abdominal pain and diarrhea.  Genitourinary:  Negative for frequency and hematuria.  Musculoskeletal:  Negative for back pain.       Left extremity pain  Skin:  Negative for rash.  Neurological:  Positive for weakness. Negative for seizures and headaches.  Psychiatric/Behavioral:  Negative for hallucinations.     Updated Vital Signs BP 106/63 (BP Location: Left Arm)   Pulse (!) 57   Temp 97.7 F (36.5 C) (Oral)   Resp 18   Ht 5' 9 (1.753 m)   Wt (!) 148.7 kg   SpO2 100%   BMI 48.41 kg/m   Physical Exam Vitals and nursing note reviewed.  Constitutional:      Appearance: She is well-developed.  HENT:  Head: Normocephalic.     Nose: Nose normal.  Eyes:     General: No scleral icterus.    Conjunctiva/sclera: Conjunctivae normal.  Neck:     Thyroid : No thyromegaly.  Cardiovascular:     Rate and Rhythm: Normal rate and regular rhythm.     Heart sounds: No murmur heard.    No friction rub. No gallop.  Pulmonary:     Breath sounds: No stridor. No wheezing or rales.  Chest:     Chest wall: No tenderness.  Abdominal:     General: There is no distension.     Tenderness: There is no abdominal tenderness. There  is no rebound.  Musculoskeletal:        General: Normal range of motion.     Cervical back: Neck supple.  Lymphadenopathy:     Cervical: No cervical adenopathy.  Skin:    Findings: No erythema or rash.  Neurological:     Mental Status: She is alert and oriented to person, place, and time.     Motor: No abnormal muscle tone.     Coordination: Coordination normal.  Psychiatric:        Behavior: Behavior normal.     (all labs ordered are listed, but only abnormal results are displayed) Labs Reviewed  COMPREHENSIVE METABOLIC PANEL WITH GFR - Abnormal; Notable for the following components:      Result Value   BUN 38 (*)    Creatinine, Ser 2.51 (*)    GFR, Estimated 24 (*)    All other components within normal limits  MAGNESIUM - Abnormal; Notable for the following components:   Magnesium 2.6 (*)    All other components within normal limits  HEMOGLOBIN A1C - Abnormal; Notable for the following components:   Hgb A1c MFr Bld 4.5 (*)    All other components within normal limits  GLUCOSE, CAPILLARY - Abnormal; Notable for the following components:   Glucose-Capillary 106 (*)    All other components within normal limits  BASIC METABOLIC PANEL WITH GFR - Abnormal; Notable for the following components:   BUN 29 (*)    Creatinine, Ser 1.93 (*)    Calcium 8.6 (*)    GFR, Estimated 32 (*)    All other components within normal limits  BASIC METABOLIC PANEL WITH GFR - Abnormal; Notable for the following components:   BUN 26 (*)    Creatinine, Ser 1.90 (*)    GFR, Estimated 33 (*)    All other components within normal limits  GLUCOSE, CAPILLARY - Abnormal; Notable for the following components:   Glucose-Capillary 102 (*)    All other components within normal limits  EXPECTORATED SPUTUM ASSESSMENT W GRAM STAIN, RFLX TO RESP C  CBC WITH DIFFERENTIAL/PLATELET  PHOSPHORUS  GLUCOSE, CAPILLARY  GLUCOSE, CAPILLARY  GLUCOSE, CAPILLARY    EKG: EKG Interpretation Date/Time:  Thursday  March 13 2024 09:30:31 EDT Ventricular Rate:  57 PR Interval:  195 QRS Duration:  108 QT Interval:  437 QTC Calculation: 426 R Axis:   24  Text Interpretation: Sinus rhythm Low voltage, precordial leads Abnormal R-wave progression, early transition Baseline wander in lead(s) V6 No significant change since last tracing Confirmed by Dean Clarity [phone]) on 03/14/2024 2:52:20 PM  Radiology: No results found.   Procedures   Medications Ordered in the ED  sodium chloride  0.9 % bolus 1,000 mL (0 mLs Intravenous Stopped 03/13/24 1139)  sodium chloride  0.9 % bolus 1,000 mL (1,000 mLs Intravenous New Bag/Given 03/13/24 1143)  sodium chloride   0.9 % bolus 1,000 mL (0 mLs Intravenous Stopped 03/14/24 1000)                                    Medical Decision Making Amount and/or Complexity of Data Reviewed Labs: ordered. Radiology: ordered. ECG/medicine tests: ordered.  Risk Decision regarding hospitalization.   Patient with radicular pain in her left arm and an AKI.  She will be admitted to medicine for hydration     Final diagnoses:  AKI (acute kidney injury) [facility])    ED Discharge Orders          Ordered    Discharge instructions       Comments: Continue oral hydration, adequate oral intake (preferred Pedialyte or Gatorade Zero) - Follow-up with PCP in 1 week, repeat BMP in 1 week report to PCP -Continue to hold all BP meds   03/14/24 1156    Activity as tolerated - No restrictions        03/14/24 1156    Increase activity slowly        03/14/24 1156    Diet - low sodium heart healthy        03/14/24 1156    Call MD for:  persistant nausea and vomiting        03/14/24 1156    Call MD for:  redness, tenderness, or signs of infection (pain, swelling, redness, odor or green/yellow discharge around incision site)        03/14/24 1156    Call MD for:  difficulty breathing, headache or visual disturbances        03/14/24 1156    Call MD for:  hives        03/14/24 1156     Call MD for:  persistant dizziness or light-headedness        03/14/24 1156    Call MD for:  extreme fatigue        03/14/24 1156    Ambulatory referral to Physical Therapy        03/13/24 1543               Suzette Pac, MD 03/16/24 1048

## 2024-03-17 ENCOUNTER — Telehealth: Payer: Self-pay

## 2024-03-17 ENCOUNTER — Other Ambulatory Visit: Payer: Self-pay | Admitting: Family Medicine

## 2024-03-17 MED ORDER — TIRZEPATIDE-WEIGHT MANAGEMENT 5 MG/0.5ML ~~LOC~~ SOLN: 5.0000 mg | SUBCUTANEOUS | 0 refills | Status: DC

## 2024-03-17 NOTE — Transitions of Care (Post Inpatient/ED Visit) (Unsigned)
   03/17/2024  Name: Mary [name] MRN: [id number] DOB: [birth year]  Today's TOC FU Call Status: Today's TOC FU Call Status:: Unsuccessful Call (1st Attempt) Unsuccessful Call (1st Attempt) Date: 03/17/24  Attempted to reach the patient regarding the most recent Inpatient/ED visit.  Follow Up Plan: Additional outreach attempts will be made to reach the patient to complete the Transitions of Care (Post Inpatient/ED visit) call.   Signature Julian Lemmings, LPN [facility] Nurse Health Advisor Direct Dial  [phone]

## 2024-03-18 ENCOUNTER — Other Ambulatory Visit: Payer: Self-pay | Admitting: Internal Medicine

## 2024-03-18 DIAGNOSIS — F411 Generalized anxiety disorder: Secondary | ICD-10-CM

## 2024-03-18 DIAGNOSIS — F331 Major depressive disorder, recurrent, moderate: Secondary | ICD-10-CM

## 2024-03-18 NOTE — Transitions of Care (Post Inpatient/ED Visit) (Signed)
 03/18/2024  Name: Mary [name] MRN: [id number] DOB: 01-05-1981  Today's TOC FU Call Status: Today's TOC FU Call Status:: Successful TOC FU Call Completed Unsuccessful Call (1st Attempt) Date: 03/17/24 [facility] FU Call Complete Date: 03/18/24 Patient's Name and Date of Birth confirmed.  Transition Care Management Follow-up Telephone Call Date of Discharge: 03/14/24 Discharge Facility: [facility]) Type of Discharge: Inpatient Admission Primary Inpatient Discharge Diagnosis:: AKF How have you been since you were released from the hospital?: Better Any questions or concerns?: No  Items Reviewed: Did you receive and understand the discharge instructions provided?: Yes Medications obtained,verified, and reconciled?: Yes (Medications Reviewed) Any new allergies since your discharge?: No Dietary orders reviewed?: Yes Do you have support at home?: Yes Name of Support/Comfort Primary Source: aid  Medications Reviewed Today: Medications Reviewed Today     Reviewed by Emmitt Pan, LPN (Licensed Practical Nurse) on 03/18/24 at 1419  Med List Status: <None>   Medication Order Taking? Sig Documenting Provider Last Dose Status Informant  albuterol  (VENTOLIN  HFA) 108 (90 Base) MCG/ACT inhaler [id number] Yes Inhale 2 puffs into the lungs every 6 (six) hours as needed for wheezing or shortness of breath. Cook, Jayce G, DO  Active Self, Pharmacy Records  busPIRone  (BUSPAR ) 7.5 MG tablet [id number] Yes TAKE 1 TABLET 2 TIMES A DAY Zarwolo, Gloria, FNP  Active   citalopram  (CELEXA ) 20 MG tablet [id number] Yes Take 1 tablet (20 mg total) by mouth daily. For depression Tobie Suzzane POUR, MD  Active Self, Pharmacy Records  naloxone [facility]) nasal spray 4 mg/0.1 mL [id number] Yes Place 1 spray into the nose once. [provider]  Active Self, Pharmacy Records  omeprazole  (PRILOSEC) 40 MG capsule [id number] Yes Take 1 capsule (40 mg total) by mouth daily.  Patient  taking differently: Take 40 mg by mouth daily as needed (heartburn, acid reflux).   Zarwolo, Gloria, FNP  Active Self, Pharmacy Records  oxybutynin  (DITROPAN -XL) 10 MG 24 hr tablet [id number] Yes Take 1 tablet (10 mg total) by mouth at bedtime. Gaston Keymiah Lyles, MD  Active Self, Pharmacy Records  oxyCODONE  (OXY IR/ROXICODONE ) 5 MG immediate release tablet [id number] Yes Take 5 mg by mouth every 4 (four) hours as needed for severe pain (pain score 7-10). [provider]  Active Self, Pharmacy Records  tirzepatide  5 MG/0.5ML injection vial [id number] Yes Inject 5 mg into the skin once a week. Zarwolo, Gloria, FNP  Active   Vitamin D , Ergocalciferol , (DRISDOL ) 1.25 MG (50000 UNIT) CAPS capsule [id number] Yes Take 1 capsule (50,000 Units total) by mouth every 7 (seven) days.  Patient taking differently: Take 50,000 Units by mouth every Sunday.   Zarwolo, Gloria, FNP  Active Self, Pharmacy Records            Home Care and Equipment/Supplies: Were Home Health Services Ordered?: NA Any new equipment or medical supplies ordered?: NA  Functional Questionnaire: Do you need assistance with bathing/showering or dressing?: No Do you need assistance with meal preparation?: No Do you need assistance with eating?: No Do you have difficulty maintaining continence: No Do you need assistance with getting out of bed/getting out of a chair/moving?: No Do you have difficulty managing or taking your medications?: No  Follow up appointments reviewed: PCP Follow-up appointment confirmed?: Yes Date of PCP follow-up appointment?: 03/31/24 Follow-up Provider[facility] Follow-up appointment confirmed?: NA Do you need transportation to your follow-up appointment?: No Do you understand care options if your condition(s) worsen?: Yes-patient verbalized understanding    SIGNATURE  Julian Lemmings, LPN [facility] Nurse Health Advisor Direct Dial  [phone]

## 2024-03-19 ENCOUNTER — Ambulatory Visit (HOSPITAL_COMMUNITY)
Admission: RE | Admit: 2024-03-19 | Discharge: 2024-03-19 | Disposition: A | Discharge status: Home / Self Care | Source: Ambulatory Visit | Attending: Adult Health | Admitting: Adult Health

## 2024-03-19 ENCOUNTER — Ambulatory Visit (INDEPENDENT_AMBULATORY_CARE_PROVIDER_SITE_OTHER)

## 2024-03-19 VITALS — BP 88/63

## 2024-03-19 DIAGNOSIS — I959 Hypotension, unspecified: Secondary | ICD-10-CM | POA: Diagnosis not present

## 2024-03-19 DIAGNOSIS — Z1231 Encounter for screening mammogram for malignant neoplasm of breast: Secondary | ICD-10-CM | POA: Diagnosis not present

## 2024-03-19 NOTE — Progress Notes (Signed)
 Patient is in office today for a nurse visit for Blood Pressure Check. Patient blood pressure was 88/63, Patient No chest pain

## 2024-03-24 ENCOUNTER — Ambulatory Visit: Payer: Self-pay | Admitting: Adult Health

## 2024-03-27 DIAGNOSIS — Z09 Encounter for follow-up examination after completed treatment for conditions other than malignant neoplasm: Secondary | ICD-10-CM | POA: Diagnosis not present

## 2024-03-27 DIAGNOSIS — M47816 Spondylosis without myelopathy or radiculopathy, lumbar region: Secondary | ICD-10-CM | POA: Diagnosis not present

## 2024-03-27 DIAGNOSIS — Z6841 Body Mass Index (BMI) 40.0 and over, adult: Secondary | ICD-10-CM | POA: Diagnosis not present

## 2024-03-27 DIAGNOSIS — N179 Acute kidney failure, unspecified: Secondary | ICD-10-CM | POA: Diagnosis not present

## 2024-03-27 DIAGNOSIS — I10 Essential (primary) hypertension: Secondary | ICD-10-CM | POA: Diagnosis not present

## 2024-03-27 DIAGNOSIS — Z79899 Other long term (current) drug therapy: Secondary | ICD-10-CM | POA: Diagnosis not present

## 2024-03-31 ENCOUNTER — Encounter: Payer: Self-pay | Admitting: Family Medicine

## 2024-03-31 ENCOUNTER — Ambulatory Visit: Admitting: Family Medicine

## 2024-04-01 DIAGNOSIS — Z419 Encounter for procedure for purposes other than remedying health state, unspecified: Secondary | ICD-10-CM | POA: Diagnosis not present

## 2024-04-09 ENCOUNTER — Encounter: Payer: Self-pay | Admitting: Adult Health

## 2024-04-09 ENCOUNTER — Ambulatory Visit: Admitting: Adult Health

## 2024-04-09 ENCOUNTER — Other Ambulatory Visit (HOSPITAL_COMMUNITY)
Admission: RE | Admit: 2024-04-09 | Discharge: 2024-04-09 | Disposition: A | Discharge status: Home / Self Care | Source: Ambulatory Visit | Attending: Adult Health | Admitting: Adult Health

## 2024-04-09 VITALS — BP 114/77 | HR 56 | Ht 69.0 in | Wt 318.0 lb

## 2024-04-09 DIAGNOSIS — Z124 Encounter for screening for malignant neoplasm of cervix: Secondary | ICD-10-CM | POA: Insufficient documentation

## 2024-04-09 DIAGNOSIS — Z01419 Encounter for gynecological examination (general) (routine) without abnormal findings: Secondary | ICD-10-CM | POA: Insufficient documentation

## 2024-04-09 DIAGNOSIS — K602 Anal fissure, unspecified: Secondary | ICD-10-CM

## 2024-04-09 DIAGNOSIS — Z1151 Encounter for screening for human papillomavirus (HPV): Secondary | ICD-10-CM | POA: Insufficient documentation

## 2024-04-09 DIAGNOSIS — Z90710 Acquired absence of both cervix and uterus: Secondary | ICD-10-CM | POA: Diagnosis not present

## 2024-04-09 DIAGNOSIS — Z08 Encounter for follow-up examination after completed treatment for malignant neoplasm: Secondary | ICD-10-CM | POA: Insufficient documentation

## 2024-04-09 DIAGNOSIS — Z8541 Personal history of malignant neoplasm of cervix uteri: Secondary | ICD-10-CM | POA: Diagnosis not present

## 2024-04-09 MED ORDER — HYDROCORTISONE ACE-PRAMOXINE 1-1 % EX CREA: 1.0000 | TOPICAL_CREAM | Freq: Two times a day (BID) | CUTANEOUS | 1 refills | Status: DC

## 2024-04-09 NOTE — Progress Notes (Signed)
 Patient ID: Mary [name], female   DOB: 16-Dec-1980, 43 y.o.   MRN: [id number] History of Present Illness: Mary [name] is a 43 year old white female, divorced, sp hysterectomy,  in for a well woman gyn exam and pap. She has painful spot near rectum, wonders if tear, has had some constipation. She was in hospital in July for AKI. She has lost 40 lbs taking Zepbound .   PCP is Mary Zarwolo NP Current Medications, Allergies, Past Medical History, Past Surgical History, Family History and Social History were reviewed in [employer] record.     Review of Systems: Patient denies any headaches, hearing loss, fatigue, blurred vision, shortness of breath, chest pain, abdominal pain, problems with bowel movements(taking stool softner now), urination, or intercourse(not active). No joint pain or mood swings.  See HPI for positives    Physical Exam:BP 114/77 (BP Location: Right Arm, Patient Position: Sitting, Cuff Size: Normal)   Pulse (!) 56   Ht 5' 9 (1.753 m)   Wt (!) 318 lb (144.2 kg)   BMI 46.96 kg/m   General:  Well developed, well nourished, no acute distress Skin:  Warm and dry Neck:  Midline trachea, normal thyroid , good ROM, no lymphadenopathy Lungs; Clear to auscultation bilaterally Breast:  No dominant palpable mass, retraction, or nipple discharge Cardiovascular: Regular rate and rhythm Abdomen:  Soft, non tender, no hepatosplenomegaly Pelvic:  External genitalia is normal in appearance, no lesions.  The vagina is normal in appearance, no lesions, vaginal pap with HR HPV genotyping performed. Urethra has no lesions or masses. The cervix  and uterus are absent. No adnexal masses or tenderness noted.Bladder is non tender, no masses felt. Rectal: Deferred, has fissure Extremities/musculoskeletal:  No swelling or varicosities noted, no clubbing or cyanosis Psych:  No mood changes, alert and cooperative,seems happy AA is 0 Fall risk Is moderate    04/09/2024    10:56 AM 02/18/2024   10:22 AM 02/18/2024   10:21 AM  Depression screen PHQ 2/9  Decreased Interest 0 0 0  Down, Depressed, Hopeless 0 0 0  PHQ - 2 Score 0 0 0  Altered sleeping 0 0 0  Tired, decreased energy 0 0 0  Change in appetite 0 0 0  Feeling bad or failure about yourself  0 0 0  Trouble concentrating 0 0 0  Moving slowly or fidgety/restless 0 0 0  Suicidal thoughts 0 0 0  PHQ-9 Score 0 0 0  Difficult doing work/chores  Not difficult at all Not difficult at all       04/09/2024   10:57 AM 02/15/2024   11:03 AM 02/01/2024   11:11 AM 01/17/2024    1:50 PM  GAD 7 : Generalized Anxiety Score  Nervous, Anxious, on Edge 0 0 2 3  Control/stop worrying 0 0 1 3  Worry too much - different things 0 0 3 3  Trouble relaxing 0 0 2 3  Restless 0 0 2 3  Easily annoyed or irritable 0 0 1 3  Afraid - awful might happen 0 0 0 3  Total GAD 7 Score 0 0 11 21  Anxiety Difficulty  Not difficult at all Somewhat difficult Not difficult at all    Upstream - 04/09/24 1051       Pregnancy Intention Screening   Does the patient want to become pregnant in the next year? N/A    Does the patient's partner want to become pregnant in the next year? N/A    Would  the patient like to discuss contraceptive options today? N/A      Contraception Wrap Up   Current Method Female Sterilization   hyst   End Method Female Sterilization   hyst   Contraception Counseling Provided No           Examination chaperoned by Clarita Salt LPN   Impression and plan: 1. Encounter for well woman exam with routine gynecological exam (Primary) Pap sent  Pap and physical in 1 year Labs with PCP Mammogram was negative 03/19/24 Colonoscopy 2027   - Cytology - PAP( [place])  2. S/P hysterectomy - Cytology - PAP( [place])  3. History of cervical cancer Pap sent  - Cytology - PAP( [place])  4. Vaginal Pap smear following hysterectomy for malignancy Pap sent Pap in 1 year - Cytology - PAP( [facility])  5. Rectal fissure Will rx proctocream HC Use stool softner Meds ordered this encounter  Medications   pramoxine-hydrocortisone  (PROCTOCREAM-HC) 1-1 % rectal cream    Sig: Place 1 Application rectally 2 (two) times daily.    Dispense:  30 g    Refill:  1    Supervising Provider:   JAYNE MINDER H [[id number]]    Follow up if not better

## 2024-04-11 LAB — CYTOLOGY - PAP
Comment: NEGATIVE
Diagnosis: NEGATIVE
High risk HPV: NEGATIVE

## 2024-04-12 ENCOUNTER — Other Ambulatory Visit: Payer: Self-pay | Admitting: Family Medicine

## 2024-04-12 DIAGNOSIS — I10 Essential (primary) hypertension: Secondary | ICD-10-CM

## 2024-04-14 ENCOUNTER — Ambulatory Visit: Payer: Self-pay | Admitting: Adult Health

## 2024-04-14 DIAGNOSIS — Z6841 Body Mass Index (BMI) 40.0 and over, adult: Secondary | ICD-10-CM | POA: Diagnosis not present

## 2024-04-14 DIAGNOSIS — M51362 Other intervertebral disc degeneration, lumbar region with discogenic back pain and lower extremity pain: Secondary | ICD-10-CM | POA: Diagnosis not present

## 2024-04-14 DIAGNOSIS — G4733 Obstructive sleep apnea (adult) (pediatric): Secondary | ICD-10-CM | POA: Diagnosis not present

## 2024-04-14 DIAGNOSIS — F411 Generalized anxiety disorder: Secondary | ICD-10-CM | POA: Diagnosis not present

## 2024-04-14 MED ORDER — METRONIDAZOLE 0.75 % VA GEL: 1.0000 | Freq: Every day | VAGINAL | 0 refills | Status: DC

## 2024-04-18 ENCOUNTER — Ambulatory Visit: Payer: Self-pay

## 2024-04-18 ENCOUNTER — Telehealth: Payer: Self-pay

## 2024-04-18 ENCOUNTER — Other Ambulatory Visit: Payer: Self-pay | Admitting: Family Medicine

## 2024-04-18 ENCOUNTER — Telehealth: Payer: Self-pay | Admitting: Family Medicine

## 2024-04-18 MED ORDER — TIRZEPATIDE-WEIGHT MANAGEMENT 7.5 MG/0.5ML ~~LOC~~ SOLN: 7.5000 mg | SUBCUTANEOUS | 0 refills | Status: DC

## 2024-04-18 NOTE — Telephone Encounter (Signed)
 Clarified it should have been for the pen needle instead of vial. Pharmacy aware

## 2024-04-18 NOTE — Telephone Encounter (Signed)
 Copied from CRM [id number]. Topic: Clinical - Prescription Issue >> Apr 18, 2024 11:55 AM Annabella S wrote: Reason for CRM: tirzepatide  7.5 MG/0.5ML injection vial [[id number]] Pharmacy needs clarification on medication   [phone] [facility] pharmacy

## 2024-04-18 NOTE — Telephone Encounter (Signed)
 FYI Only or Action Required?: FYI only for provider.  Patient was last seen in primary care on 02/18/2024 by Zarwolo, Gloria, FNP.  Called Nurse Triage reporting Dizziness.  Symptoms began about a month ago.  Interventions attempted: Nothing.  Symptoms are: unchanged.  Triage Disposition: See Physician Within 24 Hours  Patient/caregiver understands and will follow disposition?: No, refuses disposition    Copied from CRM #[id number]. Topic: Clinical - Red Word Triage >> Apr 18, 2024  8:52 AM Annabella S wrote: Red Word that prompted transfer to Nurse Triage: Patient experiencing dizziness and lightheaded when moving around Reason for Disposition  [1] MODERATE dizziness (e.g., interferes with normal activities) AND [2] has NOT been evaluated by doctor (or NP/PA) for this  (Exception: Dizziness caused by heat exposure, sudden standing, or poor fluid intake.)  Answer Assessment - Initial Assessment Questions 1. DESCRIPTION: Describe your dizziness.     Light headed and dizzy 2. LIGHTHEADED: Do you feel lightheaded? (e.g., somewhat faint, woozy, weak upon standing)     When trying to get up, bending over 3. VERTIGO: Do you feel like either you or the room is spinning or tilting? (i.e., vertigo)     spinning 4. SEVERITY: How bad is it?  Do you feel like you are going to faint? Can you stand and walk?     Can walk 5. ONSET:  When did the dizziness begin?     On going for about a month 6. AGGRAVATING FACTORS: Does anything make it worse? (e.g., standing, change in head position)     See above 7. HEART RATE: Can you tell me your heart rate? How many beats in 15 seconds?  (Note: Not all patients can do this.)       no 8. CAUSE: What do you think is causing the dizziness? (e.g., decreased fluids or food, diarrhea, emotional distress, heat exposure, new medicine, sudden standing, vomiting; unknown)     unknown 9. RECURRENT SYMPTOM: Have you had dizziness before? If Yes,  ask: When was the last time? What happened that time?     Yes 10. OTHER SYMPTOMS: Do you have any other symptoms? (e.g., fever, chest pain, vomiting, diarrhea, bleeding)       denies 11. PREGNANCY: Is there any chance you are pregnant? When was your last menstrual period?       na  Protocols used: Dizziness - Lightheadedness-A-AH

## 2024-04-18 NOTE — Telephone Encounter (Unsigned)
 Copied from CRM #[id number]. Topic: Clinical - Medication Refill >> Apr 18, 2024  8:47 AM Tiffany S wrote: Medication: ZEPBOUND  5 MG/0.5ML Pen [Pharmacy Med Name: Zepbound  5 mg/0.5 mL subcutaneous pen injector] [[id number]]  Has the patient contacted their pharmacy? Yes (Agent: If no, request that the patient contact the pharmacy for the refill. If patient does not wish to contact the pharmacy document the reason why and proceed with request.) (Agent: If yes, when and what did the pharmacy advise?)  This is the patient's preferred pharmacy:  [facility] [place], [place] - [address] [place] [address] Phone: [phone] Fax: [phone]   Is this the correct pharmacy for this prescription? Yes If no, delete pharmacy and type the correct one.   Has the prescription been filled recently? Yes  Is the patient out of the medication? Yes  Has the patient been seen for an appointment in the last year OR does the patient have an upcoming appointment? Yes  Can we respond through MyChart? Yes  Agent: Please be advised that Rx refills may take up to 3 business days. We ask that you follow-up with your pharmacy.

## 2024-04-18 NOTE — Telephone Encounter (Signed)
 Rx sent.

## 2024-04-25 DIAGNOSIS — N179 Acute kidney failure, unspecified: Secondary | ICD-10-CM | POA: Diagnosis not present

## 2024-04-25 DIAGNOSIS — R001 Bradycardia, unspecified: Secondary | ICD-10-CM | POA: Diagnosis not present

## 2024-04-25 DIAGNOSIS — Z6841 Body Mass Index (BMI) 40.0 and over, adult: Secondary | ICD-10-CM | POA: Diagnosis not present

## 2024-04-25 DIAGNOSIS — M47816 Spondylosis without myelopathy or radiculopathy, lumbar region: Secondary | ICD-10-CM | POA: Diagnosis not present

## 2024-04-25 DIAGNOSIS — Z79899 Other long term (current) drug therapy: Secondary | ICD-10-CM | POA: Diagnosis not present

## 2024-05-02 DIAGNOSIS — Z419 Encounter for procedure for purposes other than remedying health state, unspecified: Secondary | ICD-10-CM | POA: Diagnosis not present

## 2024-05-15 ENCOUNTER — Other Ambulatory Visit: Payer: Self-pay | Admitting: Family Medicine

## 2024-05-15 DIAGNOSIS — E559 Vitamin D deficiency, unspecified: Secondary | ICD-10-CM

## 2024-05-15 DIAGNOSIS — I129 Hypertensive chronic kidney disease with stage 1 through stage 4 chronic kidney disease, or unspecified chronic kidney disease: Secondary | ICD-10-CM | POA: Diagnosis not present

## 2024-05-15 DIAGNOSIS — F411 Generalized anxiety disorder: Secondary | ICD-10-CM

## 2024-05-15 DIAGNOSIS — F319 Bipolar disorder, unspecified: Secondary | ICD-10-CM | POA: Diagnosis not present

## 2024-05-15 DIAGNOSIS — F331 Major depressive disorder, recurrent, moderate: Secondary | ICD-10-CM

## 2024-05-15 DIAGNOSIS — N179 Acute kidney failure, unspecified: Secondary | ICD-10-CM | POA: Diagnosis not present

## 2024-05-23 DIAGNOSIS — Z6841 Body Mass Index (BMI) 40.0 and over, adult: Secondary | ICD-10-CM | POA: Diagnosis not present

## 2024-05-23 DIAGNOSIS — F411 Generalized anxiety disorder: Secondary | ICD-10-CM | POA: Diagnosis not present

## 2024-05-23 DIAGNOSIS — I10 Essential (primary) hypertension: Secondary | ICD-10-CM | POA: Diagnosis not present

## 2024-05-23 DIAGNOSIS — F331 Major depressive disorder, recurrent, moderate: Secondary | ICD-10-CM | POA: Diagnosis not present

## 2024-05-23 DIAGNOSIS — G4733 Obstructive sleep apnea (adult) (pediatric): Secondary | ICD-10-CM | POA: Diagnosis not present

## 2024-05-23 DIAGNOSIS — J4520 Mild intermittent asthma, uncomplicated: Secondary | ICD-10-CM | POA: Diagnosis not present

## 2024-05-30 DIAGNOSIS — R030 Elevated blood-pressure reading, without diagnosis of hypertension: Secondary | ICD-10-CM | POA: Diagnosis not present

## 2024-05-30 DIAGNOSIS — Z6841 Body Mass Index (BMI) 40.0 and over, adult: Secondary | ICD-10-CM | POA: Diagnosis not present

## 2024-05-30 DIAGNOSIS — N179 Acute kidney failure, unspecified: Secondary | ICD-10-CM | POA: Diagnosis not present

## 2024-05-30 DIAGNOSIS — F411 Generalized anxiety disorder: Secondary | ICD-10-CM | POA: Diagnosis not present

## 2024-05-30 DIAGNOSIS — R001 Bradycardia, unspecified: Secondary | ICD-10-CM | POA: Diagnosis not present

## 2024-05-30 DIAGNOSIS — M47816 Spondylosis without myelopathy or radiculopathy, lumbar region: Secondary | ICD-10-CM | POA: Diagnosis not present

## 2024-05-30 DIAGNOSIS — Z79899 Other long term (current) drug therapy: Secondary | ICD-10-CM | POA: Diagnosis not present

## 2024-06-04 ENCOUNTER — Encounter (INDEPENDENT_AMBULATORY_CARE_PROVIDER_SITE_OTHER): Payer: Self-pay | Admitting: Gastroenterology

## 2024-06-24 DIAGNOSIS — F411 Generalized anxiety disorder: Secondary | ICD-10-CM | POA: Diagnosis not present

## 2024-06-24 DIAGNOSIS — F331 Major depressive disorder, recurrent, moderate: Secondary | ICD-10-CM | POA: Diagnosis not present

## 2024-06-24 DIAGNOSIS — G4733 Obstructive sleep apnea (adult) (pediatric): Secondary | ICD-10-CM | POA: Diagnosis not present

## 2024-06-24 DIAGNOSIS — J4520 Mild intermittent asthma, uncomplicated: Secondary | ICD-10-CM | POA: Diagnosis not present

## 2024-06-24 DIAGNOSIS — N1832 Chronic kidney disease, stage 3b: Secondary | ICD-10-CM | POA: Diagnosis not present

## 2024-06-24 DIAGNOSIS — Z6841 Body Mass Index (BMI) 40.0 and over, adult: Secondary | ICD-10-CM | POA: Diagnosis not present

## 2024-06-24 DIAGNOSIS — I10 Essential (primary) hypertension: Secondary | ICD-10-CM | POA: Diagnosis not present

## 2024-06-25 DIAGNOSIS — N1832 Chronic kidney disease, stage 3b: Secondary | ICD-10-CM | POA: Diagnosis not present

## 2024-06-30 DIAGNOSIS — Z6841 Body Mass Index (BMI) 40.0 and over, adult: Secondary | ICD-10-CM | POA: Diagnosis not present

## 2024-06-30 DIAGNOSIS — Z79899 Other long term (current) drug therapy: Secondary | ICD-10-CM | POA: Diagnosis not present

## 2024-06-30 DIAGNOSIS — M47816 Spondylosis without myelopathy or radiculopathy, lumbar region: Secondary | ICD-10-CM | POA: Diagnosis not present

## 2024-07-04 DIAGNOSIS — Z79899 Other long term (current) drug therapy: Secondary | ICD-10-CM | POA: Diagnosis not present

## 2024-07-21 DIAGNOSIS — R059 Cough, unspecified: Secondary | ICD-10-CM | POA: Diagnosis not present

## 2024-07-21 DIAGNOSIS — J069 Acute upper respiratory infection, unspecified: Secondary | ICD-10-CM | POA: Diagnosis not present

## 2024-07-30 ENCOUNTER — Other Ambulatory Visit: Payer: Self-pay | Admitting: Family Medicine

## 2024-07-30 DIAGNOSIS — R110 Nausea: Secondary | ICD-10-CM

## 2024-08-01 DIAGNOSIS — Z419 Encounter for procedure for purposes other than remedying health state, unspecified: Secondary | ICD-10-CM | POA: Diagnosis not present

## 2024-08-04 DIAGNOSIS — F331 Major depressive disorder, recurrent, moderate: Secondary | ICD-10-CM | POA: Diagnosis not present

## 2024-08-04 DIAGNOSIS — Z6841 Body Mass Index (BMI) 40.0 and over, adult: Secondary | ICD-10-CM | POA: Diagnosis not present

## 2024-08-04 DIAGNOSIS — I10 Essential (primary) hypertension: Secondary | ICD-10-CM | POA: Diagnosis not present

## 2024-08-04 DIAGNOSIS — G4733 Obstructive sleep apnea (adult) (pediatric): Secondary | ICD-10-CM | POA: Diagnosis not present

## 2024-08-04 DIAGNOSIS — F411 Generalized anxiety disorder: Secondary | ICD-10-CM | POA: Diagnosis not present

## 2024-08-04 DIAGNOSIS — R161 Splenomegaly, not elsewhere classified: Secondary | ICD-10-CM | POA: Diagnosis not present

## 2024-08-04 DIAGNOSIS — N1832 Chronic kidney disease, stage 3b: Secondary | ICD-10-CM | POA: Diagnosis not present

## 2024-08-04 DIAGNOSIS — J4520 Mild intermittent asthma, uncomplicated: Secondary | ICD-10-CM | POA: Diagnosis not present

## 2024-08-06 ENCOUNTER — Telehealth: Payer: Self-pay

## 2024-08-06 ENCOUNTER — Other Ambulatory Visit (HOSPITAL_COMMUNITY): Payer: Self-pay

## 2024-08-06 DIAGNOSIS — M47816 Spondylosis without myelopathy or radiculopathy, lumbar region: Secondary | ICD-10-CM | POA: Diagnosis not present

## 2024-08-06 DIAGNOSIS — E559 Vitamin D deficiency, unspecified: Secondary | ICD-10-CM | POA: Diagnosis not present

## 2024-08-06 DIAGNOSIS — Z79899 Other long term (current) drug therapy: Secondary | ICD-10-CM | POA: Diagnosis not present

## 2024-08-06 DIAGNOSIS — Z6841 Body Mass Index (BMI) 40.0 and over, adult: Secondary | ICD-10-CM | POA: Diagnosis not present

## 2024-08-06 NOTE — Telephone Encounter (Signed)
 Pharmacy Patient Advocate Encounter   Received notification from CoverMyMeds that prior authorization for Zepbound  7.5mg /0.63ml is due for renewal.   Insurance verification completed.   The patient is insured through [facility] MEDICAID.  Action: PA required; PA started via CoverMyMeds. KEY [id number] . Waiting for clinical questions to populate.

## 2024-08-06 NOTE — Telephone Encounter (Signed)
 Clinical questions answered and PA submitted.

## 2024-08-06 NOTE — Telephone Encounter (Signed)
 Copied from CRM [id number]. Topic: Clinical - Medication Question >> Aug 06, 2024  3:18 PM Harlene ORN wrote: Reason for CRM:  [facility] [employer]  got a PA for Zepbound  for 7.5 MG. Two days ago she was filled for 12.5 MG on 12/15 at the [facility]. Is this a mistake or is the dosage meant to go down in dosage? Please advise.  Phone: [phone]

## 2024-08-07 NOTE — Telephone Encounter (Signed)
 The patients chart was reviewed, and there is no record indicating that the 12.5 mg dose was refilled. Please verify who refilled the dose mentioned.

## 2024-08-12 ENCOUNTER — Other Ambulatory Visit (HOSPITAL_COMMUNITY): Payer: Self-pay

## 2024-08-26 ENCOUNTER — Other Ambulatory Visit (HOSPITAL_COMMUNITY): Payer: Self-pay

## 2024-08-26 NOTE — Telephone Encounter (Signed)
 Pharmacy Patient Advocate Encounter  Insurance verification completed.   The patient is insured through [facility] MEDICAID   Ran test claim for Zepbound  7.5mg /0.39ml. Currently a quantity of 2ml is a 28 day supply and the co-pay is $4 .   This test claim was processed through [facility]- copay amounts may vary at other pharmacies due to pharmacy/plan contracts, or as the patient moves through the different stages of their insurance plan.   Per test claim, the prior authorization expires 12/10/2024
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
## 2024-09-01 ENCOUNTER — Ambulatory Visit (HOSPITAL_COMMUNITY): Admitting: Registered Nurse

## 2024-09-11 ENCOUNTER — Encounter: Payer: Self-pay | Admitting: Rheumatology

## 2024-09-11 ENCOUNTER — Ambulatory Visit: Admitting: Rheumatology

## 2024-09-11 VITALS — BP 121/84 | HR 64 | Temp 98.0°F | Resp 15 | Ht 67.0 in | Wt 291.0 lb

## 2024-09-11 DIAGNOSIS — R5383 Other fatigue: Secondary | ICD-10-CM | POA: Insufficient documentation

## 2024-09-11 DIAGNOSIS — Z8269 Family history of other diseases of the musculoskeletal system and connective tissue: Secondary | ICD-10-CM | POA: Insufficient documentation

## 2024-09-11 DIAGNOSIS — I10 Essential (primary) hypertension: Secondary | ICD-10-CM | POA: Insufficient documentation

## 2024-09-11 DIAGNOSIS — K219 Gastro-esophageal reflux disease without esophagitis: Secondary | ICD-10-CM | POA: Diagnosis not present

## 2024-09-11 DIAGNOSIS — R7689 Other specified abnormal immunological findings in serum: Secondary | ICD-10-CM | POA: Insufficient documentation

## 2024-09-11 DIAGNOSIS — G4709 Other insomnia: Secondary | ICD-10-CM | POA: Insufficient documentation

## 2024-09-11 DIAGNOSIS — M51362 Other intervertebral disc degeneration, lumbar region with discogenic back pain and lower extremity pain: Secondary | ICD-10-CM | POA: Diagnosis not present

## 2024-09-11 DIAGNOSIS — E119 Type 2 diabetes mellitus without complications: Secondary | ICD-10-CM | POA: Insufficient documentation

## 2024-09-11 DIAGNOSIS — F32A Depression, unspecified: Secondary | ICD-10-CM | POA: Insufficient documentation

## 2024-09-11 DIAGNOSIS — F419 Anxiety disorder, unspecified: Secondary | ICD-10-CM | POA: Diagnosis present

## 2024-09-11 DIAGNOSIS — F4310 Post-traumatic stress disorder, unspecified: Secondary | ICD-10-CM | POA: Insufficient documentation

## 2024-09-11 DIAGNOSIS — M24522 Contracture, left elbow: Secondary | ICD-10-CM | POA: Insufficient documentation

## 2024-09-11 DIAGNOSIS — N3946 Mixed incontinence: Secondary | ICD-10-CM | POA: Diagnosis not present

## 2024-09-11 DIAGNOSIS — I5032 Chronic diastolic (congestive) heart failure: Secondary | ICD-10-CM | POA: Diagnosis not present

## 2024-09-11 DIAGNOSIS — J410 Simple chronic bronchitis: Secondary | ICD-10-CM | POA: Diagnosis not present

## 2024-09-11 DIAGNOSIS — Z970 Presence of artificial eye: Secondary | ICD-10-CM | POA: Insufficient documentation

## 2024-09-11 DIAGNOSIS — G894 Chronic pain syndrome: Secondary | ICD-10-CM | POA: Insufficient documentation

## 2024-09-11 DIAGNOSIS — Z8541 Personal history of malignant neoplasm of cervix uteri: Secondary | ICD-10-CM | POA: Insufficient documentation

## 2024-09-11 DIAGNOSIS — G4733 Obstructive sleep apnea (adult) (pediatric): Secondary | ICD-10-CM | POA: Diagnosis present

## 2024-09-11 DIAGNOSIS — E559 Vitamin D deficiency, unspecified: Secondary | ICD-10-CM | POA: Insufficient documentation

## 2024-09-11 DIAGNOSIS — E8881 Metabolic syndrome: Secondary | ICD-10-CM | POA: Insufficient documentation

## 2024-09-11 DIAGNOSIS — Z90710 Acquired absence of both cervix and uterus: Secondary | ICD-10-CM | POA: Diagnosis present

## 2024-09-11 DIAGNOSIS — F1290 Cannabis use, unspecified, uncomplicated: Secondary | ICD-10-CM

## 2024-09-14 ENCOUNTER — Ambulatory Visit: Payer: Self-pay | Admitting: Rheumatology

## 2024-09-14 NOTE — Progress Notes (Signed)
 CBC normal, creatinine is elevated, sed rate is elevated, ANA negative, Smith antibody positive, double-stranded DNA positive, C3-C4 normal, beta-2 GP 1 and anticardiolipin antibodies positive, rheumatoid factor negative CK normal, TPMT, G6PD and anti-CCP pending.  Labs are suggestive of autoimmune disease.  I will discuss results at the follow-up visit.

## 2024-09-20 LAB — CBC WITH DIFFERENTIAL/PLATELET
Absolute Lymphocytes: 1537 {cells}/uL (ref 850–3900)
Absolute Monocytes: 321 {cells}/uL (ref 200–950)
Basophils Absolute: 32 {cells}/uL (ref 0–200)
Basophils Relative: 0.5 %
Eosinophils Absolute: 63 {cells}/uL (ref 15–500)
Eosinophils Relative: 1 %
HCT: 37.6 % (ref 35.9–46.0)
Hemoglobin: 12.2 g/dL (ref 11.7–15.5)
MCH: 28.8 pg (ref 27.0–33.0)
MCHC: 32.4 g/dL (ref 31.6–35.4)
MCV: 88.7 fL (ref 81.4–101.7)
MPV: 9.2 fL (ref 7.5–12.5)
Monocytes Relative: 5.1 %
Neutro Abs: 4347 {cells}/uL (ref 1500–7800)
Neutrophils Relative %: 69 %
Platelets: 214 10*3/uL (ref 140–400)
RBC: 4.24 Million/uL (ref 3.80–5.10)
RDW: 13.1 % (ref 11.0–15.0)
Total Lymphocyte: 24.4 %
WBC: 6.3 10*3/uL (ref 3.8–10.8)

## 2024-09-20 LAB — GLUCOSE 6 PHOSPHATE DEHYDROGENASE: G-6PDH: 15.4 U/g{Hb} (ref 7.0–20.5)

## 2024-09-20 LAB — COMPREHENSIVE METABOLIC PANEL WITH GFR
AG Ratio: 1.9 (calc) (ref 1.0–2.5)
ALT: 19 U/L (ref 6–29)
AST: 19 U/L (ref 10–35)
Albumin: 4.7 g/dL (ref 3.6–5.1)
Alkaline phosphatase (APISO): 109 U/L (ref 31–125)
BUN/Creatinine Ratio: 10 (calc) (ref 6–22)
BUN: 13 mg/dL (ref 7–25)
CO2: 30 mmol/L (ref 20–32)
Calcium: 9.6 mg/dL (ref 8.6–10.2)
Chloride: 104 mmol/L (ref 98–110)
Creat: 1.35 mg/dL — ABNORMAL HIGH (ref 0.50–0.99)
Globulin: 2.5 g/dL (ref 1.9–3.7)
Glucose, Bld: 86 mg/dL (ref 65–99)
Potassium: 4 mmol/L (ref 3.5–5.3)
Sodium: 143 mmol/L (ref 135–146)
Total Bilirubin: 0.8 mg/dL (ref 0.2–1.2)
Total Protein: 7.2 g/dL (ref 6.1–8.1)
eGFR: 49 mL/min/{1.73_m2} — ABNORMAL LOW

## 2024-09-20 LAB — C3 AND C4
C3 Complement: 158 mg/dL (ref 83–193)
C4 Complement: 34 mg/dL (ref 15–57)

## 2024-09-20 LAB — SEDIMENTATION RATE: Sed Rate: 41 mm/h — ABNORMAL HIGH (ref 0–20)

## 2024-09-20 LAB — BETA-2 GLYCOPROTEIN ANTIBODIES
Beta-2 Glyco 1 IgA: 6.1 U/mL
Beta-2 Glyco 1 IgM: 20.7 U/mL — ABNORMAL HIGH
Beta-2 Glyco I IgG: 3 U/mL

## 2024-09-20 LAB — PROTEIN / CREATININE RATIO, URINE
Creatinine, Urine: 405 mg/dL — ABNORMAL HIGH (ref 20–275)
Protein/Creat Ratio: 62 mg/g{creat} (ref 24–184)
Protein/Creatinine Ratio: 0.062 mg/mg{creat} (ref 0.024–0.184)
Total Protein, Urine: 25 mg/dL — ABNORMAL HIGH (ref 5–24)

## 2024-09-20 LAB — ANTI-DNA ANTIBODY, DOUBLE-STRANDED: ds DNA Ab: 28 [IU]/mL — ABNORMAL HIGH

## 2024-09-20 LAB — CARDIOLIPIN ANTIBODIES, IGG, IGM, IGA
Anticardiolipin IgA: 7.1 [APL'U]/mL
Anticardiolipin IgG: 2.8 [GPL'U]/mL
Anticardiolipin IgM: 22.5 [MPL'U]/mL — ABNORMAL HIGH

## 2024-09-20 LAB — THIOPURINE METHYLTRANSFERASE (TPMT), RBC: Thiopurine Methyltransferase, RBC: 8 nmol/h/mL — ABNORMAL LOW

## 2024-09-20 LAB — ANTI-SMITH ANTIBODY: ENA SM Ab Ser-aCnc: 2.7 AI — AB

## 2024-09-20 LAB — RHEUMATOID FACTOR: Rheumatoid fact SerPl-aCnc: <10 [IU]/mL

## 2024-09-20 LAB — CYCLIC CITRUL PEPTIDE ANTIBODY, IGG: Cyclic Citrullin Peptide Ab: <16 U

## 2024-09-20 LAB — ANA: Anti Nuclear Antibody (ANA): NEGATIVE

## 2024-09-20 LAB — CK: Total CK: 28 U/L (ref 20–239)

## 2024-10-14 ENCOUNTER — Ambulatory Visit: Admitting: Rheumatology
